# Patient Record
Sex: Male | Born: 1935 | Race: White | Hispanic: No | Marital: Married | Smoking: Former smoker
Health system: Southern US, Community
[De-identification: ages and names within clinical notes are randomized; demographics above are authoritative.]

## PROBLEM LIST (undated history)

## (undated) DIAGNOSIS — F329 Major depressive disorder, single episode, unspecified: Secondary | ICD-10-CM

## (undated) DIAGNOSIS — I442 Atrioventricular block, complete: Secondary | ICD-10-CM

## (undated) DIAGNOSIS — H25019 Cortical age-related cataract, unspecified eye: Secondary | ICD-10-CM

## (undated) DIAGNOSIS — G629 Polyneuropathy, unspecified: Secondary | ICD-10-CM

## (undated) DIAGNOSIS — G20 Parkinson's disease: Secondary | ICD-10-CM

## (undated) DIAGNOSIS — M2040 Other hammer toe(s) (acquired), unspecified foot: Secondary | ICD-10-CM

## (undated) DIAGNOSIS — G259 Extrapyramidal and movement disorder, unspecified: Secondary | ICD-10-CM

## (undated) DIAGNOSIS — G609 Hereditary and idiopathic neuropathy, unspecified: Secondary | ICD-10-CM

## (undated) DIAGNOSIS — M1990 Unspecified osteoarthritis, unspecified site: Secondary | ICD-10-CM

## (undated) DIAGNOSIS — F419 Anxiety disorder, unspecified: Secondary | ICD-10-CM

## (undated) DIAGNOSIS — R251 Tremor, unspecified: Secondary | ICD-10-CM

## (undated) DIAGNOSIS — R413 Other amnesia: Secondary | ICD-10-CM

## (undated) DIAGNOSIS — K219 Gastro-esophageal reflux disease without esophagitis: Secondary | ICD-10-CM

## (undated) DIAGNOSIS — Z8659 Personal history of other mental and behavioral disorders: Secondary | ICD-10-CM

## (undated) DIAGNOSIS — I82409 Acute embolism and thrombosis of unspecified deep veins of unspecified lower extremity: Secondary | ICD-10-CM

## (undated) DIAGNOSIS — M4800 Spinal stenosis, site unspecified: Secondary | ICD-10-CM

## (undated) DIAGNOSIS — M659 Unspecified synovitis and tenosynovitis, unspecified site: Secondary | ICD-10-CM

## (undated) DIAGNOSIS — I10 Essential (primary) hypertension: Secondary | ICD-10-CM

## (undated) DIAGNOSIS — Z5189 Encounter for other specified aftercare: Secondary | ICD-10-CM

## (undated) DIAGNOSIS — F32A Depression, unspecified: Secondary | ICD-10-CM

## (undated) DIAGNOSIS — B019 Varicella without complication: Secondary | ICD-10-CM

## (undated) DIAGNOSIS — G20A1 Parkinson's disease without dyskinesia, without mention of fluctuations: Secondary | ICD-10-CM

## (undated) DIAGNOSIS — Z950 Presence of cardiac pacemaker: Secondary | ICD-10-CM

## (undated) DIAGNOSIS — IMO0001 Reserved for inherently not codable concepts without codable children: Secondary | ICD-10-CM

## (undated) DIAGNOSIS — R0602 Shortness of breath: Secondary | ICD-10-CM

## (undated) DIAGNOSIS — G709 Myoneural disorder, unspecified: Secondary | ICD-10-CM

## (undated) DIAGNOSIS — D689 Coagulation defect, unspecified: Secondary | ICD-10-CM

## (undated) DIAGNOSIS — G3184 Mild cognitive impairment, so stated: Secondary | ICD-10-CM

## (undated) HISTORY — DX: Varicella without complication: B01.9

## (undated) HISTORY — DX: Extrapyramidal and movement disorder, unspecified: G25.9

## (undated) HISTORY — DX: Coagulation defect, unspecified: D68.9

## (undated) HISTORY — DX: Essential (primary) hypertension: I10

## (undated) HISTORY — PX: REPLACEMENT TOTAL HIP W/  RESURFACING IMPLANTS: SUR1222

## (undated) HISTORY — DX: Unspecified synovitis and tenosynovitis, unspecified site: M65.90

## (undated) HISTORY — DX: Other hammer toe(s) (acquired), unspecified foot: M20.40

## (undated) HISTORY — DX: Cortical age-related cataract, unspecified eye: H25.019

## (undated) HISTORY — DX: Major depressive disorder, single episode, unspecified: F32.9

## (undated) HISTORY — DX: Personal history of other mental and behavioral disorders: Z86.59

## (undated) HISTORY — DX: Atrioventricular block, complete: I44.2

## (undated) HISTORY — DX: Parkinson's disease: G20

## (undated) HISTORY — DX: Polyneuropathy, unspecified: G62.9

## (undated) HISTORY — DX: Hereditary and idiopathic neuropathy, unspecified: G60.9

## (undated) HISTORY — DX: Depression, unspecified: F32.A

## (undated) HISTORY — DX: Anxiety disorder, unspecified: F41.9

## (undated) HISTORY — DX: Spinal stenosis, site unspecified: M48.00

## (undated) HISTORY — DX: Mild cognitive impairment, so stated: G31.84

## (undated) HISTORY — DX: Other amnesia: R41.3

## (undated) HISTORY — DX: Synovitis and tenosynovitis, unspecified: M65.9

## (undated) HISTORY — DX: Parkinson's disease without dyskinesia, without mention of fluctuations: G20.A1

## (undated) HISTORY — DX: Gastro-esophageal reflux disease without esophagitis: K21.9

## (undated) HISTORY — PX: VASECTOMY: SHX75

## (undated) HISTORY — PX: KNEE ARTHROSCOPY: SUR90

## (undated) HISTORY — PX: JOINT REPLACEMENT: SHX530

## (undated) HISTORY — DX: Tremor, unspecified: R25.1

## (undated) HISTORY — PX: KNEE SURGERY: SHX244

## (undated) HISTORY — PX: CORRECTION HAMMER TOE: SUR317

## (undated) HISTORY — DX: Unspecified osteoarthritis, unspecified site: M19.90

## (undated) HISTORY — DX: Acute embolism and thrombosis of unspecified deep veins of unspecified lower extremity: I82.409

---

## 1995-12-13 HISTORY — PX: REPLACEMENT TOTAL KNEE BILATERAL: SUR1225

## 1999-12-13 HISTORY — PX: UMBILICAL HERNIA REPAIR: SUR1181

## 2005-06-08 ENCOUNTER — Ambulatory Visit: Payer: Self-pay | Admitting: Ophthalmology

## 2005-06-11 HISTORY — PX: CATARACT EXTRACTION: SUR2

## 2005-06-13 ENCOUNTER — Ambulatory Visit: Payer: Self-pay | Admitting: Ophthalmology

## 2005-08-01 ENCOUNTER — Ambulatory Visit: Payer: Self-pay | Admitting: Ophthalmology

## 2005-08-08 ENCOUNTER — Ambulatory Visit: Payer: Self-pay | Admitting: Ophthalmology

## 2006-06-13 ENCOUNTER — Ambulatory Visit: Payer: Self-pay | Admitting: Gastroenterology

## 2006-06-13 HISTORY — PX: COLONOSCOPY: SHX174

## 2007-12-13 HISTORY — PX: TOTAL HIP ARTHROPLASTY: SHX124

## 2008-02-28 ENCOUNTER — Ambulatory Visit: Payer: Self-pay | Admitting: Orthopaedic Surgery

## 2008-06-11 HISTORY — PX: ROTATOR CUFF REPAIR: SHX139

## 2008-06-12 ENCOUNTER — Ambulatory Visit: Payer: Self-pay | Admitting: Orthopaedic Surgery

## 2008-06-12 ENCOUNTER — Other Ambulatory Visit: Payer: Self-pay

## 2008-06-19 ENCOUNTER — Ambulatory Visit: Payer: Self-pay | Admitting: Orthopaedic Surgery

## 2008-06-19 HISTORY — PX: SHOULDER ARTHROSCOPY: SHX128

## 2008-12-12 HISTORY — PX: ROTATOR CUFF REPAIR: SHX139

## 2009-01-02 ENCOUNTER — Encounter: Payer: Self-pay | Admitting: Internal Medicine

## 2009-01-06 ENCOUNTER — Ambulatory Visit: Payer: Self-pay | Admitting: Internal Medicine

## 2009-01-12 ENCOUNTER — Encounter: Payer: Self-pay | Admitting: Internal Medicine

## 2009-01-19 ENCOUNTER — Encounter: Payer: Self-pay | Admitting: Internal Medicine

## 2009-02-09 ENCOUNTER — Encounter: Payer: Self-pay | Admitting: Internal Medicine

## 2010-12-20 ENCOUNTER — Encounter: Payer: Self-pay | Admitting: Cardiovascular Disease

## 2010-12-23 ENCOUNTER — Encounter: Payer: Self-pay | Admitting: Cardiovascular Disease

## 2011-01-07 ENCOUNTER — Ambulatory Visit: Payer: Self-pay | Admitting: Podiatry

## 2011-01-18 ENCOUNTER — Encounter: Payer: Self-pay | Admitting: Cardiovascular Disease

## 2011-03-24 ENCOUNTER — Encounter: Payer: Self-pay | Admitting: Cardiovascular Disease

## 2011-03-24 ENCOUNTER — Ambulatory Visit (INDEPENDENT_AMBULATORY_CARE_PROVIDER_SITE_OTHER): Payer: Medicare Other | Admitting: Cardiovascular Disease

## 2011-03-24 VITALS — BP 124/78 | HR 80 | Ht 71.0 in | Wt 201.8 lb

## 2011-03-24 DIAGNOSIS — R413 Other amnesia: Secondary | ICD-10-CM | POA: Insufficient documentation

## 2011-03-24 DIAGNOSIS — F329 Major depressive disorder, single episode, unspecified: Secondary | ICD-10-CM | POA: Insufficient documentation

## 2011-03-24 DIAGNOSIS — I10 Essential (primary) hypertension: Secondary | ICD-10-CM

## 2011-03-24 HISTORY — DX: Other amnesia: R41.3

## 2011-03-24 MED ORDER — AMLODIPINE-VALSARTAN-HCTZ 5-160-25 MG PO TABS: ORAL_TABLET | ORAL | Status: DC

## 2011-03-24 NOTE — Progress Notes (Signed)
   Patient ID: GERY SABEDRA, male    DOB: Jul 15, 1934, 77 y.o.   MRN: [id number]  HPI Comments: Mr. Janoski is a very pleasant 77 year old gentleman with a long history of hypertension, possible depression and anxiety, tremor, difficulty tolerating various blood pressure medications secondary to side effects who presents for referral from Dr. Burnett Sheng for further evaluation of his blood pressure.  Mr. Cloke states that currently he feels very tired and like he is in a "fog". The "fog" started when he began taking the antidepressant medicine. Initially the generic medication caused nightmares. The brand name seems to be doing better though he does not feel himself. His wife reports that he has one good day and then one bad day alternating pattern. He has disrupted sleep and wakes at 3 in the morning with things on his mind. He complains of decreased leg strength and decreased muscle mass in his legs for no particular reason.  He has had a surgery earlier this year for his toe and his wife feels that he has had a change in his personality since that time with memory issues among other things, including possibly depression.   He believes that his blood pressure medications could be contributing to his symptoms though he is unsure and cannot exclude the antidepressant.  EKG shows normal sinus rhythm with right bundle branch block, left axis deviation, 80 beats per minute     Review of Systems  Constitutional: Positive for fatigue.  HENT: Negative.   Eyes: Negative.   Respiratory: Negative.   Cardiovascular: Negative.   Gastrointestinal: Negative.   Musculoskeletal: Negative.   Skin: Negative.   Neurological: Positive for tremors and weakness.  Hematological: Negative.   Psychiatric/Behavioral: Positive for sleep disturbance and dysphoric mood.       Memory problems    BP 124/78  Pulse 80  Ht 5\' 11"  (1.803 m)  Wt 201 lb 12.8 oz (91.536 kg)  BMI 28.15 kg/m2   Physical Exam  Nursing  note and vitals reviewed. Constitutional: He is oriented to person, place, and time. He appears well-developed and well-nourished.  HENT:  Head: Normocephalic.  Nose: Nose normal.  Mouth/Throat: Oropharynx is clear and moist.  Eyes: Conjunctivae are normal. Pupils are equal, round, and reactive to light.  Neck: Normal range of motion. Neck supple. No JVD present.  Cardiovascular: Normal rate, regular rhythm, S1 normal, S2 normal, normal heart sounds and intact distal pulses.  Exam reveals no gallop and no friction rub.   No murmur heard. Pulmonary/Chest: Effort normal and breath sounds normal. No respiratory distress. He has no wheezes. He has no rales. He exhibits no tenderness.  Abdominal: Soft. Bowel sounds are normal. He exhibits no distension. There is no tenderness.  Musculoskeletal: Normal range of motion. He exhibits no edema and no tenderness.  Lymphadenopathy:    He has no cervical adenopathy.  Neurological: He is alert and oriented to person, place, and time. Coordination normal.  Skin: Skin is warm and dry. No rash noted. No erythema.  Psychiatric: He has a normal mood and affect. His behavior is normal. Judgment and thought content normal.           Assessment and Plan

## 2011-03-24 NOTE — Assessment & Plan Note (Signed)
His wife has not noticed a significant improvement on his antidepressant. I asked him to talk with Dr. Burnett Sheng concerning any medication changes

## 2011-03-24 NOTE — Assessment & Plan Note (Signed)
Per his report, he is unable to tolerate ACE inhibitors. He has tried several of the ARB's and the one that he likes the best, Benicar, he cannot afford and his insurance will cover.  We have suggested that we had him try various medications for short periods of time to see if this would help his general disposition. We have samples of exforge 5/160/25 mg that we will try for several weeks. He will monitor his blood pressure. This would give [place] a different calcium channel blocker, different ARB.  If this does not work, we could try tekturna, hydralazine, clonidine, among other agents.

## 2011-03-24 NOTE — Assessment & Plan Note (Signed)
I am concerned about a general neurologic issue that may try everything together. He has been told that he does not have Parkinson's. I'm uncertain if other similar conditions could be giving him a mixed presentation such as Lewy body disease. He does have some motor issues, memory deficits, sleep disturbance, tremor, underlying mild mood disorder.   His wife seems to think that recent anesthesia in January has progressed his condition and this is also common with the above. I will defer to Dr. Sandria Manly and Dr. Burnett Sheng. I have certainly made it clear to them that his presentation could be from medications alone with no underlying neurologic issue.

## 2011-03-24 NOTE — Patient Instructions (Addendum)
Continue to monitor blood pressure and heart rate with the new medication Exforge HCT 5/160/25 take one tablet daily. The patient will call with blood pressure readings and for a follow up appointment. Samples given of Exforge HCT 5/160/25 mg.

## 2011-06-06 ENCOUNTER — Telehealth: Payer: Self-pay

## 2011-06-06 NOTE — Telephone Encounter (Signed)
We may have samples. Edema is from exforge. If it gets worse, we could try to change medication

## 2011-06-06 NOTE — Telephone Encounter (Signed)
The patient was given sample of amlodipine valsartan HCTZ  5-160-25mg  to try.  He is going to need either a Rx or more samples depending on what Dr. Mariah Milling recommends.  He has noticed some swelling in ankles & feet for a couple of weeks; has taken Hydrochlorothiazide 12.5 mg for three mornings.  The swelling is okay at night but by afternoon he is swelling again.  His blood pressure has been reading  6/12 142/79 HR 57 9:45 am, 6/14 129/64 Hr 78 @ 12:45 pm,  6/16 114/ 64  HR 64 10:30 pm,  144/66, 107/58 HR 74 1:30 pm  116/64 HR 63 @ 10:40 pm. Is off the pristiq started skipping on May 30.  FYI:  He only has enough exforge for this week.  Please call ASAP for what to do.   Please call with what to do at  [phone]

## 2011-06-07 ENCOUNTER — Telehealth: Payer: Self-pay

## 2011-06-07 NOTE — Telephone Encounter (Signed)
Notified patient we have Exforge 5mg /160mg /12.5mg  samples that he could take with his hydrochlorothiazide 12.5mg  he has at home to equal the correct amount of Exforge 5mg /160/g/25mg .  The patient states the hctz 12.5 mg was given to him after a surgery he had and the hctz has expired.  Do you want to send for another Rx of HCTZ 12.5 mg until we decide if the Exforge is going to work for him.  He states the swelling has improved with the extra dose of HCTZ 12.5mg  with his Exforge 5mg /160mg /25mg . Please advise what to do.

## 2011-06-10 ENCOUNTER — Telehealth: Payer: Self-pay | Admitting: Cardiovascular Disease

## 2011-06-10 MED ORDER — HYDROCHLOROTHIAZIDE 25 MG PO TABS: ORAL_TABLET | ORAL | Status: DC

## 2011-06-10 NOTE — Telephone Encounter (Signed)
Ok to give samples of exforge HCT 04/27/11.5 daily with HCTZ 25 prn for edema.

## 2011-06-10 NOTE — Telephone Encounter (Signed)
Notified patient have samples of Exforge HCT 5mg /160mg /12.5mg  with an additional Rx sent for Hydrochlorothiazide 25mg  one tablet daily.

## 2011-06-10 NOTE — Telephone Encounter (Signed)
Patient notified

## 2011-06-10 NOTE — Telephone Encounter (Signed)
Notified patient need to come pick up samples of exforge HCT 5/160/12.5mg  with HCTZ 25mg  prn for edema.  A Rx will be sent to local pharmacy for the HCTZ 25 mg.

## 2011-06-10 NOTE — Telephone Encounter (Signed)
Pt was expecting a call from Jasmine December in regards to a new medication that they were trying on him.  He only has enough to last through Tuesday.  Pt does not know whether or not the physician wishes for him to continue the medication.

## 2011-07-05 ENCOUNTER — Telehealth: Payer: Self-pay

## 2011-07-05 NOTE — Telephone Encounter (Signed)
Patient would like to know if should continue with the same dose of Exforge or change to another medication.

## 2011-07-06 ENCOUNTER — Telehealth: Payer: Self-pay | Admitting: *Deleted

## 2011-07-06 NOTE — Telephone Encounter (Signed)
Pt calling to find out if he should continue same dosage of Exforge or change to different medication? His BP in AM is 130s-140/~75 but throughout day it is 110/60s and he feels weak. Pt also still has incr swelling in legs. Please advise.  Here is previous message:  Bishop Dublin, [facility] 06/10/2011 4:55 PM Signed  Notified patient have samples of Exforge HCT 5mg /160mg /12.5mg  with an additional Rx sent for Hydrochlorothiazide 25mg  one tablet daily. Julien Nordmann, MD 06/06/2011 11:22 PM Signed  We may have samples.  Edema is from exforge.  If it gets worse, we could try to change medication Bishop Dublin, [facility] 06/06/2011 10:28 AM Signed  The patient was given sample of amlodipine valsartan HCTZ 5-160-25mg  to try. He is going to need either a Rx or more samples depending on what Dr. Mariah Milling recommends. He has noticed some swelling in ankles & feet for a couple of weeks; has taken Hydrochlorothiazide 12.5 mg for three mornings. The swelling is okay at night but by afternoon he is swelling again. His blood pressure has been reading 6/12 142/79 HR 57 9:45 am, 6/14 129/64 Hr 78 @ 12:45 pm, 6/16 114/ 64 HR 64 10:30 pm, 144/66, 107/58 HR 74 1:30 pm 116/64 HR 63 @ 10:40 pm. Is off the pristiq started skipping on May 30. FYI: He only has enough exforge for this week. Please call ASAP for what to do.

## 2011-07-07 NOTE — Telephone Encounter (Signed)
Could try 1/2 in Am and 1/2 in PM to level out BP

## 2011-07-08 NOTE — Telephone Encounter (Signed)
Spoke to pt, he will try 1/2 tab of exforge hct bid, if swelling does not resolve, will try Diovan hct per Dr. Mariah Milling. Samples for Exforge hct left at front for pt for 1 month supply.

## 2011-08-30 ENCOUNTER — Ambulatory Visit: Payer: Medicare Other | Admitting: Cardiovascular Disease

## 2011-09-09 ENCOUNTER — Encounter: Payer: Self-pay | Admitting: Family Medicine

## 2011-09-12 ENCOUNTER — Encounter: Payer: Self-pay | Admitting: Cardiovascular Disease

## 2011-09-12 ENCOUNTER — Ambulatory Visit (INDEPENDENT_AMBULATORY_CARE_PROVIDER_SITE_OTHER): Payer: Medicare Other | Admitting: Cardiovascular Disease

## 2011-09-12 DIAGNOSIS — F329 Major depressive disorder, single episode, unspecified: Secondary | ICD-10-CM

## 2011-09-12 DIAGNOSIS — I10 Essential (primary) hypertension: Secondary | ICD-10-CM

## 2011-09-12 NOTE — Assessment & Plan Note (Signed)
Blood pressure was well controlled on a full tablet of exforge. On the half tablet, his blood pressure has been mildly elevated. We have suggested he  on the full tablet or take  one half b.i.d..

## 2011-09-12 NOTE — Patient Instructions (Signed)
You are doing well. No medication changes were made. Please call us if you have new issues that need to be addressed before your next appt.  We will call you for a follow up Appt. In 12 months  

## 2011-09-12 NOTE — Progress Notes (Signed)
Patient ID: Randy [name], male    DOB: 1934-06-15, 77 y.o.   MRN: [id number]  HPI Comments: Randy [name] is a very pleasant 77 year old gentleman with a long history of hypertension, possible depression and anxiety, tremor, difficulty tolerating various blood pressure medications secondary to side effects, Patient of Dr. Burnett Sheng, who presents for routine followup.  He reports that he has been closely monitoring his blood pressure. He felt that on a full pill of exforge 04/26/24 milligrams his blood pressure was too low. He was changed to the 04/27/11.5 when we did not have any samples of the former. On this, he still feels his blood pressure was too low and he started cutting the samples in  half. On the half tablet, his blood pressure has been running mildly high with systolic pressures in the 150s, sometimes the 160s. He was recommended by Dr. Burnett Sheng to take the pill one half b.i.d.. He has not been doing this yet.  Otherwise he feels well. He was started back on his antidepressant medication and believes that his sleeping hygiene has improved. His wife reports he continues to have some vivid dreams though they are less violent than before. He has been trying to walk on a regular basis. Overall feels much better.  Old EKG shows normal sinus rhythm with right bundle branch block, left axis deviation, 80 beats per minute  Outpatient Encounter Prescriptions as of 09/12/2011  Medication Sig Dispense Refill  . Amlodipine-Valsartan-HCTZ (EXFORGE HCT) 5-160-12.5 MG TABS Take 1/2 tablet daily.      Marland Kitchen aspirin 81 MG EC tablet Take 81 mg by mouth daily.        Marland Kitchen desvenlafaxine (PRISTIQ) 50 MG 24 hr tablet Take 50 mg by mouth daily.          Review of Systems  HENT: Negative.   Eyes: Negative.   Respiratory: Negative.   Cardiovascular: Negative.   Gastrointestinal: Negative.   Musculoskeletal: Negative.   Skin: Negative.   Neurological: Positive for tremors and weakness.  Hematological:  Negative.   Psychiatric/Behavioral: Positive for sleep disturbance and dysphoric mood.       Memory problems  All other systems reviewed and are negative.    BP 128/82  Pulse 71  Ht 6' (1.829 m)  Wt 204 lb (92.534 kg)  BMI 27.67 kg/m2   Physical Exam  Nursing note and vitals reviewed. Constitutional: He is oriented to person, place, and time. He appears well-developed and well-nourished.  HENT:  Head: Normocephalic.  Nose: Nose normal.  Mouth/Throat: Oropharynx is clear and moist.  Eyes: Conjunctivae are normal. Pupils are equal, round, and reactive to light.  Neck: Normal range of motion. Neck supple. No JVD present.  Cardiovascular: Normal rate, regular rhythm, S1 normal, S2 normal, normal heart sounds and intact distal pulses.  Exam reveals no gallop and no friction rub.   No murmur heard. Pulmonary/Chest: Effort normal and breath sounds normal. No respiratory distress. He has no wheezes. He has no rales. He exhibits no tenderness.  Abdominal: Soft. Bowel sounds are normal. He exhibits no distension. There is no tenderness.  Musculoskeletal: Normal range of motion. He exhibits no edema and no tenderness.  Lymphadenopathy:    He has no cervical adenopathy.  Neurological: He is alert and oriented to person, place, and time. Coordination normal.  Skin: Skin is warm and dry. No rash noted. No erythema.  Psychiatric: He has a normal mood and affect. His behavior is normal. Judgment and thought content normal.  Assessment and Plan

## 2011-09-12 NOTE — Assessment & Plan Note (Signed)
He reports that his depression has improved on his current medication regimen. He is exercising on a more frequent basis. He does appear very alert and with little confusion today.

## 2011-12-12 ENCOUNTER — Ambulatory Visit: Payer: Medicare Other | Admitting: Cardiovascular Disease

## 2012-04-02 ENCOUNTER — Telehealth: Payer: Self-pay | Admitting: Cardiovascular Disease

## 2012-04-02 NOTE — Telephone Encounter (Signed)
Notified patient's wife per Dr. Kirke Corin need to come for an appointment tomorrow with Dr. Elease Hashimoto for evaluation of shortness of breath and the decrease heart rate.

## 2012-04-02 NOTE — Telephone Encounter (Signed)
Pt complaints are SOB with minimal exertion and weakness. Pt states only change has been increase in gabapentin by Dr Sandria Manly.

## 2012-04-02 NOTE — Telephone Encounter (Signed)
I asked wife to check a radial pulse. She stated she was unable to get radial pulse because it was slow but did check a carotid pulse and it was in the high 30's. I will forward to Dr Kirke Corin for review. Asher Muir in the [place] office notified that I have spoken with pt and wife and I am forwarding this to Dr Kirke Corin.

## 2012-04-02 NOTE — Telephone Encounter (Signed)
Spoke with pt and pt's wife. Pt hasn't felt good for about 2 weeks. Pt did was advised by Dr Sandria Manly last week to double gabapentin due to hip pain. Pt states no change in hip pain since increasing gabapentin. Pt worked outside all day last Wednesday abd that's when he really started feeling bad. BP and pulse readings since 03/28/12 154/68  41  03/29/12 129/61   41   03/30/12 165/69   41   03/31/12 two readings 156/65   39  153/71 37   04/01/12 two  readings 132/59 40 162/67 35 04/02/12 185/69 40.

## 2012-04-02 NOTE — Telephone Encounter (Signed)
Pt wife calling states that her husband pulse has been running LOW. This am it was 185/69 pulse 40 Yesterday it was 162/67 pulse 35. Pulse 35-41 since last Wednesday. Pt is in a lot of pain from his leg and hip they think that this maybe the cause of the BP. Pt is on pain meds for the pain.

## 2012-04-03 ENCOUNTER — Encounter: Payer: Self-pay | Admitting: Internal Medicine

## 2012-04-03 ENCOUNTER — Ambulatory Visit (INDEPENDENT_AMBULATORY_CARE_PROVIDER_SITE_OTHER): Payer: Medicare Other | Admitting: Cardiovascular Disease

## 2012-04-03 ENCOUNTER — Encounter (HOSPITAL_COMMUNITY): Payer: Self-pay | Admitting: Pharmacy Technician

## 2012-04-03 ENCOUNTER — Ambulatory Visit (INDEPENDENT_AMBULATORY_CARE_PROVIDER_SITE_OTHER): Payer: Medicare Other | Admitting: Internal Medicine

## 2012-04-03 ENCOUNTER — Encounter: Payer: Self-pay | Admitting: *Deleted

## 2012-04-03 VITALS — BP 152/64 | HR 36 | Ht 66.0 in | Wt 208.2 lb

## 2012-04-03 VITALS — BP 130/60 | HR 39 | Ht 66.0 in | Wt 208.0 lb

## 2012-04-03 DIAGNOSIS — I442 Atrioventricular block, complete: Secondary | ICD-10-CM

## 2012-04-03 DIAGNOSIS — R001 Bradycardia, unspecified: Secondary | ICD-10-CM

## 2012-04-03 DIAGNOSIS — I498 Other specified cardiac arrhythmias: Secondary | ICD-10-CM

## 2012-04-03 DIAGNOSIS — I10 Essential (primary) hypertension: Secondary | ICD-10-CM

## 2012-04-03 DIAGNOSIS — Z01818 Encounter for other preprocedural examination: Secondary | ICD-10-CM

## 2012-04-03 MED ORDER — SODIUM CHLORIDE 0.45 % IV SOLN: INTRAVENOUS | Status: DC

## 2012-04-03 MED ORDER — CEFAZOLIN SODIUM-DEXTROSE 2-3 GM-% IV SOLR
2.0000 g | INTRAVENOUS | Status: DC
  Filled 2012-04-03: qty 50

## 2012-04-03 MED ORDER — SODIUM CHLORIDE 0.9 % IR SOLN
80.0000 mg | Status: DC
  Filled 2012-04-03: qty 2

## 2012-04-03 MED ORDER — SODIUM CHLORIDE 0.9 % IV SOLN
INTRAVENOUS | Status: DC
  Administered 2012-04-04: 11:00:00 via INTRAVENOUS

## 2012-04-03 NOTE — Assessment & Plan Note (Addendum)
Randy [name] presents today for followup of bradycardia and generalized weakness. His EKG today shows complete heart block with a ventricular escape rhythm at 36 beats a minute. He's not had any medication changes. He's not on any beta blockers or calcium blockers. I think we for him to see EP for pacemaker placement.  I answered several of his questions. She will return to see Dr. Graciela Husbands this afternoon at 3:45.  We will need some labs drawn today. We'll check a TSH, complete metabolic profile, PT/INR, and CBC.

## 2012-04-03 NOTE — Assessment & Plan Note (Signed)
His BP is stable.  Continue current meds

## 2012-04-03 NOTE — Progress Notes (Signed)
 History and Physical  Patient ID: Randy [name] MRN: [id number], SOB: 02/11/1934 78 y.o. Date of Encounter: 04/03/2012, 5:09 PM  Primary Physician: HEDRICK,JAMES, MD, MD Primary Cardiologist: PNAH Primary Electrophysiologist:  GT/SK  Chief Complaint: fatigue  History of Present Illness: Randy [name] is a 78 y.o. male seen as an urgent request at the request of Dr. PNahser because of complete heart block.  He has noted increasing exercise intolerance over the last couple of weeks. He had an episode of this a couple of weeks before. With this he has noted that his heart rate rather than be in the 80s has been in the 30s and 40s. There has been no lightheadedness and no syncope. He denies chest pain.  He has no prior cardiac history. He denies MI shortness of breath; he does have hypertension and some peripheral edema.  Exercise tolerance is also been limited by pain in his hips. He has a history of knee replacement and hip are placed at surgery.     Past Medical History  Diagnosis Date  . Hypertension   . Depression   . Anxiety   . Tremor   . Memory deficit 03/24/2011     Past Surgical History  Procedure Date  . Knee surgery 1974,1979,1996  . Replacement total knee bilateral 1997  . Umbilical hernia repair 2001  . Cataract extraction 06/2005  . Rotator cuff repair 06/2008  . Total hip arthroplasty 2009    right      Current Outpatient Prescriptions  Medication Sig Dispense Refill  . Amlodipine-Valsartan-HCTZ (EXFORGE HCT) 5-160-12.5 MG TABS 100 mg. Taking 1/2 in the morning and 1/2 at night.      . aspirin 81 MG EC tablet Take 81 mg by mouth daily.        . desvenlafaxine (PRISTIQ) 50 MG 24 hr tablet Take 50 mg by mouth daily.        . gabapentin (NEURONTIN) 100 MG capsule Take 100 mg by mouth 3 (three) times daily.         Allergies: Allergies  Allergen Reactions  . Codeine   . Lodine (Etodolac)   . Percocet (Oxycodone-Acetaminophen)       History  Substance Use Topics  . Smoking status: Former Smoker -- 1.0 packs/day for 18 years    Types: Cigarettes    Quit date: 03/20/1981  . Smokeless tobacco: Never Used  . Alcohol Use: Yes     occasional      Family History  Problem Relation Age of Onset  . Hypertension Mother   . Hypertension Father   . Seizures Father     epilepsy  . Prostate cancer Other   . Cancer Other   . Hypertension Other   . Asthma Other       ROS:  Please see the history of present illness.     All other systems reviewed and negative.   Vital Signs: Blood pressure 130/60, pulse 39, height 5' 6" (1.676 m), weight 208 lb (94.348 kg).  PHYSICAL EXAM: General:  Well nourished, well developed male in no acute distress HEENT: normal Lymph: no adenopathy Neck: no JVD Endocrine:  No thryomegaly Vascular: No carotid bruits; FA pulses 2+ bilaterally without bruits Cardiac:  Slow but quiet S1, S2; RRR; no murmur Back: without kyphosis/scoliosis, no CVA tenderness Lungs:  clear to auscultation bilaterally, no wheezing, rhonchi or rales Abd: soft, nontender, no hepatomegaly Ext: no edema Musculoskeletal:  No deformities, BUE and BLE strength normal and equal Skin: warm   and dry Neuro:  CNs 2-12 intact, no focal abnormalities noted Psych:  Normal affect   EKG:  Complete heart block with right bundle branch block escape  Labs:    ASSESSMENT AND PLAN:        

## 2012-04-03 NOTE — Patient Instructions (Addendum)
You will need to arrive a [facility] at 9:00 am for a pacemaker implant, Echo and lab work.

## 2012-04-03 NOTE — Progress Notes (Signed)
[place] Office [address], [address] [place], [place]  [address]  [place] Office [address] [place], [place]  [address]  Randy [name] Date of Birth  [birth year]  History of Present Illness:  Randy [name] is a 18 her old gentleman with a history of hypertension. He presents today for a work in visit for episodes of bradycardia.  He's noted that his heart rate has been slow for the past 2 weeks.  These episodes have been associated  weakness and fatigue. He denies any episodes of syncope or presyncope. He denies any chest pain or dyspnea.    This bradycardia at a sudden onset. He came without warning. He's been very fatigued since that time. He measures his vital signs everyday and has noticed that his heart rate is in the 30s and 40s almost constantly.  He is limited by pain in his left hip and knee area. He's been tried on gabapentin for that pain. He's not sure if that has helped all. He seems to get a lot of relief with Motrin.   Current Outpatient Prescriptions on File Prior to Visit  Medication Sig Dispense Refill  . Amlodipine-Valsartan-HCTZ (EXFORGE HCT) 5-160-12.5 MG TABS 100 mg. Taking 1/2 in the morning and 1/2 at night.      Marland Kitchen aspirin 81 MG EC tablet Take 81 mg by mouth daily.        Marland Kitchen desvenlafaxine (PRISTIQ) 50 MG 24 hr tablet Take 50 mg by mouth daily.        Marland Kitchen gabapentin (NEURONTIN) 100 MG capsule Take 100 mg by mouth 3 (three) times daily.        Allergies  Allergen Reactions  . Codeine   . Lodine (Etodolac)   . Percocet (Oxycodone-Acetaminophen)     Past Medical History  Diagnosis Date  . Hypertension   . Depression   . Anxiety   . Tremor   . Memory deficit 03/24/2011    Past Surgical History  Procedure Date  . Knee surgery [phone]  . Replacement total knee bilateral 1997  . Umbilical hernia repair 2001  . Cataract extraction 06/2005  . Rotator cuff repair 06/2008  . Total hip arthroplasty 2009    right     History  Smoking status  . Former Smoker -- 1.0 packs/day for 18 years  . Types: Cigarettes  . Quit date: 03/20/1981  Smokeless tobacco  . Never Used    History  Alcohol Use  . Yes    occasional    Family History  Problem Relation Age of Onset  . Hypertension Mother   . Hypertension Father   . Seizures Father     epilepsy  . Prostate cancer Other   . Cancer Other   . Hypertension Other   . Asthma Other     Reviw of Systems:  Reviewed in the HPI.  All other systems are negative.  Physical Exam: Blood pressure 152/64, pulse 52, height 5\' 6"  (1.676 m), weight 208 lb 4 oz (94.462 kg). General: Well developed, well nourished, in no acute distress.  Head: Normocephalic, atraumatic, sclera non-icteric, mucus membranes are moist,   Neck: Supple. Carotids are 2 + without bruits. No JVD  Lungs: Clear bilaterally to auscultation.  Heart: regular rate.  normal  S1 S2. No murmurs, gallops or rubs.  His HR is very slow.  Abdomen: Soft, non-tender, non-distended with normal bowel sounds. No hepatomegaly. No rebound/guarding. No masses.  Msk:  Strength and tone are normal  Extremities: No clubbing or cyanosis. No edema.  Distal pedal pulses are 2+ and equal bilaterally.  Neuro: Alert and oriented X 3. Moves all extremities spontaneously.  Psych:  Responds to questions appropriately with a normal affect.  ECG: 04/03/2012 --Normal sinus rhythm. He has complete heart block. The ventricular rate is 36.  Assessment / Plan:

## 2012-04-03 NOTE — Assessment & Plan Note (Signed)
The patient has complete heart block. Because of unknown stability of escape rhythm, I have stressed that it is urgent to proceed with pacing. I discussed the potential benefits as well as risks including but not limited to perforation infection he does live and he understands these risks and is willing to proceed

## 2012-04-04 ENCOUNTER — Encounter (HOSPITAL_COMMUNITY): Payer: Self-pay | Admitting: General Practice

## 2012-04-04 ENCOUNTER — Ambulatory Visit (HOSPITAL_COMMUNITY): Payer: Medicare Other

## 2012-04-04 ENCOUNTER — Encounter (HOSPITAL_COMMUNITY)
Admission: RE | Disposition: A | Discharge status: Home / Self Care | Payer: Self-pay | Source: Ambulatory Visit | Attending: Internal Medicine

## 2012-04-04 ENCOUNTER — Inpatient Hospital Stay (HOSPITAL_COMMUNITY)
Admission: RE | Admit: 2012-04-04 | Discharge: 2012-04-07 | DRG: 243 | Disposition: A | Discharge status: Home / Self Care | Payer: Medicare Other | Source: Ambulatory Visit | Attending: Internal Medicine | Admitting: Internal Medicine

## 2012-04-04 DIAGNOSIS — Z96659 Presence of unspecified artificial knee joint: Secondary | ICD-10-CM

## 2012-04-04 DIAGNOSIS — Z9849 Cataract extraction status, unspecified eye: Secondary | ICD-10-CM

## 2012-04-04 DIAGNOSIS — F411 Generalized anxiety disorder: Secondary | ICD-10-CM | POA: Diagnosis present

## 2012-04-04 DIAGNOSIS — Z7982 Long term (current) use of aspirin: Secondary | ICD-10-CM

## 2012-04-04 DIAGNOSIS — Z888 Allergy status to other drugs, medicaments and biological substances status: Secondary | ICD-10-CM

## 2012-04-04 DIAGNOSIS — R259 Unspecified abnormal involuntary movements: Secondary | ICD-10-CM | POA: Diagnosis present

## 2012-04-04 DIAGNOSIS — I359 Nonrheumatic aortic valve disorder, unspecified: Secondary | ICD-10-CM

## 2012-04-04 DIAGNOSIS — Z79899 Other long term (current) drug therapy: Secondary | ICD-10-CM

## 2012-04-04 DIAGNOSIS — F329 Major depressive disorder, single episode, unspecified: Secondary | ICD-10-CM | POA: Diagnosis present

## 2012-04-04 DIAGNOSIS — G609 Hereditary and idiopathic neuropathy, unspecified: Secondary | ICD-10-CM | POA: Diagnosis present

## 2012-04-04 DIAGNOSIS — F3289 Other specified depressive episodes: Secondary | ICD-10-CM | POA: Diagnosis present

## 2012-04-04 DIAGNOSIS — I442 Atrioventricular block, complete: Secondary | ICD-10-CM

## 2012-04-04 DIAGNOSIS — T82190A Other mechanical complication of cardiac electrode, initial encounter: Secondary | ICD-10-CM | POA: Diagnosis not present

## 2012-04-04 DIAGNOSIS — Y831 Surgical operation with implant of artificial internal device as the cause of abnormal reaction of the patient, or of later complication, without mention of misadventure at the time of the procedure: Secondary | ICD-10-CM | POA: Diagnosis not present

## 2012-04-04 DIAGNOSIS — Z01818 Encounter for other preprocedural examination: Secondary | ICD-10-CM

## 2012-04-04 DIAGNOSIS — M549 Dorsalgia, unspecified: Secondary | ICD-10-CM | POA: Diagnosis not present

## 2012-04-04 DIAGNOSIS — Z96649 Presence of unspecified artificial hip joint: Secondary | ICD-10-CM

## 2012-04-04 DIAGNOSIS — I10 Essential (primary) hypertension: Secondary | ICD-10-CM | POA: Diagnosis present

## 2012-04-04 DIAGNOSIS — R079 Chest pain, unspecified: Secondary | ICD-10-CM

## 2012-04-04 DIAGNOSIS — Z87891 Personal history of nicotine dependence: Secondary | ICD-10-CM

## 2012-04-04 DIAGNOSIS — R609 Edema, unspecified: Secondary | ICD-10-CM | POA: Diagnosis present

## 2012-04-04 DIAGNOSIS — M129 Arthropathy, unspecified: Secondary | ICD-10-CM | POA: Diagnosis present

## 2012-04-04 HISTORY — PX: INSERT / REPLACE / REMOVE PACEMAKER: SUR710

## 2012-04-04 HISTORY — DX: Shortness of breath: R06.02

## 2012-04-04 HISTORY — DX: Unspecified osteoarthritis, unspecified site: M19.90

## 2012-04-04 HISTORY — DX: Reserved for inherently not codable concepts without codable children: IMO0001

## 2012-04-04 HISTORY — DX: Myoneural disorder, unspecified: G70.9

## 2012-04-04 HISTORY — DX: Presence of cardiac pacemaker: Z95.0

## 2012-04-04 HISTORY — PX: PERMANENT PACEMAKER INSERTION: SHX5480

## 2012-04-04 HISTORY — PX: OTHER SURGICAL HISTORY: SHX169

## 2012-04-04 HISTORY — DX: Encounter for other specified aftercare: Z51.89

## 2012-04-04 LAB — BASIC METABOLIC PANEL
CO2: 28 mEq/L (ref 19–32)
Calcium: 9.7 mg/dL (ref 8.4–10.5)
Creatinine, Ser: 0.95 mg/dL (ref 0.50–1.35)
GFR calc non Af Amer: 79 mL/min — ABNORMAL LOW (ref >90)
Sodium: 143 mEq/L (ref 135–145)

## 2012-04-04 LAB — PROTIME-INR: Prothrombin Time: 14.2 seconds (ref 11.6–15.2)

## 2012-04-04 LAB — CBC
MCH: 32.3 pg (ref 26.0–34.0)
MCHC: 32.9 g/dL (ref 30.0–36.0)
MCV: 98.2 fL (ref 78.0–100.0)
Platelets: 150 10*3/uL (ref 150–400)
RBC: 4.39 MIL/uL (ref 4.22–5.81)

## 2012-04-04 LAB — SURGICAL PCR SCREEN: Staphylococcus aureus: NEGATIVE

## 2012-04-04 SURGERY — PERMANENT PACEMAKER INSERTION
Anesthesia: LOCAL

## 2012-04-04 MED ORDER — DESVENLAFAXINE SUCCINATE ER 50 MG PO TB24
50.0000 mg | ORAL_TABLET | Freq: Every day | ORAL | Status: DC
  Administered 2012-04-05 – 2012-04-07 (×3): 50 mg via ORAL
  Filled 2012-04-04 (×3): qty 1

## 2012-04-04 MED ORDER — MUPIROCIN 2 % EX OINT
TOPICAL_OINTMENT | Freq: Two times a day (BID) | CUTANEOUS | Status: DC
  Administered 2012-04-04: 1 via NASAL
  Filled 2012-04-04: qty 22

## 2012-04-04 MED ORDER — AMLODIPINE BESYLATE 5 MG PO TABS: 5.0000 mg | ORAL_TABLET | Freq: Every day | ORAL | Status: DC

## 2012-04-04 MED ORDER — AMLODIPINE BESYLATE 2.5 MG PO TABS
2.5000 mg | ORAL_TABLET | Freq: Two times a day (BID) | ORAL | Status: DC
  Filled 2012-04-04: qty 1

## 2012-04-04 MED ORDER — HYDROCODONE-ACETAMINOPHEN 5-325 MG PO TABS
1.0000 | ORAL_TABLET | Freq: Once | ORAL | Status: AC
  Administered 2012-04-04: 1 via ORAL
  Filled 2012-04-04: qty 1

## 2012-04-04 MED ORDER — ACETAMINOPHEN 325 MG PO TABS
325.0000 mg | ORAL_TABLET | ORAL | Status: DC | PRN
  Administered 2012-04-04 – 2012-04-05 (×3): 650 mg via ORAL
  Filled 2012-04-04 (×3): qty 2

## 2012-04-04 MED ORDER — MUPIROCIN 2 % EX OINT
TOPICAL_OINTMENT | CUTANEOUS | Status: AC
  Administered 2012-04-04: 1 via NASAL
  Filled 2012-04-04: qty 22

## 2012-04-04 MED ORDER — IRBESARTAN 150 MG PO TABS: 150.0000 mg | ORAL_TABLET | Freq: Every day | ORAL | Status: DC

## 2012-04-04 MED ORDER — CEFAZOLIN SODIUM-DEXTROSE 2-3 GM-% IV SOLR
INTRAVENOUS | Status: AC
  Filled 2012-04-04: qty 50

## 2012-04-04 MED ORDER — FENTANYL CITRATE 0.05 MG/ML IJ SOLN
INTRAMUSCULAR | Status: AC
  Filled 2012-04-04: qty 2

## 2012-04-04 MED ORDER — CEFAZOLIN SODIUM 1-5 GM-% IV SOLN
1.0000 g | Freq: Four times a day (QID) | INTRAVENOUS | Status: AC
  Administered 2012-04-04 – 2012-04-05 (×3): 1 g via INTRAVENOUS
  Filled 2012-04-04 (×3): qty 50

## 2012-04-04 MED ORDER — LIDOCAINE HCL (PF) 1 % IJ SOLN
INTRAMUSCULAR | Status: AC
  Filled 2012-04-04: qty 60

## 2012-04-04 MED ORDER — CEFAZOLIN SODIUM 1-5 GM-% IV SOLN
INTRAVENOUS | Status: AC
  Filled 2012-04-04: qty 50

## 2012-04-04 MED ORDER — HEPARIN (PORCINE) IN NACL 2-0.9 UNIT/ML-% IJ SOLN
INTRAMUSCULAR | Status: AC
  Filled 2012-04-04: qty 1000

## 2012-04-04 MED ORDER — AMLODIPINE-VALSARTAN-HCTZ 5-160-25 MG PO TABS
0.5000 | ORAL_TABLET | ORAL | Status: DC
  Administered 2012-04-04: 0.5 mg via ORAL
  Administered 2012-04-05 – 2012-04-06 (×2): 0.5 via ORAL
  Filled 2012-04-04 (×11): qty 0.5

## 2012-04-04 MED ORDER — OXYCODONE-ACETAMINOPHEN 5-325 MG PO TABS: 1.0000 | ORAL_TABLET | Freq: Four times a day (QID) | ORAL | Status: DC | PRN

## 2012-04-04 MED ORDER — HYDROCHLOROTHIAZIDE 12.5 MG PO CAPS: 12.5000 mg | ORAL_CAPSULE | Freq: Every day | ORAL | Status: DC

## 2012-04-04 MED ORDER — HYDROCHLOROTHIAZIDE 10 MG/ML ORAL SUSPENSION
6.2500 mg | Freq: Two times a day (BID) | ORAL | Status: DC
  Filled 2012-04-04: qty 1.25

## 2012-04-04 MED ORDER — GABAPENTIN 100 MG PO CAPS
100.0000 mg | ORAL_CAPSULE | Freq: Three times a day (TID) | ORAL | Status: DC
  Administered 2012-04-04 – 2012-04-07 (×8): 100 mg via ORAL
  Filled 2012-04-04 (×10): qty 1

## 2012-04-04 MED ORDER — IRBESARTAN 75 MG PO TABS
75.0000 mg | ORAL_TABLET | Freq: Two times a day (BID) | ORAL | Status: DC
  Filled 2012-04-04: qty 1

## 2012-04-04 MED ORDER — MIDAZOLAM HCL 5 MG/5ML IJ SOLN
INTRAMUSCULAR | Status: AC
  Filled 2012-04-04: qty 5

## 2012-04-04 MED ORDER — AMLODIPINE-VALSARTAN-HCTZ 5-160-12.5 MG PO TABS: 100.0000 mg | ORAL_TABLET | Freq: Once | ORAL | Status: DC

## 2012-04-04 MED ORDER — ONDANSETRON HCL 4 MG/2ML IJ SOLN: 4.0000 mg | Freq: Four times a day (QID) | INTRAMUSCULAR | Status: DC | PRN

## 2012-04-04 NOTE — H&P (View-Only) (Signed)
History and Physical  Patient ID: Randy [name] MRN: [id number], SOB: October 11, 1934 78 y.o. Date of Encounter: 04/03/2012, 5:09 PM  Primary Physician: [name], MD, MD Primary Cardiologist: [facility] Primary Electrophysiologist:  GT/SK  Chief Complaint: fatigue  History of Present Illness: Randy [name] is a 78 y.o. male seen as an urgent request at the request of Dr. Jerrye Bushy because of complete heart block.  He has noted increasing exercise intolerance over the last couple of weeks. He had an episode of this a couple of weeks before. With this he has noted that his heart rate rather than be in the 80s has been in the 30s and 40s. There has been no lightheadedness and no syncope. He denies chest pain.  He has no prior cardiac history. He denies MI shortness of breath; he does have hypertension and some peripheral edema.  Exercise tolerance is also been limited by pain in his hips. He has a history of knee replacement and hip are placed at surgery.     Past Medical History  Diagnosis Date  . Hypertension   . Depression   . Anxiety   . Tremor   . Memory deficit 03/24/2011     Past Surgical History  Procedure Date  . Knee surgery [phone]  . Replacement total knee bilateral 1997  . Umbilical hernia repair 2001  . Cataract extraction 06/2005  . Rotator cuff repair 06/2008  . Total hip arthroplasty 2009    right      Current Outpatient Prescriptions  Medication Sig Dispense Refill  . Amlodipine-Valsartan-HCTZ (EXFORGE HCT) 5-160-12.5 MG TABS 100 mg. Taking 1/2 in the morning and 1/2 at night.      Marland Kitchen aspirin 81 MG EC tablet Take 81 mg by mouth daily.        Marland Kitchen desvenlafaxine (PRISTIQ) 50 MG 24 hr tablet Take 50 mg by mouth daily.        Marland Kitchen gabapentin (NEURONTIN) 100 MG capsule Take 100 mg by mouth 3 (three) times daily.         Allergies: Allergies  Allergen Reactions  . Codeine   . Lodine (Etodolac)   . Percocet (Oxycodone-Acetaminophen)       History  Substance Use Topics  . Smoking status: Former Smoker -- 1.0 packs/day for 18 years    Types: Cigarettes    Quit date: 03/20/1981  . Smokeless tobacco: Never Used  . Alcohol Use: Yes     occasional      Family History  Problem Relation Age of Onset  . Hypertension Mother   . Hypertension Father   . Seizures Father     epilepsy  . Prostate cancer Other   . Cancer Other   . Hypertension Other   . Asthma Other       ROS:  Please see the history of present illness.     All other systems reviewed and negative.   Vital Signs: Blood pressure 130/60, pulse 39, height 5\' 6"  (1.676 m), weight 208 lb (94.348 kg).  PHYSICAL EXAM: General:  Well nourished, well developed male in no acute distress HEENT: normal Lymph: no adenopathy Neck: no JVD Endocrine:  No thryomegaly Vascular: No carotid bruits; FA pulses 2+ bilaterally without bruits Cardiac:  Slow but quiet S1, S2; RRR; no murmur Back: without kyphosis/scoliosis, no CVA tenderness Lungs:  clear to auscultation bilaterally, no wheezing, rhonchi or rales Abd: soft, nontender, no hepatomegaly Ext: no edema Musculoskeletal:  No deformities, BUE and BLE strength normal and equal Skin: warm  and dry Neuro:  CNs 2-12 intact, no focal abnormalities noted Psych:  Normal affect   EKG:  Complete heart block with right bundle branch block escape  Labs:    ASSESSMENT AND PLAN:

## 2012-04-04 NOTE — Interval H&P Note (Signed)
History and Physical Interval Note:  04/04/2012 1:02 PM  Randy [name]  has presented today for surgery, with the diagnosis of Complete Heart Block  The various methods of treatment have been discussed with the patient and family. After consideration of risks, benefits and other options for treatment, the patient has consented to  Procedure(s) (LRB): PERMANENT PACEMAKER INSERTION (N/A) as a surgical intervention .  The patients' history has been reviewed, patient examined, no change in status, stable for surgery.  I have reviewed the patients' chart and labs.  Questions were answered to the patient's satisfaction.     [name]

## 2012-04-04 NOTE — Op Note (Signed)
DDD PPM inserted via the left subclavian vein without immediate complication. N#[id number].

## 2012-04-04 NOTE — Progress Notes (Signed)
  Echocardiogram 2D Echocardiogram has been performed.  Dorena Cookey 04/04/2012, 10:48 AM

## 2012-04-04 NOTE — Op Note (Signed)
NAME:  Randy [name], Randy [name] NO.:  [id number]  MEDICAL RECORD NO.:  [id number]  LOCATION:  [facility]                         FACILITY:  [facility]  PHYSICIAN:  Doylene Canning. Ladona Ridgel, MD    DATE OF BIRTH:  Jul 14, 1934  DATE OF PROCEDURE:  04/04/2012 DATE OF DISCHARGE:                              OPERATIVE REPORT   PROCEDURE PERFORMED:  Insertion of a dual-chamber pacemaker.  INDICATION:  Symptomatic complete heart block.  INTRODUCTION:  The patient is a very pleasant 78 year old man who was referred by Dr. Graciela Husbands after he was found to have a complete heart block. He is on no AV nodal blocking drugs.  PROCEDURE:  After informed consent was obtained, the patient was taken to the diagnostic EP lab in a fasting state.  After usual preparation and draping, intravenous fentanyl and midazolam was given for sedation. A 30 mL of lidocaine was infiltrated into the left infraclavicular region.  A 5-cm incision was carried out over this region. Electrocautery was utilized to dissect down to the fascial plane.  The left subclavian vein was then punctured x2 and the Medtronic model 5076, 58 cm active fixation pacing lead, serial number [id number] was advanced into the right ventricle.  The Medtronic model 5076, 52 cm active fixation pacing lead, serial number [id number] was advanced to the right atrium.  Mapping was first carried out in the right ventricle.  At the final site, the R-waves were 9 mV, the pacing impedance was 800 ohms, and the threshold was less than 1 V at 0.5 msec.  Prominent injury current was present with active fixation of the lead.  With these satisfactory parameters, the attention then turned to placement of the atrial lead, which was placed in anterolateral portion of the right atrium where P-waves measured 2 mV.  The pacing impedance with lead actively fix was 800 ohms and threshold was initially 1.3 V at 0.5 msec. Again, 10 V pacing not stimulate diaphragm and again there  was a prominent injury current.  With both the atrial and the ventricular leads in satisfactory position, they were secured to the subpectoral fascia with a figure-of-eight silk suture.  Sewing sleeve was secured with silk suture.  Electrocautery was utilized to make subcutaneous pocket.  Antibiotic irrigation was utilized to irrigate the pocket. Electrocautery was utilized to assure hemostasis.  The Medtronic Sensia dual-chamber pacemaker serial number [id number] was connected to the atrial and ventricular leads and placed back in the subcutaneous pocket. The pocket was irrigated with antibiotic irrigation, and the incision was closed with 2-0 and 3-0 Vicryl.  Benzoin and Steri-Strips painted on the skin, a pressure dressing was applied, and the patient was returned to his room in satisfactory condition.  COMPLICATIONS:  There were no immediate procedure complications.  RESULTS:  This demonstrates successful implantation of a Medtronic dual- chamber pacemaker in a patient with symptomatic complete heart block.     Doylene Canning. Ladona Ridgel, MD     GWT/MEDQ  D:  04/04/2012  T:  04/04/2012  Job:  [id number]  cc:   Duke Salvia, MD, [facility] Antonieta Iba, MD

## 2012-04-05 ENCOUNTER — Encounter (HOSPITAL_COMMUNITY)
Admission: RE | Disposition: A | Discharge status: Home / Self Care | Payer: Self-pay | Source: Ambulatory Visit | Attending: Internal Medicine

## 2012-04-05 ENCOUNTER — Other Ambulatory Visit: Payer: Self-pay

## 2012-04-05 ENCOUNTER — Ambulatory Visit (HOSPITAL_COMMUNITY): Payer: Medicare Other

## 2012-04-05 DIAGNOSIS — I442 Atrioventricular block, complete: Principal | ICD-10-CM

## 2012-04-05 DIAGNOSIS — R079 Chest pain, unspecified: Secondary | ICD-10-CM

## 2012-04-05 DIAGNOSIS — T82190A Other mechanical complication of cardiac electrode, initial encounter: Secondary | ICD-10-CM

## 2012-04-05 HISTORY — PX: LEAD REVISION: SHX5945

## 2012-04-05 SURGERY — LEAD REVISION
Anesthesia: LOCAL

## 2012-04-05 MED ORDER — CHLORHEXIDINE GLUCONATE 4 % EX LIQD: 60.0000 mL | Freq: Once | CUTANEOUS | Status: DC

## 2012-04-05 MED ORDER — FENTANYL CITRATE 0.05 MG/ML IJ SOLN
INTRAMUSCULAR | Status: AC
  Administered 2012-04-05: 50 ug via INTRAVENOUS
  Filled 2012-04-05: qty 2

## 2012-04-05 MED ORDER — SODIUM CHLORIDE 0.45 % IV SOLN
INTRAVENOUS | Status: DC
  Administered 2012-04-05: 13:00:00 via INTRAVENOUS

## 2012-04-05 MED ORDER — HYDROCODONE-ACETAMINOPHEN 5-325 MG PO TABS
1.0000 | ORAL_TABLET | ORAL | Status: DC | PRN
  Administered 2012-04-05: 2 via ORAL
  Administered 2012-04-05: 1 via ORAL
  Administered 2012-04-06: 2 via ORAL
  Administered 2012-04-06: 1 via ORAL
  Administered 2012-04-06: 2 via ORAL
  Administered 2012-04-07: 1 via ORAL
  Filled 2012-04-05 (×2): qty 2
  Filled 2012-04-05: qty 1
  Filled 2012-04-05 (×2): qty 2
  Filled 2012-04-05: qty 1

## 2012-04-05 MED ORDER — SODIUM CHLORIDE 0.9 % IJ SOLN: 3.0000 mL | INTRAMUSCULAR | Status: DC | PRN

## 2012-04-05 MED ORDER — SODIUM CHLORIDE 0.9 % IV SOLN: INTRAVENOUS | Status: DC

## 2012-04-05 MED ORDER — FENTANYL CITRATE 0.05 MG/ML IJ SOLN
50.0000 ug | INTRAMUSCULAR | Status: DC | PRN
  Administered 2012-04-05: 50 ug via INTRAVENOUS

## 2012-04-05 MED ORDER — SODIUM CHLORIDE 0.9 % IJ SOLN: 3.0000 mL | Freq: Two times a day (BID) | INTRAMUSCULAR | Status: DC

## 2012-04-05 MED ORDER — ONDANSETRON HCL 4 MG/2ML IJ SOLN: 4.0000 mg | Freq: Four times a day (QID) | INTRAMUSCULAR | Status: DC | PRN

## 2012-04-05 MED ORDER — SODIUM CHLORIDE 0.9 % IV SOLN: 250.0000 mL | INTRAVENOUS | Status: DC

## 2012-04-05 MED ORDER — CEFAZOLIN SODIUM-DEXTROSE 2-3 GM-% IV SOLR
2.0000 g | INTRAVENOUS | Status: DC
  Filled 2012-04-05 (×3): qty 50

## 2012-04-05 MED ORDER — LIDOCAINE HCL (PF) 1 % IJ SOLN
INTRAMUSCULAR | Status: AC
  Filled 2012-04-05: qty 60

## 2012-04-05 MED ORDER — HEPARIN (PORCINE) IN NACL 2-0.9 UNIT/ML-% IJ SOLN
INTRAMUSCULAR | Status: AC
  Filled 2012-04-05: qty 1000

## 2012-04-05 MED ORDER — MIDAZOLAM HCL 5 MG/5ML IJ SOLN
INTRAMUSCULAR | Status: AC
  Filled 2012-04-05: qty 5

## 2012-04-05 MED ORDER — ACETAMINOPHEN 325 MG PO TABS: 325.0000 mg | ORAL_TABLET | ORAL | Status: DC | PRN

## 2012-04-05 MED ORDER — GENTAMICIN SULFATE 40 MG/ML IJ SOLN
80.0000 mg | INTRAMUSCULAR | Status: DC
  Filled 2012-04-05 (×3): qty 2

## 2012-04-05 MED ORDER — FENTANYL CITRATE 0.05 MG/ML IJ SOLN
INTRAMUSCULAR | Status: AC
  Filled 2012-04-05: qty 2

## 2012-04-05 MED ORDER — CEFAZOLIN SODIUM 1-5 GM-% IV SOLN
1.0000 g | Freq: Four times a day (QID) | INTRAVENOUS | Status: AC
  Administered 2012-04-05 – 2012-04-06 (×3): 1 g via INTRAVENOUS
  Filled 2012-04-05 (×3): qty 50

## 2012-04-05 MED ORDER — IBUPROFEN 600 MG PO TABS
600.0000 mg | ORAL_TABLET | Freq: Three times a day (TID) | ORAL | Status: DC
  Administered 2012-04-05 – 2012-04-07 (×6): 600 mg via ORAL
  Filled 2012-04-05 (×10): qty 1

## 2012-04-05 NOTE — Progress Notes (Signed)
Patient ID: Randy [name], male   DOB: 07/17/1934, 78 y.o.   MRN: [id number] Subjective:  C/o mild chest and back pain after PPM, now resolved.  Objective:  Vital Signs in the last 24 hours: Temp:  [97.2 F (36.2 C)-98.9 F (37.2 C)] 98.9 F (37.2 C) (04/25 0800) Pulse Rate:  [60-65] 65  (04/25 0800) Resp:  [16-18] 18  (04/25 0800) BP: (126-145)/(60-81) 145/81 mmHg (04/25 0800) SpO2:  [94 %-97 %] 94 % (04/25 0800)  Intake/Output from previous day: 04/24 0701 - 04/25 0700 In: 240 [P.O.:240] Out: 200 [Urine:200] Intake/Output from this shift:    Physical Exam: Well appearing elderly man,  NAD HEENT: Unremarkable Neck:  No JVD, no thyromegally Lungs:  Clear with no wheezes. HEART:  Regular rate rhythm, no murmurs, no rubs, no clicks Abd:  Flat, positive bowel sounds, no organomegally, no rebound, no guarding Ext:  2 plus pulses, no edema, no cyanosis, no clubbing Skin:  No rashes no nodules Neuro:  CN II through XII intact, motor grossly intact  Lab Results:  Basename 04/04/12 0950  WBC 7.2  HGB 14.2  PLT 150    Basename 04/04/12 0950  NA 143  K 4.2  CL 105  CO2 28  GLUCOSE 70  BUN 24*  CREATININE 0.95   No results found for this basename: TROPONINI:2,CK,MB:2 in the last 72 hours Hepatic Function Panel No results found for this basename: PROT,ALBUMIN,AST,ALT,ALKPHOS,BILITOT,BILIDIR,IBILI in the last 72 hours No results found for this basename: CHOL in the last 72 hours No results found for this basename: PROTIME in the last 72 hours  Imaging: Dg Chest 2 View  04/05/2012  *RADIOLOGY REPORT*  Clinical Data: Status post pacemaker placement.  CHEST - 2 VIEW  Comparison: PA and lateral chest 04/04/2012.  Findings: New dual lead pacing device is in place.  Generator is positioned in the left upper chest.  Tip of the proximal lead is in the right atrium with distal lead in the apex of the right ventricle. There is no pneumothorax. Lungs are clear.  Heart size  is normal.  IMPRESSION: Pacemaking device in place without evidence of complication.  No acute finding.  Original Report Authenticated By: Bernadene Bell. Maricela Curet, M.D.   Dg Chest 2 View  04/04/2012  *RADIOLOGY REPORT*  Clinical Data: 78 year old male with planned pacemaker insertion. Hypertension.  CHEST - 2 VIEW  Comparison: None.  Findings: There is cardiomegaly.  Other mediastinal contours are within normal limits.  Mild elevation of the right hemidiaphragm. No pneumothorax, pulmonary edema, pleural effusion or confluent pulmonary opacity.  Bulky flowing osteophytes in the spine. Visualized tracheal air column is within normal limits.  Serve pass bones  IMPRESSION: Cardiomegaly.  No acute cardiopulmonary abnormality.  Original Report Authenticated By: Harley Hallmark, M.D.    Cardiac Studies: Tele - NSR with AV pacing Assessment/Plan:  1. CHB - s/p PPM 2. PPM - working normally. CXR pending 3. Back and chest pain - concerned that this might represent microperforation. I will review cxr and ask patient to remain in hospital for 24 hours. If pain resolves, then discharge. If pain persists then would  Consider lead revision.  LOS: 1 day    [name] 04/05/2012, 9:21 AM

## 2012-04-05 NOTE — Progress Notes (Signed)
Patient complaining of sharp chest and back pain when lying flat on his back, and when taking in a deep breath. EKG showed NSR, Left axis deviation, Nonspecific intraventricular block. P 60 R 20 BP 157/76. Dr. Charm Barges responded to page, and ordered a one time dose of 5-325 mg Norco PO. Administered, will continue to monitor patient.

## 2012-04-05 NOTE — H&P (View-Only) (Signed)
Pt continues to have pleuritic chest pain; cxr neg. VS stable   No rub, but early murmur   Discomfort is midsternal, no intrinsic rhythm this am on evaluation  Needs lead repositioning.  Have discussed situation with family incl risk and benefits  They understand and are willling to proceed 

## 2012-04-05 NOTE — Progress Notes (Signed)
Called by RN because pt having chest pain. Contacted GT, will use Motrin scheduled for pain control and pt will have PRN narcotics as well. Possible lead revision in am. Cont to follow symptoms.

## 2012-04-05 NOTE — Progress Notes (Signed)
Pt continues to have pleuritic chest pain; cxr neg. VS stable   No rub, but early murmur   Discomfort is midsternal, no intrinsic rhythm this am on evaluation  Needs lead repositioning.  Have discussed situation with family incl risk and benefits  They understand and are willling to proceed

## 2012-04-05 NOTE — Interval H&P Note (Signed)
History and Physical Interval Note:  04/05/2012 1:30 PM  Randy [name]  has presented today for surgery, with the diagnosis of bad lead  The various methods of treatment have been discussed with the patient and family. After consideration of risks, benefits and other options for treatment, the patient has consented to  Procedure(s) (LRB): LEAD REVISION (N/A) as a surgical intervention .  The patients' history has been reviewed, patient examined, no change in status, stable for surgery.  I have reviewed the patients' chart and labs.  Questions were answered to the patient's satisfaction.     Sherryl Manges

## 2012-04-05 NOTE — CV Procedure (Signed)
preop diagnosis  Microperforation following DDD implant for CHB Post op Dx same     Procedure: Lead presoitioning X2 Following informed consent the patient was brought to the electrophysiology laboratory in place of the fluoroscopic table in the supine position after routine prep and drape lidocaine was infiltrated in the region of the previous incision and carried down to later the device pocket using sharp dissection and electrocautery. The pocket was opened the device was freed up and was explanted.\   The pt had an intrinsic rhythm   The previously implanted atrial lead  Was repositioned to the atrial appendage    P-wave amplitude of 4.7 illlivolts  and impedance of  547 ohms, and a pacing threshold of 1.3volts at o.59milliseconds. Following repositioning of  previously implanted ventricular lead  To the RV apex  demonstrated an R wave of 13.9 millivolts., and impedance of 1062ohms, and a pacing threshold of 0.9 volts at 0.5 msec.   In both lead current of injury was brisk and no diaghrahm pacing was noted at 10V  The leads were reattached and V pacing followed by P-synchronous/ AV  pacing    The pocket was irrigated with antibiotic containing saline solution hemostasis was assured and the leads and the device were placed in the pocket. The wound is then closed in 3 layers in normal fashion.  The patient tolerated the procedure without apparent complication.  Complaining of no further pain at this junctuire  Sherryl Manges

## 2012-04-06 ENCOUNTER — Observation Stay (HOSPITAL_COMMUNITY): Payer: Medicare Other

## 2012-04-06 NOTE — Progress Notes (Signed)
   CARE MANAGEMENT NOTE 04/06/2012  Patient:  Randy [name], Randy [name]   Account Number:  [id number]  Date Initiated:  04/06/2012  Documentation initiated by:  GRAVES-BIGELOW,Amador Braddy  Subjective/Objective Assessment:   Pt admitted with fatigue. EKG:  Complete heart block with right bundle branch block escape.  S/p PPM 04-04-12 had pleuritic  cp and s/p lead revision.     Action/Plan:   Pt is from home with wife. Plan for home in am.   Anticipated DC Date:  04/07/2012   Anticipated DC Plan:  HOME/SELF CARE      DC Planning Services  CM consult      Choice offered to / List presented to:             Status of service:  Completed, signed off Medicare Important Message given?   (If response is "NO", the following Medicare IM given date fields will be blank) Date Medicare IM given:   Date Additional Medicare IM given:    Discharge Disposition:  HOME/SELF CARE  Per UR Regulation:    If discussed at Long Length of Stay Meetings, dates discussed:    Comments:  04-06-12 1231 Tomi Bamberger, RN,BSN [phone] No needs from CM at this time.

## 2012-04-06 NOTE — Progress Notes (Signed)
   ELECTROPHYSIOLOGY ROUNDING NOTE    Patient Name: Randy [name] Date of Encounter: 04-06-2012    SUBJECTIVE:Feels much better today.  Some back spasms last night, distinctly different from chest pain prior to lead revision.  No chest pain or shortness of breath.  Minimal incisional soreness.  TELEMETRY: Reviewed telemetry pt in sinus rhythm with ventricular pacing Filed Vitals:   04/05/12 1400 04/05/12 1657 04/05/12 2100 04/06/12 0500  BP: 139/74 129/72 143/68 116/70  Pulse: 71 60 69 78  Temp: 97.7 F (36.5 C) 97.2 F (36.2 C) 98 F (36.7 C) 97.6 F (36.4 C)  TempSrc: Oral Oral Oral Oral  Resp: 20 20 18 18   Height:      Weight:      SpO2: 95% 100% 95% 96%    Intake/Output Summary (Last 24 hours) at 04/06/12 0747 Last data filed at 04/06/12 0500  Gross per 24 hour  Intake  84.17 ml  Output    100 ml  Net -15.83 ml   Well developed and nourished in no acute distress HENT normal Neck supple with JVP-flat Clear Pocekt without hematoma Regular rate and rhythm, no rub Abd-soft with active BS No Clubbing cyanosis edema Skin-warm and dry A & Oriented  Grossly normal sensory and motor function   LABS: Basic Metabolic Panel:  Basename 04/04/12 0950  NA 143  K 4.2  CL 105  CO2 28  GLUCOSE 70  BUN 24*  CREATININE 0.95  CALCIUM 9.7  MG --  PHOS --   CBC:  Basename 04/04/12 0950  WBC 7.2  NEUTROABS --  HGB 14.2  HCT 43.1  MCV 98.2  PLT 150    Radiology/Studies:  Chest x-ray not yet done this morning, portable chest x-ray ordered.    DEVICE INTERROGATION: Device interrogated by industry.  Lead values including impedence, sensing, threshold within normal values.    Wound care, arm mobility, restrictions reviewed with patient.    He would like to be followed in [place], will arrange for wound check and device follow up there.  DEvice dependent per industry so will discharge in am  Blood pressure elevated previously but better

## 2012-04-06 NOTE — Progress Notes (Signed)
UR Completed. Simmons, Shunna Mikaelian F [phone]  

## 2012-04-07 MED ORDER — AMLODIPINE BESYLATE 2.5 MG PO TABS
2.5000 mg | ORAL_TABLET | Freq: Two times a day (BID) | ORAL | Status: DC
  Filled 2012-04-07 (×2): qty 1

## 2012-04-07 MED ORDER — IRBESARTAN 75 MG PO TABS
75.0000 mg | ORAL_TABLET | Freq: Two times a day (BID) | ORAL | Status: DC
  Filled 2012-04-07 (×2): qty 1

## 2012-04-07 MED ORDER — HYDROCHLOROTHIAZIDE 12.5 MG PO CAPS
12.5000 mg | ORAL_CAPSULE | Freq: Two times a day (BID) | ORAL | Status: DC
  Filled 2012-04-07 (×3): qty 1

## 2012-04-07 NOTE — Discharge Summary (Signed)
Physician Discharge Summary  Patient ID: Randy [name] MRN: [id number] DOB/AGE: November 26, 1934 78 y.o.  Admit date: 04/04/2012 Discharge date: 04/07/2012  Primary Cardiologist: Delane Ginger, MD Primary Electrophysiologist: GT/SK  Primary Discharge Diagnosis: 1 Symptomatic complete heart block  - Status post successful implantation of a Medtronic dual-chamber pacemaker.  Secondary Discharge Diagnoses: Past Medical History  Diagnosis Date  . Hypertension   . Depression   . Anxiety   . Tremor   . Memory deficit 03/24/2011  . Pacemaker   . Shortness of breath   . Complete heart block   . Blood transfusion     hx of autologus transfusion  . Neuromuscular disorder     periferal neuropathy  . Arthritis     Reason for Admission: 78 y.o. Male, with no prior cardiac history, seen at the urgent request of Dr. Delane Ginger,  for evalutation of complete heart block.    Procedures:   1  Dual-chamber pacemaker implantation (Medtronic model 5076, 52 cm active   fixation pacing lead, serial number [id number]) 2  2D Echocardiogram: LV EF: 55% - 60%   Hospital Course: Patient underwent permanent pacemaker implantation, by Dr. Sharrell Ku, with no initial complications.  On postop day #1, however, the patient complained of back/chest pain, and there was initial concern that this could represent microperforation. Patient was evaluated by Dr. Sherryl Manges, who did find evidence of microperforation, requiring taking the patient back to the EP lab for lead repositioning. There were no initial apparent complications with this second intervention. Device was was interrogated by industry rep, with all parameters within normal limits.  The patient was was cleared for discharge home the following morning, in hemodynamically stable condition. Arrangements will be made for him to follow in our [place] office, with Dr. Sherryl Manges, for followup pacemaker wound check.   Labs: Lab Results    Component Value Date   WBC 7.2 04/04/2012   HGB 14.2 04/04/2012   HCT 43.1 04/04/2012   MCV 98.2 04/04/2012   PLT 150 04/04/2012      Lab 04/04/12 0950  NA 143  K 4.2  CL 105  CO2 28  BUN 24*  CREATININE 0.95  CALCIUM 9.7  ALBUMIN --  PROT --  BILITOT --  ALKPHOS --  ALT --  AST --  GLUCOSE 70    No results found for this basename: CHOL, HDL, LDLCALC, TRIG    No results found for this basename: DDIMER    No results found for this basename: TSH    No results found for this basename: CKTOTAL:3,CKMB:3,TROPONINI:3 in the last 72 hours  Diagnostic Studies: Dg Chest 2 View  04/05/2012  *RADIOLOGY REPORT*  Clinical Data: Status post pacemaker placement.  CHEST - 2 VIEW  Comparison: PA and lateral chest 04/04/2012.  Findings: New dual lead pacing device is in place.  Generator is positioned in the left upper chest.  Tip of the proximal lead is in the right atrium with distal lead in the apex of the right ventricle. There is no pneumothorax. Lungs are clear.  Heart size is normal.  IMPRESSION: Pacemaking device in place without evidence of complication.  No acute finding.  Original Report Authenticated By: Bernadene Bell. Maricela Curet, M.D.   Dg Chest 2 View  04/04/2012  *RADIOLOGY REPORT*  Clinical Data: 78 year old male with planned pacemaker insertion. Hypertension.  CHEST - 2 VIEW  Comparison: None.  Findings: There is cardiomegaly.  Other mediastinal contours are within normal limits.  Mild elevation of the right  hemidiaphragm. No pneumothorax, pulmonary edema, pleural effusion or confluent pulmonary opacity.  Bulky flowing osteophytes in the spine. Visualized tracheal air column is within normal limits.  Serve pass bones  IMPRESSION: Cardiomegaly.  No acute cardiopulmonary abnormality.  Original Report Authenticated By: Harley Hallmark, M.D.   Dg Chest Port 1 View  04/06/2012  *RADIOLOGY REPORT*  Clinical Data: 78 year old male status post pacemaker placement.  PORTABLE CHEST - 1 VIEW   Comparison: 04/05/2012 and earlier.  Findings: Semi upright AP portable view 0820 hours.  Lower lung volumes.  Stable left chest dual lead cardiac pacemaker.  No pneumothorax or pulmonary edema.  Mildly increased retrocardiac opacity.  Stable cardiac size and mediastinal contours.  No definite effusion  IMPRESSION: Stable left chest cardiac pacemaker with no adverse features. Lower lung volumes with basilar atelectasis.  Original Report Authenticated By: Harley Hallmark, M.D.     DISPOSITION: Stable condition  FOLLOW UP PLANS AND APPOINTMENTS: Discharge Orders    Future Appointments: Provider: Department: Dept Phone: Center:   04/16/2012 2:00 PM [facility] [phone] [facility]   07/26/2012 1:45 PM Duke Salvia, MD [facility] [phone] [facility]       DISCHARGE MEDICATIONS: Medication List  As of 04/07/2012  8:41 AM   ASK your doctor about these medications         aspirin 81 MG EC tablet   Take 81 mg by mouth daily.      desvenlafaxine 50 MG 24 hr tablet   Commonly known as: PRISTIQ   Take 50 mg by mouth daily.      EXFORGE HCT 10-160-25 MG Tabs   Generic drug: Amlodipine-Valsartan-HCTZ   Take 0.5 tablets by mouth 2 (two) times daily. Takes at 8 AM and 9 PM      gabapentin 100 MG capsule   Commonly known as: NEURONTIN   Take 100 mg by mouth 3 (three) times daily.            BRING ALL MEDICATIONS WITH YOU TO FOLLOW UP APPOINTMENTS  Time spent with patient to include physician time: Greater than 30 minutes, including physician time.  Signed: Gene Analiyah Lechuga 04/07/2012, 8:41 AM Co-Sign MD

## 2012-04-07 NOTE — Progress Notes (Signed)
Patient ID: Randy [name], male   DOB: 03-Sep-1934, 78 y.o.   MRN: [id number] Subjective:  No chest or back pain. Feels well.  Objective:  Vital Signs in the last 24 hours: Temp:  [97.6 F (36.4 C)-98.3 F (36.8 C)] 98.3 F (36.8 C) (04/27 0616) Pulse Rate:  [71-78] 71  (04/27 0616) Resp:  [18-20] 20  (04/27 0616) BP: (94-108)/(48-62) 107/53 mmHg (04/27 0616) SpO2:  [95 %-97 %] 97 % (04/27 0616)  Intake/Output from previous day: 04/26 0701 - 04/27 0700 In: 720 [P.O.:720] Out: 375 [Urine:375] Intake/Output from this shift:    Physical Exam: Well appearing elderly man, NAD HEENT: Unremarkable Neck:  No JVD, no thyromegally Lymphatics:  No adenopathy Back:  No CVA tenderness Lungs:  Clear with no wheezes. No hematoma at PPM insertion site. HEART:  Regular rate rhythm, no murmurs, no rubs, no clicks Abd:  Flat, positive bowel sounds, no organomegally, no rebound, no guarding Ext:  2 plus pulses, no edema, no cyanosis, no clubbing Skin:  No rashes no nodules Neuro:  CN II through XII intact, motor grossly intact  Lab Results:  Basename 04/04/12 0950  WBC 7.2  HGB 14.2  PLT 150    Basename 04/04/12 0950  NA 143  K 4.2  CL 105  CO2 28  GLUCOSE 70  BUN 24*  CREATININE 0.95   No results found for this basename: TROPONINI:2,CK,MB:2 in the last 72 hours Hepatic Function Panel No results found for this basename: PROT,ALBUMIN,AST,ALT,ALKPHOS,BILITOT,BILIDIR,IBILI in the last 72 hours No results found for this basename: CHOL in the last 72 hours No results found for this basename: PROTIME in the last 72 hours  Imaging: Dg Chest 2 View  04/05/2012  *RADIOLOGY REPORT*  Clinical Data: Status post pacemaker placement.  CHEST - 2 VIEW  Comparison: PA and lateral chest 04/04/2012.  Findings: New dual lead pacing device is in place.  Generator is positioned in the left upper chest.  Tip of the proximal lead is in the right atrium with distal lead in the apex of the right  ventricle. There is no pneumothorax. Lungs are clear.  Heart size is normal.  IMPRESSION: Pacemaking device in place without evidence of complication.  No acute finding.  Original Report Authenticated By: Bernadene Bell. Maricela Curet, M.D.   Dg Chest Port 1 View  04/06/2012  *RADIOLOGY REPORT*  Clinical Data: 78 year old male status post pacemaker placement.  PORTABLE CHEST - 1 VIEW  Comparison: 04/05/2012 and earlier.  Findings: Semi upright AP portable view 0820 hours.  Lower lung volumes.  Stable left chest dual lead cardiac pacemaker.  No pneumothorax or pulmonary edema.  Mildly increased retrocardiac opacity.  Stable cardiac size and mediastinal contours.  No definite effusion  IMPRESSION: Stable left chest cardiac pacemaker with no adverse features. Lower lung volumes with basilar atelectasis.  Original Report Authenticated By: Harley Hallmark, M.D.    Cardiac Studies: Tele - AV sequential pacing. Assessment/Plan:  1. CHB - s/p PPM 2. Chest pain after PPM consistent with microperforation - s/p revision, doing well 3. Back pain - resolved. Rec: - discharge home with usual followup. He will see Dr. Crissie Sickles back in our [place] office. Continue current meds.  LOS: 3 days    [name] 04/07/2012, 8:53 AM

## 2012-04-07 NOTE — Discharge Instructions (Signed)
Pacemaker Implantation A pacemaker (pacer) is a small device that acts as a backup or takes over for the natural pacemaker of the heart. The heart has its own electrical system to regulate the beating of the heart muscles. The heart pumps best when it beats in a regular, coordinated rhythm.  A pacer consists of a small device (generator) which produces electrical signals that tell your heart to beat. The generator contains a lithium battery and a tiny computer. Wires (leads) connect the generator to the heart. The pacer is placed under the skin through a small cut. It senses every heartbeat and only fires when the heart rate falls outside certain levels. When the pacer triggers a heart beat, it is called "capture." PROBLEMS THAT MAY BE HELPED BY A PACEMAKER:  Your heart rate is sometimes too slow or irregular.   Fainting, dizziness, weakness or confusion as a result of low blood flow.   Shortness of breath.   Chest pain or angina if the heart needs more blood and oxygen.   Disturbed sleep as a result of abnormal heart rhythm.   Palpitations or the feeling that the heart is beating too fast, too hard or in an irregular way.   Weak heart muscle pumping ability.  PROCEDURE   The pacer may be placed under the skin near the collarbone, while you are under sedation. An abdominal wall location may be another option.   The leads are inserted into a vein that lies just under the collarbone, then guided into place under x-ray. The tips of the wires touch the inside of the heart. The near end of the pacer wires are connected to the generator under the skin.   For individuals with thinner chest walls, it may be possible to feel the device under the skin, and a slight bump may be seen.  HOME CARE INSTRUCTIONS   Keep the incision dry for a week after the procedure.   For about 8 weeks, avoid sudden or jerky movements that pull your arm away from your body. This could change the position of the leads.     Take medicine exactly as directed.   Learn how to check your pulse. Follow directions about when to call or be concerned.   Be physically active every day. Ask your caregiver how and when to increase activity.   Household appliances do not interfere with pacemakers.   Travel by car, train or airplane should not be a problem. Carry a pacemaker ID card in case the device sets off a metal detector.  PACEMAKER CARE:  Avoid putting pressure over the area where the pacer was put in.   Digital cell phones should be kept 12 inches away from the pacemaker. Hold them at the ear on the side opposite of the pacer.   Never leave a cell phone in a pocket over the pacemaker.   Avoid strong electro-magnetic fields. You will not be able to have an MRI scan because of the strong magnets.   Pacer batteries last about 5 years and give off warning signals when they are running low on power. Pacers may be checked every 3 months. This allows plenty of time to change the generator when it is running low on power.   Changing the battery means removing the old generator through the same cut and plugging the existing wires into the new generator.   An EKG or heart monitor is used to see if your pacer is working properly. Sometimes signals may be sent   over a land line phone to your clinic.   Tell emergency responders that you have a pacemaker.  SEEK MEDICAL CARE IF:  You begin to gain weight and your feet and ankles swell.   You have dizzy spells or feel weak.   Your pulse rate drops below the limit or is too fast.  SEEK IMMEDIATE MEDICAL CARE IF:  You faint or pass out.   You have chest pain or shortness of breath.   You are injured and think your pacemaker may have been damaged.   You are suddenly very tired or have pain in your back.   You are worried that your heart is not beating right or cannot feel your pulse.  Document Released: 11/18/2002 Document Revised: 11/17/2011 Document Reviewed:  01/25/2008 [facility] Patient Information 2012 [place], [place].     Supplemental Discharge Instructions for  Pacemaker/Defibrillator Patients  Activity No heavy lifting or vigorous activity with your left/right arm for 6 to 8 weeks.  Do not raise your left/right arm above your head for one week.  Gradually raise your affected arm as drawn below.                 NO DRIVING for      ; you may begin driving on          . WOUND CARE   Keep the wound area clean and dry.  Do not get this area wet for one week. No showers for one week; you may shower on              .   The tape/steri-strips on your wound will fall off; do not pull them off.  No bandage is needed on the site.  DO  NOT apply any creams, oils, or ointments to the wound area.   If you notice any drainage or discharge from the wound, any swelling or bruising at the site, or you develop a fever > 101? F after you are discharged home, call the office at once.  Special Instructions   You are still able to use cellular telephones; use the ear opposite the side where you have your pacemaker/defibrillator.  Avoid carrying your cellular phone near your device.   When traveling through airports, show security personnel your identification card to avoid being screened in the metal detectors.  Ask the security personnel to use the hand wand.   Avoid arc welding equipment, MRI testing (magnetic resonance imaging), TENS units (transcutaneous nerve stimulators).  Call the office for questions about other devices.   Avoid electrical appliances that are in poor condition or are not properly grounded.   Microwave ovens are safe to be near or to operate.  Additional information for defibrillator patients should your device go off:   If your device goes off ONCE and you feel fine afterward, notify the device clinic nurses.   If your device goes off ONCE and you do not feel well afterward, call 911.   If your device goes off TWICE, call 911.   If  your device goes off THREE times in one day, call 911.  DO NOT DRIVE YOURSELF OR A FAMILY MEMBER WITH A DEFIBRILLATOR TO THE HOSPITAL--CALL 911.

## 2012-04-09 ENCOUNTER — Encounter: Payer: Self-pay | Admitting: Internal Medicine

## 2012-04-09 ENCOUNTER — Ambulatory Visit (HOSPITAL_COMMUNITY): Payer: Medicare Other | Attending: Cardiology

## 2012-04-09 ENCOUNTER — Ambulatory Visit (INDEPENDENT_AMBULATORY_CARE_PROVIDER_SITE_OTHER): Payer: Medicare Other | Admitting: Internal Medicine

## 2012-04-09 ENCOUNTER — Telehealth: Payer: Self-pay | Admitting: Internal Medicine

## 2012-04-09 ENCOUNTER — Other Ambulatory Visit: Payer: Self-pay

## 2012-04-09 ENCOUNTER — Telehealth: Payer: Self-pay | Admitting: *Deleted

## 2012-04-09 VITALS — BP 155/83 | HR 69 | Ht 72.0 in | Wt 208.0 lb

## 2012-04-09 DIAGNOSIS — I509 Heart failure, unspecified: Secondary | ICD-10-CM

## 2012-04-09 DIAGNOSIS — E669 Obesity, unspecified: Secondary | ICD-10-CM | POA: Insufficient documentation

## 2012-04-09 DIAGNOSIS — R05 Cough: Secondary | ICD-10-CM | POA: Insufficient documentation

## 2012-04-09 DIAGNOSIS — Z87891 Personal history of nicotine dependence: Secondary | ICD-10-CM | POA: Insufficient documentation

## 2012-04-09 DIAGNOSIS — R059 Cough, unspecified: Secondary | ICD-10-CM | POA: Insufficient documentation

## 2012-04-09 DIAGNOSIS — R062 Wheezing: Secondary | ICD-10-CM | POA: Insufficient documentation

## 2012-04-09 DIAGNOSIS — R0609 Other forms of dyspnea: Secondary | ICD-10-CM | POA: Insufficient documentation

## 2012-04-09 DIAGNOSIS — I519 Heart disease, unspecified: Secondary | ICD-10-CM | POA: Insufficient documentation

## 2012-04-09 DIAGNOSIS — I442 Atrioventricular block, complete: Secondary | ICD-10-CM | POA: Insufficient documentation

## 2012-04-09 DIAGNOSIS — I10 Essential (primary) hypertension: Secondary | ICD-10-CM | POA: Insufficient documentation

## 2012-04-09 DIAGNOSIS — R0989 Other specified symptoms and signs involving the circulatory and respiratory systems: Secondary | ICD-10-CM | POA: Insufficient documentation

## 2012-04-09 DIAGNOSIS — R0602 Shortness of breath: Secondary | ICD-10-CM | POA: Insufficient documentation

## 2012-04-09 DIAGNOSIS — I517 Cardiomegaly: Secondary | ICD-10-CM | POA: Insufficient documentation

## 2012-04-09 DIAGNOSIS — I359 Nonrheumatic aortic valve disorder, unspecified: Secondary | ICD-10-CM | POA: Insufficient documentation

## 2012-04-09 DIAGNOSIS — I319 Disease of pericardium, unspecified: Secondary | ICD-10-CM | POA: Insufficient documentation

## 2012-04-09 NOTE — Patient Instructions (Signed)
Follow up as scheduled for a wound check on Monday 04/16/12 at 2:00 pm in the [place] office.

## 2012-04-09 NOTE — Telephone Encounter (Signed)
Wife and husband call today b/c of wheezing, coughing, and increased snoring at night since hospital discharge.  Pt was experiencing wheezing at rest before he left the hospital.   He is a mouthbreather at night and has been waking up with a sore throat. No redness or patchiness noted. He is feeling stronger, denies fever, or angina. Dr. Graciela Husbands reviewed recommended ECHO to evaluate ? Fluid, with follow-up appt in the next day or two Dr. Mariah Milling. Echo scheduled for 4pm here today. Wednesday 11am with Dr. Mariah Milling in [place]. Pt & wife are aware of appts & agree with plan Mylo Red RN

## 2012-04-09 NOTE — Telephone Encounter (Signed)
Dr. Graciela Husbands aware pt has ECHO today at 4pm and will see pt afterward. Appt with Dr. Mariah Milling cancelled Wife and pt are aware of changes. Mylo Red RN

## 2012-04-09 NOTE — Progress Notes (Signed)
  HPI  Randy [name] is a 78 y.o. male Seen at the request of his wife because of unusual sounds while he is breathing. There has been some sonorous noising, some "purring" as well as increased somnolence.  He is much less shortness of breath however following pacing  Past Medical History  Diagnosis Date  . Hypertension   . Depression   . Anxiety   . Tremor   . Memory deficit 03/24/2011  . Pacemaker   . Shortness of breath   . Complete heart block   . Blood transfusion     hx of autologus transfusion  . Neuromuscular disorder     peripheral  neuropathy  . Arthritis     Past Surgical History  Procedure Date  . Knee surgery [phone]  . Replacement total knee bilateral 1997  . Umbilical hernia repair 2001  . Cataract extraction 06/2005  . Rotator cuff repair 06/2008  . Total hip arthroplasty 2009    right  . Defibrilator 04/04/2012    Dual Chamber  . Insert / replace / remove pacemaker 04/04/2012    Current Outpatient Prescriptions  Medication Sig Dispense Refill  . Amlodipine-Valsartan-HCTZ (EXFORGE HCT) 10-160-25 MG TABS Take 0.5 tablets by mouth 2 (two) times daily. Takes at 8 AM and 9 PM      . aspirin 81 MG EC tablet Take 81 mg by mouth daily.        Marland Kitchen desvenlafaxine (PRISTIQ) 50 MG 24 hr tablet Take 50 mg by mouth daily.        Marland Kitchen gabapentin (NEURONTIN) 100 MG capsule Take 100 mg by mouth 3 (three) times daily.        Allergies  Allergen Reactions  . Codeine Nausea And Vomiting  . Lodine (Etodolac) Rash  . Percocet (Oxycodone-Acetaminophen) Rash    Tolerates tylenol    Review of Systems negative except from HPI and PMH  Physical Exam BP 155/83  Pulse 69  Ht 6' (1.829 m)  Wt 208 lb (94.348 kg)  BMI 28.21 kg/m2 Well developed and well nourished in no acute distress HENT normal E scleral and icterus clear Neck Supple JVP flat; carotids brisk and full Clear to ausculation Regular rate and rhythm, no murmurs gallops or rub Soft with active  bowel sounds No clubbing cyanosis none Edema Alert and oriented, grossly normal motor and sensory function Skin Warm and Dry   Assessment and  Plan

## 2012-04-09 NOTE — Telephone Encounter (Signed)
Pt wife calling stating that her husband has increased snoring and wheezing since pacemaker put in. Happens more when he is flat in bed subside some when he sits up.

## 2012-04-09 NOTE — Assessment & Plan Note (Signed)
The patient's shortness of breath/sonorous breathing does not seem to be related to volume overload evident on the right side of his body and his echo demonstrates no pericardial effusion. I suspect that this may represent an upper respiratory infection. He'll continue to follow

## 2012-04-11 ENCOUNTER — Ambulatory Visit: Payer: Medicare Other | Admitting: Cardiovascular Disease

## 2012-04-16 ENCOUNTER — Encounter: Payer: Self-pay | Admitting: Internal Medicine

## 2012-04-16 ENCOUNTER — Ambulatory Visit (INDEPENDENT_AMBULATORY_CARE_PROVIDER_SITE_OTHER): Payer: Medicare Other | Admitting: *Deleted

## 2012-04-16 DIAGNOSIS — I442 Atrioventricular block, complete: Secondary | ICD-10-CM

## 2012-04-16 LAB — PACEMAKER DEVICE OBSERVATION
AL THRESHOLD: 0.5 V
ATRIAL PACING PM: 7
RV LEAD IMPEDENCE PM: 738 Ohm
RV LEAD THRESHOLD: 0.5 V
VENTRICULAR PACING PM: 100

## 2012-04-16 NOTE — Progress Notes (Signed)
Wound check-PPM 

## 2012-05-03 NOTE — Telephone Encounter (Signed)
Opened in error

## 2012-05-14 ENCOUNTER — Telehealth: Payer: Self-pay | Admitting: Cardiovascular Disease

## 2012-05-14 MED ORDER — AMLODIPINE-VALSARTAN-HCTZ 10-160-25 MG PO TABS: 0.5000 | ORAL_TABLET | Freq: Two times a day (BID) | ORAL | Status: DC

## 2012-05-14 NOTE — Telephone Encounter (Signed)
Notified patient no samples available for amlodipine valsartan hctz 10-160-25 mg. New Rx sent to [facility].

## 2012-05-14 NOTE — Telephone Encounter (Signed)
PATIENT CALLED WANTING SAMPLE OF AMLODIPINE-VALSARTAN-HCTZ 10-160-25 MG, PLEASE CALL HIM BACK AND LET HIM KNOW WHEN HE CAN PICK UP, IF PATIENT IS NOT THERE HE SAID OK TO LEAVE MESSAGE WITH HIS WIFE

## 2012-05-29 DIAGNOSIS — Z96659 Presence of unspecified artificial knee joint: Secondary | ICD-10-CM | POA: Insufficient documentation

## 2012-05-29 DIAGNOSIS — Z96649 Presence of unspecified artificial hip joint: Secondary | ICD-10-CM | POA: Insufficient documentation

## 2012-05-31 DIAGNOSIS — M25559 Pain in unspecified hip: Secondary | ICD-10-CM | POA: Insufficient documentation

## 2012-05-31 DIAGNOSIS — M1610 Unilateral primary osteoarthritis, unspecified hip: Secondary | ICD-10-CM | POA: Insufficient documentation

## 2012-05-31 DIAGNOSIS — M159 Polyosteoarthritis, unspecified: Secondary | ICD-10-CM | POA: Insufficient documentation

## 2012-07-17 ENCOUNTER — Encounter: Payer: Self-pay | Admitting: *Deleted

## 2012-07-17 DIAGNOSIS — Z950 Presence of cardiac pacemaker: Secondary | ICD-10-CM | POA: Insufficient documentation

## 2012-07-26 ENCOUNTER — Encounter: Payer: Self-pay | Admitting: Internal Medicine

## 2012-07-26 ENCOUNTER — Ambulatory Visit (INDEPENDENT_AMBULATORY_CARE_PROVIDER_SITE_OTHER): Payer: Medicare Other | Admitting: Internal Medicine

## 2012-07-26 VITALS — BP 125/71 | HR 77 | Ht 72.0 in | Wt 205.5 lb

## 2012-07-26 DIAGNOSIS — R609 Edema, unspecified: Secondary | ICD-10-CM | POA: Insufficient documentation

## 2012-07-26 DIAGNOSIS — I442 Atrioventricular block, complete: Secondary | ICD-10-CM

## 2012-07-26 LAB — PACEMAKER DEVICE OBSERVATION
AL AMPLITUDE: 2.8 mv
AL THRESHOLD: 0.625 V
RV LEAD AMPLITUDE: 11.2 mv
RV LEAD THRESHOLD: 0.75 V

## 2012-07-26 MED ORDER — LISINOPRIL 20 MG PO TABS: 20.0000 mg | ORAL_TABLET | Freq: Every day | ORAL | Status: DC

## 2012-07-26 MED ORDER — FUROSEMIDE 20 MG PO TABS: 20.0000 mg | ORAL_TABLET | Freq: Every day | ORAL | Status: DC

## 2012-07-26 NOTE — Patient Instructions (Signed)
Stop taking Exforge Start taking Lasix 20mg  daily Start taking Lisinopril 20mg  daily  Your physician recommends that you schedule a follow-up appointment in: April 2014

## 2012-07-26 NOTE — Assessment & Plan Note (Signed)
As above.

## 2012-07-26 NOTE — Progress Notes (Signed)
  HPI  Randy [name] is a 78 y.o. male seen for pacemaker followup for complete heart block  4/13 undertaken by Dr Leonia Reeves and complicated by micropeforation requiring lead reposition He is othrewise well but c/o  edema which is round on him off for a year or so. He is a difficult to manage blood pressure is mostly related to drug intolerances I guess as well for control. He has been managed with Exforge. His cost $100 a month.  Review of his diet includes multiple salt-containing foods including cheeses meats  and sandwiches    Past Medical History  Diagnosis Date  . Hypertension   . Depression   . Anxiety   . Tremor   . Memory deficit 03/24/2011  . Pacemaker   . Shortness of breath   . Complete heart block   . Blood transfusion     hx of autologus transfusion  . Neuromuscular disorder     peripheral  neuropathy  . Arthritis     Past Surgical History  Procedure Date  . Knee surgery [phone]  . Replacement total knee bilateral 1997  . Umbilical hernia repair 2001  . Cataract extraction 06/2005  . Rotator cuff repair 06/2008  . Total hip arthroplasty 2009    right  . Defibrilator 04/04/2012    Dual Chamber  . Insert / replace / remove pacemaker 04/04/2012    Current Outpatient Prescriptions  Medication Sig Dispense Refill  . Amlodipine-Valsartan-HCTZ (EXFORGE HCT) 5-160-12.5 MG TABS Take by mouth daily.      Marland Kitchen aspirin 81 MG EC tablet Take 81 mg by mouth daily.        Marland Kitchen desvenlafaxine (PRISTIQ) 50 MG 24 hr tablet Take 50 mg by mouth daily.        Marland Kitchen gabapentin (NEURONTIN) 100 MG capsule Take 100 mg by mouth 3 (three) times daily.        Allergies  Allergen Reactions  . Codeine Nausea And Vomiting  . Lodine (Etodolac) Rash  . Percocet (Oxycodone-Acetaminophen) Rash    Tolerates tylenol    Review of Systems negative except from HPI and PMH  Physical Exam BP 125/71  Pulse 77  Ht 6' (1.829 m)  Wt 205 lb 8 oz (93.214 kg)  BMI 27.87 kg/m2 Well developed  and well nourished in no acute distress HENT normal E scleral and icterus clear Neck Supple JVP flat; carotids brisk and full Clear to ausculation Device pocket well healed; without hematoma or erythema  Regular rate and rhythm, no murmurs gallops or rub Soft with active bowel sounds No clubbing cyanosis none Edema Alert and oriented, grossly normal motor and sensory function Skin Warm and Dry    Assessment and  Plan

## 2012-07-26 NOTE — Assessment & Plan Note (Signed)
Stable post pacing 

## 2012-07-26 NOTE — Assessment & Plan Note (Signed)
The patient's device was interrogated and the information was fully reviewed.  The device was reprogrammed to  aximize longevity

## 2012-07-26 NOTE — Assessment & Plan Note (Signed)
The patient has well-controlled blood pressure today. He is on very expensive combination medication which may be contributing to his edema i.e. amlodipine. We discussed a variety of treatment options including changing his amlodipine, trying generics at low cost. He is interested in pursuing this.  To that end I have asked him to hold his exforge. I have given her prescriptions for lisinopril 20 and Lasix 20.  He will followup with Dr. Burnett Sheng. We will see him again in the spring. I've also advised to decrease his sodium intake as

## 2012-09-12 ENCOUNTER — Encounter: Payer: Self-pay | Admitting: Cardiovascular Disease

## 2012-09-12 ENCOUNTER — Ambulatory Visit (INDEPENDENT_AMBULATORY_CARE_PROVIDER_SITE_OTHER): Payer: Medicare Other | Admitting: Cardiovascular Disease

## 2012-09-12 VITALS — BP 162/98 | HR 76 | Ht 72.0 in | Wt 201.2 lb

## 2012-09-12 DIAGNOSIS — Z950 Presence of cardiac pacemaker: Secondary | ICD-10-CM

## 2012-09-12 DIAGNOSIS — I442 Atrioventricular block, complete: Secondary | ICD-10-CM

## 2012-09-12 DIAGNOSIS — I10 Essential (primary) hypertension: Secondary | ICD-10-CM

## 2012-09-12 DIAGNOSIS — R609 Edema, unspecified: Secondary | ICD-10-CM

## 2012-09-12 MED ORDER — FUROSEMIDE 20 MG PO TABS: 20.0000 mg | ORAL_TABLET | Freq: Every day | ORAL | Status: DC | PRN

## 2012-09-12 MED ORDER — CLONIDINE HCL 0.1 MG PO TABS: 0.1000 mg | ORAL_TABLET | Freq: Two times a day (BID) | ORAL | Status: DC

## 2012-09-12 NOTE — Assessment & Plan Note (Signed)
No longer an issue by holding amlodipine. We'll suggest take Lasix when necessary.

## 2012-09-12 NOTE — Assessment & Plan Note (Addendum)
Followed by Dr. Steve Klein  

## 2012-09-12 NOTE — Patient Instructions (Signed)
You are doing well. Please start clonidine one pill in the AM and PM Only take lasix as needed for ankle swelling Stay on the lisinopril  Please call us if you have new issues that need to be addressed before your next appt.  Your physician wants you to follow-up in: 6 months.  You will receive a reminder letter in the mail two months in advance. If you don't receive a letter, please call our office to schedule the follow-up appointment.

## 2012-09-12 NOTE — Progress Notes (Signed)
Patient ID: Randy [name], male    DOB: 12-22-1933, 78 y.o.   MRN: [id number]  HPI Comments: Randy [name] is a very pleasant 78 year old gentleman with a long history of hypertension, possible depression and anxiety, tremor, difficulty tolerating various blood pressure medications secondary to side effects, Patient of Dr. Burnett Sheng, who presents for routine followup.  He was recently seen by Dr. Graciela Husbands for pacemaker followup given recent complete heart block April 2013. He did comment on the cost of exforge HCT and mild edema. The medication was changed to lisinopril 20 mg daily with Lasix 20 mg daily. He has not had any further edema. Blood pressure has been running high at home. His wife is concerned that he is not drinking enough fluids at baseline. He does report having a recent fall onto concrete and he bruised his knees. Otherwise he feels well.   EKG shows paced rhythm with rate 76 beats per minute  Outpatient Encounter Prescriptions as of 09/12/2012  Medication Sig Dispense Refill  . aspirin 81 MG EC tablet Take 81 mg by mouth daily.        Marland Kitchen desvenlafaxine (PRISTIQ) 50 MG 24 hr tablet Take 50 mg by mouth daily.        . furosemide (LASIX) 20 MG tablet Take 1 tablet (20 mg total) by mouth daily as needed.  30 tablet  11  . lisinopril (PRINIVIL,ZESTRIL) 20 MG tablet Take 1 tablet (20 mg total) by mouth daily.  30 tablet  11  .  since furosemide (LASIX) 20 MG tablet Take 1 tablet (20 mg total) by mouth daily.  30 tablet  11  . cloNIDine (CATAPRES) 0.1 MG tablet Take 1 tablet (0.1 mg total) by mouth 2 (two) times daily.  60 tablet  11    Review of Systems  HENT: Negative.   Eyes: Negative.   Respiratory: Negative.   Cardiovascular: Negative.   Gastrointestinal: Negative.   Musculoskeletal: Negative.   Skin: Negative.   Hematological: Negative.   Psychiatric/Behavioral: Positive for disturbed wake/sleep cycle and dysphoric mood.       Memory problems  All other systems  reviewed and are negative.    BP 162/98  Pulse 76  Ht 6' (1.829 m)  Wt 201 lb 4 oz (91.286 kg)  BMI 27.29 kg/m2  Physical Exam  Nursing note and vitals reviewed. Constitutional: He is oriented to person, place, and time. He appears well-developed and well-nourished.  HENT:  Head: Normocephalic.  Nose: Nose normal.  Mouth/Throat: Oropharynx is clear and moist.  Eyes: Conjunctivae normal are normal. Pupils are equal, round, and reactive to light.  Neck: Normal range of motion. Neck supple. No JVD present.  Cardiovascular: Normal rate, regular rhythm, S1 normal, S2 normal, normal heart sounds and intact distal pulses.  Exam reveals no gallop and no friction rub.   No murmur heard. Pulmonary/Chest: Effort normal and breath sounds normal. No respiratory distress. He has no wheezes. He has no rales. He exhibits no tenderness.  Abdominal: Soft. Bowel sounds are normal. He exhibits no distension. There is no tenderness.  Musculoskeletal: Normal range of motion. He exhibits no edema and no tenderness.  Lymphadenopathy:    He has no cervical adenopathy.  Neurological: He is alert and oriented to person, place, and time. Coordination normal.  Skin: Skin is warm and dry. No rash noted. No erythema.  Psychiatric: He has a normal mood and affect. His behavior is normal. Judgment and thought content normal.  Assessment and Plan

## 2012-09-12 NOTE — Assessment & Plan Note (Signed)
Pacemaker in place. Appears to be doing well.

## 2012-09-12 NOTE — Assessment & Plan Note (Addendum)
Blood pressure is high. Uncertain as to whether amlodipine was causing significant edema or if this was mild. I'm concerned that he does not drink much fluids and he is taking Lasix daily with no potassium. We have suggested he only takes Lasix when necessary for edema. We have suggested he continue on lisinopril and start clonidine 0.1 mg twice a day for better blood pressure control. If needed, we could use HCTZ for blood pressure or raise the clonidine dose.

## 2013-01-26 ENCOUNTER — Other Ambulatory Visit: Payer: Self-pay

## 2013-04-03 ENCOUNTER — Ambulatory Visit (INDEPENDENT_AMBULATORY_CARE_PROVIDER_SITE_OTHER): Payer: Medicare Other | Admitting: Cardiovascular Disease

## 2013-04-03 ENCOUNTER — Encounter: Payer: Self-pay | Admitting: Cardiovascular Disease

## 2013-04-03 VITALS — BP 124/74 | HR 60 | Ht 71.0 in | Wt 200.8 lb

## 2013-04-03 DIAGNOSIS — I10 Essential (primary) hypertension: Secondary | ICD-10-CM

## 2013-04-03 DIAGNOSIS — R609 Edema, unspecified: Secondary | ICD-10-CM

## 2013-04-03 DIAGNOSIS — I442 Atrioventricular block, complete: Secondary | ICD-10-CM

## 2013-04-03 DIAGNOSIS — Z950 Presence of cardiac pacemaker: Secondary | ICD-10-CM

## 2013-04-03 MED ORDER — HYDROCHLOROTHIAZIDE 25 MG PO TABS: 25.0000 mg | ORAL_TABLET | Freq: Every day | ORAL | Status: DC

## 2013-04-03 MED ORDER — LISINOPRIL 40 MG PO TABS: 40.0000 mg | ORAL_TABLET | Freq: Every day | ORAL | Status: DC

## 2013-04-03 NOTE — Assessment & Plan Note (Addendum)
Blood pressure continues to trend higher. He does have some fatigue during the daytime though this could be secondary to nocturia. Uncertain if it is the clonidine. No symptoms on HCTZ. We have suggested he start to take HCTZ on a more regular basis such as 3 times per week. He does not drink much fluids and we will not do it everyday to avoid dehydration. We'll increase her lisinopril to 40 mg daily, continue clonidine. If he continues to have hypertension, clonidine could be increased to 0.2 mg twice a day. We will avoid calcium channel blockers given previous lower extremity edema. Avoid beta blockers given low heart rate today.

## 2013-04-03 NOTE — Progress Notes (Signed)
Patient ID: Randy [name], male    DOB: 1934/05/07, 79 y.o.   MRN: [id number]  HPI Comments: Randy [name] is a very pleasant 79-year-old gentleman with a long history of hypertension, possible depression and anxiety, tremor, difficulty tolerating various blood pressure medications secondary to side effects, Patient of Dr. Burnett Sheng, who presents for routine followup.  Previously seen by Dr. Graciela Husbands  for pacemaker followup given complete heart block April 2013. Was previously taking exforge HCT and had mild edema. Because of expense, the medication was changed to lisinopril 20 mg daily with Lasix 20 mg daily.   wife was concerned about him being on a diuretic as he did not drink much fluids. On his clinic visit in October 2013, we changed him to clonidine 0.1 mg twice a day, lisinopril 20 mg daily, HCTZ periodically.   On his visit today, he is not taking the HCTZ very much. Blood pressure continues to trend high typically in the 160-170 range systolic. He is having problems with nocturia, occasional accidents.  Recent lab work shows normal creatinine 1.0, normal potassium 4.2, total cholesterol 170, LDL 97   EKG shows paced rhythm with rate 60 beats per minute  Outpatient Encounter Prescriptions as of 04/03/2013  Medication Sig Dispense Refill  . aspirin 81 MG EC tablet Take 81 mg by mouth daily.        . cloNIDine (CATAPRES) 0.1 MG tablet Take 1 tablet (0.1 mg total) by mouth 2 (two) times daily.  60 tablet  11  . desvenlafaxine (PRISTIQ) 50 MG 24 hr tablet Take 50 mg by mouth daily.        .  hydrochlorothiazide (HYDRODIURIL) 25 MG tablet Take 12.5 mg by mouth daily as needed.       Marland Kitchen  lisinopril (PRINIVIL,ZESTRIL) 20 MG tablet Take 1 tablet (20 mg total) by mouth daily.  30 tablet  11     Review of Systems  Constitutional: Negative.   HENT: Negative.   Eyes: Negative.   Respiratory: Negative.   Cardiovascular: Negative.   Gastrointestinal: Negative.   Genitourinary:   Nocturia  Musculoskeletal: Negative.   Skin: Negative.   Neurological: Negative.   Psychiatric/Behavioral: Positive for sleep disturbance and dysphoric mood.       Memory problems  All other systems reviewed and are negative.    BP 124/74  Pulse 60  Ht 5\' 11"  (1.803 m)  Wt 200 lb 12 oz (91.06 kg)  BMI 28.01 kg/m2  Physical Exam  Nursing note and vitals reviewed. Constitutional: He is oriented to person, place, and time. He appears well-developed and well-nourished.  HENT:  Head: Normocephalic.  Nose: Nose normal.  Mouth/Throat: Oropharynx is clear and moist.  Eyes: Conjunctivae are normal. Pupils are equal, round, and reactive to light.  Neck: Normal range of motion. Neck supple. No JVD present.  Cardiovascular: Normal rate, regular rhythm, S1 normal, S2 normal, normal heart sounds and intact distal pulses.  Exam reveals no gallop and no friction rub.   No murmur heard. Pulmonary/Chest: Effort normal and breath sounds normal. No respiratory distress. He has no wheezes. He has no rales. He exhibits no tenderness.  Abdominal: Soft. Bowel sounds are normal. He exhibits no distension. There is no tenderness.  Musculoskeletal: Normal range of motion. He exhibits no edema and no tenderness.  Lymphadenopathy:    He has no cervical adenopathy.  Neurological: He is alert and oriented to person, place, and time. Coordination normal.  Skin: Skin is warm and dry. No rash noted.  No erythema.  Psychiatric: He has a normal mood and affect. His behavior is normal. Judgment and thought content normal.      Assessment and Plan

## 2013-04-03 NOTE — Patient Instructions (Addendum)
Please take HCTZ  three times a week (monday, Wednesday Friday) Take lisinopril 40 mg daily (or 20 mg twice a day) Stay on clonidine 0.1 mg twice a day  Track your blood pressures Call the office if your pressures run high  Please call us if you have new issues that need to be addressed before your next appt.  Your physician wants you to follow-up in: 6 months.  You will receive a reminder letter in the mail two months in advance. If you don't receive a letter, please call our office to schedule the follow-up appointment.

## 2013-04-03 NOTE — Assessment & Plan Note (Signed)
Pacemaker in place, running at 60 beats per minute.

## 2013-04-03 NOTE — Assessment & Plan Note (Signed)
No significant edema today. Likely complication from calcium channel blocker use in the past.

## 2013-05-02 ENCOUNTER — Encounter: Payer: Self-pay | Admitting: Internal Medicine

## 2013-05-02 ENCOUNTER — Ambulatory Visit (INDEPENDENT_AMBULATORY_CARE_PROVIDER_SITE_OTHER): Payer: Medicare Other | Admitting: Internal Medicine

## 2013-05-02 VITALS — BP 110/72 | HR 72 | Ht 71.0 in | Wt 197.0 lb

## 2013-05-02 DIAGNOSIS — Z950 Presence of cardiac pacemaker: Secondary | ICD-10-CM

## 2013-05-02 DIAGNOSIS — I442 Atrioventricular block, complete: Secondary | ICD-10-CM

## 2013-05-02 LAB — PACEMAKER DEVICE OBSERVATION
AL AMPLITUDE: 4 mv
AL IMPEDENCE PM: 552 Ohm
AL THRESHOLD: 0.75 V
ATRIAL PACING PM: 36
RV LEAD IMPEDENCE PM: 627 Ohm
RV LEAD THRESHOLD: 0.75 V

## 2013-05-02 NOTE — Patient Instructions (Addendum)

## 2013-05-02 NOTE — Assessment & Plan Note (Signed)
We'll reprogram device with a longer AV delay to allow for intrinsic conduction other parameters were stable

## 2013-05-02 NOTE — Assessment & Plan Note (Signed)
Intermittent. Stable post pacing 

## 2013-05-02 NOTE — Progress Notes (Signed)
Patient Care Team: [name], MD as PCP - General (Family Medicine)   HPI  Randy [name] is a 79 y.o. male seen for pacemaker followup for complete heart block implanted  4/13  and complicated by micropeforation requiring lead reposition \  ddoing well except taking more NSAIDs or arthritis     Past Medical History  Diagnosis Date  . Hypertension   . Depression   . Anxiety   . Tremor   . Memory deficit 03/24/2011  . Pacemaker   . Shortness of breath   . Complete heart block   . Blood transfusion     hx of autologus transfusion  . Neuromuscular disorder     peripheral  neuropathy  . Arthritis     Past Surgical History  Procedure Laterality Date  . Knee surgery  [phone]  . Replacement total knee bilateral  1997  . Umbilical hernia repair  2001  . Cataract extraction  06/2005  . Rotator cuff repair  06/2008  . Total hip arthroplasty  2009    right  . Defibrilator  04/04/2012    Dual Chamber  . Insert / replace / remove pacemaker  04/04/2012    Current Outpatient Prescriptions  Medication Sig Dispense Refill  . cloNIDine (CATAPRES) 0.1 MG tablet Take 1 tablet (0.1 mg total) by mouth 2 (two) times daily.  60 tablet  11  . desvenlafaxine (PRISTIQ) 50 MG 24 hr tablet Take 50 mg by mouth daily.        . hydrochlorothiazide (HYDRODIURIL) 25 MG tablet Take 25 mg by mouth as directed. Takes [place]. Wed. And Friday.      Marland Kitchen lisinopril (PRINIVIL,ZESTRIL) 40 MG tablet Take 1 tablet (40 mg total) by mouth daily.  30 tablet  11  . aspirin 81 MG EC tablet Take 81 mg by mouth daily.         No current facility-administered medications for this visit.    Allergies  Allergen Reactions  . Codeine Nausea And Vomiting  . Lodine (Etodolac) Rash  . Percocet (Oxycodone-Acetaminophen) Rash    Tolerates tylenol    Review of Systems negative except from HPI and PMH  Physical Exam BP 110/72  Pulse 72  Ht 5\' 11"  (1.803 m)  Wt 197 lb (89.359 kg)  BMI 27.49  kg/m2 Well developed and nourished in no acute distress HENT normal Neck supple with JVP-flat Clear Device pocket well healed; without hematoma or erythema  Regular rate and rhythm, no murmurs or gallops Abd-soft with active BS No Clubbing cyanosis edema Skin-warm and dry A & Oriented  Grossly normal sensory and motor function  v   Assessment and  Plan

## 2013-05-14 ENCOUNTER — Encounter (INDEPENDENT_AMBULATORY_CARE_PROVIDER_SITE_OTHER): Payer: Medicare Other

## 2013-05-14 ENCOUNTER — Ambulatory Visit (INDEPENDENT_AMBULATORY_CARE_PROVIDER_SITE_OTHER): Payer: Medicare Other | Admitting: *Deleted

## 2013-05-14 DIAGNOSIS — I442 Atrioventricular block, complete: Secondary | ICD-10-CM

## 2013-05-14 LAB — PACEMAKER DEVICE OBSERVATION
AL IMPEDENCE PM: 589 Ohm
BATTERY VOLTAGE: 2.79 V
VENTRICULAR PACING PM: 95.3

## 2013-05-14 NOTE — Patient Instructions (Addendum)
Come back this afternoon for device check

## 2013-05-14 NOTE — Progress Notes (Signed)
Pt walked in office with questions re:his PPM home monitor i was able to answer his questions and explain how to hook this up at home and use machine  He mentions he has been feeling more fatigued and DOE since the "change" that was made to his PPM last OV It appears Dr. Graciela Husbands prolonged his AV conduction to allow for more intrinsic conduction. I spoke with Baxter Hire in [facility] device clinic who advised he come here this PM for device check to have these settings evaluated closer. Pt coming back this afternoon for device check only at 3-3:30 pm

## 2013-05-14 NOTE — Progress Notes (Signed)
PPM check done in office.

## 2013-06-05 ENCOUNTER — Encounter: Payer: Self-pay | Admitting: Internal Medicine

## 2013-07-16 ENCOUNTER — Telehealth: Payer: Self-pay | Admitting: Neurology

## 2013-07-16 NOTE — Telephone Encounter (Signed)
Prior Dr. Love pt has not been seen by Dr. Athar needs to be reassign per Dr. Athar °

## 2013-07-17 ENCOUNTER — Other Ambulatory Visit: Payer: Self-pay

## 2013-07-17 NOTE — Telephone Encounter (Signed)
Assigned to Dr. Willis. 

## 2013-08-05 ENCOUNTER — Ambulatory Visit (INDEPENDENT_AMBULATORY_CARE_PROVIDER_SITE_OTHER): Payer: Medicare Other | Admitting: Neurology

## 2013-08-05 ENCOUNTER — Encounter: Payer: Self-pay | Admitting: Neurology

## 2013-08-05 ENCOUNTER — Ambulatory Visit (INDEPENDENT_AMBULATORY_CARE_PROVIDER_SITE_OTHER): Payer: Medicare Other | Admitting: *Deleted

## 2013-08-05 VITALS — BP 136/87 | HR 64 | Temp 97.1°F | Ht 71.0 in | Wt 201.5 lb

## 2013-08-05 DIAGNOSIS — I442 Atrioventricular block, complete: Secondary | ICD-10-CM

## 2013-08-05 DIAGNOSIS — R5381 Other malaise: Secondary | ICD-10-CM

## 2013-08-05 DIAGNOSIS — G20C Parkinsonism, unspecified: Secondary | ICD-10-CM

## 2013-08-05 DIAGNOSIS — G4752 REM sleep behavior disorder: Secondary | ICD-10-CM

## 2013-08-05 DIAGNOSIS — Z950 Presence of cardiac pacemaker: Secondary | ICD-10-CM

## 2013-08-05 DIAGNOSIS — R531 Weakness: Secondary | ICD-10-CM

## 2013-08-05 DIAGNOSIS — G609 Hereditary and idiopathic neuropathy, unspecified: Secondary | ICD-10-CM

## 2013-08-05 DIAGNOSIS — G20 Parkinson's disease: Secondary | ICD-10-CM

## 2013-08-05 NOTE — Progress Notes (Signed)
Subjective:    Patient ID: Randy [name] is a 79 y.o. male.  HPI  Interim history:   Randy [name] is a very pleasant 79 year old right-handed gentleman who presents for followup consultation of his lower extremity weakness and neuropathy. He is accompanied by his wife again today. I first met him on 02/05/2013 and he previously followed with Dr. Avie Echevaria. He has a history of bilateral lower extremity weakness. He is status post multiple knee surgeries in 1974, 1979, 1996, and status post knee replacement surgeries in 1997, status post right total hip replacement surgery in 2009, who has had over the past several years weakness and soreness in his proximal lower extremities, going on for at least 5 years. His weakness has not been very progressive. MRI lumbar spine in March 2009 showed bilateral neural foraminal narrowing at L5-S1 and possible nerve root compromise on the left at L4-5. EMG and nerve conduction studies on 11/09/2010 was unremarkable except for slightly prolonged at wave latencies. There was no evidence of myopathy, he had workup including serum electrophoresis, TSH, ESR, CK, B12 all normal in November 2011. 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 Sandria Manly in July 2013 at which time Dr. Sandria Manly suggested a one year followup because the patient was deemed stable. The patient presented for a sooner than scheduled followup on 02/05/2013 as he had some medication changes. He had stopped Pristiq for the second time but after 3 months realize that his anxiety and depression flared up and restarted it in January. He has had a change in his blood pressure medication and reported that he may need shoulder surgery. He has had tremors which have been stable. He is no longer taking ASA at this time and takes the HCTZ 12.5 mg daily. He was noticing excess bruising with the ASA. He has a tendency to act out in his dreams and this happens about 2/week. He has not  fallen recently, but did fall out of bed 4-5 weeks ago.   He has noted more fine motor difficulties, especially on the L, with buttoning and with getting his wallet out of the L back pocket of his pants. He has had some progression of his hand tremors. His neuropathy is about the same he feels. His weakness and thigh muscle wasting is stable as well.   His Past Medical History Is Significant For: Past Medical History  Diagnosis Date  . Hypertension   . Depression   . Anxiety   . Tremor   . Memory deficit 03/24/2011  . Pacemaker   . Shortness of breath   . Complete heart block   . Blood transfusion     hx of autologus transfusion  . Neuromuscular disorder     peripheral  neuropathy  . Arthritis    His Past Surgical History Is Significant For: Past Surgical History  Procedure Laterality Date  . Knee surgery  [phone]  . Replacement total knee bilateral  1997  . Umbilical hernia repair  2001  . Cataract extraction  06/2005  . Rotator cuff repair  06/2008  . Total hip arthroplasty  2009    right  . Defibrilator  04/04/2012    Dual Chamber  . Insert / replace / remove pacemaker  04/04/2012   His Family History Is Significant For: Family History  Problem Relation Age of Onset  . Hypertension Mother   . Hypertension Father   . Seizures Father     epilepsy  .  Prostate cancer Other   . Cancer Other   . Hypertension Other   . Asthma Other    His Social History Is Significant For: History   Social History  . Marital Status: Married    Spouse Name: Polly    Number of Children: 3  . Years of Education: College   Occupational History  . Retired    Social History Main Topics  . Smoking status: Former Smoker -- 1.00 packs/day for 18 years    Types: Cigarettes    Quit date: 03/20/1973  . Smokeless tobacco: Never Used  . Alcohol Use: Yes     Comment: occasional  . Drug Use: No  . Sexual Activity: Not Currently   Other Topics Concern  . None   Social History  Narrative   Patient lives at home with spouse.   Caffeine Use: 2 cups of coffee daily    His Allergies Are:  Allergies  Allergen Reactions  . Codeine Nausea And Vomiting  . Lodine [Etodolac] Rash  . Percocet [Oxycodone-Acetaminophen] Rash    Tolerates tylenol  :  His Current Medications Are:  Outpatient Encounter Prescriptions as of 08/05/2013  Medication Sig Dispense Refill  . cloNIDine (CATAPRES) 0.1 MG tablet Take 1 tablet (0.1 mg total) by mouth 2 (two) times daily.  60 tablet  11  . desvenlafaxine (PRISTIQ) 50 MG 24 hr tablet Take 50 mg by mouth daily.        . hydrochlorothiazide (HYDRODIURIL) 25 MG tablet Take 25 mg by mouth as directed. Takes [place]. Wed. And Friday.      Marland Kitchen lisinopril (PRINIVIL,ZESTRIL) 40 MG tablet Take 1 tablet (40 mg total) by mouth daily.  30 tablet  11  . aspirin 81 MG EC tablet Take 81 mg by mouth daily.         No facility-administered encounter medications on file as of 08/05/2013.  :  Review of Systems  Constitutional: Positive for fatigue.  Eyes:       Blurred vision Double Vision  Respiratory: Positive for cough.   Genitourinary: Positive for difficulty urinating (urination problems).       Impotence  Musculoskeletal: Positive for arthralgias.  Neurological: Positive for tremors, weakness and numbness.  Hematological: Bruises/bleeds easily.  Psychiatric/Behavioral:       Depression    Objective:  Neurologic Exam  Physical Exam Physical Examination:   Filed Vitals:   08/05/13 1103  BP: 136/87  Pulse: 64  Temp: 97.1 F (36.2 C)    General Examination: The patient is a very pleasant 79 y.o. male in no acute distress. He appears well-developed and well-nourished and very well groomed.  HEENT exam: Normocephalic, atraumatic, pupils are equal, round and reactive to light and accommodation. Extraocular tracking is shows mild saccadic breakdown without nystagmus. Hearing is mildly impaired. Neck is mildly rigid with full range of motion. He  has no lip, neck or jaw tremor. Oropharynx examination reveals a mild to moderately tight airway primarily secondary to enlarged uvula which also is elongated and reach as far back down. Face is symmetric with perhaps mild facial masking. Palate elevates symmetrically and tongue protrudes centrally. He has a Mallampati class II. Chest is clear to auscultation, heart sounds are normal without murmurs, rubs or gallops, abdomen is soft, with normal bowel sounds. He has trace pitting edema around both ankles. Skin is warm and dry and he has some discoloration in the distal lower extremities.  Neurologically: Mental status: The patient is awake, alert and oriented in all 4  spheres. He is able to give a good history. His memory, attention, language and knowledge are appropriate. Cranial nerves are as described under HEENT exam. Motor exam: He does have a mildly decreased bulk in both thighs. Otherwise bulk, tone and strength is normal with the exception of mild hip flexor weakness bilaterally. He also reports left hip pain. Reflexes are 1+ in the upper extremities, 2+ in both knees and absent in both ankles. Cerebellar testing shows no dysmetria or intention tremor. He has no resting tremor. He does have a mild degree of postural tremor in the left upper extremity and a minimal degree of postural tremor in the right upper extremity. The same is true for action tremor which is mild on the left and minimal on the right. Finger taps are mildly impaired bilaterally, more on the L. Hand movements and rapid alternating patting is appropriate, slightly worse on the L. Foot agility is mildly impaired on the L. Romberg testing shows mild swaying but no corrective steps. Sensory exam is decreased for vibration sense, temperature, pinprick and light touch in the distal lower extremities bilaterally in the distal lower one third, unchanged from last time.    Assessment and Plan:    In summary, Mr. Lampert is a very pleasant  79 year old gentleman with a history of significant osteoarthritis affecting multiple joints, a history of lower extremity weakness as well as peripheral neuropathy, nondiabetic. He has recently had some changes in his fine motor function and I detect mild L sided parkinsonian sings today with an otherwise unchanged exam. I discussed with him and his wife the possibility that he has mild Parkinson's disease affecting his left side. 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 I think his presentation is rather mild with respect to the parkinsonian signs. At this juncture I would like to watch this and follow him clinically for this. I did not suggest any new medications and they are in agreement. Last time I felt he did not have any parkinsonian signs and in the past Dr. Sandria Manly had also reassured him that he does not have Parkinson's but at this juncture I think we have to entertain the possibility that he has developed some parkinsonism affecting his left side. Nevertheless, I think this is really mild and can be watched clinically. A like to see him back in 6 months from now, sooner if the need arises. He is advised to use a cane if needed if he feels unsteady. He may develop a gait disorder and problems with his balance down the Road which I advised to mouth. He and his wife were in agreement with the plan and they are encouraged to call with any interim questions, concerns, problems or updates.

## 2013-08-05 NOTE — Patient Instructions (Addendum)
I think overall you are doing fairly well but I do want to suggest a few things today:  Remember to drink plenty of fluid, eat healthy meals and do not skip any meals. Try to eat protein with a every meal and eat a healthy snack such as fruit or nuts in between meals. Try to keep a regular sleep-wake schedule and try to exercise daily, particularly in the form of walking, 20-30 minutes a day, if you can.   As far as your medications are concerned, I would like to suggest  No changes today. We may try medicine for mild Parkinson's disease down the road.   As far as diagnostic testing: no new test.   I would like to see you back in 6 months, sooner if we need to. Please call us with any interim questions, concerns, problems, updates or refill requests.  Brett Canales is my clinical assistant and will answer any of your questions and relay your messages to me and also relay most of my messages to you.  Our phone number is [phone]. We also have an after hours call service for urgent matters and there is a physician on-call for urgent questions. For any emergencies you know to call 911 or go to the nearest emergency room.

## 2013-08-16 LAB — REMOTE PACEMAKER DEVICE
ATRIAL PACING PM: 39
RV LEAD IMPEDENCE PM: 657 Ohm
VENTRICULAR PACING PM: 100

## 2013-08-23 NOTE — Progress Notes (Signed)
ppm remote check. All functions normal, full details in PaceArt.  1 NSVT---12 beats.  [facility] 11/11/13 & ROV w/ Dr. Graciela Husbands 04/2014

## 2013-09-03 ENCOUNTER — Encounter: Payer: Self-pay | Admitting: *Deleted

## 2013-09-09 ENCOUNTER — Other Ambulatory Visit: Payer: Self-pay | Admitting: *Deleted

## 2013-09-09 MED ORDER — CLONIDINE HCL 0.1 MG PO TABS: 0.1000 mg | ORAL_TABLET | Freq: Two times a day (BID) | ORAL | Status: DC

## 2013-09-09 NOTE — Telephone Encounter (Signed)
Refilled Clonidine sent to [facility].

## 2013-09-12 ENCOUNTER — Encounter: Payer: Self-pay | Admitting: Internal Medicine

## 2013-10-07 DIAGNOSIS — R269 Unspecified abnormalities of gait and mobility: Secondary | ICD-10-CM | POA: Insufficient documentation

## 2013-10-07 DIAGNOSIS — G20 Parkinson's disease: Secondary | ICD-10-CM | POA: Insufficient documentation

## 2013-10-11 ENCOUNTER — Ambulatory Visit (INDEPENDENT_AMBULATORY_CARE_PROVIDER_SITE_OTHER): Payer: Medicare Other | Admitting: Cardiovascular Disease

## 2013-10-11 ENCOUNTER — Encounter: Payer: Self-pay | Admitting: Cardiovascular Disease

## 2013-10-11 VITALS — BP 117/78 | HR 83 | Ht 71.0 in | Wt 203.0 lb

## 2013-10-11 DIAGNOSIS — I442 Atrioventricular block, complete: Secondary | ICD-10-CM

## 2013-10-11 DIAGNOSIS — R413 Other amnesia: Secondary | ICD-10-CM

## 2013-10-11 DIAGNOSIS — Z950 Presence of cardiac pacemaker: Secondary | ICD-10-CM

## 2013-10-11 DIAGNOSIS — G20 Parkinson's disease: Secondary | ICD-10-CM

## 2013-10-11 DIAGNOSIS — R079 Chest pain, unspecified: Secondary | ICD-10-CM

## 2013-10-11 DIAGNOSIS — I10 Essential (primary) hypertension: Secondary | ICD-10-CM

## 2013-10-11 NOTE — Patient Instructions (Signed)
You are doing well. No medication changes were made.  Please call us if you have new issues that need to be addressed before your next appt.  Your physician wants you to follow-up in: 6 months.  You will receive a reminder letter in the mail two months in advance. If you don't receive a letter, please call our office to schedule the follow-up appointment.   

## 2013-10-11 NOTE — Assessment & Plan Note (Signed)
He has seen urology. Likely has underlying cognitive deficit. Recommended daily exercise program.

## 2013-10-11 NOTE — Assessment & Plan Note (Signed)
Blood pressure is well controlled on today's visit. No changes made to the medications. 

## 2013-10-11 NOTE — Assessment & Plan Note (Signed)
We spent much of the visit talking about his need for regular exercise. Legs are weak, needs assistance getting out of a chair. Suggested they join [employer] and go to the gym daily

## 2013-10-11 NOTE — Assessment & Plan Note (Signed)
No significant chest pain on today's visit

## 2013-10-11 NOTE — Progress Notes (Signed)
Patient ID: Randy [name], male    DOB: [birth year], 79 y.o.   MRN: [id number]  HPI Comments: Mr. Randy [name] is a very pleasant 79 year old gentleman with a long history of hypertension, possible depression and anxiety, tremor, difficulty tolerating various blood pressure medications secondary to side effects, Patient of Dr. Burnett Sheng, who presents for routine followup.   In followup today, he reports he has been seen by neurology. He may have mild Parkinson's. Reports he needs a left hip replacement. Also has spinal stenosis. He has been refused for surgery given underlying cognitive issues. There is some mild left-sided weakness. Wife reports that he continues to "fight" when he sleeps at nighttime, bad dreams. Legs are weak, needs assistance getting out of a chair. He does not do any regular exercise  He is no longer on aspirin, was told to stop as he is taking ibuprofen and Advil when necessary   Previously seen by Dr. Graciela Husbands  for pacemaker followup given complete heart block April 2013. Reports blood pressure has been well controlled at home  He is having problems with nocturia, occasional accidents.  total cholesterol 170, LDL 97   EKG shows paced rhythm with rate 83 beats per minute  Outpatient Encounter Prescriptions as of 02/21/2013  Medication Sig  . cloNIDine (CATAPRES) 0.1 MG tablet Take 1 tablet (0.1 mg total) by mouth 2 (two) times daily.  Marland Kitchen desvenlafaxine (PRISTIQ) 50 MG 24 hr tablet Take 50 mg by mouth daily.    . hydrochlorothiazide (HYDRODIURIL) 25 MG tablet Take 12.5 mg by mouth daily.   Marland Kitchen lisinopril (PRINIVIL,ZESTRIL) 40 MG tablet Take 1 tablet (40 mg total) by mouth daily.  . [DISCONTINUED] aspirin 81 MG EC tablet Take 81 mg by mouth daily.        Review of Systems  HENT: Negative.   Eyes: Negative.   Respiratory: Negative.   Cardiovascular: Negative.   Gastrointestinal: Negative.   Endocrine: Negative.   Genitourinary:       Nocturia  Musculoskeletal:  Positive for gait problem.  Skin: Negative.   Allergic/Immunologic: Negative.   Neurological: Positive for weakness.  Hematological: Negative.   Psychiatric/Behavioral: Positive for sleep disturbance and dysphoric mood.       Memory problems  All other systems reviewed and are negative.    BP 117/78  Pulse 83  Ht 5\' 11"  (1.803 m)  Wt 203 lb (92.08 kg)  BMI 28.33 kg/m2  Physical Exam  Nursing note and vitals reviewed. Constitutional: He is oriented to person, place, and time. He appears well-developed and well-nourished.  HENT:  Head: Normocephalic.  Nose: Nose normal.  Mouth/Throat: Oropharynx is clear and moist.  Eyes: Conjunctivae are normal. Pupils are equal, round, and reactive to light.  Neck: Normal range of motion. Neck supple. No JVD present.  Cardiovascular: Normal rate, regular rhythm, S1 normal, S2 normal, normal heart sounds and intact distal pulses.  Exam reveals no gallop and no friction rub.   No murmur heard. Pulmonary/Chest: Effort normal and breath sounds normal. No respiratory distress. He has no wheezes. He has no rales. He exhibits no tenderness.  Abdominal: Soft. Bowel sounds are normal. He exhibits no distension. There is no tenderness.  Musculoskeletal: Normal range of motion. He exhibits no edema and no tenderness.  Lymphadenopathy:    He has no cervical adenopathy.  Neurological: He is alert and oriented to person, place, and time. Coordination normal.  Skin: Skin is warm and dry. No rash noted. No erythema.  Psychiatric: He has a normal  mood and affect. His behavior is normal. Judgment and thought content normal.      Assessment and Plan

## 2013-10-11 NOTE — Assessment & Plan Note (Signed)
Pacemaker followed by Dr. Klein 

## 2013-10-22 ENCOUNTER — Encounter: Payer: Self-pay | Admitting: Neurology

## 2013-10-22 ENCOUNTER — Ambulatory Visit (INDEPENDENT_AMBULATORY_CARE_PROVIDER_SITE_OTHER): Payer: Medicare Other | Admitting: Neurology

## 2013-10-22 VITALS — BP 134/77 | HR 77 | Temp 97.0°F | Ht 70.0 in | Wt 205.0 lb

## 2013-10-22 DIAGNOSIS — G609 Hereditary and idiopathic neuropathy, unspecified: Secondary | ICD-10-CM

## 2013-10-22 DIAGNOSIS — G3184 Mild cognitive impairment, so stated: Secondary | ICD-10-CM

## 2013-10-22 DIAGNOSIS — R5381 Other malaise: Secondary | ICD-10-CM

## 2013-10-22 DIAGNOSIS — G20C Parkinsonism, unspecified: Secondary | ICD-10-CM | POA: Insufficient documentation

## 2013-10-22 DIAGNOSIS — R531 Weakness: Secondary | ICD-10-CM

## 2013-10-22 DIAGNOSIS — G4752 REM sleep behavior disorder: Secondary | ICD-10-CM

## 2013-10-22 DIAGNOSIS — G20 Parkinson's disease: Secondary | ICD-10-CM

## 2013-10-22 HISTORY — DX: Parkinson's disease: G20

## 2013-10-22 HISTORY — DX: Mild cognitive impairment of uncertain or unknown etiology: G31.84

## 2013-10-22 HISTORY — DX: Parkinsonism, unspecified: G20.C

## 2013-10-22 NOTE — Progress Notes (Signed)
Subjective:    Patient ID: Randy [name] is a 79 y.o. male.  HPI  Interim history:   Randy [name] is a very pleasant 79 year old right-handed gentleman who presents for followup consultation of his lower extremity weakness and neuropathy. He also has a history of spinal stenosis and was not deemed a surgical candidate. He has significant issues with arthritis in multiple joints and has had multiple surgeries. He is accompanied by his wife again today. His wife requested a sooner than scheduled appointment due to worsening memory and worsening tremors and weaker speech. He denies choking. He has started using a cane in his R hand.  I last saw him on 08/05/2013, and which time I did not make any medication changes. I did note some mild left-sided parkinsonism. He was in the process of closing up a pond and while working on this, he slid into the hole and bruised his L shoulder and the L wrist. He was seen by his orthopedic surgeon for his L hip, but was advised against surgery. He has had dream enactments for the past 4 years. It happens about 1/week. I first met him on 02/05/2013 and he previously followed with Dr. Avie Echevaria. He has a history of bilateral lower extremity weakness. He is status post multiple knee surgeries in 1974, 1979, 1996, and status post knee replacement surgeries in 1997, status post right total hip replacement surgery in 2009, who has had over the past several years weakness and soreness in his proximal lower extremities, going on for at least 5 years. His weakness has not been very progressive. MRI lumbar spine in March 2009 showed bilateral neural foraminal narrowing at L5-S1 and possible nerve root compromise on the left at L4-5. EMG and nerve conduction studies on 11/09/2010 was unremarkable except for slightly prolonged at wave latencies. There was no evidence of myopathy, he had workup including serum electrophoresis, TSH, ESR, CK, B12 all normal in November 2011. 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 Sandria Manly in July 2013 at which time Dr. Sandria Manly suggested a one year followup because the patient was deemed stable. The patient presented for a sooner than scheduled followup on 02/05/2013 as he had some medication changes. He had stopped Pristiq for the second time but after 3 months realize that his anxiety and depression flared up and restarted it in January. He has had a change in his blood pressure medication and reported that he may need shoulder surgery. He has had tremors which have been stable.  He is no longer taking ASA at this time and takes the HCTZ 12.5 mg daily. He noted excess bruising with the ASA. He has a tendency to act out in his dreams and this happens about 2/week. He has not fallen recently, but did fall out of bed 4-5 weeks ago.  He has noted more fine motor difficulties, especially on the L, with buttoning and with getting his wallet out of the L back pocket of his pants. He has had some progression of his hand tremors. His neuropathy is about the same he feels. His weakness and thigh muscle wasting is stable as well.   His Past Medical History Is Significant For: Past Medical History  Diagnosis Date  . Hypertension   . Depression   . Anxiety   . Tremor   . Memory deficit 03/24/2011  . Pacemaker   . Shortness of breath   . Complete heart block   . Blood  transfusion     hx of autologus transfusion  . Neuromuscular disorder     peripheral  neuropathy  . Arthritis   . Parkinson's disease     His Past Surgical History Is Significant For: Past Surgical History  Procedure Laterality Date  . Knee surgery  [phone]  . Replacement total knee bilateral  1997  . Umbilical hernia repair  2001  . Cataract extraction  06/2005  . Rotator cuff repair  06/2008  . Total hip arthroplasty  2009    right  . Defibrilator  04/04/2012    Dual Chamber  . Insert / replace / remove pacemaker  04/04/2012    His  Family History Is Significant For: Family History  Problem Relation Age of Onset  . Hypertension Mother   . Hypertension Father   . Seizures Father     epilepsy  . Prostate cancer Other   . Cancer Other   . Hypertension Other   . Asthma Other     His Social History Is Significant For: History   Social History  . Marital Status: Married    Spouse Name: Randy [name]    Number of Children: 3  . Years of Education: College   Occupational History  . Retired    Social History Main Topics  . Smoking status: Former Smoker -- 1.00 packs/day for 18 years    Types: Cigarettes    Quit date: 03/20/1973  . Smokeless tobacco: Never Used  . Alcohol Use: Yes     Comment: occasional  . Drug Use: No  . Sexual Activity: Not Currently   Other Topics Concern  . None   Social History Narrative   Patient lives at home with spouse.   Caffeine Use: 2 cups of coffee daily    His Allergies Are:  Allergies  Allergen Reactions  . Codeine Nausea And Vomiting  . Lodine [Etodolac] Rash  . Percocet [Oxycodone-Acetaminophen] Rash    Tolerates tylenol  :   His Current Medications Are:  Outpatient Encounter Prescriptions as of 10/22/2013  Medication Sig  . cloNIDine (CATAPRES) 0.1 MG tablet Take 1 tablet (0.1 mg total) by mouth 2 (two) times daily.  Marland Kitchen desvenlafaxine (PRISTIQ) 50 MG 24 hr tablet Take 50 mg by mouth daily.    . hydrochlorothiazide (HYDRODIURIL) 25 MG tablet Take 12.5 mg by mouth daily.   Marland Kitchen lisinopril (PRINIVIL,ZESTRIL) 40 MG tablet Take 1 tablet (40 mg total) by mouth daily.   Review of Systems:  Out of a complete 14 point review of systems, all are reviewed and negative with the exception of these symptoms as listed below:   Review of Systems  Constitutional: Positive for fatigue.  HENT: Negative.   Eyes: Negative.   Respiratory: Positive for shortness of breath.        Snoring  Cardiovascular: Negative.   Gastrointestinal: Negative.   Endocrine: Negative.   Genitourinary:  Negative.   Musculoskeletal: Negative.   Skin: Negative.   Allergic/Immunologic: Negative.   Neurological: Positive for tremors, weakness and numbness.       Memory loss  Hematological: Bruises/bleeds easily.  Psychiatric/Behavioral: Positive for dysphoric mood.    Objective:  Neurologic Exam  Physical Exam Physical Examination:   Filed Vitals:   10/22/13 1455  BP: 134/77  Pulse: 77  Temp: 97 F (36.1 C)   General Examination: The patient is a very pleasant 79 y.o. male in no acute distress. He appears well-developed and well-nourished and well groomed.  HEENT exam: Normocephalic, atraumatic, pupils are equal,  round and reactive to light and accommodation. Extraocular tracking is shows mild saccadic breakdown without nystagmus. Hearing is mildly impaired. Neck is mildly rigid with full range of motion. He has no lip, neck or jaw tremor. Oropharynx examination reveals a mild to moderately tight airway primarily secondary to enlarged uvula which also is elongated and reach as far back down. Face is symmetric with perhaps mild facial masking. Palate elevates symmetrically and tongue protrudes centrally. Speech is mildly hypophonic. He has a slight degree of raspiness in his voice. He has no dysarthria. He has a Mallampati class II. Chest is clear to auscultation, heart sounds are normal without murmurs, rubs or gallops, abdomen is soft, with normal bowel sounds. He has trace pitting edema around both ankles. Skin is warm and dry and he has some discoloration in the distal lower extremities.  Neurologically: Mental status: The patient is awake, alert and oriented in all 4 spheres. He is able to give a good history. His memory, attention, language and knowledge are mildly impaired. While he is fully oriented to time, place, self, circumstance, season, reason for visit, he does demonstrate some problems with serial sevens. His repetition is normal. Cranial nerves are as described under HEENT exam.  Motor exam: He does have a mildly decreased bulk in both thighs. Otherwise bulk, tone and strength is normal with the exception of mild hip flexor weakness bilaterally. He also reports left hip pain. Reflexes are 1+ in the upper extremities, trace in both knees and absent in both ankles. Cerebellar testing shows no dysmetria or intention tremor. He has no resting tremor. He does have a mild degree of postural tremor in the left upper extremity and a minimal degree of postural tremor in the right upper extremity. The same is true for action tremor which is mild on the left and minimal on the right. Finger taps are mildly impaired bilaterally, more on the L. Hand movements and rapid alternating patting is appropriate, slightly worse on the L. Foot agility is mildly impaired on the L. Romberg testing shows mild swaying but no corrective steps. Sensory exam is decreased for vibration sense, temperature, pinprick and light touch in the distal lower extremities bilaterally in the distal lower one third, unchanged.   Assessment and Plan:    In summary, Mr. Sevin is a very pleasant 79 year old gentleman with a history of gait disorder, weakness, neuropathy, in the context of an underlying history of significant osteoarthritis affecting multiple joints, spinal stenosis. 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 His memory loss is in the realm of mild cognitive impairment at this time. I think his presentation is rather mild with respect to the parkinsonian signs. At this juncture I would like to continue to watch this and follow him clinically for this. He sleeps about 8-9 hours per night and also some during the day. I would like to stay away from using clonazepam for his RBD for fear of exacerbating the daytime somnolence. He is advised to continue to pursue exercise regularly and he likes to do water aerobics. I did not suggest  any new medications today as his parkinsonism is mild and so is his memory loss. In the past Dr. Sandria Manly had also reassured him that he does not have Parkinson's but at this juncture I think we have to entertain the possibility that he has developed some parkinsonism. I would like to see him back  in February of next year which was previously scheduled and in the interim we will try some speech therapy for his hypophonia.

## 2013-10-22 NOTE — Patient Instructions (Addendum)
I think overall you are doing fairly well but I do want to suggest a few things today:  Remember to drink plenty of fluid, eat healthy meals and do not skip any meals. Try to eat protein with a every meal and eat a healthy snack such as fruit or nuts in between meals. Try to keep a regular sleep-wake schedule and try to exercise daily, particularly in the form of walking, 20-30 minutes a day, if you can. Use your cane at all times.   Engage in social activities in your community and with your family and try to keep up with current events by reading the newspaper or watching the news.   As far as your medications are concerned, I would like to suggest no changes.    As far as diagnostic testing: no new test. We will try speech therapy.  I would like to see you back in 4 months, sooner if we need to. Please call us with any interim questions, concerns, problems, updates or refill requests.  Brett Canales is my clinical assistant and will answer any of your questions and relay your messages to me and also relay most of my messages to you.  Our phone number is [phone]. We also have an after hours call service for urgent matters and there is a physician on-call for urgent questions. For any emergencies you know to call 911 or go to the nearest emergency room.

## 2013-11-11 ENCOUNTER — Other Ambulatory Visit: Payer: Self-pay | Admitting: Internal Medicine

## 2013-11-11 ENCOUNTER — Ambulatory Visit (INDEPENDENT_AMBULATORY_CARE_PROVIDER_SITE_OTHER): Payer: Medicare Other | Admitting: *Deleted

## 2013-11-11 DIAGNOSIS — I442 Atrioventricular block, complete: Secondary | ICD-10-CM

## 2013-11-17 LAB — MDC_IDC_ENUM_SESS_TYPE_REMOTE
Battery Remaining Longevity: 114 mo
Brady Statistic AS VS Percent: 0 %
Date Time Interrogation Session: 20141201190559
Lead Channel Impedance Value: 657 Ohm
Lead Channel Pacing Threshold Amplitude: 0.5 V
Lead Channel Pacing Threshold Pulse Width: 0.4 ms
Lead Channel Sensing Intrinsic Amplitude: 2.8 mV

## 2013-11-19 LAB — MDC_IDC_ENUM_SESS_TYPE_REMOTE
Battery Impedance: 199 Ohm
Battery Remaining Longevity: 115 mo
Brady Statistic AP VP Percent: 39 %
Brady Statistic AS VS Percent: 0 %
Date Time Interrogation Session: 20141201194135
Lead Channel Impedance Value: 673 Ohm

## 2013-12-11 ENCOUNTER — Encounter: Payer: Self-pay | Admitting: *Deleted

## 2013-12-27 ENCOUNTER — Encounter: Payer: Self-pay | Admitting: Internal Medicine

## 2014-01-02 ENCOUNTER — Telehealth: Payer: Self-pay | Admitting: *Deleted

## 2014-01-02 DIAGNOSIS — G20 Parkinson's disease: Secondary | ICD-10-CM

## 2014-01-02 NOTE — Telephone Encounter (Signed)
Spoke with patient and he would like speech therapy sent to [facility]

## 2014-01-09 DIAGNOSIS — M25569 Pain in unspecified knee: Secondary | ICD-10-CM | POA: Insufficient documentation

## 2014-01-10 ENCOUNTER — Other Ambulatory Visit: Payer: Self-pay | Admitting: Cardiovascular Disease

## 2014-01-10 ENCOUNTER — Other Ambulatory Visit: Payer: Self-pay | Admitting: *Deleted

## 2014-01-10 MED ORDER — CLONIDINE HCL 0.1 MG PO TABS: 0.1000 mg | ORAL_TABLET | Freq: Two times a day (BID) | ORAL | Status: DC

## 2014-01-10 NOTE — Telephone Encounter (Signed)
Requested Prescriptions   Signed Prescriptions Disp Refills  . cloNIDine (CATAPRES) 0.1 MG tablet 60 tablet 3    Sig: Take 1 tablet (0.1 mg total) by mouth 2 (two) times daily.    Authorizing Provider: GOLLAN, TIMOTHY J    Ordering User: LOPEZ, MARINA C    

## 2014-01-13 NOTE — Telephone Encounter (Signed)
Redid order for the LOUD, LSDT at [facility], (needed per [facility] rerquest).

## 2014-02-05 ENCOUNTER — Encounter: Payer: Self-pay | Admitting: Neurology

## 2014-02-05 ENCOUNTER — Ambulatory Visit (INDEPENDENT_AMBULATORY_CARE_PROVIDER_SITE_OTHER): Payer: Medicare Other | Admitting: Neurology

## 2014-02-05 VITALS — BP 140/83 | HR 80 | Temp 98.0°F | Ht 70.0 in | Wt 204.0 lb

## 2014-02-05 DIAGNOSIS — G20 Parkinson's disease: Secondary | ICD-10-CM

## 2014-02-05 DIAGNOSIS — G4752 REM sleep behavior disorder: Secondary | ICD-10-CM

## 2014-02-05 DIAGNOSIS — G8929 Other chronic pain: Secondary | ICD-10-CM

## 2014-02-05 DIAGNOSIS — R351 Nocturia: Secondary | ICD-10-CM

## 2014-02-05 DIAGNOSIS — G3184 Mild cognitive impairment, so stated: Secondary | ICD-10-CM

## 2014-02-05 DIAGNOSIS — G609 Hereditary and idiopathic neuropathy, unspecified: Secondary | ICD-10-CM

## 2014-02-05 NOTE — Patient Instructions (Addendum)
We will try speech therapy.  We will continue to follow clinically. Use your cane for safety, especially when outside.  We can consider a referral to urology down the road.  Increase your water intake to about 6 glasses of water.

## 2014-02-05 NOTE — Progress Notes (Signed)
Subjective:    Patient ID: Randy [name] is a 80 y.o. male.  HPI    Interim history:   Randy [name] is a very pleasant 80 year old right-handed gentleman who presents for followup consultation of his lower extremity weakness, neuropathy and parkinsonism. He also has a history of spinal stenosis and was not deemed a surgical candidate. He has significant issues with arthritis in multiple joints and has had multiple surgeries. He is accompanied by his wife again today. I last saw him on 10/22/2013 for a sooner than scheduled appointment due to worsening memory and worsening tremors and weaker speech, at which time I felt he had a history consistent with RBD, I noted mild memory impairment in the realm of mild cognitive impairment and mild parkinsonism for which I did not initiate any medication. i referred him to ST. He did not get scheduled until coming up in March.   Today, he reports that he is on Pristiq Brand name. He has never been on Parkinson's medication. He has chronic pain. He is going to see Dr. Edger House at the [place] [facility] in March. He feels essentially unchanged from 11/14. He sleeps a lot, takes a morning nap and an afternoon nap, 1-2 hours long each. He does not drink enough water, averaging maybe 3 8 oz glasses a day. He has nocturia 3 times per night. He has had some incontinence during the day as well and has started wearing a pad.     I saw him on 08/05/2013, at which time I did not make any medication changes. I did note some mild left-sided parkinsonism. He was in the process of closing up a pond and while working on this, he slid into the hole and bruised his L shoulder and the L wrist. He was seen by his orthopedic surgeon for his L hip, but was advised against surgery. He has had dream enactments for the past 4 years. It happens about 1/week.  I first met him on 02/05/2013 and he previously followed with Dr. Morene Antu. He has a history of bilateral lower  extremity weakness. He is status post multiple knee surgeries in 1974, 1979, 1996, and status post knee replacement surgeries in 1997, status post right total hip replacement surgery in 2009, who has had over the past several years weakness and soreness in his proximal lower extremities, going on for at least 5 years. His weakness has not been very progressive. MRI lumbar spine in March 2009 showed bilateral neural foraminal narrowing at L5-S1 and possible nerve root compromise on the left at L4-5. EMG and nerve conduction studies on 11/09/2010 was unremarkable except for slightly prolonged at wave latencies. There was no evidence of myopathy, he had workup including serum electrophoresis, TSH, ESR, CK, B12 all normal in November 2011. 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 Erling Cruz in July 2013 at which time Dr. Erling Cruz suggested a one year followup because the patient was deemed stable. The patient presented for a sooner than scheduled followup on 02/05/2013 as he had some medication changes. He had stopped Pristiq for the second time but after 3 months realize that his anxiety and depression flared up and restarted it in January. He has had a change in his blood pressure medication and reported that he may need shoulder surgery. He has had tremors which have been stable.  He is no longer taking ASA at this time and takes the HCTZ 12.5 mg daily. He  noted excess bruising with the ASA. He has a tendency to act out in his dreams and this happens about 2/week. He has not fallen recently, but did fall out of bed 4-5 weeks ago.  He has noted more fine motor difficulties, especially on the L, with buttoning and with getting his wallet out of the L back pocket of his pants. He has had some progression of his hand tremors. His neuropathy is about the same he feels. His weakness and thigh muscle wasting is stable as well.   His Past Medical History Is Significant For: Past Medical  History  Diagnosis Date  . Hypertension   . Depression   . Anxiety   . Tremor   . Memory deficit 03/24/2011  . Pacemaker   . Shortness of breath   . Complete heart block   . Blood transfusion     hx of autologus transfusion  . Neuromuscular disorder     peripheral  neuropathy  . Arthritis   . Parkinson's disease   . Parkinsonism 10/22/2013  . MCI (mild cognitive impairment) 10/22/2013    His Past Surgical History Is Significant For: Past Surgical History  Procedure Laterality Date  . Knee surgery  [phone]  . Replacement total knee bilateral  1997  . Umbilical hernia repair  2001  . Cataract extraction  06/2005  . Rotator cuff repair  06/2008  . Total hip arthroplasty  2009    right  . Defibrilator  04/04/2012    Dual Chamber  . Insert / replace / remove pacemaker  04/04/2012    His Family History Is Significant For: Family History  Problem Relation Age of Onset  . Hypertension Mother   . Hypertension Father   . Seizures Father     epilepsy  . Prostate cancer Other   . Cancer Other   . Hypertension Other   . Asthma Other     His Social History Is Significant For: History   Social History  . Marital Status: Married    Spouse Name: Polly    Number of Children: 3  . Years of Education: College   Occupational History  . Retired    Social History Main Topics  . Smoking status: Former Smoker -- 1.00 packs/day for 18 years    Types: Cigarettes    Quit date: 03/20/1973  . Smokeless tobacco: Never Used  . Alcohol Use: Yes     Comment: occasional  . Drug Use: No  . Sexual Activity: Not Currently   Other Topics Concern  . None   Social History Narrative   Patient lives at home with spouse.   Caffeine Use: 2 cups of coffee daily    His Allergies Are:  Allergies  Allergen Reactions  . Codeine Nausea And Vomiting  . Lodine [Etodolac] Rash  . Percocet [Oxycodone-Acetaminophen] Rash    Tolerates tylenol  :   His Current Medications Are:   Outpatient Encounter Prescriptions as of 02/05/2014  Medication Sig  . amoxicillin (AMOXIL) 500 MG capsule   . cloNIDine (CATAPRES) 0.1 MG tablet Take 1 tablet (0.1 mg total) by mouth 2 (two) times daily.  Marland Kitchen desvenlafaxine (PRISTIQ) 50 MG 24 hr tablet Take 50 mg by mouth daily.    . hydrochlorothiazide (HYDRODIURIL) 25 MG tablet Take 12.5 mg by mouth daily.   Marland Kitchen lisinopril (PRINIVIL,ZESTRIL) 40 MG tablet Take 1 tablet (40 mg total) by mouth daily.  . [DISCONTINUED] cloNIDine (CATAPRES) 0.1 MG tablet TAKE ONE TABLET BY MOUTH TWICE DAILY  :  Review of Systems:  Out of a complete 14 point review of systems, all are reviewed and negative with the exception of these symptoms as listed below:   Review of Systems  HENT: Negative.   Eyes: Negative.   Respiratory: Negative.   Cardiovascular: Negative.   Gastrointestinal: Negative.   Endocrine: Negative.   Genitourinary: Positive for frequency and enuresis.  Musculoskeletal: Negative.   Skin: Negative.   Allergic/Immunologic: Negative.   Neurological: Positive for tremors and weakness.  Hematological: Negative.   Psychiatric/Behavioral: Positive for sleep disturbance (snoring, sleep talking, acting-out dreams).    Objective:  Neurologic Exam  Physical Exam Physical Examination:   Filed Vitals:   02/05/14 1123  BP: 140/83  Pulse: 80  Temp: 98 F (36.7 C)   General Examination: The patient is a very pleasant 80 y.o. male in no acute distress. He appears well-developed and well-nourished and very well groomed.   HEENT exam: Normocephalic, atraumatic, pupils are equal, round and reactive to light and accommodation. Extraocular tracking is shows mild saccadic breakdown without nystagmus. Hearing is mildly impaired. Neck is mildly rigid with full range of motion. He has no lip, neck or jaw tremor. Oropharynx examination reveals a mild to moderately tight airway primarily secondary to enlarged uvula which also is elongated and reaches far  back down. Face is symmetric with perhaps mild facial masking. Palate elevates symmetrically and tongue protrudes centrally. Speech is mildly hypophonic. He has a slight degree of raspiness in his voice. He has no dysarthria. He has a Mallampati class II.  Chest is clear to auscultation, heart sounds are normal without murmurs, rubs or gallops, abdomen is soft, with normal bowel sounds.  He has trace pitting edema around both ankles.  Skin is warm and dry and he has some discoloration in the distal lower extremities.  Musculoskeletal: he has pain in the R SI joint, in the the groins bilateral and L knee. He has bilateral knee surgery scars.  Neurologically: Mental status: The patient is awake, alert and oriented in all 4 spheres. He is able to give a good history. His memory, attention, language and knowledge are mildly impaired. He is oriented to time, place, self, circumstance, season, reason for visit.  Cranial nerves II-XII are as described under HEENT exam. Motor exam: He does have a mildly decreased bulk in both thighs. Otherwise bulk, tone and strength is normal with the exception of mild hip flexor weakness bilaterally. He also reports left hip pain. Reflexes are 1+ in the upper extremities, trace in both knees and absent in both ankles. Cerebellar testing shows no dysmetria or intention tremor. He has no resting tremor. He does have a mild degree of postural tremor in the left upper extremity and a minimal degree of postural tremor in the right upper extremity, unchanged. The same is true for action tremor which is mild on the left and minimal on the right. Finger taps are mildly impaired bilaterally, more on the L. Hand movements and rapid alternating patting is appropriate, slightly worse on the L. Foot agility is mildly impaired on the L. Romberg testing shows mild swaying but no corrective steps. Sensory exam is decreased for vibration sense, temperature, pinprick and light touch in the distal  lower extremities bilaterally in the distal lower one third, unchanged.   Assessment and Plan:    In summary, Randy [name] is a very pleasant 80 year old gentleman with a history of gait disorder, weakness, neuropathy, in the context of an underlying history of significant osteoarthritis affecting  multiple joints, spinal stenosis and s/p multiple surgeries. 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 His memory loss is in the realm of mild cognitive impairment. Overall, his presentation is rather mild with respect to parkinsonism. His biggest complaints are pain related, in particular pain in the L hip, R SI joint area, the parkinsonian signs. At this juncture I would like to continue to watch this and follow him clinically for this. He sleeps about 8-9 hours per night and also some during the day. I would like to stay away from using clonazepam for his RBD for fear of exacerbating the daytime somnolence. He is advised to continue to pursue exercise regularly within his limitations and ask Dr. Oleta Mouse about possible PT. He likes to do water aerobics. I did not suggest any new medications today as his parkinsonism is mild and so is his memory loss. In the past Dr. Erling Cruz had also reassured him that he does not have PD, but at this juncture I think we have to entertain the possibility that he has some parkinsonism. I would like to see him back in 3 months and he is to start some speech therapy for his hypophonia. We will consider a referral to Urology in the near future; he would like to hold off on this right now, as he is seeing a lot of doctors already. He is advised to increase his water intake.

## 2014-02-11 DIAGNOSIS — M5416 Radiculopathy, lumbar region: Secondary | ICD-10-CM

## 2014-02-11 DIAGNOSIS — M48061 Spinal stenosis, lumbar region without neurogenic claudication: Secondary | ICD-10-CM | POA: Insufficient documentation

## 2014-02-12 ENCOUNTER — Ambulatory Visit (INDEPENDENT_AMBULATORY_CARE_PROVIDER_SITE_OTHER): Payer: Medicare Other | Admitting: *Deleted

## 2014-02-12 DIAGNOSIS — I442 Atrioventricular block, complete: Secondary | ICD-10-CM

## 2014-02-20 DIAGNOSIS — M5416 Radiculopathy, lumbar region: Secondary | ICD-10-CM | POA: Insufficient documentation

## 2014-03-12 ENCOUNTER — Encounter: Payer: Self-pay | Admitting: *Deleted

## 2014-03-21 ENCOUNTER — Encounter: Payer: Self-pay | Admitting: Internal Medicine

## 2014-04-09 ENCOUNTER — Encounter: Payer: Self-pay | Admitting: Cardiovascular Disease

## 2014-04-09 ENCOUNTER — Ambulatory Visit (INDEPENDENT_AMBULATORY_CARE_PROVIDER_SITE_OTHER): Payer: Medicare Other | Admitting: Cardiovascular Disease

## 2014-04-09 VITALS — BP 122/76 | HR 83 | Ht 71.0 in | Wt 199.5 lb

## 2014-04-09 DIAGNOSIS — G20 Parkinson's disease: Secondary | ICD-10-CM

## 2014-04-09 DIAGNOSIS — I442 Atrioventricular block, complete: Secondary | ICD-10-CM

## 2014-04-09 DIAGNOSIS — I10 Essential (primary) hypertension: Secondary | ICD-10-CM

## 2014-04-09 NOTE — Progress Notes (Signed)
Patient ID: Randy [name], male    DOB: Mar 20, 1934, 80 y.o.   MRN: [id number]  HPI Comments: Mr. Randy [name] is a very pleasant 80 year old gentleman with a long history of hypertension, possible depression and anxiety, tremor, difficulty tolerating various blood pressure medications secondary to side effects, Patient of Dr. Burnett ShengHedrick, who presents for routine followup.   Previously seen by neurology. Mild dementia, motor deficits with balance issues. He has severe left hip pain. He is doing aquatic therapy, voice therapy. Also has spinal stenosis.  Legs are weak, needs assistance getting out of a chair.  Reports that he is scheduled to work with physical therapy Rare fall, known recently  Previously seen by Dr. Graciela HusbandsKlein  for pacemaker followup given complete heart block April 2013. Reports blood pressure has been well controlled at home  He is having problems with nocturia, occasional accidents.  total cholesterol 170, LDL 94   EKG shows paced rhythm with rate 83 beats per minute  Outpatient Encounter Prescriptions as of 04/09/2014  Medication Sig  . acetaminophen-codeine (TYLENOL #3) 300-30 MG per tablet Take 2 tablets by mouth as needed.   Marland Kitchen. amoxicillin (AMOXIL) 500 MG capsule Take 500 mg by mouth as needed.   . cloNIDine (CATAPRES) 0.1 MG tablet Take 1 tablet (0.1 mg total) by mouth 2 (two) times daily.  Marland Kitchen. desvenlafaxine (PRISTIQ) 50 MG 24 hr tablet Take 50 mg by mouth daily.    . hydrochlorothiazide (HYDRODIURIL) 25 MG tablet Take 12.5 mg by mouth as needed.   Marland Kitchen. lisinopril (PRINIVIL,ZESTRIL) 40 MG tablet Take 1 tablet (40 mg total) by mouth daily.    Review of Systems  HENT: Negative.   Eyes: Negative.   Respiratory: Negative.   Cardiovascular: Negative.   Gastrointestinal: Negative.   Endocrine: Negative.   Genitourinary:       Nocturia  Musculoskeletal: Positive for gait problem.  Skin: Negative.   Allergic/Immunologic: Negative.   Neurological: Positive for weakness.   Hematological: Negative.   Psychiatric/Behavioral: Positive for sleep disturbance and dysphoric mood.       Memory problems  All other systems reviewed and are negative.   BP 122/76  Pulse 83  Ht 5\' 11"  (1.803 m)  Wt 199 lb 8 oz (90.493 kg)  BMI 27.84 kg/m2  Physical Exam  Nursing note and vitals reviewed. Constitutional: He is oriented to person, place, and time. He appears well-developed and well-nourished.  HENT:  Head: Normocephalic.  Nose: Nose normal.  Mouth/Throat: Oropharynx is clear and moist.  Eyes: Conjunctivae are normal. Pupils are equal, round, and reactive to light.  Neck: Normal range of motion. Neck supple. No JVD present.  Cardiovascular: Normal rate, regular rhythm, S1 normal, S2 normal, normal heart sounds and intact distal pulses.  Exam reveals no gallop and no friction rub.   No murmur heard. Pulmonary/Chest: Effort normal and breath sounds normal. No respiratory distress. He has no wheezes. He has no rales. He exhibits no tenderness.  Abdominal: Soft. Bowel sounds are normal. He exhibits no distension. There is no tenderness.  Musculoskeletal: Normal range of motion. He exhibits no edema and no tenderness.  Lymphadenopathy:    He has no cervical adenopathy.  Neurological: He is alert and oriented to person, place, and time. Coordination normal.  Skin: Skin is warm and dry. No rash noted. No erythema.  Psychiatric: He has a normal mood and affect. His behavior is normal. Judgment and thought content normal.      Assessment and Plan  A 

## 2014-04-09 NOTE — Assessment & Plan Note (Deleted)
[facility] [place] [facility] [facility]

## 2014-04-09 NOTE — Assessment & Plan Note (Signed)
Pacemaker in place

## 2014-04-09 NOTE — Assessment & Plan Note (Signed)
Blood pressure is well controlled on today's visit. No changes made to the medications. 

## 2014-04-09 NOTE — Assessment & Plan Note (Signed)
Previously evaluated by neurology in [place]. Appears to relatively stable per the wife

## 2014-04-09 NOTE — Patient Instructions (Signed)
You are doing well. No medication changes were made.  Please call us if you have new issues that need to be addressed before your next appt.  Your physician wants you to follow-up in: 6 months.  You will receive a reminder letter in the mail two months in advance. If you don't receive a letter, please call our office to schedule the follow-up appointment.   

## 2014-04-16 ENCOUNTER — Other Ambulatory Visit: Payer: Self-pay | Admitting: Cardiovascular Disease

## 2014-04-25 ENCOUNTER — Ambulatory Visit (INDEPENDENT_AMBULATORY_CARE_PROVIDER_SITE_OTHER): Payer: Medicare Other | Admitting: Neurology

## 2014-04-25 ENCOUNTER — Encounter: Payer: Self-pay | Admitting: Neurology

## 2014-04-25 VITALS — BP 149/92 | HR 95 | Temp 97.1°F | Ht 71.0 in | Wt 197.0 lb

## 2014-04-25 DIAGNOSIS — G3184 Mild cognitive impairment, so stated: Secondary | ICD-10-CM

## 2014-04-25 DIAGNOSIS — Z96649 Presence of unspecified artificial hip joint: Secondary | ICD-10-CM

## 2014-04-25 DIAGNOSIS — M48061 Spinal stenosis, lumbar region without neurogenic claudication: Secondary | ICD-10-CM

## 2014-04-25 DIAGNOSIS — M25559 Pain in unspecified hip: Secondary | ICD-10-CM

## 2014-04-25 DIAGNOSIS — M25552 Pain in left hip: Secondary | ICD-10-CM

## 2014-04-25 DIAGNOSIS — Z96659 Presence of unspecified artificial knee joint: Secondary | ICD-10-CM

## 2014-04-25 DIAGNOSIS — G20 Parkinson's disease: Secondary | ICD-10-CM

## 2014-04-25 DIAGNOSIS — G4752 REM sleep behavior disorder: Secondary | ICD-10-CM

## 2014-04-25 DIAGNOSIS — R269 Unspecified abnormalities of gait and mobility: Secondary | ICD-10-CM

## 2014-04-25 DIAGNOSIS — Z96641 Presence of right artificial hip joint: Secondary | ICD-10-CM

## 2014-04-25 DIAGNOSIS — G609 Hereditary and idiopathic neuropathy, unspecified: Secondary | ICD-10-CM

## 2014-04-25 NOTE — Progress Notes (Signed)
Subjective:    Patient ID: Randy [name] is a 80 y.o. male.  HPI    Interim history:   Randy [name] is a 80 year old right-handed gentleman who presents for followup consultation of his lower extremity weakness, neuropathy and parkinsonism. Randy [name] also has a history of spinal stenosis and was not deemed a surgical candidate. Randy [name] has significant issues with arthritis in multiple joints and has had multiple surgeries. Randy [name] is accompanied by his wife and his daughter today. I last saw Randy [name] on 02/05/2014, at which time I felt Randy [name] had mild parkinsonism, history of mild RBD, and gait disorder in the context of neuropathy and osteoarthritis affecting multiple joints, spinal stenosis and status post multiple surgeries. I encouraged Randy [name] to increase his water intake and did not suggest any new medications. In the past, Dr. Erling Cruz felt Randy [name] did not have Parkinson's disease. Randy [name] was supposed to see Dr. Edger House at the [place] [facility] in March 2015. Randy [name] reported being on Pristiq. We talked about a referral to urology for his nocturia and his urinary incontinence but Randy [name] requested to hold off.  Today, his wife reports, Randy [name] saw the spine specialist and had epidural injection, which helped some. Randy [name] had a pneumonia shot and developed chills. ST helped, Randy [name] feels. Randy [name] has an appointment with his back doctor next month. Randy [name] finished aqua therapy which helped. Randy [name] has an appointment with a urologist soon.  I saw Randy [name] on 10/22/2013 for a sooner than scheduled appointment due to worsening memory and worsening tremors and weaker speech, at which time I felt Randy [name] had a history consistent with RBD, I noted mild memory impairment in the realm of mild cognitive impairment and mild parkinsonism for which I did not initiate any medication. I referred Randy [name] to [place]. Randy [name] did not get scheduled until March.   I saw Randy [name] on 08/05/2013, at which time I did not make any medication changes. I did note some mild left-sided parkinsonism. Randy [name] was in the  process of closing up a pond and while working on this, Randy [name] slid into the hole and bruised his L shoulder and the L wrist. Randy [name] was seen by his orthopedic surgeon for his L hip, but was advised against surgery. Randy [name] has had dream enactments for the past 4 years. It happens about 1/week.   I first met Randy [name] on 02/05/2013 and Randy [name] previously followed with Dr. Morene Antu. Randy [name] has a history of bilateral lower extremity weakness. Randy [name] is status post multiple knee surgeries in 1974, 1979, 1996, and status post knee replacement surgeries in 1997, status post right total hip replacement surgery in 2009, who has had over the past several years weakness and soreness in his proximal lower extremities, going on for at least 5 years. His weakness has not been very progressive. MRI lumbar spine in March 2009 showed bilateral neural foraminal narrowing at L5-S1 and possible nerve root compromise on the left at L4-5. EMG and nerve conduction studies on 11/09/2010 was unremarkable except for slightly prolonged at wave latencies. There was no evidence of myopathy, Randy [name] had workup including serum electrophoresis, TSH, ESR, CK, B12 all normal in November 2011. Randy [name] had pacemaker placed because of bradycardia and April 2013 and last saw Dr. Erling Cruz in July 2013 at which time Dr. Erling Cruz suggested a one year followup because the patient was deemed stable. The patient presented for a sooner than scheduled followup on 02/05/2013 as Randy [name] had some medication changes. Randy [name] had stopped Pristiq for the second time but after  3 months realize that his anxiety and depression flared up and restarted it in January. Randy [name] has had a change in his blood pressure medication and reported that Randy [name] may need shoulder surgery. Randy [name] has had tremors which have been stable.   His Past Medical History Is Significant For: Past Medical History  Diagnosis Date  . Hypertension   . Depression   . Anxiety   . Tremor   . Memory deficit 03/24/2011  . Pacemaker   . Shortness of breath   .  Complete heart block   . Blood transfusion     hx of autologus transfusion  . Neuromuscular disorder     peripheral  neuropathy  . Arthritis   . Parkinson's disease   . Parkinsonism 10/22/2013  . MCI (mild cognitive impairment) 10/22/2013    His Past Surgical History Is Significant For: Past Surgical History  Procedure Laterality Date  . Knee surgery  [id number]  . Replacement total knee bilateral  1997  . Umbilical hernia repair  2001  . Cataract extraction  06/2005  . Rotator cuff repair  06/2008  . Total hip arthroplasty  2009    right  . Defibrilator  04/04/2012    Dual Chamber  . Insert / replace / remove pacemaker  04/04/2012    His Family History Is Significant For: Family History  Problem Relation Age of Onset  . Hypertension Mother   . Hypertension Father   . Seizures Father     epilepsy  . Prostate cancer Other   . Cancer Other   . Hypertension Other   . Asthma Other     His Social History Is Significant For: History   Social History  . Marital Status: Married    Spouse Name: Polly    Number of Children: 3  . Years of Education: College   Occupational History  . Retired    Social History Main Topics  . Smoking status: Former Smoker -- 1.00 packs/day for 18 years    Types: Cigarettes    Quit date: 03/20/1973  . Smokeless tobacco: Never Used  . Alcohol Use: Yes     Comment: occasional  . Drug Use: No  . Sexual Activity: Not Currently   Other Topics Concern  . None   Social History Narrative   Patient lives at home with spouse.   Caffeine Use: 2 cups of coffee daily    His Allergies Are:  Allergies  Allergen Reactions  . Codeine Nausea And Vomiting  . Lodine [Etodolac] Rash  . Percocet [Oxycodone-Acetaminophen] Rash    Tolerates tylenol  :   His Current Medications Are:  Outpatient Encounter Prescriptions as of 04/25/2014  Medication Sig  . acetaminophen-codeine (TYLENOL #3) 300-30 MG per tablet Take 2 tablets by mouth as needed.    Marland Kitchen amoxicillin (AMOXIL) 500 MG capsule Take 500 mg by mouth as needed.   . cloNIDine (CATAPRES) 0.1 MG tablet Take 1 tablet (0.1 mg total) by mouth 2 (two) times daily.  Marland Kitchen desvenlafaxine (PRISTIQ) 50 MG 24 hr tablet Take 50 mg by mouth daily.    . hydrochlorothiazide (HYDRODIURIL) 25 MG tablet Take 12.5 mg by mouth as needed.   Marland Kitchen lisinopril (PRINIVIL,ZESTRIL) 40 MG tablet TAKE ONE TABLET BY MOUTH EVERY DAY  :  Review of Systems:  Out of a complete 14 point review of systems, all are reviewed and negative with the exception of these symptoms as listed below:  Review of Systems  Musculoskeletal: Positive for back pain, gait problem, joint  swelling, neck pain and neck stiffness.       Joint pain,aching muscles  Neurological: Positive for weakness and numbness.       Snoring,daytime sleepiness,frequent waking,memory loss    Objective:  Neurologic Exam  Physical Exam Physical Examination:   Filed Vitals:   04/25/14 1214  BP: 149/92  Pulse: 95  Temp: 97.1 F (36.2 C)    General Examination: The patient is a very pleasant 80 y.o. male in no acute distress. Randy [name] appears well-developed and well-nourished and very well groomed.   HEENT exam: Normocephalic, atraumatic, pupils are equal, round and reactive to light and accommodation. Extraocular tracking is shows mild saccadic breakdown without nystagmus. Hearing is mildly impaired. Neck is mildly rigid with full range of motion. Randy [name] has no lip, neck or jaw tremor. Oropharynx examination reveals a mild to moderately tight airway primarily secondary to enlarged uvula which also is elongated and reaches far back down. Face is symmetric with perhaps mild facial masking. Palate elevates symmetrically and tongue protrudes centrally. Speech is mildly hypophonic. Randy [name] has a slight degree of raspiness in his voice. Randy [name] has no dysarthria. Randy [name] has a Mallampati class II.   Chest is clear to auscultation, heart sounds are normal without murmurs, rubs or gallops,  abdomen is soft, with normal bowel sounds.   Randy [name] has no pitting edema around both ankles.   Skin is warm and dry and Randy [name] has some discoloration in the distal lower extremities.   Musculoskeletal: Randy [name] has pain in the R SI joint, in the the groins bilateral and L knee and his L hip. Randy [name] has bilateral knee surgery scars.  Neurologically: Mental status: The patient is awake, alert and oriented in all 4 spheres. Randy [name] is able to give a good history. His memory, attention, language and knowledge are mildly impaired. Randy [name] is oriented to time, place, self, circumstance, season, reason for visit.  Cranial nerves II-XII are as described under HEENT exam. Motor exam: Randy [name] does have a mildly decreased bulk in both thighs. Otherwise bulk, tone and strength is normal with the exception of mild hip flexor weakness bilaterally, L more than R. Randy [name] also reports left hip pain. Reflexes are 1+ in the upper extremities, trace in both knees and absent in both ankles. Cerebellar testing shows no dysmetria or intention tremor. Randy [name] has no resting tremor. Randy [name] does have a mild degree of postural tremor in the left upper extremity and a minimal degree of postural tremor in the right upper extremity, unchanged. The same is true for action tremor which is mild on the left and minimal on the right. Finger taps are mildly impaired bilaterally and rapid alternating patting is fairly appropriate. Foot agility is mildly impaired on the L. Romberg testing shows mild swaying but no corrective steps. Sensory exam is decreased for vibration sense, temperature, pinprick and light touch in the distal lower extremities bilaterally in the distal lower one third, unchanged. Randy [name] walks better with his cane. Randy [name] walks insecurely without his cane. Randy [name] turns in 3 steps, and slowly. His left leg is slightly turned out and his left knee is not completely stretched out when walking.  Assessment and Plan:    In summary, Mr. Fitzwater is a very pleasant 80 year old gentleman  with a history of gait disorder, weakness, neuropathy, in the context of an underlying history of significant osteoarthritis affecting multiple joints, lumbar spinal stenosis and s/p multiple surgeries, particularly joint related surgeries and joint replacement surgeries and. Randy [name] has some changes in his fine motor  function and has mild memory loss and mild dream enactments, most likely in keeping with mild RBD. His memory loss is in the realm of mild cognitive impairment. Overall, his presentation is rather mild with respect to parkinsonism. His biggest complaints continue to be arthritic and pain related, in particular pain in the L hip, and low back pain. Randy [name] had one epidural injection which helped to some degree but Randy [name] felt Randy [name] had a big setback after Randy [name] had a pneumonia shot. Randy [name] has an appointment with his back doctor next month and will likely discuss another epidural injection. Randy [name] has been discouraged by his orthopedic surgeon to undergo a left hip surgery but tells me that Randy [name] really needs a replacement surgery but Randy [name]'s not a surgical candidate. I do not think it is worth our while to try Randy [name] on Sinemet or any other Parkinson's medication. I think Randy [name] is at risk for side effects and Randy [name] understands that taking symptomatic medication for parkinsonism as not going to alter the course of his parkinsonism. At this juncture, I have asked Randy [name] to continue to be well hydrated. I've asked Randy [name] to continue exercising and moderation and particularly try to do water exercises if it helps Randy [name] to take the weight off of his joints. Furthermore, Randy [name] is encouraged to keep his followup appointment with his back doctor and also discuss any other treatment options with his orthopedic doctor regarding his left hip. Of note, Randy [name] has an appointment with urology coming up next month as well which we had talked about last time. I will see Randy [name] back routinely in 3-4 months. Randy [name] is advised to use his cane regularly but I think she will do better  with a rolling walker as Randy [name] has a tendency to favor his right leg and sometimes his left knee or left hip tries to give out. Randy [name] is requesting a rolling walker with seat and I will be happy to provide that. Randy [name] and his family were in agreement.

## 2014-04-25 NOTE — Patient Instructions (Addendum)
I will prescribe a rolling walker.  Follow up in 4 months.  Maybe you will do better with another epidural injection.

## 2014-05-08 ENCOUNTER — Ambulatory Visit (INDEPENDENT_AMBULATORY_CARE_PROVIDER_SITE_OTHER): Payer: Medicare Other | Admitting: Internal Medicine

## 2014-05-08 ENCOUNTER — Encounter: Payer: Self-pay | Admitting: Internal Medicine

## 2014-05-08 VITALS — BP 110/80 | HR 72 | Ht 71.0 in | Wt 199.5 lb

## 2014-05-08 DIAGNOSIS — I442 Atrioventricular block, complete: Secondary | ICD-10-CM

## 2014-05-08 DIAGNOSIS — I359 Nonrheumatic aortic valve disorder, unspecified: Secondary | ICD-10-CM

## 2014-05-08 DIAGNOSIS — I350 Nonrheumatic aortic (valve) stenosis: Secondary | ICD-10-CM

## 2014-05-08 LAB — MDC_IDC_ENUM_SESS_TYPE_INCLINIC
Battery Impedance: 224 Ohm
Battery Remaining Longevity: 110 mo
Battery Voltage: 2.79 V
Brady Statistic AP VP Percent: 34 %
Brady Statistic AP VS Percent: 0 %
Brady Statistic AS VP Percent: 66 %
Date Time Interrogation Session: 20150528112604
Lead Channel Impedance Value: 543 Ohm
Lead Channel Impedance Value: 608 Ohm
Lead Channel Pacing Threshold Amplitude: 0.5 V
Lead Channel Pacing Threshold Amplitude: 0.5 V
Lead Channel Pacing Threshold Pulse Width: 0.4 ms
Lead Channel Sensing Intrinsic Amplitude: 15.67 mV
Lead Channel Setting Pacing Amplitude: 2 V
MDC IDC MSMT LEADCHNL RA SENSING INTR AMPL: 4 mV
MDC IDC MSMT LEADCHNL RV PACING THRESHOLD PULSEWIDTH: 0.4 ms
MDC IDC SET LEADCHNL RA PACING AMPLITUDE: 1.5 V
MDC IDC SET LEADCHNL RV PACING PULSEWIDTH: 0.4 ms
MDC IDC SET LEADCHNL RV SENSING SENSITIVITY: 4 mV
MDC IDC STAT BRADY AS VS PERCENT: 0 %

## 2014-05-08 NOTE — Progress Notes (Signed)
      Patient Care Team: [name], MD as PCP - General (Family Medicine)   HPI  Randy [name] is a 80 y.o. male seen for pacemaker followup for complete heart block implanted 4/13 and complicated by micropeforation requiring lead reposition \  ddoing well except taking more NSAIDs or arthritis  No chest pain or sob or edema   Past Medical History  Diagnosis Date  . Hypertension   . Depression   . Anxiety   . Tremor   . Memory deficit 03/24/2011  . Pacemaker   . Shortness of breath   . Complete heart block   . Blood transfusion     hx of autologus transfusion  . Neuromuscular disorder     peripheral  neuropathy  . Arthritis   . Parkinson's disease   . Parkinsonism 10/22/2013  . MCI (mild cognitive impairment) 10/22/2013    Past Surgical History  Procedure Laterality Date  . Knee surgery  [phone]  . Replacement total knee bilateral  1997  . Umbilical hernia repair  2001  . Cataract extraction  06/2005  . Rotator cuff repair  06/2008  . Total hip arthroplasty  2009    right  . Defibrilator  04/04/2012    Dual Chamber  . Insert / replace / remove pacemaker  04/04/2012    Current Outpatient Prescriptions  Medication Sig Dispense Refill  . acetaminophen-codeine (TYLENOL #3) 300-30 MG per tablet Take 2 tablets by mouth as needed.       Marland Kitchen amoxicillin (AMOXIL) 500 MG capsule Take 500 mg by mouth as needed.       . desvenlafaxine (PRISTIQ) 50 MG 24 hr tablet Take 50 mg by mouth daily.        . hydrochlorothiazide (HYDRODIURIL) 25 MG tablet Take 12.5 mg by mouth as needed.       Marland Kitchen lisinopril (PRINIVIL,ZESTRIL) 40 MG tablet TAKE ONE TABLET BY MOUTH EVERY DAY  30 tablet  6   No current facility-administered medications for this visit.    Allergies  Allergen Reactions  . Codeine Nausea And Vomiting  . Lodine [Etodolac] Rash  . Percocet [Oxycodone-Acetaminophen] Rash    Tolerates tylenol    Review of Systems negative except from HPI and  PMH  Physical Exam BP 110/80  Pulse 72  Ht 5\' 11"  (1.803 m)  Wt 199 lb 8 oz (90.493 kg)  BMI 27.84 kg/m2 Well developed and well nourished in no acute distress HENT normal E scleral and icterus clear Neck Supple JVP flat; carotids brisk and full Clear to ausculation  Regular rate and rhythm, no murmurs gallops or rub Soft with active bowel sounds No clubbing cyanosis  Edema Alert and oriented, grossly normal motor and sensory function Skin Warm and Dry    Assessment and  Plan  Complete Heart Block Intermittent  Pacemaker  Medtronic  The patient's device was interrogated.  The information was reviewed. No changes were made in the programming.    Hypertension  BP is well controlled.  Will stop clonidine and continue lisinopril;  Renal function normal in 3/15

## 2014-05-08 NOTE — Patient Instructions (Addendum)
Remote monitoring is used to monitor your Pacemaker of ICD from home. This monitoring reduces the number of office visits required to check your device to one time per year. It allows [place] to keep an eye on the functioning of your device to ensure it is working properly. You are scheduled for a device check from home on 08/11/14. You may send your transmission at any time that day. If you have a wireless device, the transmission will be sent automatically. After your physician reviews your transmission, you will receive a postcard with your next transmission date.   Your physician wants you to follow-up in: 1 year. You will receive a reminder letter in the mail two months in advance. If you don't receive a letter, please call our office to schedule the follow-up appointment.  Your physician has recommended you make the following change in your medication:  Stop Clonidine

## 2014-05-23 ENCOUNTER — Telehealth: Payer: Self-pay | Admitting: Neurology

## 2014-05-23 NOTE — Telephone Encounter (Signed)
Patient's wife calling to state that she has been waiting since 04/25/14 regarding the form for patient's 4 wheel walker. Please return call to patient and advise.

## 2014-05-26 ENCOUNTER — Telehealth: Payer: Self-pay | Admitting: Neurology

## 2014-05-26 NOTE — Telephone Encounter (Signed)
Called pt and spoke with pt's wife Glena Norfolkolly informing her that I had just talk with [facility] to give them the dx code for the rolling walker and wife Glena Norfolkolly stated that she just wanted to apologize to Dr. Frances FurbishAthar, because she thought that it was our office that was being slow about this issue and that it wasn't. Pt's wife just wanted to apologize to Dr. Frances FurbishAthar again. I advised the wife that if the pt has any other problems, questions or concerns to call the office. Wife verbalized understanding. FYI

## 2014-05-26 NOTE — Telephone Encounter (Signed)
Called Koleen NimrodAdrian with [facility] to inform her that the pt's primary dx was parkinson's disease and the code for that was 332.0, for the pt to get a rolling walker. I advised Koleen Nimroddrian that if she needs anything else to give our office a call back. Koleen Nimroddrian verbalized understanding.

## 2014-05-26 NOTE — Telephone Encounter (Signed)
[facility] need dx code for [employer] walk[employer] order received.  Please return call with code [phone]..thanks

## 2014-06-09 ENCOUNTER — Other Ambulatory Visit: Payer: Self-pay | Admitting: Cardiovascular Disease

## 2014-07-01 ENCOUNTER — Ambulatory Visit: Payer: Medicare Other | Admitting: Neurology

## 2014-07-17 NOTE — Telephone Encounter (Signed)
Noted  

## 2014-08-11 ENCOUNTER — Ambulatory Visit (INDEPENDENT_AMBULATORY_CARE_PROVIDER_SITE_OTHER): Payer: Medicare Other | Admitting: *Deleted

## 2014-08-11 ENCOUNTER — Telehealth: Payer: Self-pay | Admitting: Cardiology

## 2014-08-11 DIAGNOSIS — I442 Atrioventricular block, complete: Secondary | ICD-10-CM

## 2014-08-11 NOTE — Telephone Encounter (Signed)
Spoke with pt and reminded pt of remote transmission that is due today. Pt verbalized understanding.   

## 2014-08-11 NOTE — Progress Notes (Signed)
Remote pacemaker transmission.   

## 2014-08-12 LAB — MDC_IDC_ENUM_SESS_TYPE_REMOTE
Battery Remaining Longevity: 107 mo
Battery Voltage: 2.79 V
Lead Channel Impedance Value: 604 Ohm
Lead Channel Pacing Threshold Amplitude: 0.5 V
Lead Channel Pacing Threshold Amplitude: 0.625 V
Lead Channel Setting Pacing Amplitude: 1.5 V
Lead Channel Setting Pacing Amplitude: 2 V
Lead Channel Setting Pacing Pulse Width: 0.4 ms
Lead Channel Setting Sensing Sensitivity: 4 mV
MDC IDC MSMT BATTERY IMPEDANCE: 249 Ohm
MDC IDC MSMT LEADCHNL RA IMPEDANCE VALUE: 528 Ohm
MDC IDC MSMT LEADCHNL RA PACING THRESHOLD PULSEWIDTH: 0.4 ms
MDC IDC MSMT LEADCHNL RA SENSING INTR AMPL: 2.8 mV
MDC IDC MSMT LEADCHNL RV PACING THRESHOLD PULSEWIDTH: 0.4 ms
MDC IDC SESS DTM: 20150831145533
MDC IDC STAT BRADY AP VP PERCENT: 15 %
MDC IDC STAT BRADY AP VS PERCENT: 1 %
MDC IDC STAT BRADY AS VP PERCENT: 82 %
MDC IDC STAT BRADY AS VS PERCENT: 2 %

## 2014-08-21 ENCOUNTER — Encounter: Payer: Self-pay | Admitting: Cardiology

## 2014-08-26 ENCOUNTER — Encounter: Payer: Self-pay | Admitting: Cardiology

## 2014-09-03 ENCOUNTER — Other Ambulatory Visit: Payer: Self-pay | Admitting: Cardiovascular Disease

## 2014-09-10 ENCOUNTER — Telehealth: Payer: Self-pay

## 2014-09-10 NOTE — Telephone Encounter (Signed)
Spoke w/ pt's wife.  Advised her that Dr. Graciela HusbandsKlein had stopped clonidine at last ov in May.  She states that they had discussed holding clonidine or lisinopril at that time, but pt's cough is getting worse, so he would like to go back on the clonidine.  Advised her that this was not mentioned in Dr. Odessa FlemingKlein's note, so I cannot advise her to make this change w/o MD approval.  Advised her that pt is due for a 6 month f/u w/ Dr. Mariah MillingGollan if he would like to set this up to discuss.  She is agreeable to this and would like to set up appt after they move on 10/11.

## 2014-09-10 NOTE — Telephone Encounter (Signed)
Pt needs refill for Clonidine

## 2014-09-11 ENCOUNTER — Encounter: Payer: Self-pay | Admitting: Internal Medicine

## 2014-09-11 DIAGNOSIS — I82409 Acute embolism and thrombosis of unspecified deep veins of unspecified lower extremity: Secondary | ICD-10-CM | POA: Insufficient documentation

## 2014-09-11 HISTORY — DX: Acute embolism and thrombosis of unspecified deep veins of unspecified lower extremity: I82.409

## 2014-09-23 ENCOUNTER — Ambulatory Visit (INDEPENDENT_AMBULATORY_CARE_PROVIDER_SITE_OTHER): Payer: Medicare Other | Admitting: Cardiovascular Disease

## 2014-09-23 ENCOUNTER — Encounter: Payer: Self-pay | Admitting: Cardiovascular Disease

## 2014-09-23 VITALS — BP 115/75 | HR 78 | Ht 71.0 in | Wt 199.2 lb

## 2014-09-23 DIAGNOSIS — G3184 Mild cognitive impairment, so stated: Secondary | ICD-10-CM

## 2014-09-23 DIAGNOSIS — I442 Atrioventricular block, complete: Secondary | ICD-10-CM

## 2014-09-23 DIAGNOSIS — I10 Essential (primary) hypertension: Secondary | ICD-10-CM

## 2014-09-23 DIAGNOSIS — R609 Edema, unspecified: Secondary | ICD-10-CM

## 2014-09-23 DIAGNOSIS — R0602 Shortness of breath: Secondary | ICD-10-CM

## 2014-09-23 DIAGNOSIS — Z950 Presence of cardiac pacemaker: Secondary | ICD-10-CM

## 2014-09-23 DIAGNOSIS — R351 Nocturia: Secondary | ICD-10-CM | POA: Insufficient documentation

## 2014-09-23 NOTE — Progress Notes (Signed)
Patient ID: Randy JakschWilliam Richard [name], male    DOB: 1934/03/22, 80 y.o.   MRN: [id number]  HPI Comments: Randy [name] is a very pleasant 80 year old gentleman with a long history of hypertension, possible depression and anxiety, tremor, difficulty tolerating various blood pressure medications secondary to side effects, Patient of Dr. Burnett ShengHedrick, who presents for routine followup.  History of complete heart block, status post pacer placed in 2013  In followup today, his gait is unstable, has severe hip and back pain. He is out of his Tylenol #3 and is taking NSAIDs and Tylenol. He is not doing any significant exercise secondary to chronic pain.  Wife reports that NSAIDs makes him very "fuzzy" in the head.  He has nocturia, possible improvement with HCTZ in the daytime, He ran out of his lisinopril one week ago, started taking clonidine for several days and has been out of clonidine for the past 2 days. Systolic pressure today 110. On my recheck was 115  Previously seen by neurology. Mild dementia, motor deficits with balance issues. Also has spinal stenosis.  Legs are weak, needs assistance getting out of a chair.  Rare fall, none recently   total cholesterol 170, LDL 94   EKG shows paced rhythm with rate 78 beats per minute  Outpatient Encounter Prescriptions as of 09/23/2014  Medication Sig  . acetaminophen-codeine (TYLENOL #3) 300-30 MG per tablet Take 2 tablets by mouth as needed.   Marland Kitchen. amoxicillin (AMOXIL) 500 MG capsule Take 500 mg by mouth as needed.   Marland Kitchen. aspirin 81 MG tablet Take 81 mg by mouth daily.  Marland Kitchen. desvenlafaxine (PRISTIQ) 50 MG 24 hr tablet Take 50 mg by mouth daily.    . hydrochlorothiazide (HYDRODIURIL) 25 MG tablet TAKE ONE TABLET BY MOUTH EVERY DAY  . lisinopril (PRINIVIL,ZESTRIL) 40 MG tablet  has not been taking for one week    Review of Systems  HENT: Negative.   Eyes: Negative.   Respiratory: Negative.   Cardiovascular: Negative.   Gastrointestinal: Negative.    Endocrine: Negative.   Genitourinary:       Nocturia  Musculoskeletal: Positive for gait problem.  Skin: Negative.   Allergic/Immunologic: Negative.   Neurological: Positive for weakness.  Hematological: Negative.   Psychiatric/Behavioral: Positive for sleep disturbance and dysphoric mood.       Memory problems  All other systems reviewed and are negative.   BP 115/75  Pulse 78  Ht 5\' 11"  (1.803 m)  Wt 199 lb 4 oz (90.379 kg)  BMI 27.80 kg/m2  Physical Exam  Nursing note and vitals reviewed. Constitutional: He is oriented to person, place, and time. He appears well-developed and well-nourished.  Difficulty transferring from chair to table  HENT:  Head: Normocephalic.  Nose: Nose normal.  Mouth/Throat: Oropharynx is clear and moist.  Eyes: Conjunctivae are normal. Pupils are equal, round, and reactive to light.  Neck: Normal range of motion. Neck supple. No JVD present.  Cardiovascular: Normal rate, regular rhythm, S1 normal, S2 normal, normal heart sounds and intact distal pulses.  Exam reveals no gallop and no friction rub.   No murmur heard. Pulmonary/Chest: Effort normal and breath sounds normal. No respiratory distress. He has no wheezes. He has no rales. He exhibits no tenderness.  Abdominal: Soft. Bowel sounds are normal. He exhibits no distension. There is no tenderness.  Musculoskeletal: Normal range of motion. He exhibits no edema and no tenderness.  Lymphadenopathy:    He has no cervical adenopathy.  Neurological: He is alert and oriented to  person, place, and time. Coordination normal.  Full exam was not performed  Skin: Skin is warm and dry. No rash noted. No erythema.  Psychiatric: He has a normal mood and affect. His behavior is normal. Judgment and thought content normal.      Assessment and Plan

## 2014-09-23 NOTE — Assessment & Plan Note (Signed)
Lower extremity swelling likely from venous insufficiency. Recommended compression hose

## 2014-09-23 NOTE — Assessment & Plan Note (Signed)
Status post pacemaker, followed by Dr. Klein 

## 2014-09-23 NOTE — Assessment & Plan Note (Signed)
Difficulty with memory and motor skills concerning for Lewy Body. Diagnosis is unclear at this time . He has followup with neurology

## 2014-09-23 NOTE — Assessment & Plan Note (Signed)
He has followup with urology. We did mention to his wife that if the nocturia is excessive, with significant volume, he might need desmopressin before bed

## 2014-09-23 NOTE — Assessment & Plan Note (Signed)
Routine followup with Dr. Berton MountSteve Klein, EP

## 2014-09-23 NOTE — Assessment & Plan Note (Signed)
Recommended he hold the lisinopril. He has not taken this for one week. Continue HCTZ We'll need basic metabolic panel sees Dr. Burnett ShengHedrick

## 2014-09-23 NOTE — Patient Instructions (Addendum)
You are doing well. Hold the lisinopril Stay on the HCTZ one a day Please monitor the blood pressure   Ask Dr. Burnett ShengHedrick to check labs when you see him Ask urology about desmopressin  Please call us if you have new issues that need to be addressed before your next appt.  Your physician wants you to follow-up in: 6 months.  You will receive a reminder letter in the mail two months in advance. If you don't receive a letter, please call our office to schedule the follow-up appointment.  Your next appointment will be scheduled in our new office located at :  [facility]  [address], [address]  [place], [place] [id number]

## 2014-09-24 ENCOUNTER — Ambulatory Visit: Payer: Medicare Other | Admitting: Cardiovascular Disease

## 2014-09-29 ENCOUNTER — Encounter: Payer: Self-pay | Admitting: Neurology

## 2014-09-29 ENCOUNTER — Ambulatory Visit (INDEPENDENT_AMBULATORY_CARE_PROVIDER_SITE_OTHER): Payer: Medicare Other | Admitting: Neurology

## 2014-09-29 VITALS — BP 155/90 | HR 86 | Temp 98.6°F | Ht 71.0 in | Wt 196.0 lb

## 2014-09-29 DIAGNOSIS — R269 Unspecified abnormalities of gait and mobility: Secondary | ICD-10-CM

## 2014-09-29 DIAGNOSIS — Z96641 Presence of right artificial hip joint: Secondary | ICD-10-CM

## 2014-09-29 DIAGNOSIS — Z96652 Presence of left artificial knee joint: Secondary | ICD-10-CM

## 2014-09-29 DIAGNOSIS — M7989 Other specified soft tissue disorders: Secondary | ICD-10-CM

## 2014-09-29 DIAGNOSIS — M4806 Spinal stenosis, lumbar region: Secondary | ICD-10-CM

## 2014-09-29 DIAGNOSIS — G8929 Other chronic pain: Secondary | ICD-10-CM

## 2014-09-29 DIAGNOSIS — G3184 Mild cognitive impairment, so stated: Secondary | ICD-10-CM

## 2014-09-29 DIAGNOSIS — M48061 Spinal stenosis, lumbar region without neurogenic claudication: Secondary | ICD-10-CM

## 2014-09-29 NOTE — Progress Notes (Signed)
Subjective:    Patient ID: Randy [name] is a 80 y.o. male.  HPI    Interim history:   Randy [name] is a 80 year old right-handed gentleman with an underlying medical history of complete heart block, status post pacemaker placement, arthritis, hypertension, depression, anxiety, memory loss, and peripheral neuropathy, who presents for followup consultation of his lower extremity weakness, neuropathy and parkinsonism. He also has a history of spinal stenosis and was not deemed a surgical candidate. He has significant issues with arthritis in multiple joints and has had multiple surgeries. He is accompanied by his wife today. I last saw him on 04/25/2014, at which time his wife reported that he had seen a spine specialist and had an epidural injection which helped. He had undergone the pneumonia shot and developed chills. Speech therapy had helped. He also finished aqua therapy which helped. He was supposed to see a urologist. I provided him with a prescription for rolling walker. I did not make any medication changes at the time and felt that he was not a good candidate for Sinemet trial.  Today, his wife reports that they moved to a cottage at [employer] at [place], which is in [place] and this has been a big transition. He has been on Tylenol #3 per his spine doctor and his orthopedic doctor. He uses a walker with seat and a 3 wheeled walker for appointments. He has not fallen, but notices, he leans to the R. He has L hip and L knee pain.    I saw him on 02/05/2014, at which time I felt he had mild parkinsonism, history of mild RBD, and gait disorder in the context of neuropathy and osteoarthritis affecting multiple joints, spinal stenosis and status post multiple surgeries. I encouraged him to increase his water intake and did not suggest any new medications. In the past, Dr. Erling Cruz felt he did not have Parkinson's disease. He was supposed to see Dr. Edger House at the [facility]  in March 2015. He reported being on Pristiq. We talked about a referral to urology for his nocturia and his urinary incontinence but he requested to hold off.  I saw him on 10/22/2013 for a sooner than scheduled appointment due to worsening memory and worsening tremors and weaker speech, at which time I felt he had a history consistent with RBD, I noted mild memory impairment in the realm of mild cognitive impairment and mild parkinsonism for which I did not initiate any medication. I referred him to [place]. He did not get scheduled until March.  I saw him on 08/05/2013, at which time I did not make any medication changes. I did note some mild left-sided parkinsonism. He was in the process of closing up a pond and while working on this, he slid into the hole and bruised his L shoulder and the L wrist. He was seen by his orthopedic surgeon for his L hip, but was advised against surgery. He has had dream enactments for the past 4 years. It happens about 1/week.  I first met him on 02/05/2013 and he previously followed with Dr. Morene Antu. He has a history of bilateral lower extremity weakness. He is status post multiple knee surgeries in 1974, 1979, 1996, and status post knee replacement surgeries in 1997, status post right total hip replacement surgery in 2009, who has had over the past several years weakness and soreness in his proximal lower extremities, going on for at least 5 years. His weakness has not been  very progressive. MRI lumbar spine in March 2009 showed bilateral neural foraminal narrowing at L5-S1 and possible nerve root compromise on the left at L4-5. EMG and nerve conduction studies on 11/09/2010 was unremarkable except for slightly prolonged at wave latencies. There was no evidence of myopathy, he had workup including serum electrophoresis, TSH, ESR, CK, B12 all normal in November 2011. 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 Erling Cruz in July 2013 at which time Dr.  Erling Cruz suggested a one year followup because the patient was deemed stable. The patient presented for a sooner than scheduled followup on 02/05/2013 as he had some medication changes. He had stopped Pristiq for the second time but after 3 months realize that his anxiety and depression flared up and restarted it in January. He has had a change in his blood pressure medication and reported that he may need shoulder surgery. He has had tremors which have been stable.   His Past Medical History Is Significant For: Past Medical History  Diagnosis Date  . Hypertension   . Depression   . Anxiety   . Tremor   . Memory deficit 03/24/2011  . Pacemaker   . Shortness of breath   . Complete heart block   . Blood transfusion     hx of autologus transfusion  . Neuromuscular disorder     peripheral  neuropathy  . Arthritis   . Parkinson's disease   . Parkinsonism 10/22/2013  . MCI (mild cognitive impairment) 10/22/2013    His Past Surgical History Is Significant For: Past Surgical History  Procedure Laterality Date  . Knee surgery  [phone]  . Replacement total knee bilateral  1997  . Umbilical hernia repair  2001  . Cataract extraction  06/2005  . Rotator cuff repair  06/2008  . Total hip arthroplasty  2009    right  . Defibrilator  04/04/2012    Dual Chamber  . Insert / replace / remove pacemaker  04/04/2012    His Family History Is Significant For: Family History  Problem Relation Age of Onset  . Hypertension Mother   . Hypertension Father   . Seizures Father     epilepsy  . Prostate cancer Other   . Cancer Other   . Hypertension Other   . Asthma Other     His Social History Is Significant For: History   Social History  . Marital Status: Married    Spouse Name: Polly    Number of Children: 3  . Years of Education: College   Occupational History  . Retired    Social History Main Topics  . Smoking status: Former Smoker -- 1.00 packs/day for 18 years    Types:  Cigarettes    Quit date: 03/20/1973  . Smokeless tobacco: Never Used  . Alcohol Use: Yes     Comment: occasional  . Drug Use: No  . Sexual Activity: Not Currently   Other Topics Concern  . Not on file   Social History Narrative   Patient lives at home with spouse.   Caffeine Use: 2 cups of coffee daily    His Allergies Are:  Allergies  Allergen Reactions  . Citalopram     Other reaction(s): Unknown  . Codeine Nausea And Vomiting  . Sertraline     Other reaction(s): Unknown  . Lodine [Etodolac] Rash  . Percocet [Oxycodone-Acetaminophen] Rash    Tolerates tylenol  :   His Current Medications Are:  Outpatient Encounter Prescriptions as  of 09/29/2014  Medication Sig  . acetaminophen-codeine (TYLENOL #3) 300-30 MG per tablet Take 2 tablets by mouth as needed.   Marland Kitchen amoxicillin (AMOXIL) 500 MG capsule Take 500 mg by mouth as needed.   Marland Kitchen aspirin 81 MG tablet Take 81 mg by mouth daily.  Marland Kitchen desmopressin (DDAVP) 0.2 MG tablet Take 0.2 mg by mouth daily. At bedtime  . desvenlafaxine (PRISTIQ) 50 MG 24 hr tablet Take 50 mg by mouth daily.    . hydrochlorothiazide (HYDRODIURIL) 25 MG tablet TAKE ONE TABLET BY MOUTH EVERY DAY  . [DISCONTINUED] lisinopril (PRINIVIL,ZESTRIL) 40 MG tablet TAKE ONE TABLET BY MOUTH EVERY DAY  :  Review of Systems:  Out of a complete 14 point review of systems, all are reviewed and negative with the exception of these symptoms as listed below:   Review of Systems  HENT: Positive for drooling.   Genitourinary:       Frequency of urination  Musculoskeletal: Positive for back pain and neck stiffness.       Walking difficulty,  Aching muscles, joint swelling,joint pain  Neurological: Positive for weakness and numbness.       Snoring, sleep talking,   Hematological: Bruises/bleeds easily.  Psychiatric/Behavioral:       Depression    Objective:  Neurologic Exam  Physical Exam Physical Examination:   Filed Vitals:   09/29/14 1149  BP: 155/90   Pulse: 86  Temp: 98.6 F (37 C)    General Examination: The patient is a very pleasant 80 y.o. male in no acute distress. He appears well-developed and well-nourished and very well groomed.   HEENT exam: Normocephalic, atraumatic, pupils are equal, round and reactive to light and accommodation. Extraocular tracking is shows mild saccadic breakdown without nystagmus. Hearing is mildly impaired. Neck is mildly rigid with full range of motion. He has no lip, neck or jaw tremor. Oropharynx examination reveals a mild to moderately tight airway primarily secondary to enlarged uvula which also is elongated and reaches far back down. He has mild to moderate mouth dryness. Face is symmetric with perhaps mild facial masking. Palate elevates symmetrically and tongue protrudes centrally. Speech is mildly hypophonic. He has a slight degree of raspiness in his voice. He has no dysarthria. He has a Mallampati class II.   Chest is clear to auscultation, heart sounds are normal without murmurs, rubs or gallops, abdomen is soft, with normal bowel sounds.   He has 2+ pitting edema in the left lower extremity without any palpable cord or tenderness in his calf noted. He has 1+ edema in the right lower extremity. This seems to be new.    Musculoskeletal: he has pain in the R SI joint, in the the groins bilateral and L knee and his L hip. He has bilateral knee surgery scars.  Neurologically: Mental status: The patient is awake, alert and oriented in all 4 spheres. He is able to give a good history. His memory, attention, language and knowledge are mildly impaired. He is oriented to time, place, self, circumstance, season, reason for visit.  09/29/2014: MMSE: 28/30, CDT: 3/4, AFT: 18  Cranial nerves II-XII are as described under HEENT exam. Motor exam: He does have a mildly decreased bulk in both thighs. Otherwise bulk, tone and strength is normal with the exception of mild hip flexor weakness bilaterally, L more than  R. He also reports left hip pain. Reflexes are 1+ in the upper extremities, trace in both knees and absent in both ankles. Cerebellar testing shows no  dysmetria or intention tremor. He has no resting tremor. He does have a mild degree of postural tremor in the left upper extremity and a minimal degree of postural tremor in the right upper extremity, unchanged. The same is true for action tremor which is mild on the left and minimal on the right. Finger taps are mildly impaired bilaterally and rapid alternating patting is fairly appropriate. Foot agility is mildly impaired on the L. Romberg testing shows mild swaying but no corrective steps. Sensory exam is decreased for vibration sense, temperature, pinprick and light touch in the distal lower extremities bilaterally in the distal lower one third, unchanged. He stands slowly and has pain in his hips and his L knee almost gave out in the beginning. He walks insecurely without his walker, he has a limp on the L and turns in 5 steps, and slowly. His left leg is slightly turned out and his left knee is not completely stretched out when walking and he has L>R hip pain.  Assessment and Plan:    In summary, Mr. Berkel is a very pleasant 80 year old gentleman with a history of gait disorder, mild weakness, neuropathy, in the context of an underlying history of significant osteoarthritis affecting multiple joints, lumbar spinal stenosis and s/p multiple surgeries, particularly joint related surgeries and joint replacement surgeries. He has some changes in his fine motor function and has mild memory loss and mild dream enactments, as well as mild parkinsonism and has been off of parkinson's medications. His biggest complaints continue to be arthritic and pain related, in particular pain in the L hip, now also his right hip which had the replacement surgery, and his left knee which also had replacement surgery. He had 2 epidural injections which the latest one in June did  not help very much he reports. He is wondering if he should consider another injection in his right hip. I noticed some swelling todayin both lower extremities, left more so than right. He is encouraged to see his primary care physician for this. I did not detect any tenderness in his calves or any palpable cord. He has no particular new looking discoloration but has chronic looking venous insufficiency-type changes in his lower extremities. His gait has become worse. He is relying heavily on his rolling walker. His pain has become worse as well. He is encouraged to use his walker at all times, and continue to see his orthopedist as well. I would like to see him back in about 4 months for recheck. I did not suggest any new medications. I asked him to stay well-hydrated.   I answered all their questions today and the patient and his wife were in agreement.

## 2014-09-29 NOTE — Patient Instructions (Signed)
We will continue with the walker and supportive care. Try to stay better hydrated. I would like for you to see your primary care doctor for your L leg swelling.

## 2014-10-08 ENCOUNTER — Ambulatory Visit: Payer: Self-pay | Admitting: Family Medicine

## 2014-10-12 ENCOUNTER — Ambulatory Visit: Payer: Self-pay | Admitting: Hematology and Oncology

## 2014-10-15 ENCOUNTER — Inpatient Hospital Stay: Payer: Self-pay | Admitting: Hematology and Oncology

## 2014-10-15 ENCOUNTER — Ambulatory Visit: Payer: Self-pay | Admitting: Hematology and Oncology

## 2014-10-15 LAB — BASIC METABOLIC PANEL
Anion Gap: 5 — ABNORMAL LOW (ref 7–16)
BUN: 24 mg/dL — AB (ref 7–18)
CHLORIDE: 103 mmol/L (ref 98–107)
CO2: 33 mmol/L — AB (ref 21–32)
Calcium, Total: 8.5 mg/dL (ref 8.5–10.1)
Creatinine: 0.98 mg/dL (ref 0.60–1.30)
EGFR (African American): >60
Glucose: 116 mg/dL — ABNORMAL HIGH (ref 65–99)
Osmolality: 286 (ref 275–301)
Potassium: 4.2 mmol/L (ref 3.5–5.1)
Sodium: 141 mmol/L (ref 136–145)

## 2014-10-15 LAB — CBC WITH DIFFERENTIAL/PLATELET
BASOS PCT: 1 %
Basophil #: 0.1 10*3/uL (ref 0.0–0.1)
EOS ABS: 0.5 10*3/uL (ref 0.0–0.7)
EOS PCT: 5.7 %
HCT: 40 % (ref 40.0–52.0)
HGB: 13.3 g/dL (ref 13.0–18.0)
LYMPHS ABS: 1.9 10*3/uL (ref 1.0–3.6)
Lymphocyte %: 21.1 %
MCH: 32.6 pg (ref 26.0–34.0)
MCHC: 33.2 g/dL (ref 32.0–36.0)
MCV: 98 fL (ref 80–100)
MONO ABS: 0.6 x10 3/mm (ref 0.2–1.0)
Monocyte %: 7 %
NEUTROS ABS: 5.8 10*3/uL (ref 1.4–6.5)
NEUTROS PCT: 65.2 %
Platelet: 171 10*3/uL (ref 150–440)
RBC: 4.08 10*6/uL — ABNORMAL LOW (ref 4.40–5.90)
RDW: 13.6 % (ref 11.5–14.5)
WBC: 8.9 10*3/uL (ref 3.8–10.6)

## 2014-10-15 LAB — URINALYSIS, COMPLETE
BACTERIA: NONE SEEN
Bilirubin,UR: NEGATIVE
GLUCOSE, UR: NEGATIVE mg/dL (ref 0–75)
Ketone: NEGATIVE
Leukocyte Esterase: NEGATIVE
Nitrite: NEGATIVE
PROTEIN: NEGATIVE
Ph: 6 (ref 4.5–8.0)
SQUAMOUS EPITHELIAL: NONE SEEN
Specific Gravity: 1.008 (ref 1.003–1.030)
WBC UR: NONE SEEN /HPF (ref 0–5)

## 2014-10-15 LAB — PROTIME-INR
INR: 1.9
PROTHROMBIN TIME: 21.2 s — AB (ref 11.5–14.7)

## 2014-10-15 LAB — HEPARIN LEVEL (UNFRACTIONATED)

## 2014-10-15 LAB — APTT: Activated PTT: 38.7 secs — ABNORMAL HIGH (ref 23.6–35.9)

## 2014-10-16 DIAGNOSIS — I517 Cardiomegaly: Secondary | ICD-10-CM

## 2014-10-16 LAB — APTT
Activated PTT: 104 secs — ABNORMAL HIGH (ref 23.6–35.9)
Activated PTT: 66.6 secs — ABNORMAL HIGH (ref 23.6–35.9)

## 2014-10-16 LAB — CBC WITH DIFFERENTIAL/PLATELET
BASOS PCT: 1.4 %
Basophil #: 0.1 10*3/uL (ref 0.0–0.1)
Eosinophil #: 0.7 10*3/uL (ref 0.0–0.7)
Eosinophil %: 8.8 %
HCT: 36.8 % — ABNORMAL LOW (ref 40.0–52.0)
HGB: 12.4 g/dL — ABNORMAL LOW (ref 13.0–18.0)
LYMPHS ABS: 2.5 10*3/uL (ref 1.0–3.6)
Lymphocyte %: 31.2 %
MCH: 32.4 pg (ref 26.0–34.0)
MCHC: 33.7 g/dL (ref 32.0–36.0)
MCV: 96 fL (ref 80–100)
Monocyte #: 0.7 x10 3/mm (ref 0.2–1.0)
Monocyte %: 8.7 %
NEUTROS PCT: 49.9 %
Neutrophil #: 4 10*3/uL (ref 1.4–6.5)
Platelet: 144 10*3/uL — ABNORMAL LOW (ref 150–440)
RBC: 3.83 10*6/uL — AB (ref 4.40–5.90)
RDW: 13.3 % (ref 11.5–14.5)
WBC: 8.1 10*3/uL (ref 3.8–10.6)

## 2014-10-16 LAB — BASIC METABOLIC PANEL
ANION GAP: 7 (ref 7–16)
BUN: 21 mg/dL — AB (ref 7–18)
CREATININE: 0.96 mg/dL (ref 0.60–1.30)
Calcium, Total: 8.5 mg/dL (ref 8.5–10.1)
Chloride: 103 mmol/L (ref 98–107)
Co2: 30 mmol/L (ref 21–32)
EGFR (African American): >60
EGFR (Non-African Amer.): >60
Glucose: 114 mg/dL — ABNORMAL HIGH (ref 65–99)
Osmolality: 283 (ref 275–301)
Potassium: 3.5 mmol/L (ref 3.5–5.1)
SODIUM: 140 mmol/L (ref 136–145)

## 2014-10-16 LAB — HEPARIN LEVEL (UNFRACTIONATED)
Anti-Xa(Unfractionated): >1.1 IU/mL (ref 0.30–0.70)
Anti-Xa(Unfractionated): >1.1 IU/mL (ref 0.30–0.70)

## 2014-10-17 LAB — URINALYSIS, COMPLETE
BILIRUBIN, UR: NEGATIVE
BLOOD: NEGATIVE
Bacteria: NONE SEEN
Glucose,UR: NEGATIVE mg/dL (ref 0–75)
Ketone: NEGATIVE
LEUKOCYTE ESTERASE: NEGATIVE
Nitrite: NEGATIVE
PROTEIN: NEGATIVE
Ph: 7 (ref 4.5–8.0)
RBC,UR: <1 /HPF (ref 0–5)
Specific Gravity: 1.008 (ref 1.003–1.030)
Squamous Epithelial: NONE SEEN
WBC UR: NONE SEEN /HPF (ref 0–5)

## 2014-10-17 LAB — CBC WITH DIFFERENTIAL/PLATELET
BASOS PCT: 0.8 %
Basophil #: 0.1 10*3/uL (ref 0.0–0.1)
Eosinophil #: 0.6 10*3/uL (ref 0.0–0.7)
Eosinophil %: 8.4 %
HCT: 37.9 % — ABNORMAL LOW (ref 40.0–52.0)
HGB: 12.7 g/dL — AB (ref 13.0–18.0)
LYMPHS PCT: 39.2 %
Lymphocyte #: 2.7 10*3/uL (ref 1.0–3.6)
MCH: 33 pg (ref 26.0–34.0)
MCHC: 33.4 g/dL (ref 32.0–36.0)
MCV: 99 fL (ref 80–100)
MONO ABS: 0.5 x10 3/mm (ref 0.2–1.0)
Monocyte %: 6.8 %
Neutrophil #: 3.1 10*3/uL (ref 1.4–6.5)
Neutrophil %: 44.8 %
PLATELETS: 141 10*3/uL — AB (ref 150–440)
RBC: 3.84 10*6/uL — AB (ref 4.40–5.90)
RDW: 13.4 % (ref 11.5–14.5)
WBC: 6.8 10*3/uL (ref 3.8–10.6)

## 2014-10-17 LAB — APTT: Activated PTT: 85.6 secs — ABNORMAL HIGH (ref 23.6–35.9)

## 2014-10-17 LAB — HEPARIN LEVEL (UNFRACTIONATED): ANTI-XA(UNFRACTIONATED): 0.65 [IU]/mL (ref 0.30–0.70)

## 2014-10-18 LAB — CBC WITH DIFFERENTIAL/PLATELET
Basophil #: 0 10*3/uL (ref 0.0–0.1)
Basophil %: 0.6 %
EOS ABS: 0.6 10*3/uL (ref 0.0–0.7)
EOS PCT: 8.2 %
HCT: 40.4 % (ref 40.0–52.0)
HGB: 13.2 g/dL (ref 13.0–18.0)
LYMPHS ABS: 2.6 10*3/uL (ref 1.0–3.6)
Lymphocyte %: 33.5 %
MCH: 32.3 pg (ref 26.0–34.0)
MCHC: 32.7 g/dL (ref 32.0–36.0)
MCV: 99 fL (ref 80–100)
Monocyte #: 0.5 x10 3/mm (ref 0.2–1.0)
Monocyte %: 6.6 %
Neutrophil #: 3.9 10*3/uL (ref 1.4–6.5)
Neutrophil %: 51.1 %
Platelet: 151 10*3/uL (ref 150–440)
RBC: 4.1 10*6/uL — ABNORMAL LOW (ref 4.40–5.90)
RDW: 13.8 % (ref 11.5–14.5)
WBC: 7.6 10*3/uL (ref 3.8–10.6)

## 2014-10-18 LAB — URINE CULTURE

## 2014-10-18 LAB — HEPARIN LEVEL (UNFRACTIONATED): Anti-Xa(Unfractionated): 0.68 IU/mL (ref 0.30–0.70)

## 2014-10-18 LAB — APTT: ACTIVATED PTT: 71.9 s — AB (ref 23.6–35.9)

## 2014-10-19 LAB — CBC WITH DIFFERENTIAL/PLATELET
BASOS ABS: 0.1 10*3/uL (ref 0.0–0.1)
BASOS PCT: 0.9 %
EOS PCT: 8.3 %
Eosinophil #: 0.7 10*3/uL (ref 0.0–0.7)
HCT: 40.3 % (ref 40.0–52.0)
HGB: 13.2 g/dL (ref 13.0–18.0)
LYMPHS PCT: 34.3 %
Lymphocyte #: 2.8 10*3/uL (ref 1.0–3.6)
MCH: 32.4 pg (ref 26.0–34.0)
MCHC: 32.9 g/dL (ref 32.0–36.0)
MCV: 99 fL (ref 80–100)
MONOS PCT: 6.8 %
Monocyte #: 0.6 x10 3/mm (ref 0.2–1.0)
NEUTROS ABS: 4.1 10*3/uL (ref 1.4–6.5)
Neutrophil %: 49.7 %
Platelet: 144 10*3/uL — ABNORMAL LOW (ref 150–440)
RBC: 4.08 10*6/uL — ABNORMAL LOW (ref 4.40–5.90)
RDW: 13.6 % (ref 11.5–14.5)
WBC: 8.2 10*3/uL (ref 3.8–10.6)

## 2014-10-19 LAB — APTT: Activated PTT: 81.7 s — ABNORMAL HIGH

## 2014-10-19 LAB — HEPARIN LEVEL (UNFRACTIONATED): Anti-Xa(Unfractionated): 0.59 [IU]/mL

## 2014-10-20 LAB — CBC WITH DIFFERENTIAL/PLATELET
BASOS PCT: 1 %
Basophil #: 0.1 10*3/uL (ref 0.0–0.1)
EOS PCT: 7.6 %
Eosinophil #: 0.6 10*3/uL (ref 0.0–0.7)
HCT: 39.5 % — ABNORMAL LOW (ref 40.0–52.0)
HGB: 13.1 g/dL (ref 13.0–18.0)
LYMPHS PCT: 30.2 %
Lymphocyte #: 2.3 10*3/uL (ref 1.0–3.6)
MCH: 32.5 pg (ref 26.0–34.0)
MCHC: 33.1 g/dL (ref 32.0–36.0)
MCV: 98 fL (ref 80–100)
MONO ABS: 0.5 x10 3/mm (ref 0.2–1.0)
MONOS PCT: 6.4 %
Neutrophil #: 4.2 10*3/uL (ref 1.4–6.5)
Neutrophil %: 54.8 %
Platelet: 144 10*3/uL — ABNORMAL LOW (ref 150–440)
RBC: 4.02 10*6/uL — ABNORMAL LOW (ref 4.40–5.90)
RDW: 13.5 % (ref 11.5–14.5)
WBC: 7.7 10*3/uL (ref 3.8–10.6)

## 2014-10-20 LAB — HEPARIN LEVEL (UNFRACTIONATED): ANTI-XA(UNFRACTIONATED): 0.51 [IU]/mL (ref 0.30–0.70)

## 2014-10-20 LAB — APTT: Activated PTT: 72 secs — ABNORMAL HIGH (ref 23.6–35.9)

## 2014-10-21 ENCOUNTER — Encounter: Payer: Self-pay | Admitting: Internal Medicine

## 2014-10-28 LAB — COMPREHENSIVE METABOLIC PANEL
ALK PHOS: 82 U/L
ANION GAP: 10 (ref 7–16)
AST: 11 U/L — AB (ref 15–37)
Albumin: 3.9 g/dL (ref 3.4–5.0)
BUN: 25 mg/dL — AB (ref 7–18)
Bilirubin,Total: 0.5 mg/dL (ref 0.2–1.0)
Calcium, Total: 9 mg/dL (ref 8.5–10.1)
Chloride: 102 mmol/L (ref 98–107)
Co2: 31 mmol/L (ref 21–32)
Creatinine: 1.13 mg/dL (ref 0.60–1.30)
EGFR (Non-African Amer.): >60
GLUCOSE: 102 mg/dL — AB (ref 65–99)
Osmolality: 290 (ref 275–301)
Potassium: 3.9 mmol/L (ref 3.5–5.1)
SGPT (ALT): 26 U/L
Sodium: 143 mmol/L (ref 136–145)
Total Protein: 6.5 g/dL (ref 6.4–8.2)

## 2014-10-28 LAB — CBC CANCER CENTER
Basophil #: 0.1 x10 3/mm (ref 0.0–0.1)
Basophil %: 1.2 %
EOS PCT: 6.2 %
Eosinophil #: 0.5 x10 3/mm (ref 0.0–0.7)
HCT: 38.8 % — AB (ref 40.0–52.0)
HGB: 12.7 g/dL — AB (ref 13.0–18.0)
Lymphocyte #: 1.9 x10 3/mm (ref 1.0–3.6)
Lymphocyte %: 25.5 %
MCH: 32.2 pg (ref 26.0–34.0)
MCHC: 32.8 g/dL (ref 32.0–36.0)
MCV: 98 fL (ref 80–100)
Monocyte #: 0.5 x10 3/mm (ref 0.2–1.0)
Monocyte %: 6.8 %
Neutrophil #: 4.6 x10 3/mm (ref 1.4–6.5)
Neutrophil %: 60.3 %
Platelet: 172 x10 3/mm (ref 150–440)
RBC: 3.95 10*6/uL — AB (ref 4.40–5.90)
RDW: 13.5 % (ref 11.5–14.5)
WBC: 7.6 x10 3/mm (ref 3.8–10.6)

## 2014-10-28 NOTE — Telephone Encounter (Signed)
This encounter was created in error - please disregard.

## 2014-11-11 ENCOUNTER — Ambulatory Visit: Payer: Self-pay | Admitting: Hematology and Oncology

## 2014-11-11 ENCOUNTER — Encounter: Payer: Self-pay | Admitting: Internal Medicine

## 2014-11-12 ENCOUNTER — Telehealth: Payer: Self-pay | Admitting: Cardiology

## 2014-11-12 ENCOUNTER — Encounter: Payer: Medicare Other | Admitting: *Deleted

## 2014-11-12 NOTE — Telephone Encounter (Signed)
LMOVM reminding pt to send remote transmission.   

## 2014-11-12 NOTE — Telephone Encounter (Signed)
wirex ordered for pt.

## 2014-11-14 ENCOUNTER — Encounter: Payer: Self-pay | Admitting: Cardiology

## 2014-11-20 ENCOUNTER — Encounter (HOSPITAL_COMMUNITY): Payer: Self-pay | Admitting: Internal Medicine

## 2014-11-24 ENCOUNTER — Telehealth: Payer: Self-pay | Admitting: Internal Medicine

## 2014-11-24 ENCOUNTER — Telehealth: Payer: Self-pay | Admitting: *Deleted

## 2014-11-24 NOTE — Telephone Encounter (Signed)
New Msg     Pt attempted to do pacer check but they had an analog unit, she was told a new unit would be sent but instead [employer] sent a box to return analog unit.    Pt wife Glena Norfolkolly would like a call back at [phone].

## 2014-11-24 NOTE — Telephone Encounter (Signed)
Attempted call pt back x2. Number busy.

## 2014-11-25 ENCOUNTER — Ambulatory Visit: Payer: Self-pay | Admitting: Hematology and Oncology

## 2014-11-25 LAB — CBC CANCER CENTER
BASOS ABS: 0.1 x10 3/mm (ref 0.0–0.1)
BASOS PCT: 1.1 %
Eosinophil #: 0.5 x10 3/mm (ref 0.0–0.7)
Eosinophil %: 7.4 %
HCT: 39.1 % — AB (ref 40.0–52.0)
HGB: 12.8 g/dL — AB (ref 13.0–18.0)
Lymphocyte #: 1.9 x10 3/mm (ref 1.0–3.6)
Lymphocyte %: 27.1 %
MCH: 32.3 pg (ref 26.0–34.0)
MCHC: 32.8 g/dL (ref 32.0–36.0)
MCV: 98 fL (ref 80–100)
MONO ABS: 0.5 x10 3/mm (ref 0.2–1.0)
Monocyte %: 7.4 %
NEUTROS PCT: 57 %
Neutrophil #: 4.1 x10 3/mm (ref 1.4–6.5)
Platelet: 145 x10 3/mm — ABNORMAL LOW (ref 150–440)
RBC: 3.98 10*6/uL — ABNORMAL LOW (ref 4.40–5.90)
RDW: 13.7 % (ref 11.5–14.5)
WBC: 7.2 x10 3/mm (ref 3.8–10.6)

## 2014-11-25 NOTE — Telephone Encounter (Signed)
No phone call. Error

## 2014-11-26 ENCOUNTER — Encounter: Payer: Self-pay | Admitting: Family Medicine

## 2014-12-03 ENCOUNTER — Other Ambulatory Visit: Payer: Self-pay | Admitting: Cardiovascular Disease

## 2014-12-08 LAB — CBC CANCER CENTER
Basophil #: 0.1 x10 3/mm (ref 0.0–0.1)
Basophil %: 1.4 %
EOS PCT: 8.1 %
Eosinophil #: 0.5 x10 3/mm (ref 0.0–0.7)
HCT: 38 % — ABNORMAL LOW (ref 40.0–52.0)
HGB: 12.4 g/dL — AB (ref 13.0–18.0)
Lymphocyte #: 1.3 x10 3/mm (ref 1.0–3.6)
Lymphocyte %: 22.9 %
MCH: 32 pg (ref 26.0–34.0)
MCHC: 32.8 g/dL (ref 32.0–36.0)
MCV: 98 fL (ref 80–100)
Monocyte #: 0.3 x10 3/mm (ref 0.2–1.0)
Monocyte %: 5.6 %
NEUTROS PCT: 62 %
Neutrophil #: 3.6 x10 3/mm (ref 1.4–6.5)
Platelet: 150 x10 3/mm (ref 150–440)
RBC: 3.89 10*6/uL — ABNORMAL LOW (ref 4.40–5.90)
RDW: 13.5 % (ref 11.5–14.5)
WBC: 5.8 x10 3/mm (ref 3.8–10.6)

## 2014-12-09 ENCOUNTER — Encounter: Payer: Self-pay | Admitting: *Deleted

## 2014-12-12 ENCOUNTER — Ambulatory Visit: Payer: Self-pay | Admitting: Hematology and Oncology

## 2014-12-12 ENCOUNTER — Encounter: Payer: Self-pay | Admitting: Family Medicine

## 2014-12-12 ENCOUNTER — Encounter: Payer: Self-pay | Admitting: Internal Medicine

## 2014-12-15 ENCOUNTER — Other Ambulatory Visit: Payer: Self-pay

## 2014-12-15 ENCOUNTER — Telehealth: Payer: Self-pay

## 2014-12-15 ENCOUNTER — Other Ambulatory Visit: Payer: Self-pay | Admitting: *Deleted

## 2014-12-15 MED ORDER — LISINOPRIL 40 MG PO TABS: 40.0000 mg | ORAL_TABLET | Freq: Every day | ORAL | Status: DC

## 2014-12-15 NOTE — Telephone Encounter (Signed)
Looks like lisinopril was resumed in [facility] due to elevated BP.  Pt was advised to monitor BP at home.

## 2014-12-15 NOTE — Telephone Encounter (Signed)
Patient requesting Rx refill for Lisinopril 40 mg 1 tablet daily. Pt was in hospital Nov. 9,2015 and told to resume Lisinopril. Pt was told to dc @ last OV by Dr. Mariah Milling 09/2014. Please advise.

## 2014-12-15 NOTE — Telephone Encounter (Signed)
Pt wife called, states pt is switching from Clonidine to Lisinopril, needs a refill. I did not see on pt Med list.  [facility]

## 2014-12-16 LAB — CBC CANCER CENTER
Basophil #: 0.1 x10 3/mm (ref 0.0–0.1)
Basophil %: 1.2 %
EOS PCT: 6.3 %
Eosinophil #: 0.5 x10 3/mm (ref 0.0–0.7)
HCT: 38.6 % — ABNORMAL LOW (ref 40.0–52.0)
HGB: 12.9 g/dL — ABNORMAL LOW (ref 13.0–18.0)
LYMPHS ABS: 2 x10 3/mm (ref 1.0–3.6)
Lymphocyte %: 23.9 %
MCH: 32.4 pg (ref 26.0–34.0)
MCHC: 33.3 g/dL (ref 32.0–36.0)
MCV: 97 fL (ref 80–100)
MONOS PCT: 5.7 %
Monocyte #: 0.5 x10 3/mm (ref 0.2–1.0)
Neutrophil #: 5.2 x10 3/mm (ref 1.4–6.5)
Neutrophil %: 62.9 %
Platelet: 165 x10 3/mm (ref 150–440)
RBC: 3.97 10*6/uL — AB (ref 4.40–5.90)
RDW: 13.6 % (ref 11.5–14.5)
WBC: 8.3 x10 3/mm (ref 3.8–10.6)

## 2014-12-31 ENCOUNTER — Telehealth: Payer: Self-pay | Admitting: Neurology

## 2014-12-31 NOTE — Telephone Encounter (Signed)
Called patient and left message to return call back to reschedule appointment for 01/30/15

## 2015-01-01 NOTE — Telephone Encounter (Signed)
Called,spoke with wife and confirmed appointment change for 01/27/15.

## 2015-01-05 ENCOUNTER — Telehealth: Payer: Self-pay | Admitting: Cardiology

## 2015-01-05 NOTE — Telephone Encounter (Signed)
Pt wife called and stated that she received a return kit and sent home monitor back. She stated that she has a wirex. Explained to pt wife that return kit was sent on mistake. Pt wife agreed to appt w/ device clinic in [place] on 2-10 at 2:30 PM.  

## 2015-01-12 ENCOUNTER — Ambulatory Visit: Payer: Self-pay

## 2015-01-12 ENCOUNTER — Ambulatory Visit: Payer: Self-pay | Admitting: Hematology and Oncology

## 2015-01-12 ENCOUNTER — Encounter: Payer: Self-pay | Admitting: Family Medicine

## 2015-01-13 LAB — CBC CANCER CENTER
BASOS ABS: 0.1 x10 3/mm (ref 0.0–0.1)
Basophil %: 0.8 %
EOS PCT: 6.7 %
Eosinophil #: 0.5 x10 3/mm (ref 0.0–0.7)
HCT: 38.6 % — ABNORMAL LOW (ref 40.0–52.0)
HGB: 13 g/dL (ref 13.0–18.0)
Lymphocyte #: 1.8 x10 3/mm (ref 1.0–3.6)
Lymphocyte %: 25.6 %
MCH: 32.6 pg (ref 26.0–34.0)
MCHC: 33.7 g/dL (ref 32.0–36.0)
MCV: 97 fL (ref 80–100)
MONOS PCT: 6.7 %
Monocyte #: 0.5 x10 3/mm (ref 0.2–1.0)
NEUTROS PCT: 60.2 %
Neutrophil #: 4.3 x10 3/mm (ref 1.4–6.5)
PLATELETS: 149 x10 3/mm — AB (ref 150–440)
RBC: 3.98 10*6/uL — ABNORMAL LOW (ref 4.40–5.90)
RDW: 13.9 % (ref 11.5–14.5)
WBC: 7.1 x10 3/mm (ref 3.8–10.6)

## 2015-01-21 ENCOUNTER — Ambulatory Visit (INDEPENDENT_AMBULATORY_CARE_PROVIDER_SITE_OTHER): Payer: Medicare Other | Admitting: *Deleted

## 2015-01-21 DIAGNOSIS — I442 Atrioventricular block, complete: Secondary | ICD-10-CM | POA: Diagnosis not present

## 2015-01-21 LAB — MDC_IDC_ENUM_SESS_TYPE_INCLINIC
Battery Impedance: 298 Ohm
Brady Statistic AP VS Percent: 0 %
Brady Statistic AS VP Percent: 76 %
Date Time Interrogation Session: 20160210145955
Lead Channel Impedance Value: 457 Ohm
Lead Channel Impedance Value: 493 Ohm
Lead Channel Pacing Threshold Amplitude: 0.5 V
Lead Channel Pacing Threshold Amplitude: 0.5 V
Lead Channel Pacing Threshold Pulse Width: 0.4 ms
Lead Channel Sensing Intrinsic Amplitude: 15.67 mV
Lead Channel Sensing Intrinsic Amplitude: 4 mV
Lead Channel Setting Sensing Sensitivity: 4 mV
MDC IDC MSMT BATTERY REMAINING LONGEVITY: 99 mo
MDC IDC MSMT BATTERY VOLTAGE: 2.79 V
MDC IDC MSMT LEADCHNL RV PACING THRESHOLD PULSEWIDTH: 0.4 ms
MDC IDC SET LEADCHNL RA PACING AMPLITUDE: 1.5 V
MDC IDC SET LEADCHNL RV PACING AMPLITUDE: 2 V
MDC IDC SET LEADCHNL RV PACING PULSEWIDTH: 0.4 ms
MDC IDC STAT BRADY AP VP PERCENT: 20 %
MDC IDC STAT BRADY AS VS PERCENT: 4 %

## 2015-01-21 NOTE — Progress Notes (Signed)
PPM check in office. 

## 2015-01-22 ENCOUNTER — Telehealth: Payer: Self-pay | Admitting: Cardiovascular Disease

## 2015-01-22 ENCOUNTER — Other Ambulatory Visit: Payer: Self-pay | Admitting: *Deleted

## 2015-01-22 MED ORDER — HYDROCHLOROTHIAZIDE 25 MG PO TABS: 25.0000 mg | ORAL_TABLET | Freq: Every day | ORAL | Status: DC

## 2015-01-22 NOTE — Telephone Encounter (Signed)
°  1. Which medications need to be refilled? Hydrochlorothiazide Patient takes 25 mg daily    2. Which pharmacy is medication [facility]   3. Do they need a 30 day or 90 day supply? 90    4. Would they like a call back once the medication has been sent to the pharmacy? No Pharmacy will Call when ready

## 2015-01-30 ENCOUNTER — Encounter: Payer: Self-pay | Admitting: Neurology

## 2015-01-30 ENCOUNTER — Ambulatory Visit (INDEPENDENT_AMBULATORY_CARE_PROVIDER_SITE_OTHER): Payer: Medicare Other | Admitting: Neurology

## 2015-01-30 VITALS — BP 121/72 | HR 92 | Temp 96.9°F | Ht 71.0 in | Wt 203.0 lb

## 2015-01-30 DIAGNOSIS — Z96651 Presence of right artificial knee joint: Secondary | ICD-10-CM | POA: Diagnosis not present

## 2015-01-30 DIAGNOSIS — M4806 Spinal stenosis, lumbar region: Secondary | ICD-10-CM | POA: Diagnosis not present

## 2015-01-30 DIAGNOSIS — R531 Weakness: Secondary | ICD-10-CM | POA: Diagnosis not present

## 2015-01-30 DIAGNOSIS — I82401 Acute embolism and thrombosis of unspecified deep veins of right lower extremity: Secondary | ICD-10-CM | POA: Diagnosis not present

## 2015-01-30 DIAGNOSIS — M7989 Other specified soft tissue disorders: Secondary | ICD-10-CM

## 2015-01-30 DIAGNOSIS — R269 Unspecified abnormalities of gait and mobility: Secondary | ICD-10-CM

## 2015-01-30 DIAGNOSIS — M25552 Pain in left hip: Secondary | ICD-10-CM | POA: Diagnosis not present

## 2015-01-30 DIAGNOSIS — M48061 Spinal stenosis, lumbar region without neurogenic claudication: Secondary | ICD-10-CM

## 2015-01-30 DIAGNOSIS — Z96652 Presence of left artificial knee joint: Secondary | ICD-10-CM | POA: Diagnosis not present

## 2015-01-30 NOTE — Progress Notes (Signed)
Subjective:    Patient ID: Randy [name] is a 81 y.o. male.  HPI     Interim history:   Randy [name] is a 81 year old right-handed gentleman with an underlying medical history of complete heart block, status post pacemaker placement, arthritis, hypertension, depression, anxiety, memory loss, and peripheral neuropathy, who presents for followup consultation of his lower extremity weakness, neuropathy and parkinsonism. He also has a history of spinal stenosis and was not deemed a surgical candidate. He has significant issues with arthritis in multiple joints and has had multiple surgeries. He is accompanied by his wife again today. I last saw him on 09/29/2014, at which time his wife reported that they had moved to a cottage at the [facility] in [place] and this had been a big transition for both of them. His orthopedic doctor had him on pain medication. He was using his walker with seat as well as a smaller 3 wheeled walker for appointments. He had not recently fallen but had left hip and knee pain. I did not change his medications.   Today, his wife reports, he was diagnosed with a blood clot in the L thigh area in November and was admitted to [facility] on 10/15/14 and was on Heparin 5 days and now on Xarelto. He has thankfully not fallen since his move. He has not had success with ESI into back or hips. He had some aquatherapy, which was good, but he could not continue to do that at leisure because of his recent clot diagnosis he also needed help with dressing and it was not always conducive to have the dressing room for himself. He has had no recent bruising or side effects from Xarelto.  I saw him on 04/25/2014, at which time his wife reported that he had seen a spine specialist and had an epidural injection which helped. He had undergone the pneumonia shot and developed chills. Speech therapy had helped. He also finished aqua therapy which helped. He was supposed to see a urologist.  I provided him with a prescription for rolling walker. I did not make any medication changes at the time and felt that he was not a good candidate for Sinemet trial.  I saw him on 02/05/2014, at which time I felt he had mild parkinsonism, history of mild RBD, and gait disorder in the context of neuropathy and osteoarthritis affecting multiple joints, spinal stenosis and status post multiple surgeries. I encouraged him to increase his water intake and did not suggest any new medications. In the past, Dr. Erling Cruz felt he did not have Parkinson's disease. He was supposed to see Dr. Edger House at the [place] [facility] in March 2015. He reported being on Pristiq. We talked about a referral to urology for his nocturia and his urinary incontinence but he requested to hold off.   I saw him on 10/22/2013 for a sooner than scheduled appointment due to worsening memory and worsening tremors and weaker speech, at which time I felt he had a history consistent with RBD, I noted mild memory impairment in the realm of mild cognitive impairment and mild parkinsonism for which I did not initiate any medication. I referred him to [place]. He did not get scheduled until March.   I saw him on 08/05/2013, at which time I did not make any medication changes. I did note some mild left-sided parkinsonism. He was in the process of closing up a pond and while working on this, he slid into the hole and  bruised his L shoulder and the L wrist. He was seen by his orthopedic surgeon for his L hip, but was advised against surgery. He has had dream enactments for the past 4 years. It happens about 1/week.   I first met him on 02/05/2013 and he previously followed with Dr. Morene Antu. He has a history of bilateral lower extremity weakness. He is status post multiple knee surgeries in 1974, 1979, 1996, and status post knee replacement surgeries in 1997, status post right total hip replacement surgery in 2009, who has had over the past several years  weakness and soreness in his proximal lower extremities, going on for at least 5 years. His weakness has not been very progressive. MRI lumbar spine in March 2009 showed bilateral neural foraminal narrowing at L5-S1 and possible nerve root compromise on the left at L4-5. EMG and nerve conduction studies on 11/09/2010 was unremarkable except for slightly prolonged at wave latencies. There was no evidence of myopathy, he had workup including serum electrophoresis, TSH, ESR, CK, B12 all normal in November 2011. 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 Erling Cruz in July 2013 at which time Dr. Erling Cruz suggested a one year followup because the patient was deemed stable. The patient presented for a sooner than scheduled followup on 02/05/2013 as he had some medication changes. He had stopped Pristiq for the second time but after 3 months realize that his anxiety and depression flared up and restarted it in January. He has had a change in his blood pressure medication and reported that he may need shoulder surgery. He has had tremors which have been stable.    His Past Medical History Is Significant For: Past Medical History  Diagnosis Date  . Hypertension   . Depression   . Anxiety   . Tremor   . Memory deficit 03/24/2011  . Pacemaker   . Shortness of breath   . Complete heart block   . Blood transfusion     hx of autologus transfusion  . Neuromuscular disorder     peripheral  neuropathy  . Arthritis   . Parkinson's disease   . Parkinsonism 10/22/2013  . MCI (mild cognitive impairment) 10/22/2013    His Past Surgical History Is Significant For: Past Surgical History  Procedure Laterality Date  . Knee surgery  [phone]  . Replacement total knee bilateral  1997  . Umbilical hernia repair  2001  . Cataract extraction  06/2005  . Rotator cuff repair  06/2008  . Total hip arthroplasty  2009    right  . Defibrilator  04/04/2012    Dual Chamber  . Insert /  replace / remove pacemaker  04/04/2012  . Permanent pacemaker insertion N/A 04/04/2012    Procedure: PERMANENT PACEMAKER INSERTION;  Surgeon: Evans Lance, MD;  Location: [facility] CATH LAB;  Service: Cardiovascular;  Laterality: N/A;  . Lead revision N/A 04/05/2012    Procedure: LEAD REVISION;  Surgeon: Deboraha Sprang, MD;  Location: [facility] CATH LAB;  Service: Cardiovascular;  Laterality: N/A;    His Family History Is Significant For: Family History  Problem Relation Age of Onset  . Hypertension Mother   . Hypertension Father   . Seizures Father     epilepsy  . Prostate cancer Other   . Cancer Other   . Hypertension Other   . Asthma Other     His Social History Is Significant For: History   Social History  . Marital Status: Married  Spouse Name: Polly  . Number of Children: 3  . Years of Education: College   Occupational History  . Retired    Social History Main Topics  . Smoking status: Former Smoker -- 1.00 packs/day for 18 years    Types: Cigarettes    Quit date: 03/20/1973  . Smokeless tobacco: Never Used  . Alcohol Use: Yes     Comment: occasional  . Drug Use: No  . Sexual Activity: Not Currently   Other Topics Concern  . None   Social History Narrative   Patient lives at home with spouse.   Caffeine Use: 2 cups of coffee daily    His Allergies Are:  Allergies  Allergen Reactions  . Citalopram     Other reaction(s): Unknown  . Codeine Nausea And Vomiting  . Sertraline     Other reaction(s): Unknown  . Lodine [Etodolac] Rash  . Percocet [Oxycodone-Acetaminophen] Rash    Tolerates tylenol Patient is able to take this recently  :   His Current Medications Are:  Outpatient Encounter Prescriptions as of 01/30/2015  Medication Sig  . amoxicillin (AMOXIL) 500 MG capsule Take 500 mg by mouth as needed.   . desvenlafaxine (PRISTIQ) 50 MG 24 hr tablet Take 50 mg by mouth every other day.   . hydrochlorothiazide (HYDRODIURIL) 25 MG tablet Take 1 tablet (25 mg  total) by mouth daily.  Marland Kitchen lisinopril (PRINIVIL,ZESTRIL) 40 MG tablet Take 1 tablet (40 mg total) by mouth daily.  Marland Kitchen oxyCODONE-acetaminophen (PERCOCET/ROXICET) 5-325 MG per tablet As needed  . rivaroxaban (XARELTO) 20 MG TABS tablet Take 20 mg by mouth daily with supper.  . tamsulosin (FLOMAX) 0.4 MG CAPS capsule Take 0.4 mg by mouth.  . VOLTAREN 1 % GEL   . [DISCONTINUED] acetaminophen-codeine (TYLENOL #3) 300-30 MG per tablet Take 2 tablets by mouth as needed.   . [DISCONTINUED] aspirin 81 MG tablet Take 81 mg by mouth daily.  :  Review of Systems:  Out of a complete 14 point review of systems, all are reviewed and negative with the exception of these symptoms as listed below:   Review of Systems  Genitourinary: Positive for frequency.  Musculoskeletal: Positive for gait problem.  All other systems reviewed and are negative.   Objective:  Neurologic Exam  Physical Exam Physical Examination:   Filed Vitals:   01/30/15 0923  BP: 121/72  Pulse: 92  Temp: 96.9 F (36.1 C)     General Examination: The patient is a very pleasant 81 y.o. male in no acute distress. He appears well-developed and well-nourished and very well groomed. He is more frail-appearing today.  HEENT exam: Normocephalic, atraumatic, pupils are equal, round and reactive to light and accommodation. Extraocular tracking is shows mild saccadic breakdown without nystagmus. Hearing is mildly impaired. Neck is mildly rigid with full range of motion. He has no lip, neck or jaw tremor. Oropharynx examination reveals a mild to moderately tight airway primarily secondary to enlarged uvula which also is elongated and reaches far back down. He has mild to moderate mouth dryness. Face is symmetric with perhaps mild facial masking. Palate elevates symmetrically and tongue protrudes centrally. Speech is mildly hypophonic. He has a slight degree of raspiness in his voice. He has no dysarthria. He has a Mallampati class II.   Chest  is clear to auscultation, heart sounds are normal without murmurs, rubs or gallops, abdomen is soft, with normal bowel sounds.   He has 2-3+ pitting edema in both lower extremities. This has worsened  from last time. He is supposed to wear compression stockings.   Musculoskeletal: he has pain in the R SI joint, in the the groins bilateral and L knee and his L hip. He has bilateral knee surgery scars.  Neurologically: Mental status: The patient is awake, alert and oriented in all 4 spheres. He is able to give a good history. His memory, attention, language and knowledge are mildly impaired. He is oriented to time, place, self, circumstance, season, reason for visit.  On 09/29/2014: MMSE: 28/30, CDT: 3/4, AFT: 18  Cranial nerves II-XII are as described under HEENT exam. Motor exam: He does have a mildly decreased bulk in both thighs. Otherwise bulk, tone and strength is normal with the exception of mild hip flexor weakness bilaterally, L more than R. He also reports left hip pain. Reflexes are 1+ in the upper extremities, trace in both knees and absent in both ankles. Cerebellar testing shows no dysmetria or intention tremor. He has no resting tremor. He does have a mild degree of postural tremor in the left upper extremity and a minimal degree of postural tremor in the right upper extremity, unchanged. The same is true for action tremor which is mild on the left and minimal on the right. Finger taps are mildly impaired bilaterally and rapid alternating patting is fairly appropriate. Foot agility is more impaired bilaterally. This is in part secondary to the significant swelling. He does have decreased sensation in the lower extremities but it is hard to gauge this because of the swelling. Upper extremity sensory testing is fine. He stands very slowly and has difficulty. He has to push himself up. He does not wish his knees through. His knees almost seemed like they gave out, left more than right. He walks with  significant more difficulty and has a significant limp and his knees bent each time he takes amildlystep, this is worse on the left. I offered him a wheelchair to go back out in the lobby but he declined repeatedly stating that he will be able to do it. His wife indicated that he needs the exercise.  Assessment and Plan:    In summary, Mr. Guilmette is a very pleasant 81 year old gentleman with a history of gait disorder, mild weakness, neuropathy, in the context of an underlying history of significant osteoarthritis affecting multiple joints, lumbar spinal stenosis and s/p multiple joint related surgeries and joint replacement surgeries, status post pacemaker placement for complete heart block. He has  had mild parkinsonism but no telltale findings consistent with Parkinson's disease. His biggest issue has always been his arthritic pain and arthritis related issues resulting in gait changes. Now of course he also had a setback with the blood clot and significant lower extremity swelling. He is encouraged to use compression hose. He is encouraged to continue to try to be mobile. Unfortunately he did not have much in the way of success with epidural steroid injections and he also had steroid injection into his right hip without much success. His gait is clearly worse today. He is encouraged to always use his walker but also with caution.  I would like to see him back in about 4 months for recheck. I did not suggest any new medications. I asked him to stay well-hydrated. I answered all their questions today and the patient and his wife were in agreement. I spent 40 min in total face-to-face time with the patient, more 50% of which was spent in counseling and coordination of care, reviewing test results, reviewing  medication and reviewing the diagnosis of Gait disorder, its prognosis and treatment options.

## 2015-01-30 NOTE — Patient Instructions (Signed)
Please try to continue to walk regularly, drink about 6 glasses of water. I will see you back in 4-5 months.

## 2015-02-10 ENCOUNTER — Ambulatory Visit
Admit: 2015-02-10 | Disposition: A | Discharge status: Home / Self Care | Payer: Self-pay | Attending: Hematology and Oncology | Admitting: Hematology and Oncology

## 2015-02-12 ENCOUNTER — Encounter: Payer: Self-pay | Admitting: Internal Medicine

## 2015-03-17 ENCOUNTER — Ambulatory Visit
Admit: 2015-03-17 | Disposition: A | Discharge status: Home / Self Care | Payer: Self-pay | Attending: Hematology and Oncology | Admitting: Hematology and Oncology

## 2015-03-17 LAB — CBC CANCER CENTER
Basophil #: 0.1 x10 3/mm (ref 0.0–0.1)
Basophil %: 1.1 %
Eosinophil #: 0.4 x10 3/mm (ref 0.0–0.7)
Eosinophil %: 6.8 %
HCT: 38.8 % — ABNORMAL LOW (ref 40.0–52.0)
HGB: 12.9 g/dL — ABNORMAL LOW (ref 13.0–18.0)
Lymphocyte #: 1.4 x10 3/mm (ref 1.0–3.6)
Lymphocyte %: 24.3 %
MCH: 32.4 pg (ref 26.0–34.0)
MCHC: 33.3 g/dL (ref 32.0–36.0)
MCV: 97 fL (ref 80–100)
Monocyte #: 0.4 x10 3/mm (ref 0.2–1.0)
Monocyte %: 7.2 %
Neutrophil #: 3.5 x10 3/mm (ref 1.4–6.5)
Neutrophil %: 60.6 %
Platelet: 139 x10 3/mm — ABNORMAL LOW (ref 150–440)
RBC: 3.98 10*6/uL — ABNORMAL LOW (ref 4.40–5.90)
RDW: 13.5 % (ref 11.5–14.5)
WBC: 5.7 x10 3/mm (ref 3.8–10.6)

## 2015-03-17 LAB — BASIC METABOLIC PANEL
Anion Gap: 5 — ABNORMAL LOW (ref 7–16)
BUN: 23 mg/dL — ABNORMAL HIGH
Calcium, Total: 9.3 mg/dL
Chloride: 104 mmol/L
Co2: 30 mmol/L
Creatinine: 0.93 mg/dL
EGFR (African American): >60
EGFR (Non-African Amer.): >60
Glucose: 94 mg/dL
Potassium: 4.1 mmol/L
Sodium: 139 mmol/L

## 2015-04-09 ENCOUNTER — Other Ambulatory Visit: Payer: Self-pay | Admitting: Cardiovascular Disease

## 2015-04-21 ENCOUNTER — Encounter: Payer: Self-pay | Admitting: Internal Medicine

## 2015-04-21 ENCOUNTER — Ambulatory Visit (INDEPENDENT_AMBULATORY_CARE_PROVIDER_SITE_OTHER): Payer: Medicare Other | Admitting: Internal Medicine

## 2015-04-21 VITALS — BP 130/78 | HR 60 | Ht 70.56 in | Wt 207.2 lb

## 2015-04-21 DIAGNOSIS — Z950 Presence of cardiac pacemaker: Secondary | ICD-10-CM | POA: Diagnosis not present

## 2015-04-21 DIAGNOSIS — I442 Atrioventricular block, complete: Secondary | ICD-10-CM | POA: Diagnosis not present

## 2015-04-21 LAB — CUP PACEART INCLINIC DEVICE CHECK
Battery Impedance: 323 Ohm
Battery Remaining Longevity: 98 mo
Battery Voltage: 2.79 V
Brady Statistic AP VP Percent: 27 %
Brady Statistic AP VS Percent: 0 %
Brady Statistic AS VP Percent: 70 %
Lead Channel Impedance Value: 457 Ohm
Lead Channel Pacing Threshold Amplitude: 0.625 V
Lead Channel Pacing Threshold Pulse Width: 0.4 ms
Lead Channel Setting Pacing Amplitude: 1.5 V
Lead Channel Setting Pacing Amplitude: 2 V
Lead Channel Setting Pacing Pulse Width: 0.4 ms
Lead Channel Setting Sensing Sensitivity: 4 mV
MDC IDC MSMT LEADCHNL RA PACING THRESHOLD AMPLITUDE: 0.5 V
MDC IDC MSMT LEADCHNL RA SENSING INTR AMPL: 2.8 mV — AB
MDC IDC MSMT LEADCHNL RV IMPEDANCE VALUE: 586 Ohm
MDC IDC MSMT LEADCHNL RV PACING THRESHOLD PULSEWIDTH: 0.4 ms
MDC IDC SESS DTM: 20160510124258
MDC IDC STAT BRADY AS VS PERCENT: 3 %

## 2015-04-21 NOTE — Patient Instructions (Signed)
Medication Instructions:  Your physician recommends that you continue on your current medications as directed. Please refer to the Current Medication list given to you today.   Labwork: NONE  Testing/Procedures: NONE  Follow-Up: Your physician wants you to follow-up in: 6 months with [facility]. You will receive a reminder letter in the mail two months in advance. If you don't receive a letter, please call our office to schedule the follow-up appointment.  Your physician wants you to follow-up in: 12 months with Dr. Graciela HusbandsKlein. You will receive a reminder letter in the mail two months in advance. If you don't receive a letter, please call our office to schedule the follow-up appointment.   Any Other Special Instructions Will Be Listed Below (If Applicable).

## 2015-04-21 NOTE — Progress Notes (Signed)
Patient Care Team: Jerl MinaJames Hedrick, MD as PCP - General (Family Medicine)   HPI  Randy [name] is a 81 y.o. male seen for pacemaker followup for complete heart block implanted 4/13 and complicated by micropeforation requiring lead reposition   No chest pain or sob or edema;  Exercise toleraqnce is limited  signifivantly by his knees   He has fallen twice in the shower;  He uses his shower chair sporadically  Echocardiogram 4/13 demonstrated normal LV function and mild left atrial enlargement  4/16 potassium 4.1 creatinine 0.9  Past Medical History  Diagnosis Date  . Hypertension   . Depression   . Anxiety   . Tremor   . Memory deficit 03/24/2011  . Pacemaker   . Shortness of breath   . Complete heart block   . Blood transfusion     hx of autologus transfusion  . Neuromuscular disorder     peripheral  neuropathy  . Arthritis   . Parkinson's disease   . Parkinsonism 10/22/2013  . MCI (mild cognitive impairment) 10/22/2013  . DVT (deep venous thrombosis)     Past Surgical History  Procedure Laterality Date  . Knee surgery  [id number],1979,1996  . Replacement total knee bilateral  1997  . Umbilical hernia repair  2001  . Cataract extraction  06/2005  . Rotator cuff repair  06/2008  . Total hip arthroplasty  2009    right  . Defibrilator  04/04/2012    Dual Chamber  . Insert / replace / remove pacemaker  04/04/2012  . Permanent pacemaker insertion N/A 04/04/2012    Procedure: PERMANENT PACEMAKER INSERTION;  Surgeon: Marinus MawGregg W Taylor, MD;  [facility];  Service: Cardiovascular;  Laterality: N/A;  . Lead revision N/A 04/05/2012    Procedure: LEAD REVISION;  Surgeon: Duke SalviaSteven C Klein, MD;  [facility];  Service: Cardiovascular;  Laterality: N/A;    Current Outpatient Prescriptions  Medication Sig Dispense Refill  . amoxicillin (AMOXIL) 500 MG capsule Take 500 mg by mouth as needed.     . desvenlafaxine (PRISTIQ) 50 MG 24 hr tablet Take 50 mg by  mouth every other day.     . furosemide (LASIX) 20 MG tablet Take 20 mg by mouth daily.    . hydrochlorothiazide (HYDRODIURIL) 25 MG tablet Take 1 tablet (25 mg total) by mouth daily. (Patient taking differently: Take 25 mg by mouth as needed. ) 90 tablet 3  . lisinopril (PRINIVIL,ZESTRIL) 40 MG tablet TAKE ONE TABLET BY MOUTH EVERY DAY 30 tablet 6  . oxyCODONE-acetaminophen (PERCOCET/ROXICET) 5-325 MG per tablet As needed    . rivaroxaban (XARELTO) 20 MG TABS tablet Take 20 mg by mouth daily with supper.    . tamsulosin (FLOMAX) 0.4 MG CAPS capsule Take 0.4 mg by mouth.    . VOLTAREN 1 % GEL Apply topically as needed.      No current facility-administered medications for this visit.    Allergies  Allergen Reactions  . Citalopram     Other reaction(s): Unknown  . Codeine Nausea And Vomiting  . Sertraline     Other reaction(s): Unknown  . Lodine [Etodolac] Rash  . Percocet [Oxycodone-Acetaminophen] Rash    Tolerates tylenol Patient is able to take this recently    Review of Systems negative except from HPI and PMH  Physical Exam Ht 5' 10.56" (1.792 m)  Wt 207 lb 3 oz (93.98 kg)  BMI 29.27 kg/m2  BP 130/78 mmHg  Pulse 60  Ht 5' 10.56" (1.792 m)  Wt 207 lb 3 oz (93.98 kg)  BMI 29.27 kg/m2  Well developed and well nourished in no acute distress HENT normal E scleral and icterus clear Neck Supple Clear to ausculation  Regular rate and rhythm, 2/6  murmurs no gallops or rub Soft with active bowel sounds No clubbing cyanosis  Tr Edema Alert and oriented, grossly normal motor and sensory function Skin Warm and Dry    Assessment and  Plan  Complete Heart Block stable post pacing  Pacemaker  [employer]  The patient's device was interrogated.  The information was reviewed. Mode switch was activated  Hypertension   Well controlled  Falls   we have discussed the importance of avoidance behaviours related to shower falls  I have given him the name of [place] orthopedist to  review possibility of repeat knee surgery

## 2015-04-27 ENCOUNTER — Inpatient Hospital Stay: Payer: Medicare Other | Attending: Hematology and Oncology | Admitting: Hematology and Oncology

## 2015-04-27 VITALS — BP 140/77 | HR 81 | Temp 97.3°F | Ht 70.5 in | Wt 203.3 lb

## 2015-04-27 DIAGNOSIS — G20 Parkinson's disease: Secondary | ICD-10-CM | POA: Insufficient documentation

## 2015-04-27 DIAGNOSIS — I10 Essential (primary) hypertension: Secondary | ICD-10-CM | POA: Diagnosis not present

## 2015-04-27 DIAGNOSIS — Z87891 Personal history of nicotine dependence: Secondary | ICD-10-CM | POA: Insufficient documentation

## 2015-04-27 DIAGNOSIS — G3184 Mild cognitive impairment, so stated: Secondary | ICD-10-CM | POA: Diagnosis not present

## 2015-04-27 DIAGNOSIS — Z79899 Other long term (current) drug therapy: Secondary | ICD-10-CM | POA: Diagnosis not present

## 2015-04-27 DIAGNOSIS — F329 Major depressive disorder, single episode, unspecified: Secondary | ICD-10-CM | POA: Diagnosis not present

## 2015-04-27 DIAGNOSIS — Z7901 Long term (current) use of anticoagulants: Secondary | ICD-10-CM | POA: Diagnosis not present

## 2015-04-27 DIAGNOSIS — Z86718 Personal history of other venous thrombosis and embolism: Secondary | ICD-10-CM | POA: Diagnosis not present

## 2015-04-27 DIAGNOSIS — D696 Thrombocytopenia, unspecified: Secondary | ICD-10-CM | POA: Diagnosis not present

## 2015-04-27 DIAGNOSIS — I825Y9 Chronic embolism and thrombosis of unspecified deep veins of unspecified proximal lower extremity: Secondary | ICD-10-CM | POA: Insufficient documentation

## 2015-04-27 DIAGNOSIS — Z950 Presence of cardiac pacemaker: Secondary | ICD-10-CM | POA: Insufficient documentation

## 2015-04-27 DIAGNOSIS — F419 Anxiety disorder, unspecified: Secondary | ICD-10-CM | POA: Insufficient documentation

## 2015-04-27 DIAGNOSIS — I82402 Acute embolism and thrombosis of unspecified deep veins of left lower extremity: Secondary | ICD-10-CM

## 2015-04-27 NOTE — Progress Notes (Signed)
Pt here today for follow up regarding DVT; c/o left thigh pain

## 2015-05-20 ENCOUNTER — Other Ambulatory Visit: Payer: Self-pay

## 2015-05-20 ENCOUNTER — Encounter: Payer: Self-pay | Admitting: Internal Medicine

## 2015-05-20 ENCOUNTER — Emergency Department: Payer: Medicare Other

## 2015-05-20 ENCOUNTER — Emergency Department
Admission: EM | Admit: 2015-05-20 | Discharge: 2015-05-20 | Disposition: A | Discharge status: Home / Self Care | Payer: Medicare Other | Attending: Emergency Medicine | Admitting: Emergency Medicine

## 2015-05-20 DIAGNOSIS — S3992XA Unspecified injury of lower back, initial encounter: Secondary | ICD-10-CM | POA: Insufficient documentation

## 2015-05-20 DIAGNOSIS — S61402A Unspecified open wound of left hand, initial encounter: Secondary | ICD-10-CM | POA: Insufficient documentation

## 2015-05-20 DIAGNOSIS — Y998 Other external cause status: Secondary | ICD-10-CM | POA: Insufficient documentation

## 2015-05-20 DIAGNOSIS — I10 Essential (primary) hypertension: Secondary | ICD-10-CM | POA: Diagnosis not present

## 2015-05-20 DIAGNOSIS — Z792 Long term (current) use of antibiotics: Secondary | ICD-10-CM | POA: Insufficient documentation

## 2015-05-20 DIAGNOSIS — Z87891 Personal history of nicotine dependence: Secondary | ICD-10-CM | POA: Insufficient documentation

## 2015-05-20 DIAGNOSIS — Z79899 Other long term (current) drug therapy: Secondary | ICD-10-CM | POA: Insufficient documentation

## 2015-05-20 DIAGNOSIS — Y9289 Other specified places as the place of occurrence of the external cause: Secondary | ICD-10-CM | POA: Insufficient documentation

## 2015-05-20 DIAGNOSIS — S0990XA Unspecified injury of head, initial encounter: Secondary | ICD-10-CM

## 2015-05-20 DIAGNOSIS — S0083XA Contusion of other part of head, initial encounter: Secondary | ICD-10-CM | POA: Diagnosis not present

## 2015-05-20 DIAGNOSIS — W010XXA Fall on same level from slipping, tripping and stumbling without subsequent striking against object, initial encounter: Secondary | ICD-10-CM | POA: Diagnosis not present

## 2015-05-20 DIAGNOSIS — Y9389 Activity, other specified: Secondary | ICD-10-CM | POA: Diagnosis not present

## 2015-05-20 LAB — CBC
HCT: 40.9 % (ref 40.0–52.0)
HEMOGLOBIN: 13.3 g/dL (ref 13.0–18.0)
MCH: 32.3 pg (ref 26.0–34.0)
MCHC: 32.7 g/dL (ref 32.0–36.0)
MCV: 98.9 fL (ref 80.0–100.0)
Platelets: 127 10*3/uL — ABNORMAL LOW (ref 150–440)
RBC: 4.13 MIL/uL — ABNORMAL LOW (ref 4.40–5.90)
RDW: 14 % (ref 11.5–14.5)
WBC: 13.1 10*3/uL — AB (ref 3.8–10.6)

## 2015-05-20 LAB — BASIC METABOLIC PANEL
Anion gap: 10 (ref 5–15)
BUN: 24 mg/dL — AB (ref 6–20)
CALCIUM: 9 mg/dL (ref 8.9–10.3)
CHLORIDE: 102 mmol/L (ref 101–111)
CO2: 29 mmol/L (ref 22–32)
CREATININE: 1.3 mg/dL — AB (ref 0.61–1.24)
GFR calc non Af Amer: 51 mL/min — ABNORMAL LOW (ref >60)
GFR, EST AFRICAN AMERICAN: 59 mL/min — AB (ref >60)
GLUCOSE: 131 mg/dL — AB (ref 65–99)
Potassium: 3.9 mmol/L (ref 3.5–5.1)
SODIUM: 141 mmol/L (ref 135–145)

## 2015-05-20 NOTE — ED Notes (Signed)
Pt back from CT at this time 

## 2015-05-20 NOTE — ED Provider Notes (Signed)
[facility] Emergency Department Provider Note  ____________________________________________  Time seen: Approximately 11:24 PM  I have reviewed the triage vital signs and the nursing notes.   HISTORY  Chief Complaint Fall    HPI Keltin Baird is a 81 y.o. male who went to sit, and slipped from his walker. He fell down onto his side, he did scrape the right side of his face and also bruised his left hand. This evening he felt slightly nauseated before eating dinner, and also felt a little "foggy" briefly. This is since resolved, but he comes to the ER for evaluation as he states that he does take a blood thinner Xarelto.  Denies being in pain at present. He denies any severe headache, nausea, or vomiting. He has no headache at present. He does have some aching over his right cheek where he has a slight bruise. In addition he does have some slight tenderness over the small of his back, but no numbness or tingling. No weakness in his legs except for chronic weakness.  Denies any fevers. There is no chest pain or shortness of breath. He otherwise feels well.  Patient is on Xarelto  Past Medical History  Diagnosis Date  . Hypertension   . Depression   . Anxiety   . Tremor   . Memory deficit 03/24/2011  . Pacemaker   . Shortness of breath   . Complete heart block   . Blood transfusion     hx of autologus transfusion  . Neuromuscular disorder     peripheral  neuropathy  . Arthritis   . Parkinson's disease   . Parkinsonism 10/22/2013  . MCI (mild cognitive impairment) 10/22/2013  . DVT (deep venous thrombosis)     Patient Active Problem List   Diagnosis Date Noted  . DVT, lower extremity 04/27/2015  . Nocturia more than twice per night 09/23/2014  . Parkinsonism 10/22/2013  . MCI (mild cognitive impairment) 10/22/2013  . Edema 07/26/2012  . Pacemaker-Medtronic 07/17/2012  . Chest pain at rest 04/05/2012  . Complete heart block 04/03/2012   . HTN (hypertension) 03/24/2011  . Depression 03/24/2011  . Memory deficit 03/24/2011    Past Surgical History  Procedure Laterality Date  . Knee surgery  [id number]  . Replacement total knee bilateral  1997  . Umbilical hernia repair  2001  . Cataract extraction  06/2005  . Rotator cuff repair  06/2008  . Total hip arthroplasty  2009    right  . Defibrilator  04/04/2012    Dual Chamber  . Insert / replace / remove pacemaker  04/04/2012  . Permanent pacemaker insertion N/A 04/04/2012    Procedure: PERMANENT PACEMAKER INSERTION;  Surgeon: Marinus Maw, MD;  Location: [facility] CATH LAB;  Service: Cardiovascular;  Laterality: N/A;  . Lead revision N/A 04/05/2012    Procedure: LEAD REVISION;  Surgeon: Duke Salvia, MD;  Location: [facility] CATH LAB;  Service: Cardiovascular;  Laterality: N/A;    Current Outpatient Rx  Name  Route  Sig  Dispense  Refill  . amoxicillin (AMOXIL) 500 MG capsule   Oral   Take 500 mg by mouth as needed.          . desvenlafaxine (PRISTIQ) 50 MG 24 hr tablet   Oral   Take 50 mg by mouth every other day.          . furosemide (LASIX) 20 MG tablet   Oral   Take 20 mg by mouth daily.         Marland Kitchen  hydrochlorothiazide (HYDRODIURIL) 25 MG tablet   Oral   Take 1 tablet (25 mg total) by mouth daily. Patient taking differently: Take 25 mg by mouth as needed.    90 tablet   3   . lisinopril (PRINIVIL,ZESTRIL) 40 MG tablet      TAKE ONE TABLET BY MOUTH EVERY DAY   30 tablet   6   . oxyCODONE-acetaminophen (PERCOCET/ROXICET) 5-325 MG per tablet      As needed         . rivaroxaban (XARELTO) 20 MG TABS tablet   Oral   Take 20 mg by mouth daily with supper.         . tamsulosin (FLOMAX) 0.4 MG CAPS capsule   Oral   Take 0.4 mg by mouth.         . VOLTAREN 1 % GEL   Topical   Apply topically as needed.            Dispense as written.     Allergies Citalopram; Codeine; Sertraline; Lodine; and Percocet  Family History  Problem Relation  Age of Onset  . Hypertension Mother   . Hypertension Father   . Seizures Father     epilepsy  . Prostate cancer Other   . Cancer Other   . Hypertension Other   . Asthma Other     Social History History  Substance Use Topics  . Smoking status: Former Smoker -- 1.00 packs/day for 18 years    Types: Cigarettes    Quit date: 03/20/1973  . Smokeless tobacco: Never Used  . Alcohol Use: Yes     Comment: occasional    Review of Systems Constitutional: No fever/chills Eyes: No visual changes. ENT: No sore throat. Cardiovascular: Denies chest pain. Respiratory: Denies shortness of breath. Gastrointestinal: No abdominal pain.  no vomiting.  No diarrhea.  No constipation. Genitourinary: Negative for dysuria. Musculoskeletal: See history of present illness Skin: Negative for rash. Neurological: Negative for headaches, focal weakness or numbness.  10-point ROS otherwise negative.  ____________________________________________   PHYSICAL EXAM:  VITAL SIGNS: ED Triage Vitals  Enc Vitals Group     BP 05/20/15 2156 110/63 mmHg     Pulse Rate 05/20/15 2156 83     Resp 05/20/15 2156 20     Temp 05/20/15 2156 98.5 F (36.9 C)     Temp Source 05/20/15 2156 Oral     SpO2 05/20/15 2156 97 %     Weight 05/20/15 2156 204 lb (92.534 kg)     Height 05/20/15 2156  (1.778 m)     Head Cir --      Peak Flow --      Pain Score 05/20/15 2157 0     Pain Loc --      Pain Edu? --      Excl. in GC? --     Constitutional: Alert and oriented. Well appearing and in no acute distress. Eyes: Conjunctivae are normal. PERRL. EOMI. Head: Atraumatic except for a small contusion over the right maxilla. Nose: No congestion/rhinnorhea. Mouth/Throat: Mucous membranes are moist.  Oropharynx non-erythematous. Neck: No stridor.  No cervical spine tenderness Cardiovascular: Normal rate, regular rhythm. Grossly normal heart sounds.  Good peripheral circulation. Respiratory: Normal respiratory  effort.  No retractions. Lungs CTAB. Gastrointestinal: Soft and nontender. No distention. No abdominal bruits. No CVA tenderness. Musculoskeletal: No lower extremity tenderness nor edema.  No joint effusions. No evidence of injury to the upper extremities, except for a very small skin tear  on the left dorsal hand, with no associated bony tenderness. This overlies approximately the second metacarpal. It is no bruising or injury to lower back, he doesn't mild tenderness over the left paraspinous region but no midline tenderness over the thoracic or lumbar spine. Neurologic:  Normal speech and language. No gross focal neurologic deficits are appreciated. Speech is normal. The patient does have about 4 out of 5 strength in the legs bilaterally, this is normal for him. Skin:  Skin is warm, dry and intact. No rash noted. Psychiatric: Mood and affect are normal. Speech and behavior are normal.  ____________________________________________   LABS (all labs ordered are listed, but only abnormal results are displayed)  Labs Reviewed  CBC - Abnormal; Notable for the following:    WBC 13.1 (*)    RBC 4.13 (*)    Platelets 127 (*)    All other components within normal limits  BASIC METABOLIC PANEL - Abnormal; Notable for the following:    Glucose, Bld 131 (*)    BUN 24 (*)    Creatinine, Ser 1.30 (*)    GFR calc non Af Amer 51 (*)    GFR calc Af Amer 59 (*)    All other components within normal limits   ____________________________________________  EKG  Ventricular paced, reviewed and interpreted by me. ____________________________________________  RADIOLOGY  CLINICAL DATA: Headache and transient speech difficulty earlier today.  EXAM: CT HEAD WITHOUT CONTRAST  TECHNIQUE: Contiguous axial images were obtained from the base of the skull through the vertex without intravenous contrast.  COMPARISON: None.  FINDINGS: No evidence for acute infarction, hemorrhage, mass  lesion, hydrocephalus, or extra-axial fluid. Advanced atrophy. Hypoattenuation of the white matter likely small vessel disease. No acute osseous findings. Advanced vascular calcification. Negative orbits, sinuses, and mastoids.  IMPRESSION: Atrophy and small vessel disease. No acute intracranial findings. ____________________________________________   PROCEDURES  Procedure(s) performed: None  Critical Care performed: No  ____________________________________________   INITIAL IMPRESSION / ASSESSMENT AND PLAN / ED COURSE  Pertinent labs & imaging results that were available during my care of the patient were reviewed by me and considered in my medical decision making (see chart for details).  Mechanical fall without associated acute neurologic symptoms. No cardiopulmonary symptoms. He is currently stable and well appearing. Primary concern is to rule out intracranial injury as he is on Xarelto. Neck it is of injury is low risk however. CT scan was performed does not demonstrate any bleeding. His labs appear stable. He is in no distress, he does have small contusion of left hand otherwise appears well.  His nausea and blurring of his vision have improved. At this point, I feel the patient is safe to be discharged home. He'll continue his current medicines. I discussed with him and his daughter close return precautions including those for head injury and he is quite agreeable.  He'll follow-up with his primary care physician this week, but will return to ER right away should he develop any concerning symptoms as we discussed. ____________________________________________   FINAL CLINICAL IMPRESSION(S) / ED DIAGNOSES  Final diagnoses:  Contusion of face, initial encounter  Closed head injury, initial encounter      Sharyn CreamerMark Quale, MD 05/21/15 [id number]

## 2015-05-20 NOTE — Discharge Instructions (Signed)
Head Injury  Return to the ER right away if you develop a severe headache, difficulty speaking, numbness in arm or leg, vomiting, weakness, or other new concerns or symptoms arise.  You have received a head injury. It does not appear serious at this time. Headaches and vomiting are common following head injury. It should be easy to awaken from sleeping. Sometimes it is necessary for you to stay in the emergency department for a while for observation. Sometimes admission to the hospital may be needed. After injuries such as yours, most problems occur within the first 24 hours, but side effects may occur up to 7-10 days after the injury. It is important for you to carefully monitor your condition and contact your health care provider or seek immediate medical care if there is a change in your condition. WHAT ARE THE TYPES OF HEAD INJURIES? Head injuries can be as minor as a bump. Some head injuries can be more severe. More severe head injuries include:  A jarring injury to the brain (concussion).  A bruise of the brain (contusion). This mean there is bleeding in the brain that can cause swelling.  A cracked skull (skull fracture).  Bleeding in the brain that collects, clots, and forms a bump (hematoma). WHAT CAUSES A HEAD INJURY? A serious head injury is most likely to happen to someone who is in a car wreck and is not wearing a seat belt. Other causes of major head injuries include bicycle or motorcycle accidents, sports injuries, and falls. HOW ARE HEAD INJURIES DIAGNOSED? A complete history of the event leading to the injury and your current symptoms will be helpful in diagnosing head injuries. Many times, pictures of the brain, such as CT or MRI are needed to see the extent of the injury. Often, an overnight hospital stay is necessary for observation.  WHEN SHOULD I SEEK IMMEDIATE MEDICAL CARE?  You should get help right away if:  You have confusion or drowsiness.  You feel sick to your  stomach (nauseous) or have continued, forceful vomiting.  You have dizziness or unsteadiness that is getting worse.  You have severe, continued headaches not relieved by medicine. Only take over-the-counter or prescription medicines for pain, fever, or discomfort as directed by your health care provider.  You do not have normal function of the arms or legs or are unable to walk.  You notice changes in the black spots in the center of the colored part of your eye (pupil).  You have a clear or bloody fluid coming from your nose or ears.  You have a loss of vision. During the next 24 hours after the injury, you must stay with someone who can watch you for the warning signs. This person should contact local emergency services (911 in the [place] if you have seizures, you become unconscious, or you are unable to wake up. HOW CAN I PREVENT A HEAD INJURY IN THE FUTURE? The most important factor for preventing major head injuries is avoiding motor vehicle accidents. To minimize the potential for damage to your head, it is crucial to wear seat belts while riding in motor vehicles. Wearing helmets while bike riding and playing collision sports (like football) is also helpful. Also, avoiding dangerous activities around the house will further help reduce your risk of head injury.  WHEN CAN I RETURN TO NORMAL ACTIVITIES AND ATHLETICS? You should be reevaluated by your health care provider before returning to these activities. If you have any of the following symptoms, you should  not return to activities or contact sports until 1 week after the symptoms have stopped:  Persistent headache.  Dizziness or vertigo.  Poor attention and concentration.  Confusion.  Memory problems.  Nausea or vomiting.  Fatigue or tire easily.  Irritability.  Intolerant of bright lights or loud noises.  Anxiety or depression.  Disturbed sleep. MAKE SURE YOU:   Understand these instructions.  Will watch your  condition.  Will get help right away if you are not doing well or get worse. Document Released: 11/28/2005 Document Revised: 12/03/2013 Document Reviewed: 08/05/2013 [facility] Patient Information 2015 [employer], [place]. This information is not intended to replace advice given to you by your health care provider. Make sure you discuss any questions you have with your health care provider.

## 2015-05-20 NOTE — ED Notes (Addendum)
Pt to ED from home via EMS due to fall at 1500 this afternoon. Pt was attempting to sit back in chair when didn't get back far enough. Pt states HA to right side of head, pain to small of back, tenderness to groin. Pt is on Xarelto. Pt states "I was talking to someone when I couldn't get the words out. It like i forgot the words." Pt is AAOx3. Vitals wnl at this time. Neuro status intact. Pt does have pacemaker.

## 2015-05-20 NOTE — ED Notes (Signed)
Pt to ct at this time.

## 2015-06-11 ENCOUNTER — Ambulatory Visit: Payer: Medicare Other | Admitting: Neurology

## 2015-06-25 DIAGNOSIS — G912 (Idiopathic) normal pressure hydrocephalus: Secondary | ICD-10-CM | POA: Insufficient documentation

## 2015-07-28 ENCOUNTER — Ambulatory Visit: Payer: Medicare Other | Admitting: Hematology and Oncology

## 2015-07-28 ENCOUNTER — Other Ambulatory Visit: Payer: Medicare Other

## 2015-08-13 ENCOUNTER — Inpatient Hospital Stay (HOSPITAL_BASED_OUTPATIENT_CLINIC_OR_DEPARTMENT_OTHER): Payer: Medicare Other | Admitting: Hematology and Oncology

## 2015-08-13 ENCOUNTER — Inpatient Hospital Stay: Payer: Medicare Other | Attending: Hematology and Oncology

## 2015-08-13 ENCOUNTER — Other Ambulatory Visit: Payer: Self-pay | Admitting: Hematology and Oncology

## 2015-08-13 VITALS — BP 149/93 | HR 60 | Temp 97.7°F | Ht 70.0 in | Wt 204.0 lb

## 2015-08-13 DIAGNOSIS — Z950 Presence of cardiac pacemaker: Secondary | ICD-10-CM | POA: Diagnosis not present

## 2015-08-13 DIAGNOSIS — Z7901 Long term (current) use of anticoagulants: Secondary | ICD-10-CM | POA: Diagnosis not present

## 2015-08-13 DIAGNOSIS — Z86718 Personal history of other venous thrombosis and embolism: Secondary | ICD-10-CM | POA: Insufficient documentation

## 2015-08-13 DIAGNOSIS — I10 Essential (primary) hypertension: Secondary | ICD-10-CM | POA: Diagnosis not present

## 2015-08-13 DIAGNOSIS — Z87891 Personal history of nicotine dependence: Secondary | ICD-10-CM | POA: Insufficient documentation

## 2015-08-13 DIAGNOSIS — Z79899 Other long term (current) drug therapy: Secondary | ICD-10-CM | POA: Insufficient documentation

## 2015-08-13 DIAGNOSIS — D696 Thrombocytopenia, unspecified: Secondary | ICD-10-CM | POA: Insufficient documentation

## 2015-08-13 DIAGNOSIS — G3184 Mild cognitive impairment, so stated: Secondary | ICD-10-CM | POA: Insufficient documentation

## 2015-08-13 DIAGNOSIS — G20 Parkinson's disease: Secondary | ICD-10-CM | POA: Insufficient documentation

## 2015-08-13 DIAGNOSIS — I82402 Acute embolism and thrombosis of unspecified deep veins of left lower extremity: Secondary | ICD-10-CM

## 2015-08-13 LAB — CBC WITH DIFFERENTIAL/PLATELET
Basophils Absolute: 0.1 10*3/uL (ref 0–0.1)
Basophils Relative: 1 %
Eosinophils Absolute: 0.7 10*3/uL (ref 0–0.7)
Eosinophils Relative: 10 %
HCT: 37.4 % — ABNORMAL LOW (ref 40.0–52.0)
Hemoglobin: 12.5 g/dL — ABNORMAL LOW (ref 13.0–18.0)
Lymphocytes Relative: 30 %
Lymphs Abs: 2 10*3/uL (ref 1.0–3.6)
MCH: 32.3 pg (ref 26.0–34.0)
MCHC: 33.4 g/dL (ref 32.0–36.0)
MCV: 96.8 fL (ref 80.0–100.0)
Monocytes Absolute: 0.5 10*3/uL (ref 0.2–1.0)
Monocytes Relative: 7 %
Neutro Abs: 3.5 10*3/uL (ref 1.4–6.5)
Neutrophils Relative %: 52 %
Platelets: 153 10*3/uL (ref 150–440)
RBC: 3.86 MIL/uL — ABNORMAL LOW (ref 4.40–5.90)
RDW: 13.9 % (ref 11.5–14.5)
WBC: 6.7 10*3/uL (ref 3.8–10.6)

## 2015-08-13 LAB — VITAMIN B12: Vitamin B-12: 271 pg/mL (ref 180–914)

## 2015-08-13 LAB — BASIC METABOLIC PANEL
Anion gap: 2 — ABNORMAL LOW (ref 5–15)
BUN: 29 mg/dL — ABNORMAL HIGH (ref 6–20)
CO2: 29 mmol/L (ref 22–32)
Calcium: 8.7 mg/dL — ABNORMAL LOW (ref 8.9–10.3)
Chloride: 106 mmol/L (ref 101–111)
Creatinine, Ser: 1.03 mg/dL (ref 0.61–1.24)
GFR calc Af Amer: >60 mL/min (ref >60)
GFR calc non Af Amer: >60 mL/min (ref >60)
Glucose, Bld: 98 mg/dL (ref 65–99)
Potassium: 4.5 mmol/L (ref 3.5–5.1)
Sodium: 137 mmol/L (ref 135–145)

## 2015-08-13 LAB — FIBRIN DERIVATIVES D-DIMER (ARMC ONLY): Fibrin derivatives D-dimer (ARMC): 707.06 — ABNORMAL HIGH (ref 0–499)

## 2015-08-13 LAB — FOLATE: Folate: 15.9 ng/mL (ref >5.9)

## 2015-08-13 NOTE — Progress Notes (Signed)
Patient here for follow up for DVT procedure scheduled at [facility] needs consult regarding Xarelto.

## 2015-08-13 NOTE — Progress Notes (Signed)
[facility]  Clinic day:  04/27/2015  Chief Complaint: Randy [name] is a 81 y.o. male with a history of left lower extremity deep venous thrombosis (DVT) who is seen for 1 month assessment on Xarelto.  HPI: The patient was last seen in the medical oncology clinic on 03/17/2015.  At that time, a limited hypercoagulable work-up was performed.  Factor V Leiden, prothrombin gene mutation, lupus anticoagulant, and anticardiolipin antibodies were normal.  He had no evidence of a myeloproliferative disorder.  CBC revealed a hematocrit of 38.8, hemoglobin 12.9, MCV 97, platelets 139,000, WBC 5700 with an ANC of 3500.  At last visit, his mobility was extremely limited.  He was working with physical therapy.  He had new/increased left lower extremity edema.  Duplex study revealed no clot progression.  Decision was made to continue Xarelto.  During the interim, he notes "life is tough".  He states that he hurts in his thigh, hip, and a spot on his back. He is "hardly using my legs".  He has appointment with a [facility] physician to assess for muscular dystrophy on 06/10/2015. He states that he saw another physician and was diagnosed with Parkinson's.  He is working out in the pool once a week. Some days he is able to walk to the cul-de-sac and back (1 block).  Sometimes it is too painful.  He is still able to take a shower.  He can sit in a chair and not move for more than 2 hours. He has fallen a couple of times. He is been dehydrated. He states that if his wife doesn't put it in front of him he won't drink.  He is taking Xarelto once a day. He denies any bruising or bleeding.  Past Medical History  Diagnosis Date  . Hypertension   . Depression   . Anxiety   . Tremor   . Memory deficit 03/24/2011  . Pacemaker   . Shortness of breath   . Complete heart block   . Blood transfusion     hx of autologus transfusion  . Neuromuscular disorder    peripheral  neuropathy  . Arthritis   . Parkinson's disease   . Parkinsonism 10/22/2013  . MCI (mild cognitive impairment) 10/22/2013  . DVT (deep venous thrombosis)     Past Surgical History  Procedure Laterality Date  . Knee surgery  [id number]  . Replacement total knee bilateral  1997  . Umbilical hernia repair  2001  . Cataract extraction  06/2005  . Rotator cuff repair  06/2008  . Total hip arthroplasty  2009    right  . Defibrilator  04/04/2012    Dual Chamber  . Insert / replace / remove pacemaker  04/04/2012  . Permanent pacemaker insertion N/A 04/04/2012    Procedure: PERMANENT PACEMAKER INSERTION;  Surgeon: Marinus Maw, MD;  Location: [facility] CATH LAB;  Service: Cardiovascular;  Laterality: N/A;  . Lead revision N/A 04/05/2012    Procedure: LEAD REVISION;  Surgeon: Duke Salvia, MD;  Location: [facility] CATH LAB;  Service: Cardiovascular;  Laterality: N/A;    Family History  Problem Relation Age of Onset  . Hypertension Mother   . Hypertension Father   . Seizures Father     epilepsy  . Prostate cancer Other   . Cancer Other   . Hypertension Other   . Asthma Other     Social History:  reports that he quit smoking about 42 years ago. His smoking use  included Cigarettes. He has a 18 pack-year smoking history. He has never used smokeless tobacco. He reports that he drinks alcohol. He reports that he does not use illicit drugs.  The patient is accompanied by his wife today.  Allergies:  Allergies  Allergen Reactions  . Citalopram     Other reaction(s): Unknown  . Codeine Nausea And Vomiting  . Sertraline     Other reaction(s): Unknown  . Lodine [Etodolac] Rash  . Percocet [Oxycodone-Acetaminophen] Rash    Tolerates tylenol Patient is able to take this recently    Current Medications: Current Outpatient Prescriptions  Medication Sig Dispense Refill  . amoxicillin (AMOXIL) 500 MG capsule Take 500 mg by mouth as needed.     . desvenlafaxine (PRISTIQ) 50 MG 24 hr  tablet Take 50 mg by mouth every other day.     . furosemide (LASIX) 20 MG tablet Take 20 mg by mouth daily.    . hydrochlorothiazide (HYDRODIURIL) 25 MG tablet Take 1 tablet (25 mg total) by mouth daily. (Patient taking differently: Take 25 mg by mouth as needed. ) 90 tablet 3  . lisinopril (PRINIVIL,ZESTRIL) 40 MG tablet TAKE ONE TABLET BY MOUTH EVERY DAY 30 tablet 6  . oxyCODONE-acetaminophen (PERCOCET/ROXICET) 5-325 MG per tablet As needed    . rivaroxaban (XARELTO) 20 MG TABS tablet Take 20 mg by mouth daily with supper.    . tamsulosin (FLOMAX) 0.4 MG CAPS capsule Take 0.4 mg by mouth.    . VOLTAREN 1 % GEL Apply topically as needed.      No current facility-administered medications for this visit.    Review of Systems:  GENERAL:  Fatigue.  Minimal activity.  No fevers, sweats or weight loss. PERFORMANCE STATUS (ECOG):  2 HEENT:  No visual changes, runny nose, sore throat, mouth sores or tenderness. Lungs: No shortness of breath or cough.  No hemoptysis. Cardiac:  No chest pain, palpitations, orthopnea, or PND. GI:  No nausea, vomiting, diarrhea, constipation, melena or hematochezia. GU:  No urgency, frequency, dysuria, or hematuria. Musculoskeletal:  Discomfort in thigh, hip and a spot on back.  No muscle tenderness. Extremities:  No pain or swelling. Skin:  No rashes or skin changes. Neuro:  Progressive weakness.  No headache, numbness, balance or coordination issues. Endocrine:  No diabetes, thyroid issues, hot flashes or night sweats. Psych:  No mood changes, depression or anxiety. Pain:  No focal pain. Review of systems:  All other systems reviewed and found to be negative.  Physical Exam: Blood pressure 140/77, pulse 81, temperature 97.3 F (36.3 C), temperature source Tympanic, height 5' 10.5" (1.791 m), weight 203 lb 4.2 oz (92.2 kg). GENERAL:  Well developed, well nourished, sitting comfortably in the exam room in no acute distress.  He is slow to move to the exam  table. MENTAL STATUS:  Alert and oriented to person, place and time. HEAD:  Wallace Cullens hair.  Normocephalic, atraumatic, face symmetric, no Cushingoid features. EYES:  Glasses.  Blue eyes.  Pupils equal round and reactive to light and accomodation.  No conjunctivitis or scleral icterus. ENT:  Oropharynx clear without lesion.  Tongue normal. Mucous membranes moist.  RESPIRATORY:  Clear to auscultation without rales, wheezes or rhonchi. CARDIOVASCULAR:  Regular rate and rhythm without murmur, rub or gallop. ABDOMEN:  Soft, non-tender, with active bowel sounds, and no hepatosplenomegaly.  No masses. SKIN:  No rashes, ulcers or lesions. EXTREMITIES: No edema, no skin discoloration or tenderness.  No palpable cords. LYMPH NODES: No palpable cervical, supraclavicular, axillary  or inguinal adenopathy  PSYCH:  Appropriate.  No visits with results within 3 Day(s) from this visit. Latest known visit with results is:  Office Visit on 04/21/2015  Component Date Value Ref Range Status  . Date Time Interrogation Session 04/21/2015 [id number]   Preliminary  . Pulse Generator Manufacturer 04/21/2015 MERM   Preliminary  . Pulse Gen Model 04/21/2015 [id number] Sensia   Preliminary  . Pulse Gen Serial Number 04/21/2015 [id number]   Preliminary  . Implantable Pulse Generator Type 04/21/2015 Implantable Pulse Generator   Preliminary  . Implantable Pulse Generator Implan* 04/21/2015 [id number]   Preliminary  . Lead Channel Setting Sensing Sensi* 04/21/2015 4   Preliminary  . Lead Channel Setting Pacing Amplit* 04/21/2015 1.5   Preliminary  . Lead Channel Setting Pacing Pulse * 04/21/2015 0.4   Preliminary  . Lead Channel Setting Pacing Amplit* 04/21/2015 2   Preliminary  . Lead Channel Impedance Value 04/21/2015 457   Preliminary  . Lead Channel Sensing Intrinsic Amp* 04/21/2015 2.8*  Preliminary  . Lead Channel Pacing Threshold Ampl* 04/21/2015 0.5   Preliminary  . Lead Channel Pacing Threshold Puls*  04/21/2015 0.4   Preliminary  . Lead Channel Impedance Value 04/21/2015 586   Preliminary  . Lead Channel Pacing Threshold Ampl* 04/21/2015 0.625   Preliminary  . Lead Channel Pacing Threshold Puls* 04/21/2015 0.4   Preliminary  . Battery Status 04/21/2015 Unknown   Preliminary  . Battery Remaining Longevity 04/21/2015 98   Preliminary  . Battery Voltage 04/21/2015 2.79   Preliminary  . Battery Impedance 04/21/2015 323   Preliminary  . Huston Foley Statistic AP VP Percent 04/21/2015 27   Preliminary  . Huston Foley Statistic AS VP Percent 04/21/2015 70   Preliminary  . Huston Foley Statistic AP VS Percent 04/21/2015 0   Preliminary  . Huston Foley Statistic AS VS Percent 04/21/2015 3   Preliminary  . Eval Rhythm 04/21/2015 As-Vp & Ap-Vp w/ PACs   Preliminary    Assessment:  Deivi Huckins is a 81 y.o. male with a history of left lower extremity DVT in the setting of immobility and possible dehydration in 09/2014.  He has thrombocytopenia possibly related to Mebetiq (?).  Platelet count ranges between 141,000 and 172,000.  Work-up on 03/17/2015 revealed the following normal studies:  Factor V Leiden, prothrombin gene mutation, lupus anticoagulant, and anticardiolipin antibodies.  He has no evidence of a myeloproliferative disorder.    Left lower extremity duplex on 03/17/2015 revealed interval improvement in the left femoral popliteal DVT.  Thrombus was nonocclusive.    Symptomatically, his mobility is extremely limited. He is scheduled to have a work-up at [facility] to assess for muscular dystrophy.  He is on Xarelto 20 mg a day. He denies any bruising or bleeding.  Plan: 1. Continue Xarelto. 2. RTC in 3 months for MD assessment and labs (CBC with diff, BMP, D-dimer).   Rosey Bath, MD  04/27/2015

## 2015-08-13 NOTE — Progress Notes (Signed)
[facility] day:  08/13/2015   Chief Complaint: Randy [name] is a 81 y.o. male with a history of left lower extremity deep venous thrombosis (DVT) who is seen for 3 month assessment on Xarelto.  HPI: The patient was last seen in the medical oncology clinic on 04/27/2015.  At that time, decision was made to continue Xarelto given his limited mobility.  During the interim, his wife states that he fell on 05/20/2015 secondary to dehydration and a UTI.  Head CT at [facility] revealed atrophy and small vessel disease with no acute intracranial findings.  He has seen a neuromuscular specialist.  There is concern for possible normal pressure hydrocephalus. Patient's wife states that a spinal tap is planned and possible shunt.  Patient's  wife states that his ability to walk as decreased. He is back in the pool.  He needs a hip replacement. He sleeps during the day in a chair and is up at night.  He denies any bruising or bleeding.  Past Medical History  Diagnosis Date  . Hypertension   . Depression   . Anxiety   . Tremor   . Memory deficit 03/24/2011  . Pacemaker   . Shortness of breath   . Complete heart block   . Blood transfusion     hx of autologus transfusion  . Neuromuscular disorder     peripheral  neuropathy  . Arthritis   . Parkinson's disease   . Parkinsonism 10/22/2013  . MCI (mild cognitive impairment) 10/22/2013  . DVT (deep venous thrombosis)     Past Surgical History  Procedure Laterality Date  . Knee surgery  [phone]  . Replacement total knee bilateral  1997  . Umbilical hernia repair  2001  . Cataract extraction  06/2005  . Rotator cuff repair  06/2008  . Total hip arthroplasty  2009    right  . Defibrilator  04/04/2012    Dual Chamber  . Insert / replace / remove pacemaker  04/04/2012  . Permanent pacemaker insertion N/A 04/04/2012    Procedure: PERMANENT PACEMAKER INSERTION;   Surgeon: Evans Lance, MD;  Location: [facility] CATH LAB;  Service: Cardiovascular;  Laterality: N/A;  . Lead revision N/A 04/05/2012    Procedure: LEAD REVISION;  Surgeon: Deboraha Sprang, MD;  Location: [facility] CATH LAB;  Service: Cardiovascular;  Laterality: N/A;    Family History  Problem Relation Age of Onset  . Hypertension Mother   . Hypertension Father   . Seizures Father     epilepsy  . Prostate cancer Other   . Cancer Other   . Hypertension Other   . Asthma Other     Social History:  reports that he quit smoking about 42 years ago. His smoking use included Cigarettes. He has a 18 pack-year smoking history. He has never used smokeless tobacco. He reports that he drinks alcohol. He reports that he does not use illicit drugs.  The patient is accompanied by his wife today.  Allergies:  Allergies  Allergen Reactions  . Citalopram     Other reaction(s): Unknown  . Codeine Nausea And Vomiting  . Sertraline     Other reaction(s): Unknown  . Lodine [Etodolac] Rash  . Percocet [Oxycodone-Acetaminophen] Rash    Tolerates tylenol Patient is able to take this recently    Current Medications: Current Outpatient Prescriptions  Medication Sig Dispense Refill  . amoxicillin (AMOXIL) 500 MG capsule Take 500 mg by mouth as  needed.     . desvenlafaxine (PRISTIQ) 50 MG 24 hr tablet Take 50 mg by mouth every other day.     . hydrochlorothiazide (HYDRODIURIL) 25 MG tablet Take 1 tablet (25 mg total) by mouth daily. (Patient taking differently: Take 25 mg by mouth as needed. ) 90 tablet 3  . lisinopril (PRINIVIL,ZESTRIL) 40 MG tablet TAKE ONE TABLET BY MOUTH EVERY DAY 30 tablet 6  . oxyCODONE-acetaminophen (PERCOCET/ROXICET) 5-325 MG per tablet As needed    . rivaroxaban (XARELTO) 20 MG TABS tablet Take 20 mg by mouth daily with supper.    . tamsulosin (FLOMAX) 0.4 MG CAPS capsule Take 0.4 mg by mouth.    . VOLTAREN 1 % GEL Apply topically as needed.     . furosemide (LASIX) 20 MG tablet Take 20 mg  by mouth daily.     No current facility-administered medications for this visit.    Review of Systems:  GENERAL:  Limited activity.  No fevers, sweats or weight loss. PERFORMANCE STATUS (ECOG):  3 HEENT:  No visual changes, runny nose, sore throat, mouth sores or tenderness. Lungs: No shortness of breath or cough.  No hemoptysis. Cardiac:  No chest pain, palpitations, orthopnea, or PND. GI:  No nausea, vomiting, diarrhea, constipation, melena or hematochezia. GU:  No urgency, frequency, dysuria, or hematuria. Musculoskeletal:  Notes a popping sensation in his neck and knees.  Back and hip pain.  No muscle tenderness. Extremities:  No pain or swelling. Skin:  No rashes or skin changes. Neuro:  Decreased ability to walk.  No headache, numbness, balance or coordination issues. Endocrine:  No diabetes, thyroid issues, hot flashes or night sweats. Psych:  No mood changes, depression or anxiety. Pain:  No focal pain. Review of systems:  All other systems reviewed and found to be negative.  Physical Exam: Blood pressure 149/93, pulse 60, temperature 97.7 F (36.5 C), temperature source Oral, height _0  (1.778 m), weight 204 lb (92.534 kg). GENERAL:  Well developed, well nourished, sitting comfortably in a wheelchair the exam room in no acute distress. MENTAL STATUS:  Alert and oriented to person, place and time. HEAD:  White hair.  Normocephalic, atraumatic, face symmetric, no Cushingoid features. EYES:  Glasses.  Blue eyes.  Pupils equal round and reactive to light and accomodation.  No conjunctivitis or scleral icterus. ENT:  Oropharynx clear without lesion.  Tongue normal. Mucous membranes moist.  RESPIRATORY:  Clear to auscultation without rales, wheezes or rhonchi. CARDIOVASCULAR:  Regular rate and rhythm without murmur, rub or gallop. ABDOMEN:  Soft, non-tender, with active bowel sounds, and no hepatosplenomegaly.  No masses. SKIN:  No rashes, ulcers or lesions. EXTREMITIES:  Bilateral 1+ lower extremity edema.  No skin discoloration or tenderness.  No palpable cords. LYMPH NODES: No palpable cervical, supraclavicular, axillary or inguinal adenopathy  NEUROLOGICAL: Unremarkable. PSYCH:  Appropriate.  Appointment on 08/13/2015  Component Date Value Ref Range Status  . WBC 08/13/2015 6.7  3.8 - 10.6 K/uL Final  . RBC 08/13/2015 3.86* 4.40 - 5.90 MIL/uL Final  . Hemoglobin 08/13/2015 12.5* 13.0 - 18.0 g/dL Final  . HCT 08/13/2015 37.4* 40.0 - 52.0 % Final  . MCV 08/13/2015 96.8  80.0 - 100.0 fL Final  . MCH 08/13/2015 32.3  26.0 - 34.0 pg Final  . MCHC 08/13/2015 33.4  32.0 - 36.0 g/dL Final  . RDW 08/13/2015 13.9  11.5 - 14.5 % Final  . Platelets 08/13/2015 153  150 - 440 K/uL Final  . Neutrophils Relative %  08/13/2015 52   Final  . Neutro Abs 08/13/2015 3.5  1.4 - 6.5 K/uL Final  . Lymphocytes Relative 08/13/2015 30   Final  . Lymphs Abs 08/13/2015 2.0  1.0 - 3.6 K/uL Final  . Monocytes Relative 08/13/2015 7   Final  . Monocytes Absolute 08/13/2015 0.5  0.2 - 1.0 K/uL Final  . Eosinophils Relative 08/13/2015 10   Final  . Eosinophils Absolute 08/13/2015 0.7  0 - 0.7 K/uL Final  . Basophils Relative 08/13/2015 1   Final  . Basophils Absolute 08/13/2015 0.1  0 - 0.1 K/uL Final  . Sodium 08/13/2015 137  135 - 145 mmol/L Final  . Potassium 08/13/2015 4.5  3.5 - 5.1 mmol/L Final  . Chloride 08/13/2015 106  101 - 111 mmol/L Final  . CO2 08/13/2015 29  22 - 32 mmol/L Final  . Glucose, Bld 08/13/2015 98  65 - 99 mg/dL Final  . BUN 08/13/2015 29* 6 - 20 mg/dL Final  . Creatinine, Ser 08/13/2015 1.03  0.61 - 1.24 mg/dL Final  . Calcium 08/13/2015 8.7* 8.9 - 10.3 mg/dL Final  . GFR calc non Af Amer 08/13/2015 >60  >60 mL/min Final  . GFR calc Af Amer 08/13/2015 >60  >60 mL/min Final   Comment: (NOTE) The eGFR has been calculated using the CKD EPI equation. This calculation has not been validated in all clinical situations. eGFR's persistently <60 mL/min signify  possible Chronic Kidney Disease.   Georgiann Hahn gap 08/13/2015 2* 5 - 15 Final    Assessment:  Randy [name] is a 81 y.o. male with a history of left lower extremity DVT in the setting of immobility and possible dehydration in 09/2014.  He has thrombocytopenia possibly related to Mebetiq (?).  Platelet count ranges between 141,000 and 172,000.  Work-up on 03/17/2015 revealed the following normal studies:  Factor V Leiden, prothrombin gene mutation, lupus anticoagulant, and anticardiolipin antibodies.  He has no evidence of a myeloproliferative disorder.    Left lower extremity duplex on 03/17/2015 revealed interval improvement in the left femoral popliteal DVT.  Thrombus was nonocclusive.    Symptomatically, his mobility is extremely limited.  Performance status is 3.  He is being worked up for possible normal pressure hydrocephalus.  He remains on Xarelto.  Plan: 1. Labs today:  CBC with diff, BMP, B12, folate, D-dimer. 2. Discuss continuation of Xarelto. 3. Phone follow-up with clinical coordinator at [facility]:  [phone]) regarding upcoming procedures. 4. RTC in 3 months for MD assessment and labs (CBC with diff, BMP, D-dimer).  Addendum:  B12 is low nornmal.  Contact patient for MMA check to r/o B12 deficiency.   Lequita Asal, MD  08/13/2015, 11:57 AM

## 2015-08-16 ENCOUNTER — Encounter: Payer: Self-pay | Admitting: Hematology and Oncology

## 2015-08-17 ENCOUNTER — Encounter: Payer: Self-pay | Admitting: Hematology and Oncology

## 2015-08-26 ENCOUNTER — Telehealth: Payer: Self-pay | Admitting: *Deleted

## 2015-08-26 NOTE — Telephone Encounter (Signed)
Pt calling asking instead of doing the device check in the office, if he can do a remote one.  He states is it a bit hard for him to come since he has mobility problems.  Please advise.

## 2015-08-26 NOTE — Telephone Encounter (Signed)
Forward to [facility]. device

## 2015-08-26 NOTE — Telephone Encounter (Signed)
Called and left a message to call me back. This is regarding a lab appt. It appears a MMA was ordered for the day after Dr Merlene Pulling saw this pt. He apparently never came for that appt as it is still pending for 08/13/2105. Willattempt to reschedule this appt.

## 2015-08-27 NOTE — Telephone Encounter (Signed)
Informed pt wife that it is ok for pt to change in office device check to a remote check. Informed pt that remote transmission will be on same day 09-14-15. She verbalized understanding.

## 2015-08-28 ENCOUNTER — Telehealth: Payer: Self-pay | Admitting: *Deleted

## 2015-08-28 NOTE — Telephone Encounter (Signed)
Wife was never informed of an add on lab appt on 08/14/15 for a MMA. She states her husband is busy with other MD appts at this time. He is getting ready to have a spinal procedure at [facility] next week. She would prefer to delay that lab draw until his next scheduled appt in a couple of months. I will let Dr Merlene Pulling know.

## 2015-09-14 ENCOUNTER — Encounter: Payer: Medicare Other | Admitting: *Deleted

## 2015-09-14 ENCOUNTER — Telehealth: Payer: Self-pay | Admitting: Cardiology

## 2015-09-14 NOTE — Telephone Encounter (Signed)
Spoke with pt and reminded pt of remote transmission that is due today. Pt verbalized understanding.   

## 2015-09-15 ENCOUNTER — Telehealth: Payer: Self-pay

## 2015-09-15 ENCOUNTER — Encounter: Payer: Self-pay | Admitting: Cardiology

## 2015-09-15 NOTE — Telephone Encounter (Signed)
Called pt to speak with pt about coming in to have a follow up MMA to rule out vitamin b-12 def.  Pt placed wife on phone who stated that pt see's [facility] and husband stopped b-12 injections a couple months ago.  Pt's wife stated they are very, very, busy with selling their house and they are scheduled to see [place] in December and wants to see [place] then.  I told pt's wife our doctor would like a follow up lab before then, and she stated it cannot be this week there is just to much going on in their lives.  I asked her if I could have scheduling call her and they decide on a good time and pt's wife agreed to that.  MD aware and MD plans to call pt also.

## 2015-09-24 ENCOUNTER — Other Ambulatory Visit: Payer: Self-pay | Admitting: Hematology and Oncology

## 2015-09-25 ENCOUNTER — Inpatient Hospital Stay: Payer: Medicare Other | Attending: Hematology and Oncology

## 2015-09-25 DIAGNOSIS — D696 Thrombocytopenia, unspecified: Secondary | ICD-10-CM

## 2015-09-25 DIAGNOSIS — I82402 Acute embolism and thrombosis of unspecified deep veins of left lower extremity: Secondary | ICD-10-CM | POA: Insufficient documentation

## 2015-09-30 LAB — METHYLMALONIC ACID, SERUM: Methylmalonic Acid, Quantitative: 212 nmol/L (ref 0–378)

## 2015-10-02 NOTE — Telephone Encounter (Signed)
Erroneous

## 2015-11-10 ENCOUNTER — Other Ambulatory Visit: Payer: Self-pay | Admitting: Hematology and Oncology

## 2015-11-10 DIAGNOSIS — I82402 Acute embolism and thrombosis of unspecified deep veins of left lower extremity: Secondary | ICD-10-CM

## 2015-11-12 ENCOUNTER — Encounter: Payer: Self-pay | Admitting: Hematology and Oncology

## 2015-11-12 ENCOUNTER — Inpatient Hospital Stay: Payer: Medicare Other | Attending: Hematology and Oncology | Admitting: Hematology and Oncology

## 2015-11-12 VITALS — BP 151/88 | HR 68 | Temp 97.2°F | Resp 18 | Ht 70.0 in | Wt 203.0 lb

## 2015-11-12 DIAGNOSIS — G3184 Mild cognitive impairment, so stated: Secondary | ICD-10-CM | POA: Diagnosis not present

## 2015-11-12 DIAGNOSIS — Z86718 Personal history of other venous thrombosis and embolism: Secondary | ICD-10-CM | POA: Insufficient documentation

## 2015-11-12 DIAGNOSIS — F419 Anxiety disorder, unspecified: Secondary | ICD-10-CM | POA: Insufficient documentation

## 2015-11-12 DIAGNOSIS — I442 Atrioventricular block, complete: Secondary | ICD-10-CM | POA: Insufficient documentation

## 2015-11-12 DIAGNOSIS — I10 Essential (primary) hypertension: Secondary | ICD-10-CM | POA: Insufficient documentation

## 2015-11-12 DIAGNOSIS — F329 Major depressive disorder, single episode, unspecified: Secondary | ICD-10-CM | POA: Diagnosis not present

## 2015-11-12 DIAGNOSIS — Z7901 Long term (current) use of anticoagulants: Secondary | ICD-10-CM

## 2015-11-12 DIAGNOSIS — D696 Thrombocytopenia, unspecified: Secondary | ICD-10-CM | POA: Diagnosis not present

## 2015-11-12 DIAGNOSIS — Z87891 Personal history of nicotine dependence: Secondary | ICD-10-CM | POA: Insufficient documentation

## 2015-11-12 DIAGNOSIS — G20 Parkinson's disease: Secondary | ICD-10-CM | POA: Diagnosis not present

## 2015-11-12 DIAGNOSIS — I82592 Chronic embolism and thrombosis of other specified deep vein of left lower extremity: Secondary | ICD-10-CM

## 2015-11-12 DIAGNOSIS — Z79899 Other long term (current) drug therapy: Secondary | ICD-10-CM | POA: Insufficient documentation

## 2015-11-12 NOTE — Progress Notes (Signed)
[facility]  Clinic day:  11/12/2015   Chief Complaint: Randy [name] is a 81 y.o. male with a history of left lower extremity deep venous thrombosis (DVT) who is seen for 3 month assessment on Xarelto.  HPI: The patient was last seen in the medical oncology clinic on 08/13/2015.  At that time, he was doing well on Xarelto.  His ability to walk had decreased. He was back in the pool.  He needed a hip replacement.  He was sleeping during the day and be up at night.  He denied any bruising or bleeding.  Labs at last visit included D-dimer of 707 (high).  B12 was 271 (180-914).  Because B12 was low normal, an MMA was drawn to rule out B12 deficiency.  MMA was 212 (normal) on 09/25/2015 thus ruling out a B12 deficiency.  During the interim, he completed his neurology evaluation. Following a lumbar puncture, he documented no improvement. Decision was made not to proceed with surgery for normal pressure hydrocephalus.  He saw Dr. Dan HumphreysWalker who diagnosed him with Parkinson's.  He has been on a slow increase in his Parkinson's medications.  According to his wife, he has had no side effects or improvement as of yet.   He continues to have a limited mobility. [facility]. This is an out-of-pocket expense for his wife. He needs a hip replacement, but surgery does not wish to perform secondary to his Parkinson's.  He denies any bruising or bleeding on Xarelto.   Past Medical History  Diagnosis Date  . Hypertension   . Depression   . Anxiety   . Tremor   . Memory deficit 03/24/2011  . Pacemaker   . Shortness of breath   . Complete heart block   . Blood transfusion     hx of autologus transfusion  . Neuromuscular disorder     peripheral  neuropathy  . Arthritis   . Parkinson's disease   . Parkinsonism 10/22/2013  . MCI (mild cognitive impairment) 10/22/2013  . DVT (deep venous thrombosis)     Past Surgical History  Procedure  Laterality Date  . Knee surgery  [phone],[id number]  . Replacement total knee bilateral  1997  . Umbilical hernia repair  2001  . Cataract extraction  06/2005  . Rotator cuff repair  06/2008  . Total hip arthroplasty  2009    right  . Defibrilator  04/04/2012    Dual Chamber  . Insert / replace / remove pacemaker  04/04/2012  . Permanent pacemaker insertion N/A 04/04/2012    Procedure: PERMANENT PACEMAKER INSERTION;  Surgeon: Marinus MawGregg W Taylor, MD;  [facility];  Service: Cardiovascular;  Laterality: N/A;  . Lead revision N/A 04/05/2012    Procedure: LEAD REVISION;  Surgeon: Duke SalviaSteven C Klein, MD;  [facility];  Service: Cardiovascular;  Laterality: N/A;    Family History  Problem Relation Age of Onset  . Hypertension Mother   . Hypertension Father   . Seizures Father     epilepsy  . Prostate cancer Other   . Cancer Other   . Hypertension Other   . Asthma Other     Social History:  reports that he quit smoking about 42 years ago. His smoking use included Cigarettes. He has a 18 pack-year smoking history. He has never used smokeless tobacco. He reports that he drinks alcohol. He reports that he does not use illicit drugs.  The patient is accompanied  by his wife today.  Allergies:  Allergies  Allergen Reactions  . Citalopram     Other reaction(s): Unknown  . Codeine Nausea And Vomiting  . Sertraline     Other reaction(s): Unknown  . Lodine [Etodolac] Rash  . Percocet [Oxycodone-Acetaminophen] Rash    Tolerates tylenol Patient is able to take this recently    Current Medications: Current Outpatient Prescriptions  Medication Sig Dispense Refill  . carbidopa-levodopa (SINEMET) 25-100 MG tablet Take 1 tablet by mouth.    . desvenlafaxine (PRISTIQ) 50 MG 24 hr tablet Take 50 mg by mouth every other day.     Marland Kitchen desvenlafaxine (PRISTIQ) 50 MG 24 hr tablet Take 50 mg by mouth.    . furosemide (LASIX) 20 MG tablet Take 20 mg by mouth daily.    . hydrochlorothiazide  (HYDRODIURIL) 25 MG tablet Take 1 tablet (25 mg total) by mouth daily. (Patient taking differently: Take 25 mg by mouth as needed. ) 90 tablet 3  . lisinopril (PRINIVIL,ZESTRIL) 40 MG tablet TAKE ONE TABLET BY MOUTH EVERY DAY 30 tablet 6  . oxyCODONE-acetaminophen (PERCOCET/ROXICET) 5-325 MG tablet Take 1 tablet by mouth.    . rivaroxaban (XARELTO) 20 MG TABS tablet Take 20 mg by mouth daily with supper.    . tamsulosin (FLOMAX) 0.4 MG CAPS capsule Take 0.4 mg by mouth.    . VOLTAREN 1 % GEL Apply topically as needed.     . diclofenac sodium (VOLTAREN) 1 % GEL Apply 2 g topically.     No current facility-administered medications for this visit.    Review of Systems:  GENERAL:  Limited mobility.  No fevers, sweats or weight loss. PERFORMANCE STATUS (ECOG):  3 HEENT:  No visual changes, runny nose, sore throat, mouth sores or tenderness. Lungs: No shortness of breath or cough.  No hemoptysis. Cardiac:  No chest pain, palpitations, orthopnea, or PND. GI:  No nausea, vomiting, diarrhea, constipation, melena or hematochezia. GU:  No urgency, frequency, dysuria, or hematuria. Musculoskeletal:  Back and hip pain (positional).  No muscle tenderness. Extremities:  No pain or swelling. Skin:  No rashes or skin changes. Neuro:  Decreased ability to walk.  No headache, numbness, balance or coordination issues. Endocrine:  No diabetes, thyroid issues, hot flashes or night sweats. Psych:  No mood changes, depression or anxiety. Pain:  No focal pain. Review of systems:  All other systems reviewed and found to be negative.  Physical Exam: Blood pressure 151/88, pulse 68, temperature 97.2 F (36.2 C), temperature source Tympanic, resp. rate 18, height  (1.778 m), weight 203 lb (92.08 kg). GENERAL:  Well developed, well nourished, sitting comfortably in a wheelchair the exam room in no acute distress. MENTAL STATUS:  Alert and oriented to person, place and time. HEAD:  White hair.   Normocephalic, atraumatic, face symmetric, no Cushingoid features. EYES:  Glasses.  Blue eyes.  Pupils equal round and reactive to light and accomodation.  No conjunctivitis or scleral icterus. ENT:  Oropharynx clear without lesion.  Tongue normal. Mucous membranes moist.  RESPIRATORY:  Clear to auscultation without rales, wheezes or rhonchi. CARDIOVASCULAR:  Regular rate and rhythm without murmur, rub or gallop. ABDOMEN:  Soft, non-tender, with active bowel sounds, and no hepatosplenomegaly.  No masses. SKIN:  No rashes, ulcers or lesions. EXTREMITIES: Bilateral 1+ lower extremity edema.  No skin discoloration or tenderness.  No palpable cords. LYMPH NODES: No palpable cervical, supraclavicular, axillary or inguinal adenopathy  NEUROLOGICAL: Unremarkable. PSYCH:  Appropriate.  No visits with  results within 3 Day(s) from this visit. Latest known visit with results is:  Appointment on 09/25/2015  Component Date Value Ref Range Status  . Methylmalonic Acid, Quantitative 09/25/2015 212  0 - 378 nmol/L Final   Comment: (NOTE) Performed At: [facility] [address] [place], [place] [id number] Mila Homer MD YQ:[id number]     Assessment:  Dhruv Christina is a 81 y.o. male with Parkinson's and a history of left lower extremity DVT in the setting of immobility and possible dehydration in 09/2014.  He has thrombocytopenia possibly related to Mebetiq (?).  Platelet count ranges between 141,000 and 172,000.  Work-up on 03/17/2015 revealed the following normal studies:  Factor V Leiden, prothrombin gene mutation, lupus anticoagulant, and anticardiolipin antibodies.  He has no evidence of a myeloproliferative disorder.    Left lower extremity duplex on 03/17/2015 revealed interval improvement in the left femoral popliteal DVT.  Thrombus was nonocclusive.    Symptomatically, his mobility is extremely limited.  Performance status is 3.  He remains on Xarelto.  He denies any  bruising or bleeding.  Plan: 1. Labs today:  CBC with diff, BMP. 2. Review results of B12 and MMA testing.  No evidence of B12 deficiency. 3. Discuss continuation of Xarelto. 4. Discuss long term anticoagulation.  Patient wishes ongoing follow-up in clinic.  5. RTC in 3 months for MD assessment and labs (CBC with diff,CMP).   Rosey Bath, MD  11/12/2015, 12:07 PM

## 2015-11-12 NOTE — Progress Notes (Signed)
Patient is here for follow-up of DVT. Patient's wife states that patient was dx'ed with Parkinson's in November and has been started on medication for this. Patient states that his appetite has been very good. He has pain in his knees from arthritis and rates his pain a 5/10 today.

## 2015-11-15 ENCOUNTER — Emergency Department
Admission: EM | Admit: 2015-11-15 | Discharge: 2015-11-16 | Disposition: A | Discharge status: Home / Self Care | Payer: Medicare Other | Attending: Emergency Medicine | Admitting: Emergency Medicine

## 2015-11-15 ENCOUNTER — Emergency Department: Payer: Medicare Other

## 2015-11-15 ENCOUNTER — Encounter: Payer: Self-pay | Admitting: Emergency Medicine

## 2015-11-15 DIAGNOSIS — I10 Essential (primary) hypertension: Secondary | ICD-10-CM | POA: Diagnosis not present

## 2015-11-15 DIAGNOSIS — S59912A Unspecified injury of left forearm, initial encounter: Secondary | ICD-10-CM | POA: Diagnosis present

## 2015-11-15 DIAGNOSIS — F329 Major depressive disorder, single episode, unspecified: Secondary | ICD-10-CM | POA: Insufficient documentation

## 2015-11-15 DIAGNOSIS — Z79891 Long term (current) use of opiate analgesic: Secondary | ICD-10-CM | POA: Insufficient documentation

## 2015-11-15 DIAGNOSIS — Z79899 Other long term (current) drug therapy: Secondary | ICD-10-CM | POA: Diagnosis not present

## 2015-11-15 DIAGNOSIS — F419 Anxiety disorder, unspecified: Secondary | ICD-10-CM | POA: Insufficient documentation

## 2015-11-15 DIAGNOSIS — S51831A Puncture wound without foreign body of right forearm, initial encounter: Secondary | ICD-10-CM | POA: Diagnosis not present

## 2015-11-15 DIAGNOSIS — S5011XA Contusion of right forearm, initial encounter: Secondary | ICD-10-CM | POA: Insufficient documentation

## 2015-11-15 DIAGNOSIS — W01198A Fall on same level from slipping, tripping and stumbling with subsequent striking against other object, initial encounter: Secondary | ICD-10-CM | POA: Diagnosis not present

## 2015-11-15 DIAGNOSIS — Y9389 Activity, other specified: Secondary | ICD-10-CM | POA: Diagnosis not present

## 2015-11-15 DIAGNOSIS — Z791 Long term (current) use of non-steroidal anti-inflammatories (NSAID): Secondary | ICD-10-CM | POA: Insufficient documentation

## 2015-11-15 DIAGNOSIS — Z87891 Personal history of nicotine dependence: Secondary | ICD-10-CM | POA: Insufficient documentation

## 2015-11-15 DIAGNOSIS — Y998 Other external cause status: Secondary | ICD-10-CM | POA: Insufficient documentation

## 2015-11-15 DIAGNOSIS — Y9289 Other specified places as the place of occurrence of the external cause: Secondary | ICD-10-CM | POA: Diagnosis not present

## 2015-11-15 MED ORDER — ACETAMINOPHEN 325 MG PO TABS
650.0000 mg | ORAL_TABLET | Freq: Once | ORAL | Status: AC
  Administered 2015-11-15: 650 mg via ORAL
  Filled 2015-11-15: qty 2

## 2015-11-15 NOTE — ED Notes (Signed)
Wound cleaned and dressed with kling.  Bleeding controlled.  Pt alert.  Family with pt.

## 2015-11-15 NOTE — ED Notes (Signed)
Laceration to left forearm. Per pt he was trying to get out of the recliner and fell against a magazine rack. No loc, no dizziness

## 2015-11-15 NOTE — ED Provider Notes (Signed)
[facility] Emergency Department Provider Note  ____________________________________________  Time seen: Approximately 10:35 PM  I have reviewed the triage vital signs and the nursing notes.   HISTORY  Chief Complaint Extremity Laceration    HPI Randy [name] is a 81 y.o. male patient has a hematoma and small laceration to the left forearm. He stated he was trying to get out of his recliner and fell against the magazine rack. He denies any vertigo or loss of consciousness.Patient state immediate swelling to the left forearm. Patient is currently taking Xarelto. Patient denies any pain with this complaint. No palliative measures taken prior to arrival.   Past Medical History  Diagnosis Date  . Hypertension   . Depression   . Anxiety   . Tremor   . Memory deficit 03/24/2011  . Pacemaker   . Shortness of breath   . Complete heart block (HCC)   . Blood transfusion     hx of autologus transfusion  . Neuromuscular disorder (HCC)     peripheral  neuropathy  . Arthritis   . Parkinson's disease (HCC)   . Parkinsonism (HCC) 10/22/2013  . MCI (mild cognitive impairment) 10/22/2013  . DVT (deep venous thrombosis) [facility](HCC)     Patient Active Problem List   Diagnosis Date Noted  . Thrombocytopenia (HCC) 08/13/2015  . DVT, lower extremity (HCC) 04/27/2015  . Nocturia more than twice per night 09/23/2014  . DVT of lower extremity (deep venous thrombosis) (HCC) 09/11/2014  . Parkinsonism (HCC) 10/22/2013  . MCI (mild cognitive impairment) 10/22/2013  . Edema 07/26/2012  . Pacemaker-Medtronic 07/17/2012  . Chest pain at rest 04/05/2012  . Complete heart block (HCC) 04/03/2012  . HTN (hypertension) 03/24/2011  . Depression 03/24/2011  . Memory deficit 03/24/2011    Past Surgical History  Procedure Laterality Date  . Knee surgery  [phone],[id number],1996  . Replacement total knee bilateral  1997  . Umbilical hernia repair  2001  . Cataract extraction   06/2005  . Rotator cuff repair  06/2008  . Total hip arthroplasty  2009    right  . Defibrilator  04/04/2012    Dual Chamber  . Insert / replace / remove pacemaker  04/04/2012  . Permanent pacemaker insertion N/A 04/04/2012    Procedure: PERMANENT PACEMAKER INSERTION;  Surgeon: Marinus MawGregg W Taylor, MD;  [facility];  Service: Cardiovascular;  Laterality: N/A;  . Lead revision N/A 04/05/2012    Procedure: LEAD REVISION;  Surgeon: Duke SalviaSteven C Klein, MD;  [facility];  Service: Cardiovascular;  Laterality: N/A;    Current Outpatient Rx  Name  Route  Sig  Dispense  Refill  . carbidopa-levodopa (SINEMET) 25-100 MG tablet   Oral   Take 1 tablet by mouth.         . desvenlafaxine (PRISTIQ) 50 MG 24 hr tablet   Oral   Take 50 mg by mouth every other day.          Marland Kitchen. desvenlafaxine (PRISTIQ) 50 MG 24 hr tablet   Oral   Take 50 mg by mouth.         . diclofenac sodium (VOLTAREN) 1 % GEL   Topical   Apply 2 g topically.         . furosemide (LASIX) 20 MG tablet   Oral   Take 20 mg by mouth daily.         . hydrochlorothiazide (HYDRODIURIL) 25 MG tablet   Oral   Take 1 tablet (25 mg  total) by mouth daily. Patient taking differently: Take 25 mg by mouth as needed.    90 tablet   3   . lisinopril (PRINIVIL,ZESTRIL) 40 MG tablet      TAKE ONE TABLET BY MOUTH EVERY DAY   30 tablet   6   . oxyCODONE-acetaminophen (PERCOCET/ROXICET) 5-325 MG tablet   Oral   Take 1 tablet by mouth.         . rivaroxaban (XARELTO) 20 MG TABS tablet   Oral   Take 20 mg by mouth daily with supper.         . tamsulosin (FLOMAX) 0.4 MG CAPS capsule   Oral   Take 0.4 mg by mouth.         . VOLTAREN 1 % GEL   Topical   Apply topically as needed.            Dispense as written.     Allergies Citalopram; Codeine; Sertraline; Lodine; and Percocet  Family History  Problem Relation Age of Onset  . Hypertension Mother   . Hypertension Father   . Seizures Father      epilepsy  . Prostate cancer Other   . Cancer Other   . Hypertension Other   . Asthma Other     Social History Social History  Substance Use Topics  . Smoking status: Former Smoker -- 1.00 packs/day for 18 years    Types: Cigarettes    Quit date: 03/20/1973  . Smokeless tobacco: Never Used  . Alcohol Use: Yes     Comment: occasional    Review of Systems Constitutional: No fever/chills Eyes: No visual changes. ENT: No sore throat. Cardiovascular: Denies chest pain. Respiratory: Denies shortness of breath. Gastrointestinal: No abdominal pain.  No nausea, no vomiting.  No diarrhea.  No constipation. Genitourinary: Negative for dysuria. Musculoskeletal: Mild right forearm pain Skin: Negative for rash. Swelling left forearm  Neurological: Negative for headaches, focal weakness or numbness. Psychiatric:Depression and anxiety Endocrine:Hypertension Hematological/Lymphatic: Allergic/Immunilogical: 10-point ROS otherwise negative.  ____________________________________________   PHYSICAL EXAM:  VITAL SIGNS: ED Triage Vitals  Enc Vitals Group     BP --      Pulse --      Resp --      Temp --      Temp src --      SpO2 --      Weight --      Height --      Head Cir --      Peak Flow --      Pain Score --      Pain Loc --      Pain Edu? --      Excl. in GC? --     Constitutional: Alert and oriented. Well appearing and in no acute distress. Eyes: Conjunctivae are normal. PERRL. EOMI. Head: Atraumatic. Nose: No congestion/rhinnorhea. Mouth/Throat: Mucous membranes are moist.  Oropharynx non-erythematous. Neck: No stridor.  No cervical spine tenderness to palpation. Hematological/Lymphatic/Immunilogical: No cervical lymphadenopathy. Cardiovascular: Normal rate, regular rhythm. Grossly normal heart sounds.  Good peripheral circulation. Respiratory: Normal respiratory effort.  No retractions. Lungs CTAB. Gastrointestinal: Soft and nontender. No distention. No  abdominal bruits. No CVA tenderness. }Musculoskeletal: No lower extremity tenderness nor edema.  No joint effusions. Neurologic:  Normal speech and language. No gross focal neurologic deficits are appreciated. No gait instability. Skin:  Skin is warm, dry and intact. No rash noted. Puncture wound and hematoma to the upper aspect of the mid right forearm. Psychiatric:  Mood and affect are normal. Speech and behavior are normal.  ____________________________________________   LABS (all labs ordered are listed, but only abnormal results are displayed)  Labs Reviewed - No data to display ____________________________________________  EKG   ____________________________________________  RADIOLOGY  No acute findings on x-ray of the right forearm except for the hematoma. ____________________________________________   PROCEDURES  Procedure(s) performed: None  Critical Care performed: No  ____________________________________________   INITIAL IMPRESSION / ASSESSMENT AND PLAN / ED COURSE  Pertinent labs & imaging results that were available during my care of the patient were reviewed by me and considered in my medical decision making (see chart for details).  Discussed x-ray findings with patient. Patient gave consent for manual expression of the hematoma through the puncture wound. Approximately 80% reduction of hematoma with manual expression. Patient was then placed in a pressure dressing and advised follow-up family doctor in the morning. Discussed signs and symptoms of compartment syndrome and return back to ER if his condition worsens. ____________________________________________   FINAL CLINICAL IMPRESSION(S) / ED DIAGNOSES  Final diagnoses:  Traumatic hematoma of right forearm, initial encounter       Joni Reining, PA-C 11/15/15 2352  Jeanmarie Plant, MD 11/15/15 [phone]

## 2015-11-17 ENCOUNTER — Encounter
Admission: RE | Admit: 2015-11-17 | Discharge: 2015-11-17 | Disposition: A | Discharge status: Home / Self Care | Payer: Medicare Other | Source: Ambulatory Visit | Attending: Internal Medicine | Admitting: Internal Medicine

## 2015-12-07 ENCOUNTER — Other Ambulatory Visit: Payer: Self-pay | Admitting: Cardiovascular Disease

## 2015-12-12 ENCOUNTER — Other Ambulatory Visit: Payer: Self-pay | Admitting: Hematology and Oncology

## 2015-12-13 ENCOUNTER — Encounter
Admission: RE | Admit: 2015-12-13 | Discharge: 2015-12-13 | Disposition: A | Discharge status: Home / Self Care | Payer: Medicare Other | Source: Ambulatory Visit | Attending: Internal Medicine | Admitting: Internal Medicine

## 2015-12-13 DIAGNOSIS — R350 Frequency of micturition: Secondary | ICD-10-CM | POA: Insufficient documentation

## 2015-12-13 DIAGNOSIS — R410 Disorientation, unspecified: Secondary | ICD-10-CM | POA: Insufficient documentation

## 2015-12-28 ENCOUNTER — Telehealth: Payer: Self-pay

## 2015-12-28 DIAGNOSIS — R410 Disorientation, unspecified: Secondary | ICD-10-CM | POA: Diagnosis present

## 2015-12-28 DIAGNOSIS — R350 Frequency of micturition: Secondary | ICD-10-CM | POA: Diagnosis present

## 2015-12-28 LAB — URINALYSIS COMPLETE WITH MICROSCOPIC (ARMC ONLY)
Bacteria, UA: NONE SEEN
Bilirubin Urine: NEGATIVE
GLUCOSE, UA: NEGATIVE mg/dL
HGB URINE DIPSTICK: NEGATIVE
Ketones, ur: NEGATIVE mg/dL
NITRITE: NEGATIVE
PH: 6 (ref 5.0–8.0)
PROTEIN: NEGATIVE mg/dL
SPECIFIC GRAVITY, URINE: 1.015 (ref 1.005–1.030)

## 2015-12-28 NOTE — Telephone Encounter (Signed)
Pt spouse called, asks if pt needs a device check, states he had a remote check in October. Please call.

## 2015-12-28 NOTE — Telephone Encounter (Signed)
LMOVM for pt emergency contact to return call.

## 2015-12-28 NOTE — Telephone Encounter (Signed)
Spoke w/ pt emergency contact and informed her that we have not received a remote transmission since 04/2015. Pt emergency contact verbalized understanding and is aware that

## 2015-12-30 ENCOUNTER — Ambulatory Visit (INDEPENDENT_AMBULATORY_CARE_PROVIDER_SITE_OTHER): Payer: Medicare Other | Admitting: *Deleted

## 2015-12-30 DIAGNOSIS — I442 Atrioventricular block, complete: Secondary | ICD-10-CM | POA: Diagnosis not present

## 2015-12-30 LAB — URINE CULTURE

## 2016-01-01 NOTE — Progress Notes (Signed)
Remote pacemaker transmission.   

## 2016-01-05 LAB — CUP PACEART REMOTE DEVICE CHECK
Battery Remaining Longevity: 88 mo
Battery Voltage: 2.79 V
Date Time Interrogation Session: 20170118203152
Implantable Lead Implant Date: 20130424
Implantable Lead Location: 753859
Implantable Lead Location: 753860
Implantable Lead Model: 5076
Lead Channel Pacing Threshold Amplitude: 0.375 V
Lead Channel Pacing Threshold Amplitude: 0.625 V
Lead Channel Pacing Threshold Pulse Width: 0.4 ms
Lead Channel Setting Pacing Amplitude: 1.5 V
Lead Channel Setting Pacing Amplitude: 2 V
Lead Channel Setting Pacing Pulse Width: 0.4 ms
Lead Channel Setting Sensing Sensitivity: 4 mV
MDC IDC LEAD IMPLANT DT: 20130424
MDC IDC MSMT BATTERY IMPEDANCE: 422 Ohm
MDC IDC MSMT LEADCHNL RA IMPEDANCE VALUE: 462 Ohm
MDC IDC MSMT LEADCHNL RA PACING THRESHOLD PULSEWIDTH: 0.4 ms
MDC IDC MSMT LEADCHNL RA SENSING INTR AMPL: 2.8 mV
MDC IDC MSMT LEADCHNL RV IMPEDANCE VALUE: 501 Ohm
MDC IDC STAT BRADY AP VP PERCENT: 27 %
MDC IDC STAT BRADY AP VS PERCENT: 0 %
MDC IDC STAT BRADY AS VP PERCENT: 66 %
MDC IDC STAT BRADY AS VS PERCENT: 7 %

## 2016-01-08 ENCOUNTER — Encounter: Payer: Self-pay | Admitting: Cardiology

## 2016-01-08 ENCOUNTER — Other Ambulatory Visit: Payer: Self-pay | Admitting: Cardiovascular Disease

## 2016-01-13 ENCOUNTER — Encounter
Admission: RE | Admit: 2016-01-13 | Discharge: 2016-01-13 | Disposition: A | Discharge status: Home / Self Care | Payer: Medicare Other | Source: Ambulatory Visit | Attending: Internal Medicine | Admitting: Internal Medicine

## 2016-01-13 DIAGNOSIS — R410 Disorientation, unspecified: Secondary | ICD-10-CM | POA: Insufficient documentation

## 2016-01-14 DIAGNOSIS — R410 Disorientation, unspecified: Secondary | ICD-10-CM | POA: Diagnosis present

## 2016-01-14 LAB — URINALYSIS COMPLETE WITH MICROSCOPIC (ARMC ONLY)
BACTERIA UA: NONE SEEN
BILIRUBIN URINE: NEGATIVE
GLUCOSE, UA: NEGATIVE mg/dL
Hgb urine dipstick: NEGATIVE
Ketones, ur: NEGATIVE mg/dL
LEUKOCYTES UA: NEGATIVE
NITRITE: NEGATIVE
Protein, ur: NEGATIVE mg/dL
RBC / HPF: NONE SEEN RBC/hpf (ref 0–5)
SPECIFIC GRAVITY, URINE: 1.012 (ref 1.005–1.030)
Squamous Epithelial / LPF: NONE SEEN
pH: 6 (ref 5.0–8.0)

## 2016-01-16 LAB — URINE CULTURE

## 2016-01-18 DIAGNOSIS — R410 Disorientation, unspecified: Secondary | ICD-10-CM | POA: Diagnosis not present

## 2016-01-18 LAB — URINALYSIS COMPLETE WITH MICROSCOPIC (ARMC ONLY)
BILIRUBIN URINE: NEGATIVE
GLUCOSE, UA: NEGATIVE mg/dL
Ketones, ur: NEGATIVE mg/dL
NITRITE: POSITIVE — AB
PH: 6 (ref 5.0–8.0)
Protein, ur: 100 mg/dL — AB
Specific Gravity, Urine: 1.016 (ref 1.005–1.030)
Squamous Epithelial / LPF: NONE SEEN

## 2016-02-05 DIAGNOSIS — R410 Disorientation, unspecified: Secondary | ICD-10-CM | POA: Diagnosis not present

## 2016-02-05 LAB — URINALYSIS COMPLETE WITH MICROSCOPIC (ARMC ONLY)
BILIRUBIN URINE: NEGATIVE
Bacteria, UA: NONE SEEN
Glucose, UA: NEGATIVE mg/dL
HGB URINE DIPSTICK: NEGATIVE
KETONES UR: NEGATIVE mg/dL
LEUKOCYTES UA: NEGATIVE
Nitrite: NEGATIVE
PH: 6 (ref 5.0–8.0)
Protein, ur: NEGATIVE mg/dL
Specific Gravity, Urine: 1.01 (ref 1.005–1.030)

## 2016-02-07 LAB — URINE CULTURE: Culture: NO GROWTH

## 2016-02-09 ENCOUNTER — Other Ambulatory Visit: Payer: Self-pay | Admitting: Hematology and Oncology

## 2016-02-10 ENCOUNTER — Encounter
Admission: RE | Admit: 2016-02-10 | Discharge: 2016-02-10 | Disposition: A | Discharge status: Home / Self Care | Payer: Medicare Other | Source: Ambulatory Visit | Attending: Internal Medicine | Admitting: Internal Medicine

## 2016-02-10 DIAGNOSIS — Z79899 Other long term (current) drug therapy: Secondary | ICD-10-CM | POA: Insufficient documentation

## 2016-02-10 DIAGNOSIS — R888 Abnormal findings in other body fluids and substances: Secondary | ICD-10-CM | POA: Insufficient documentation

## 2016-02-11 ENCOUNTER — Inpatient Hospital Stay (HOSPITAL_BASED_OUTPATIENT_CLINIC_OR_DEPARTMENT_OTHER): Payer: Medicare Other | Admitting: Hematology and Oncology

## 2016-02-11 ENCOUNTER — Inpatient Hospital Stay: Payer: Medicare Other | Attending: Hematology and Oncology

## 2016-02-11 VITALS — BP 130/79 | HR 71 | Temp 97.7°F | Resp 18 | Ht 70.0 in

## 2016-02-11 DIAGNOSIS — F329 Major depressive disorder, single episode, unspecified: Secondary | ICD-10-CM | POA: Diagnosis not present

## 2016-02-11 DIAGNOSIS — Z86718 Personal history of other venous thrombosis and embolism: Secondary | ICD-10-CM

## 2016-02-11 DIAGNOSIS — D6852 Prothrombin gene mutation: Secondary | ICD-10-CM

## 2016-02-11 DIAGNOSIS — D6851 Activated protein C resistance: Secondary | ICD-10-CM

## 2016-02-11 DIAGNOSIS — G20 Parkinson's disease: Secondary | ICD-10-CM

## 2016-02-11 DIAGNOSIS — Z7901 Long term (current) use of anticoagulants: Secondary | ICD-10-CM | POA: Diagnosis not present

## 2016-02-11 DIAGNOSIS — I10 Essential (primary) hypertension: Secondary | ICD-10-CM | POA: Insufficient documentation

## 2016-02-11 DIAGNOSIS — F419 Anxiety disorder, unspecified: Secondary | ICD-10-CM | POA: Insufficient documentation

## 2016-02-11 DIAGNOSIS — I442 Atrioventricular block, complete: Secondary | ICD-10-CM | POA: Diagnosis not present

## 2016-02-11 DIAGNOSIS — Z87891 Personal history of nicotine dependence: Secondary | ICD-10-CM | POA: Diagnosis not present

## 2016-02-11 DIAGNOSIS — I82402 Acute embolism and thrombosis of unspecified deep veins of left lower extremity: Secondary | ICD-10-CM

## 2016-02-11 DIAGNOSIS — D649 Anemia, unspecified: Secondary | ICD-10-CM | POA: Diagnosis not present

## 2016-02-11 DIAGNOSIS — Z79899 Other long term (current) drug therapy: Secondary | ICD-10-CM | POA: Diagnosis not present

## 2016-02-11 DIAGNOSIS — R888 Abnormal findings in other body fluids and substances: Secondary | ICD-10-CM | POA: Diagnosis present

## 2016-02-11 DIAGNOSIS — M1990 Unspecified osteoarthritis, unspecified site: Secondary | ICD-10-CM | POA: Insufficient documentation

## 2016-02-11 DIAGNOSIS — Z950 Presence of cardiac pacemaker: Secondary | ICD-10-CM | POA: Diagnosis not present

## 2016-02-11 DIAGNOSIS — D696 Thrombocytopenia, unspecified: Secondary | ICD-10-CM

## 2016-02-11 DIAGNOSIS — G3184 Mild cognitive impairment, so stated: Secondary | ICD-10-CM | POA: Insufficient documentation

## 2016-02-11 LAB — CBC WITH DIFFERENTIAL/PLATELET
Basophils Absolute: 0.1 10*3/uL (ref 0–0.1)
Basophils Relative: 1 %
Eosinophils Absolute: 0.6 10*3/uL (ref 0–0.7)
Eosinophils Relative: 8 %
HCT: 36.9 % — ABNORMAL LOW (ref 40.0–52.0)
Hemoglobin: 12.4 g/dL — ABNORMAL LOW (ref 13.0–18.0)
Lymphocytes Relative: 25 %
Lymphs Abs: 1.7 10*3/uL (ref 1.0–3.6)
MCH: 32.4 pg (ref 26.0–34.0)
MCHC: 33.7 g/dL (ref 32.0–36.0)
MCV: 96 fL (ref 80.0–100.0)
Monocytes Absolute: 0.4 10*3/uL (ref 0.2–1.0)
Monocytes Relative: 6 %
Neutro Abs: 4 10*3/uL (ref 1.4–6.5)
Neutrophils Relative %: 60 %
Platelets: 151 10*3/uL (ref 150–440)
RBC: 3.84 MIL/uL — ABNORMAL LOW (ref 4.40–5.90)
RDW: 13.9 % (ref 11.5–14.5)
WBC: 6.7 10*3/uL (ref 3.8–10.6)

## 2016-02-11 LAB — CBC
HEMATOCRIT: 37.2 % — AB (ref 40.0–52.0)
HEMOGLOBIN: 12.3 g/dL — AB (ref 13.0–18.0)
MCH: 31.8 pg (ref 26.0–34.0)
MCHC: 33.1 g/dL (ref 32.0–36.0)
MCV: 96 fL (ref 80.0–100.0)
Platelets: 160 10*3/uL (ref 150–440)
RBC: 3.87 MIL/uL — AB (ref 4.40–5.90)
RDW: 14.5 % (ref 11.5–14.5)
WBC: 7 10*3/uL (ref 3.8–10.6)

## 2016-02-11 LAB — COMPREHENSIVE METABOLIC PANEL
ALBUMIN: 4 g/dL (ref 3.5–5.0)
ALK PHOS: 66 U/L (ref 38–126)
ALT: 15 U/L — AB (ref 17–63)
AST: 17 U/L (ref 15–41)
Anion gap: 7 (ref 5–15)
BUN: 17 mg/dL (ref 6–20)
CALCIUM: 9 mg/dL (ref 8.9–10.3)
CO2: 28 mmol/L (ref 22–32)
CREATININE: 0.98 mg/dL (ref 0.61–1.24)
Chloride: 104 mmol/L (ref 101–111)
GFR calc non Af Amer: >60 mL/min (ref >60)
GLUCOSE: 103 mg/dL — AB (ref 65–99)
Potassium: 4.1 mmol/L (ref 3.5–5.1)
SODIUM: 139 mmol/L (ref 135–145)
Total Bilirubin: 0.5 mg/dL (ref 0.3–1.2)
Total Protein: 6.3 g/dL — ABNORMAL LOW (ref 6.5–8.1)

## 2016-02-11 LAB — BASIC METABOLIC PANEL
Anion gap: 3 — ABNORMAL LOW (ref 5–15)
BUN: 19 mg/dL (ref 6–20)
CO2: 27 mmol/L (ref 22–32)
Calcium: 8.5 mg/dL — ABNORMAL LOW (ref 8.9–10.3)
Chloride: 102 mmol/L (ref 101–111)
Creatinine, Ser: 1.02 mg/dL (ref 0.61–1.24)
GFR calc Af Amer: >60 mL/min (ref >60)
GFR calc non Af Amer: >60 mL/min (ref >60)
Glucose, Bld: 100 mg/dL — ABNORMAL HIGH (ref 65–99)
Potassium: 3.7 mmol/L (ref 3.5–5.1)
Sodium: 132 mmol/L — ABNORMAL LOW (ref 135–145)

## 2016-02-11 LAB — FERRITIN: Ferritin: 151 ng/mL (ref 24–336)

## 2016-02-11 LAB — IRON AND TIBC
Iron: 80 ug/dL (ref 45–182)
Saturation Ratios: 31 % (ref 17.9–39.5)
TIBC: 259 ug/dL (ref 250–450)
UIBC: 179 ug/dL

## 2016-02-11 LAB — TSH: TSH: 1.188 u[IU]/mL (ref 0.350–4.500)

## 2016-02-11 LAB — FOLATE: Folate: 11.3 ng/mL (ref >5.9)

## 2016-02-11 NOTE — Progress Notes (Signed)
Patient is in assisted living at [facility] [place]. He fell in December and has been there since. He states that he is getting weaker and weaker. His appetite has been good. He does not sleep well at night, but states that he does sleep during the days, as his days and nights are mixed up.

## 2016-02-11 NOTE — Progress Notes (Signed)
[facility] day:  02/11/2016   Chief Complaint: Dimitrius Steedman is a 82 y.o. male with a history of left lower extremity deep venous thrombosis (DVT) who is seen for 3 month assessment on Xarelto.  HPI: The patient was last seen in the medical oncology clinic on 11/12/2015.  At that time, his mobility was extremely limited.  Performance status was 3.  He remained on Xarelto.  He denies any bruising or bleeding.   Labs were inadvertantly not done  During the interim, he has remained with skilled nursing at [facility].  His wife notes that he is not getting his strength back.  He is receiving physical therapy from a student from [place].  He has been in the pool a total of 2-3 times.  He is also receiving restorative therapy by the nursing staff and aides.    His wife notes that he fell in 11/2015.  He remains on Xarelto.  He has an occasional nose bleed.  He is eating well.  He has lost 4-5 pounds intentionally.  He eats meat daily.  He denies any shortness of breath.     Past Medical History  Diagnosis Date  . Hypertension   . Depression   . Anxiety   . Tremor   . Memory deficit 03/24/2011  . Pacemaker   . Shortness of breath   . Complete heart block ([place])   . Blood transfusion     hx of autologus transfusion  . Neuromuscular disorder ([place])     peripheral  neuropathy  . Arthritis   . Parkinson's disease ([place])   . Parkinsonism ([place]) 10/22/2013  . MCI (mild cognitive impairment) 10/22/2013  . DVT (deep venous thrombosis) [facility])     Past Surgical History  Procedure Laterality Date  . Knee surgery  [phone]  . Replacement total knee bilateral  1997  . Umbilical hernia repair  2001  . Cataract extraction  06/2005  . Rotator cuff repair  06/2008  . Total hip arthroplasty  2009    right  . Defibrilator  04/04/2012    Dual Chamber  . Insert / replace / remove pacemaker  04/04/2012  . Permanent pacemaker insertion N/A 04/04/2012   Procedure: PERMANENT PACEMAKER INSERTION;  Surgeon: Evans Lance, MD;  Location: [facility] CATH LAB;  Service: Cardiovascular;  Laterality: N/A;  . Lead revision N/A 04/05/2012    Procedure: LEAD REVISION;  Surgeon: Deboraha Sprang, MD;  Location: [facility] CATH LAB;  Service: Cardiovascular;  Laterality: N/A;    Family History  Problem Relation Age of Onset  . Hypertension Mother   . Hypertension Father   . Seizures Father     epilepsy  . Prostate cancer Other   . Cancer Other   . Hypertension Other   . Asthma Other     Social History:  reports that he quit smoking about 42 years ago. His smoking use included Cigarettes. He has a 18 pack-year smoking history. He has never used smokeless tobacco. He reports that he drinks alcohol. He reports that he does not use illicit drugs.  The patient is accompanied by his wife today.  Allergies:  Allergies  Allergen Reactions  . Citalopram     Other reaction(s): Unknown  . Codeine Nausea And Vomiting  . Sertraline     Other reaction(s): Unknown  . Lodine [Etodolac] Rash  . Percocet [Oxycodone-Acetaminophen] Rash    Tolerates tylenol Patient is able to take this recently  Current Medications: Current Outpatient Prescriptions  Medication Sig Dispense Refill  . acetaminophen (TYLENOL) 325 MG tablet Take 650 mg by mouth every 6 (six) hours as needed for moderate pain or fever.    . desvenlafaxine (PRISTIQ) 50 MG 24 hr tablet Take 50 mg by mouth every other day.     . lisinopril (PRINIVIL,ZESTRIL) 40 MG tablet TAKE ONE TABLET BY MOUTH EVERY DAY 30 tablet 3  . oxyCODONE-acetaminophen (PERCOCET/ROXICET) 5-325 MG tablet Take 1 tablet by mouth.    . tamsulosin (FLOMAX) 0.4 MG CAPS capsule Take 0.4 mg by mouth.    Alveda Reasons 20 MG TABS tablet TAKE 1 TABLET EVERY EVENING 30 tablet 3   No current facility-administered medications for this visit.    Review of Systems:  GENERAL:  Limited mobility.  Not getting strength back.  No fevers or sweats.   Intentional weight loss of 4-5 pounds. PERFORMANCE STATUS (ECOG):  3 HEENT:  No visual changes, runny nose, sore throat, mouth sores or tenderness. Lungs: No shortness of breath or cough.  No hemoptysis. Cardiac:  No chest pain, palpitations, orthopnea, or PND. GI:  Eating well.  No nausea, vomiting, diarrhea, constipation, melena or hematochezia. GU:  No urgency, frequency, dysuria, or hematuria. Musculoskeletal:  Back and hip pain (positional).  No muscle tenderness. Extremities:  No pain or swelling. Skin:  No rashes or skin changes. Neuro:  Decreased ability to walk.  No headache, numbness, balance or coordination issues. Endocrine:  No diabetes, thyroid issues, hot flashes or night sweats. Psych:  No mood changes, depression or anxiety.  Days and nights mixed up. Pain:  No focal pain. Review of systems:  All other systems reviewed and found to be negative.  Physical Exam: Blood pressure 130/79, pulse 71, temperature 97.7 F (36.5 C), temperature source Tympanic, resp. rate 18, height _0  (1.778 m). GENERAL:  Well developed, well nourished, sitting comfortably in a wheelchair the exam room in no acute distress. MENTAL STATUS:  Alert and oriented to person, place and time. HEAD:  Pearline Cables hair.  Band-aide on forehead.  Normocephalic, atraumatic, face symmetric, no Cushingoid features. EYES:  Glasses.  Blue eyes.  Pupils equal round and reactive to light and accomodation.  No conjunctivitis or scleral icterus. ENT:  Oropharynx clear without lesion.  Tongue normal. Mucous membranes moist.  RESPIRATORY:  Clear to auscultation without rales, wheezes or rhonchi. CARDIOVASCULAR:  Regular rate and rhythm without murmur, rub or gallop. ABDOMEN:  Soft, non-tender, with active bowel sounds, and no hepatosplenomegaly.  No masses. SKIN:  No rashes, ulcers or lesions. EXTREMITIES:  Bilateral 1+ lower extremity edema.  No skin discoloration or tenderness.  No palpable cords. LYMPH NODES:  No  palpable cervical, supraclavicular, axillary or inguinal adenopathy  NEUROLOGICAL:  Unremarkable. PSYCH:  Appropriate.  Appointment on 02/11/2016  Component Date Value Ref Range Status  . WBC 02/11/2016 6.7  3.8 - 10.6 K/uL Final  . RBC 02/11/2016 3.84* 4.40 - 5.90 MIL/uL Final  . Hemoglobin 02/11/2016 12.4* 13.0 - 18.0 g/dL Final  . HCT 02/11/2016 36.9* 40.0 - 52.0 % Final  . MCV 02/11/2016 96.0  80.0 - 100.0 fL Final  . MCH 02/11/2016 32.4  26.0 - 34.0 pg Final  . MCHC 02/11/2016 33.7  32.0 - 36.0 g/dL Final  . RDW 02/11/2016 13.9  11.5 - 14.5 % Final  . Platelets 02/11/2016 151  150 - 440 K/uL Final  . Neutrophils Relative % 02/11/2016 60   Final  . Neutro Abs 02/11/2016 4.0  1.4 -  6.5 K/uL Final  . Lymphocytes Relative 02/11/2016 25   Final  . Lymphs Abs 02/11/2016 1.7  1.0 - 3.6 K/uL Final  . Monocytes Relative 02/11/2016 6   Final  . Monocytes Absolute 02/11/2016 0.4  0.2 - 1.0 K/uL Final  . Eosinophils Relative 02/11/2016 8   Final  . Eosinophils Absolute 02/11/2016 0.6  0 - 0.7 K/uL Final  . Basophils Relative 02/11/2016 1   Final  . Basophils Absolute 02/11/2016 0.1  0 - 0.1 K/uL Final  . Sodium 02/11/2016 132* 135 - 145 mmol/L Final  . Potassium 02/11/2016 3.7  3.5 - 5.1 mmol/L Final  . Chloride 02/11/2016 102  101 - 111 mmol/L Final  . CO2 02/11/2016 27  22 - 32 mmol/L Final  . Glucose, Bld 02/11/2016 100* 65 - 99 mg/dL Final  . BUN 02/11/2016 19  6 - 20 mg/dL Final  . Creatinine, Ser 02/11/2016 1.02  0.61 - 1.24 mg/dL Final  . Calcium 02/11/2016 8.5* 8.9 - 10.3 mg/dL Final  . GFR calc non Af Amer 02/11/2016 >60  >60 mL/min Final  . GFR calc Af Amer 02/11/2016 >60  >60 mL/min Final   Comment: (NOTE) The eGFR has been calculated using the CKD EPI equation. This calculation has not been validated in all clinical situations. eGFR's persistently <60 mL/min signify possible Chronic Kidney Disease.   Georgiann Hahn gap 02/11/2016 3* 5 - 15 Final    Assessment:  Brycin Kille is a 82 y.o. male with Parkinson's and a history of left lower extremity DVT in the setting of immobility and possible dehydration in 09/2014.  He has thrombocytopenia possibly related to Mebetiq (?).  Platelet count ranges between 141,000 and 172,000.  Work-up on 03/17/2015 revealed the following normal studies:  Factor V Leiden, prothrombin gene mutation, lupus anticoagulant, and anticardiolipin antibodies.  He has no evidence of a myeloproliferative disorder.    Left lower extremity duplex on 03/17/2015 revealed interval improvement in the left femoral popliteal DVT.  Thrombus was nonocclusive.    He has a normocytic anemia.  Diet appears good.  B12 was low normal on 02/11/2016.  MMA was normal on 09/25/2015.  He denies any melena or hematochezia.  Symptomatically, his mobility remains extremely limited.  Performance status is 3.  He remains on Xarelto.  He denies any excess bruising or bleeding.  Plan: 1. Labs today:  CBC with diff, CMP, TSH, B12, folate, ferritin. 2. Guaiac cards x 3. 3. Continue Xarelto. 4. RTC in 3 months for MD assessment and labs (CBC with diff, ferritin).   Lequita Asal, MD  02/11/2016, 11:49 AM

## 2016-02-12 LAB — VITAMIN B12: Vitamin B-12: 268 pg/mL (ref 180–914)

## 2016-02-12 LAB — DIFFERENTIAL
Band Neutrophils: 0 %
Basophils Absolute: 0 10*3/uL (ref 0–0.1)
Basophils Relative: 0 %
Blasts: 0 %
EOS ABS: 0.9 10*3/uL — AB (ref 0–0.7)
Eosinophils Relative: 12 %
LYMPHS PCT: 26 %
Lymphs Abs: 1.9 10*3/uL (ref 1.0–3.6)
METAMYELOCYTES PCT: 0 %
MONOS PCT: 4 %
MYELOCYTES: 0 %
Monocytes Absolute: 0.3 10*3/uL (ref 0.2–1.0)
NEUTROS ABS: 3.9 10*3/uL (ref 1.4–6.5)
NRBC: 0 /100{WBCs}
Neutrophils Relative %: 58 %
Other: 0 %
Promyelocytes Absolute: 0 %

## 2016-02-13 DIAGNOSIS — R888 Abnormal findings in other body fluids and substances: Secondary | ICD-10-CM | POA: Diagnosis not present

## 2016-02-13 LAB — OCCULT BLOOD X 1 CARD TO LAB, STOOL: FECAL OCCULT BLD: NEGATIVE

## 2016-02-16 ENCOUNTER — Other Ambulatory Visit
Admission: RE | Admit: 2016-02-16 | Discharge: 2016-02-16 | Disposition: A | Discharge status: Home / Self Care | Payer: Medicare Other | Source: Skilled Nursing Facility | Attending: Hematology and Oncology | Admitting: Hematology and Oncology

## 2016-02-16 DIAGNOSIS — R69 Illness, unspecified: Secondary | ICD-10-CM | POA: Diagnosis present

## 2016-02-16 LAB — OCCULT BLOOD X 1 CARD TO LAB, STOOL: Fecal Occult Bld: NEGATIVE

## 2016-02-21 ENCOUNTER — Encounter: Payer: Self-pay | Admitting: Hematology and Oncology

## 2016-03-12 ENCOUNTER — Encounter
Admission: RE | Admit: 2016-03-12 | Discharge: 2016-03-12 | Disposition: A | Discharge status: Home / Self Care | Payer: Medicare Other | Source: Ambulatory Visit | Attending: Internal Medicine | Admitting: Internal Medicine

## 2016-03-12 DIAGNOSIS — J40 Bronchitis, not specified as acute or chronic: Secondary | ICD-10-CM | POA: Insufficient documentation

## 2016-03-26 DIAGNOSIS — J40 Bronchitis, not specified as acute or chronic: Secondary | ICD-10-CM | POA: Diagnosis present

## 2016-03-26 LAB — RAPID INFLUENZA A&B ANTIGENS (ARMC ONLY): INFLUENZA A (ARMC): NEGATIVE

## 2016-03-26 LAB — CBC WITH DIFFERENTIAL/PLATELET
BASOS PCT: 1 %
Basophils Absolute: 0.1 10*3/uL (ref 0–0.1)
Eosinophils Absolute: 0.6 10*3/uL (ref 0–0.7)
Eosinophils Relative: 7 %
HCT: 36.1 % — ABNORMAL LOW (ref 40.0–52.0)
HEMOGLOBIN: 12.2 g/dL — AB (ref 13.0–18.0)
LYMPHS ABS: 2 10*3/uL (ref 1.0–3.6)
LYMPHS PCT: 24 %
MCH: 32.2 pg (ref 26.0–34.0)
MCHC: 33.8 g/dL (ref 32.0–36.0)
MCV: 95.2 fL (ref 80.0–100.0)
MONO ABS: 0.6 10*3/uL (ref 0.2–1.0)
MONOS PCT: 7 %
NEUTROS ABS: 5.2 10*3/uL (ref 1.4–6.5)
Neutrophils Relative %: 61 %
PLATELETS: 143 10*3/uL — AB (ref 150–440)
RBC: 3.79 MIL/uL — ABNORMAL LOW (ref 4.40–5.90)
RDW: 14.5 % (ref 11.5–14.5)
WBC: 8.4 10*3/uL (ref 3.8–10.6)

## 2016-03-26 LAB — RAPID INFLUENZA A&B ANTIGENS: Influenza B (ARMC): NEGATIVE

## 2016-03-30 ENCOUNTER — Ambulatory Visit (INDEPENDENT_AMBULATORY_CARE_PROVIDER_SITE_OTHER): Payer: Medicare Other | Admitting: *Deleted

## 2016-03-30 ENCOUNTER — Telehealth: Payer: Self-pay | Admitting: Cardiology

## 2016-03-30 DIAGNOSIS — I442 Atrioventricular block, complete: Secondary | ICD-10-CM

## 2016-03-30 NOTE — Telephone Encounter (Signed)
LMOVM reminding pt to send remote transmission.   

## 2016-03-31 NOTE — Progress Notes (Signed)
Remote pacemaker transmission.   

## 2016-04-11 ENCOUNTER — Encounter
Admission: RE | Admit: 2016-04-11 | Discharge: 2016-04-11 | Disposition: A | Discharge status: Home / Self Care | Payer: Medicare Other | Source: Ambulatory Visit | Attending: Internal Medicine | Admitting: Internal Medicine

## 2016-05-04 ENCOUNTER — Other Ambulatory Visit: Payer: Self-pay | Admitting: Internal Medicine

## 2016-05-06 ENCOUNTER — Encounter: Payer: Self-pay | Admitting: Cardiology

## 2016-05-06 LAB — CUP PACEART REMOTE DEVICE CHECK
Battery Remaining Longevity: 83 mo
Brady Statistic AP VP Percent: 28 %
Brady Statistic AP VS Percent: 0 %
Brady Statistic AS VP Percent: 68 %
Brady Statistic AS VS Percent: 4 %
Implantable Lead Implant Date: 20130424
Implantable Lead Location: 753860
Lead Channel Impedance Value: 622 Ohm
Lead Channel Pacing Threshold Amplitude: 0.5 V
Lead Channel Pacing Threshold Pulse Width: 0.4 ms
Lead Channel Sensing Intrinsic Amplitude: 2.8 mV
Lead Channel Setting Pacing Amplitude: 1.5 V
Lead Channel Setting Pacing Pulse Width: 0.4 ms
MDC IDC LEAD IMPLANT DT: 20130424
MDC IDC LEAD LOCATION: 753859
MDC IDC MSMT BATTERY IMPEDANCE: 522 Ohm
MDC IDC MSMT BATTERY VOLTAGE: 2.79 V
MDC IDC MSMT LEADCHNL RA IMPEDANCE VALUE: 490 Ohm
MDC IDC MSMT LEADCHNL RA PACING THRESHOLD AMPLITUDE: 0.375 V
MDC IDC MSMT LEADCHNL RV PACING THRESHOLD PULSEWIDTH: 0.4 ms
MDC IDC SESS DTM: 20170419191357
MDC IDC SET LEADCHNL RV PACING AMPLITUDE: 2 V
MDC IDC SET LEADCHNL RV SENSING SENSITIVITY: 4 mV

## 2016-05-12 ENCOUNTER — Encounter
Admission: RE | Admit: 2016-05-12 | Discharge: 2016-05-12 | Disposition: A | Discharge status: Home / Self Care | Payer: Medicare Other | Source: Ambulatory Visit | Attending: Internal Medicine | Admitting: Internal Medicine

## 2016-05-12 DIAGNOSIS — Z79899 Other long term (current) drug therapy: Secondary | ICD-10-CM | POA: Insufficient documentation

## 2016-05-12 DIAGNOSIS — D649 Anemia, unspecified: Secondary | ICD-10-CM | POA: Insufficient documentation

## 2016-05-12 DIAGNOSIS — R509 Fever, unspecified: Secondary | ICD-10-CM | POA: Insufficient documentation

## 2016-05-12 DIAGNOSIS — R300 Dysuria: Secondary | ICD-10-CM | POA: Insufficient documentation

## 2016-05-12 DIAGNOSIS — D696 Thrombocytopenia, unspecified: Secondary | ICD-10-CM | POA: Insufficient documentation

## 2016-05-12 DIAGNOSIS — R05 Cough: Secondary | ICD-10-CM | POA: Insufficient documentation

## 2016-05-18 ENCOUNTER — Non-Acute Institutional Stay (SKILLED_NURSING_FACILITY): Payer: Medicare Other | Admitting: Gerontology

## 2016-05-18 DIAGNOSIS — D696 Thrombocytopenia, unspecified: Secondary | ICD-10-CM | POA: Diagnosis not present

## 2016-05-18 DIAGNOSIS — Z79899 Other long term (current) drug therapy: Secondary | ICD-10-CM | POA: Diagnosis not present

## 2016-05-18 DIAGNOSIS — R300 Dysuria: Secondary | ICD-10-CM | POA: Diagnosis not present

## 2016-05-18 DIAGNOSIS — N390 Urinary tract infection, site not specified: Secondary | ICD-10-CM

## 2016-05-18 DIAGNOSIS — R509 Fever, unspecified: Secondary | ICD-10-CM | POA: Diagnosis present

## 2016-05-18 DIAGNOSIS — R05 Cough: Secondary | ICD-10-CM | POA: Diagnosis not present

## 2016-05-18 DIAGNOSIS — M1610 Unilateral primary osteoarthritis, unspecified hip: Secondary | ICD-10-CM

## 2016-05-18 DIAGNOSIS — D649 Anemia, unspecified: Secondary | ICD-10-CM | POA: Diagnosis not present

## 2016-05-18 LAB — COMPREHENSIVE METABOLIC PANEL
ALBUMIN: 4.4 g/dL (ref 3.5–5.0)
ALK PHOS: 80 U/L (ref 38–126)
ALT: 14 U/L — ABNORMAL LOW (ref 17–63)
ANION GAP: 11 (ref 5–15)
AST: 18 U/L (ref 15–41)
BILIRUBIN TOTAL: 1.1 mg/dL (ref 0.3–1.2)
BUN: 20 mg/dL (ref 6–20)
CALCIUM: 9.3 mg/dL (ref 8.9–10.3)
CO2: 23 mmol/L (ref 22–32)
Chloride: 104 mmol/L (ref 101–111)
Creatinine, Ser: 0.9 mg/dL (ref 0.61–1.24)
GFR calc Af Amer: >60 mL/min (ref >60)
GFR calc non Af Amer: >60 mL/min (ref >60)
GLUCOSE: 93 mg/dL (ref 65–99)
POTASSIUM: 3.7 mmol/L (ref 3.5–5.1)
SODIUM: 138 mmol/L (ref 135–145)
TOTAL PROTEIN: 6.7 g/dL (ref 6.5–8.1)

## 2016-05-18 LAB — URINALYSIS COMPLETE WITH MICROSCOPIC (ARMC ONLY)
BILIRUBIN URINE: NEGATIVE
GLUCOSE, UA: NEGATIVE mg/dL
Ketones, ur: NEGATIVE mg/dL
Nitrite: POSITIVE — AB
Protein, ur: 30 mg/dL — AB
SQUAMOUS EPITHELIAL / LPF: NONE SEEN
Specific Gravity, Urine: 1.01 (ref 1.005–1.030)
pH: 7 (ref 5.0–8.0)

## 2016-05-18 LAB — CBC WITH DIFFERENTIAL/PLATELET
Basophils Absolute: 0.1 10*3/uL (ref 0–0.1)
Basophils Relative: 0 %
Eosinophils Absolute: 0 10*3/uL (ref 0–0.7)
Eosinophils Relative: 0 %
HEMATOCRIT: 40.8 % (ref 40.0–52.0)
HEMOGLOBIN: 13.5 g/dL (ref 13.0–18.0)
LYMPHS ABS: 1.2 10*3/uL (ref 1.0–3.6)
Lymphocytes Relative: 6 %
MCH: 32.3 pg (ref 26.0–34.0)
MCHC: 33 g/dL (ref 32.0–36.0)
MCV: 97.9 fL (ref 80.0–100.0)
MONO ABS: 1.4 10*3/uL — AB (ref 0.2–1.0)
NEUTROS ABS: 18.4 10*3/uL — AB (ref 1.4–6.5)
Platelets: 155 10*3/uL (ref 150–440)
RBC: 4.17 MIL/uL — ABNORMAL LOW (ref 4.40–5.90)
RDW: 14.2 % (ref 11.5–14.5)
WBC: 21.2 10*3/uL — ABNORMAL HIGH (ref 3.8–10.6)

## 2016-05-18 LAB — TSH: TSH: 0.892 u[IU]/mL (ref 0.350–4.500)

## 2016-05-20 LAB — URINE CULTURE

## 2016-05-24 ENCOUNTER — Other Ambulatory Visit: Payer: Medicare Other

## 2016-05-24 ENCOUNTER — Ambulatory Visit: Payer: Medicare Other | Admitting: Hematology and Oncology

## 2016-05-25 ENCOUNTER — Telehealth: Payer: Self-pay | Admitting: *Deleted

## 2016-05-25 ENCOUNTER — Encounter: Payer: Self-pay | Admitting: Gerontology

## 2016-05-25 NOTE — Telephone Encounter (Signed)
-----   Message from Gala MurdochHeather R Jones, RN sent at 05/25/2016  4:16 PM EDT ----- Regarding: RE: Appt Nehemiah SettleBrooke, this is a Dr. Merlene Pullingorcoran patient not a Dr. B patient.  Team Corcoran please advise Nehemiah SettleBrooke what to do for this patient. Thanks,  -Benard HalstedHeather J.  ----- Message -----    From: Collene LeydenAngela B Fuller    Sent: 05/24/2016   1:26 PM      To: Gala MurdochHeather R Jones, RN Subject: Appt                                           Pt wife wants to know if he really needs to be seen. He is in a wheel chair now and it's very hard to get him around. Appt is scheduled for 05/26/16 @ 2:30 labs and 3:00 w \ provider. If you could please give her a call @ [phone]. Thank you

## 2016-05-25 NOTE — Telephone Encounter (Signed)
Wife states that it is so hard to get him to appt because he is in wheelchair all the time.  When speaking to her she states he is still in [facility].  I told her that i would check with MD and then call her back.  I checked with Merlene PullingCorcoran and she states it is important for him to have labs done but we could postpone the md appt until the labs get back and go from there.  I called [facility] [phone] [facility] and [facility] states that they can draw blood there on tues and Thursday. If I fax it to her today at [phone]. I faxed order over today with confirmation that it wen through and call wife and told her the plan and once we get results we can determine when the f/u cna be.  She is agreeable to the plan

## 2016-05-26 ENCOUNTER — Inpatient Hospital Stay: Payer: Medicare Other

## 2016-05-26 ENCOUNTER — Inpatient Hospital Stay: Payer: Medicare Other | Admitting: Hematology and Oncology

## 2016-05-26 ENCOUNTER — Encounter: Payer: Self-pay | Admitting: Gerontology

## 2016-05-26 DIAGNOSIS — H25019 Cortical age-related cataract, unspecified eye: Secondary | ICD-10-CM | POA: Insufficient documentation

## 2016-05-26 NOTE — Progress Notes (Signed)
[facility]:  [facility] Service:  SNF [phone]) Provider:  Toni Arthurs, NP  [place] Pink, MD  Patient Care Team: [place] Pink, MD as PCP - General (Family Medicine)  Extended Emergency Contact Information Primary Emergency Contact: Hermelinda Medicus States of [place] Phone: [phone] Mobile Phone: [phone] Relation: Spouse Secondary Emergency Contact: Elvina Sidle States of [place] Mobile Phone: [phone] Relation: Son  Code Status:  DNR Goals of care: Advanced Directive information Advanced Directives 02/11/2016  Does patient have an advance directive? Yes  Type of Paramedic of [place];Living will  Does patient want to make changes to advanced directive? No - Patient declined  Copy of advanced directive(s) in chart? Yes  Would patient like information on creating an advanced directive? -     Chief Complaint  Patient presents with  . Fever    HPI:  Pt is a 82 y.o. male seen today for an acute visit for fever, tachycardia and vague complaints of chest pain. Reports the symptoms started earlier in the morning. Describes the pain as pressure in the mid-chest, but then says the pain is in his Left hip, then his right hip. Pt does appear to be more confused this morning than usual, so complete ROS/HPI difficult to obtain. Nursing has tried Norco with little relief. Pt has also had a transient cough for several weeks.   Past Medical History  Diagnosis Date  . Hypertension   . Depression   . Anxiety   . Tremor   . Memory deficit 03/24/2011  . Pacemaker   . Shortness of breath   . Complete heart block ([place])   . Blood transfusion     hx of autologus transfusion  . Neuromuscular disorder ([place])     peripheral  neuropathy  . Arthritis   . Parkinson's disease ([place])   . Parkinsonism ([place]) 10/22/2013  . MCI (mild cognitive impairment) 10/22/2013  . DVT (deep venous thrombosis) [facility])    Past  Surgical History  Procedure Laterality Date  . Knee surgery  [phone]  . Replacement total knee bilateral  1997  . Umbilical hernia repair  2001  . Cataract extraction  06/2005  . Rotator cuff repair  06/2008  . Total hip arthroplasty  2009    right  . Defibrilator  04/04/2012    Dual Chamber  . Insert / replace / remove pacemaker  04/04/2012  . Permanent pacemaker insertion N/A 04/04/2012    Procedure: PERMANENT PACEMAKER INSERTION;  Surgeon: Evans Lance, MD;  Location: [facility] CATH LAB;  Service: Cardiovascular;  Laterality: N/A;  . Lead revision N/A 04/05/2012    Procedure: LEAD REVISION;  Surgeon: Deboraha Sprang, MD;  Location: [facility] CATH LAB;  Service: Cardiovascular;  Laterality: N/A;    Allergies  Allergen Reactions  . Citalopram     Other reaction(s): Unknown  . Codeine Nausea And Vomiting  . Sertraline     Other reaction(s): Unknown  . Lodine [Etodolac] Rash  . Percocet [Oxycodone-Acetaminophen] Rash    Tolerates tylenol Patient is able to take this recently      Medication List       This list is accurate as of: 05/18/16 11:59 PM.  Always use your most recent med list.               acetaminophen 325 MG tablet  Commonly known as:  TYLENOL  Take 650 mg by mouth every 6 (six) hours as needed for moderate pain or  fever.     desvenlafaxine 50 MG 24 hr tablet  Commonly known as:  PRISTIQ  Take 50 mg by mouth every other day.     lisinopril 40 MG tablet  Commonly known as:  PRINIVIL,ZESTRIL  TAKE ONE TABLET BY MOUTH EVERY DAY     oxyCODONE-acetaminophen 5-325 MG tablet  Commonly known as:  PERCOCET/ROXICET  Take 1 tablet by mouth.     tamsulosin 0.4 MG Caps capsule  Commonly known as:  FLOMAX  Take 0.4 mg by mouth.     XARELTO 20 MG Tabs tablet  Generic drug:  rivaroxaban  TAKE 1 TABLET EVERY EVENING        Review of Systems  Constitutional: Positive for fever.  HENT: Negative.   Eyes: Negative.   Respiratory: Positive for cough.   Cardiovascular:  Positive for chest pain (pressure).  Gastrointestinal: Positive for abdominal pain (lower abdomen with palpation). Negative for abdominal distention.  Genitourinary: Positive for frequency. Negative for flank pain.  Musculoskeletal: Positive for back pain, arthralgias and gait problem.  Skin: Negative.   Neurological: Negative.   Psychiatric/Behavioral: Positive for confusion.     There is no immunization history on file for this patient. Pertinent  Health Maintenance Due  Topic Date Due  . PNA vac Low Risk Adult (1 of 2 - PCV13) 03/05/2001  . INFLUENZA VACCINE  07/12/2016   No flowsheet data found. Functional Status Survey:    Filed Vitals:   05/25/16 2142  BP: 100/55  Pulse: 112  Temp: 99.5 F (37.5 C)  Resp: 22  SpO2: 97%   There is no weight on file to calculate BMI. Physical Exam  Constitutional: He appears well-developed and well-nourished. He is active and cooperative.  HENT:  Nose: Nose normal.  Mouth/Throat: Mucous membranes are normal.  Eyes: Conjunctivae, EOM and lids are normal. Pupils are equal, round, and reactive to light.  Neck: Trachea normal. No JVD present.  Cardiovascular: Regular rhythm, normal heart sounds, intact distal pulses and normal pulses.  Tachycardia present.  Exam reveals no gallop.   No murmur heard. Pulmonary/Chest: Effort normal. No accessory muscle usage. No respiratory distress. He has rales (very faint) in the right lower field and the left lower field.  Abdominal: Soft. Normal appearance. There is tenderness (mild) in the suprapubic area.  Musculoskeletal:       Right shoulder: He exhibits pain (B-hips, back).       Right hip: He exhibits decreased strength and tenderness. He exhibits normal range of motion and no crepitus.       Left hip: He exhibits decreased strength and tenderness. He exhibits normal range of motion and no crepitus.  Lymphadenopathy:    He has no cervical adenopathy.  Neurological: He is alert. He is  disoriented (mild confusion about details).  Skin: Skin is warm, dry and intact. No cyanosis. Nails show no clubbing.  Psychiatric: He has a normal mood and affect.    Labs reviewed:  Recent Labs  02/11/16 1100 02/11/16 1530 05/18/16 1046  NA 132* 139 138  K 3.7 4.1 3.7  CL 102 104 104  CO2 _0 GLUCOSE 100* 103* 93  BUN _1 CREATININE 1.02 0.98 0.90  CALCIUM 8.5* 9.0 9.3    Recent Labs  02/11/16 1530 05/18/16 1046  AST 17 18  ALT 15* 14*  ALKPHOS 66 80  BILITOT 0.5 1.1  PROT 6.3* 6.7  ALBUMIN 4.0 4.4    Recent Labs  02/11/16 1530 03/26/16 0610  05/18/16 1520  WBC 7.0 8.4 21.2*  NEUTROABS 3.9 5.2 18.4*  HGB 12.3* 12.2* 13.5  HCT 37.2* 36.1* 40.8  MCV 96.0 95.2 97.9  PLT 160 143* 155   Lab Results  Component Value Date   TSH 0.892 05/18/2016   No results found for: HGBA1C No results found for: CHOL, HDL, LDLCALC, LDLDIRECT, TRIG, CHOLHDL  Significant Diagnostic Results in last 30 days:  No results found.  Assessment/Plan 1. Urinary tract infection, site not specified  Cipro 250 mg po Q 12 hours x 15 days   Bladder scan Q 6 hours  I&O cath if scan >250 mL 2.  Primary Osteoarthritis of one hip  Voltaren gel 1%- massage 2 grams onto each hip and knee TID for pain  Family/ staff Communication: Findings and plan discussed with nursing  Labs/tests ordered:  CBC, Met C,TSH, UA, C&S, CXR, EKG

## 2016-05-27 DIAGNOSIS — R300 Dysuria: Secondary | ICD-10-CM | POA: Diagnosis not present

## 2016-05-27 LAB — CBC WITH DIFFERENTIAL/PLATELET
BASOS PCT: 1 %
Basophils Absolute: 0.1 10*3/uL (ref 0–0.1)
EOS ABS: 0.7 10*3/uL (ref 0–0.7)
EOS PCT: 10 %
HCT: 36.5 % — ABNORMAL LOW (ref 40.0–52.0)
Hemoglobin: 12.3 g/dL — ABNORMAL LOW (ref 13.0–18.0)
LYMPHS ABS: 1.8 10*3/uL (ref 1.0–3.6)
Lymphocytes Relative: 26 %
MCH: 31.9 pg (ref 26.0–34.0)
MCHC: 33.6 g/dL (ref 32.0–36.0)
MCV: 95 fL (ref 80.0–100.0)
MONO ABS: 0.3 10*3/uL (ref 0.2–1.0)
MONOS PCT: 4 %
Neutro Abs: 4 10*3/uL (ref 1.4–6.5)
Neutrophils Relative %: 59 %
PLATELETS: 178 10*3/uL (ref 150–440)
RBC: 3.84 MIL/uL — ABNORMAL LOW (ref 4.40–5.90)
RDW: 14 % (ref 11.5–14.5)
WBC: 6.9 10*3/uL (ref 3.8–10.6)

## 2016-05-27 LAB — FERRITIN: FERRITIN: 179 ng/mL (ref 24–336)

## 2016-06-09 ENCOUNTER — Telehealth: Payer: Self-pay | Admitting: *Deleted

## 2016-06-09 NOTE — Telephone Encounter (Signed)
Calling wife back about when pt next appt would be. She had previously called and wanted to see if he could have lab work at [facility] due to how hard it is to come over and [facility] was ok with that.  His labs looked better on some thing and stable.  Make appt for 3 months from now with labs and see md.  appt will be made for late sept. And mailed to her house.  I told her on voicemail the appt would be mailed to her and if any problems she can call. thanks

## 2016-06-11 ENCOUNTER — Encounter
Admission: RE | Admit: 2016-06-11 | Discharge: 2016-06-11 | Disposition: A | Discharge status: Home / Self Care | Payer: Medicare Other | Source: Ambulatory Visit | Attending: Internal Medicine | Admitting: Internal Medicine

## 2016-06-11 DIAGNOSIS — R39198 Other difficulties with micturition: Secondary | ICD-10-CM | POA: Insufficient documentation

## 2016-06-23 ENCOUNTER — Other Ambulatory Visit: Payer: Self-pay | Admitting: Hematology and Oncology

## 2016-07-01 DIAGNOSIS — R39198 Other difficulties with micturition: Secondary | ICD-10-CM | POA: Diagnosis not present

## 2016-07-01 LAB — URINALYSIS COMPLETE WITH MICROSCOPIC (ARMC ONLY)
BILIRUBIN URINE: NEGATIVE
Bacteria, UA: NONE SEEN
GLUCOSE, UA: NEGATIVE mg/dL
Hgb urine dipstick: NEGATIVE
KETONES UR: NEGATIVE mg/dL
Leukocytes, UA: NEGATIVE
NITRITE: NEGATIVE
Protein, ur: NEGATIVE mg/dL
SPECIFIC GRAVITY, URINE: 1.009 (ref 1.005–1.030)
Squamous Epithelial / LPF: NONE SEEN
pH: 7 (ref 5.0–8.0)

## 2016-07-02 LAB — URINE CULTURE: Culture: NO GROWTH

## 2016-07-12 ENCOUNTER — Ambulatory Visit (INDEPENDENT_AMBULATORY_CARE_PROVIDER_SITE_OTHER): Payer: Medicare Other | Admitting: Internal Medicine

## 2016-07-12 ENCOUNTER — Encounter: Payer: Self-pay | Admitting: Internal Medicine

## 2016-07-12 ENCOUNTER — Encounter
Admission: RE | Admit: 2016-07-12 | Discharge: 2016-07-12 | Disposition: A | Discharge status: Home / Self Care | Payer: Medicare Other | Source: Ambulatory Visit | Attending: Internal Medicine | Admitting: Internal Medicine

## 2016-07-12 VITALS — BP 140/68 | HR 61 | Ht 70.0 in | Wt 200.0 lb

## 2016-07-12 DIAGNOSIS — I442 Atrioventricular block, complete: Secondary | ICD-10-CM | POA: Diagnosis not present

## 2016-07-12 DIAGNOSIS — Z950 Presence of cardiac pacemaker: Secondary | ICD-10-CM | POA: Diagnosis not present

## 2016-07-12 DIAGNOSIS — I10 Essential (primary) hypertension: Secondary | ICD-10-CM

## 2016-07-12 LAB — CUP PACEART INCLINIC DEVICE CHECK
Brady Statistic AP VP Percent: 29 %
Brady Statistic AP VS Percent: 0 %
Brady Statistic AS VP Percent: 68 %
Brady Statistic AS VS Percent: 3 %
Date Time Interrogation Session: 20170801161148
Implantable Lead Implant Date: 20130424
Implantable Lead Model: 5076
Lead Channel Setting Pacing Amplitude: 2 V
Lead Channel Setting Sensing Sensitivity: 4 mV
MDC IDC LEAD IMPLANT DT: 20130424
MDC IDC LEAD LOCATION: 753859
MDC IDC LEAD LOCATION: 753860
MDC IDC MSMT BATTERY IMPEDANCE: 574 Ohm
MDC IDC MSMT BATTERY REMAINING LONGEVITY: 79 mo
MDC IDC MSMT BATTERY VOLTAGE: 2.8 V
MDC IDC MSMT LEADCHNL RA IMPEDANCE VALUE: 470 Ohm
MDC IDC MSMT LEADCHNL RV IMPEDANCE VALUE: 618 Ohm
MDC IDC SET LEADCHNL RA PACING AMPLITUDE: 1.5 V
MDC IDC SET LEADCHNL RV PACING PULSEWIDTH: 0.4 ms

## 2016-07-12 NOTE — Progress Notes (Signed)
Patient Care Team: [name], MD as PCP - General (Family Medicine)   HPI  Randy [name] is a 82 y.o. male seen for pacemaker followup for complete heart block implanted 4/13 and complicated by micropeforation requiring lead reposition   No chest pain or sob or edema;  Exercise toleraqnce is limited  signifivantly by his knees   He is now in skilled nursing with [facility]  Echocardiogram 4/13 demonstrated normal LV function and mild left atrial enlargement  4/16 potassium 4.1 creatinine 0.9  Past Medical History:  Diagnosis Date  . Anxiety   . Arthritis   . Blood transfusion    hx of autologus transfusion  . Complete heart block ([facility])   . Depression   . DVT (deep venous thrombosis) ([facility])   . Hypertension   . MCI (mild cognitive impairment) 10/22/2013  . Memory deficit 03/24/2011  . Neuromuscular disorder (H[facility])    peripheral  neuropathy  . Pacemaker   . Parkinson's disease (H[facility])   . Parkinsonism ([facility]) 10/22/2013  . Shortness of breath   . Tremor     Past Surgical History:  Procedure Laterality Date  . CATARACT EXTRACTION  06/2005  . defibrilator  04/04/2012   Dual Chamber  . INSERT / REPLACE / REMOVE PACEMAKER  04/04/2012  . KNEE SURGERY  [phone]  . LEAD REVISION N/A 04/05/2012   Procedure: LEAD REVISION;  Surgeon: Duke Salvia, MD;  Location: [facility] CATH LAB;  Service: Cardiovascular;  Laterality: N/A;  . PERMANENT PACEMAKER INSERTION N/A 04/04/2012   Procedure: PERMANENT PACEMAKER INSERTION;  Surgeon: Marinus Maw, MD;  Location: [facility] CATH LAB;  Service: Cardiovascular;  Laterality: N/A;  . REPLACEMENT TOTAL KNEE BILATERAL  1997  . ROTATOR CUFF REPAIR  06/2008  . TOTAL HIP ARTHROPLASTY  2009   right  . UMBILICAL HERNIA REPAIR  2001    Current Outpatient Prescriptions  Medication Sig Dispense Refill  . acetaminophen (TYLENOL) 325 MG tablet Take 650 mg by mouth every 6 (six) hours as needed for moderate pain or fever.    .  bisacodyl (DULCOLAX) 10 MG suppository Place 10 mg rectally as needed for moderate constipation.    . cetirizine (ZYRTEC) 5 MG tablet Take 5 mg by mouth daily.    . cyanocobalamin (CVS VITAMIN B12) 1000 MCG tablet Take 1,000 mcg by mouth daily.    Marland Kitchen desvenlafaxine (PRISTIQ) 50 MG 24 hr tablet Take 50 mg by mouth every other day.     . docusate sodium (COLACE) 100 MG capsule Take 100 mg by mouth 2 (two) times daily.    . fluticasone (VERAMYST) 27.5 MCG/SPRAY nasal spray Place 2 sprays into the nose daily.    Marland Kitchen HYDROcodone-acetaminophen (NORCO/VICODIN) 5-325 MG tablet Take 1 tablet by mouth every 6 (six) hours as needed for moderate pain.    Marland Kitchen ipratropium-albuterol (DUONEB) 0.5-2.5 (3) MG/3ML SOLN Take 3 mLs by nebulization 3 (three) times daily as needed.    Marland Kitchen lisinopril (PRINIVIL,ZESTRIL) 40 MG tablet TAKE ONE TABLET BY MOUTH EVERY DAY 30 tablet 3  . magnesium hydroxide (MILK OF MAGNESIA) 400 MG/5ML suspension Take by mouth daily as needed for mild constipation.    Marland Kitchen oxyCODONE-acetaminophen (PERCOCET/ROXICET) 5-325 MG tablet Take 1 tablet by mouth.    . sodium phosphate (FLEET) enema Place 1 enema rectally daily as needed. follow package directions    . tamsulosin (FLOMAX) 0.4 MG CAPS capsule Take 0.4 mg by mouth.    Carlena Hurl 20 MG TABS  tablet TAKE 1 TABLET EVERY EVENING 30 tablet 3  . ZINC OXIDE PO Take by mouth daily as needed.     No current facility-administered medications for this visit.     Allergies  Allergen Reactions  . Citalopram     Other reaction(s): Unknown  . Codeine Nausea And Vomiting  . Sertraline     Other reaction(s): Unknown  . Lodine [Etodolac] Rash  . Percocet [Oxycodone-Acetaminophen] Rash    Tolerates tylenol Patient is able to take this recently    Review of Systems negative except from HPI and PMH  Physical Exam BP 140/68 (BP Location: Left Arm, Patient Position: Sitting, Cuff Size: Normal)   Pulse 61   Ht  (1.778 m)   Wt 200 lb (90.7 kg)   BMI  28.70 kg/m   BP 140/68 (BP Location: Left Arm, Patient Position: Sitting, Cuff Size: Normal)   Pulse 61   Ht  (1.778 m)   Wt 200 lb (90.7 kg)   BMI 28.70 kg/m   Well developed and well nourished in no acute distress HENT normal E scleral and icterus clear Neck Supple Clear to ausculation  Regular rate and rhythm, 2/6  murmurs no gallops or rub Soft with active bowel sounds No clubbing cyanosis  Tr Edema Alert and oriented, grossly normal motor and sensory function  In wheel chair Skin Warm and Dry    Assessment and  Plan  Complete Heart Block stable post pacing  Pacemaker  Medtronic  The patient's device was interrogated.  The information was reviewed. Mode switch was activated   Hypertension   Well controlled  Falls  Now in chair

## 2016-07-12 NOTE — Patient Instructions (Signed)
Medication Instructions:  No changes  Labwork: None  Testing/Procedures: None  Follow-Up: Your physician wants you to follow-up in: 1 year.  You will receive a reminder letter in the mail two months in advance. If you don't receive a letter, please call our office to schedule the follow-up appointment.  Remote monitoring is used to monitor your Pacemaker of ICD from home. This monitoring reduces the number of office visits required to check your device to one time per year. It allows [place] to keep an eye on the functioning of your device to ensure it is working properly. You are scheduled for a device check from home on 07-11-202017. You may send your transmission at any time that day. If you have a wireless device, the transmission will be sent automatically. After your physician reviews your transmission, you will receive a postcard with your next transmission date.   If you need a refill on your cardiac medications before your next appointment, please call your pharmacy.

## 2016-07-14 ENCOUNTER — Encounter: Payer: Self-pay | Admitting: Internal Medicine

## 2016-07-19 ENCOUNTER — Non-Acute Institutional Stay (SKILLED_NURSING_FACILITY): Payer: Medicare Other | Admitting: Gerontology

## 2016-07-19 DIAGNOSIS — M25552 Pain in left hip: Secondary | ICD-10-CM

## 2016-07-19 DIAGNOSIS — I10 Essential (primary) hypertension: Secondary | ICD-10-CM

## 2016-07-19 NOTE — Progress Notes (Signed)
Location:      Place of Service:  SNF (31) Provider:  Lorenso Quarry, NP-C  [name], MD  Patient Care Team: [name], MD as PCP - General (Family Medicine)  Extended Emergency Contact Information Primary Emergency Contact: Marva Panda States of [place] Home Phone: [phone] Mobile Phone: [phone] Relation: Spouse Secondary Emergency Contact: Gwynneth Munson States of [place] Mobile Phone: [phone] Relation: Son  Code Status:  DNR Goals of care: Advanced Directive information Advanced Directives 02/11/2016  Does patient have an advance directive? Yes  Type of Estate agent of [place];Living will  Does patient want to make changes to advanced directive? No - Patient declined  Copy of advanced directive(s) in chart? Yes  Would patient like information on creating an advanced directive? -  Pre-existing out of facility DNR order (yellow form or pink MOST form) -     Chief Complaint  Patient presents with  . Medical Management of Chronic Issues    HPI:  Pt is a 82 y.o. male seen today for medical management of chronic diseases. Pt has chronic pain issues in the left hip and left knee. He is on a very small dose of vicodin scheduled for pain control and prn doses. Pt reports the pain prevents activities and ambulation if he "overdoes it the day before."  Pt does not want to take any more opioids for fear of addiction. Pt educated about the risk of addiction with the dose he is taking, but pt was still concerned. Pt interested in non-opioid methods of pain control; He also has a h/o Hypertension. B/Ps are well controlled on current regimen. No reports of edema. No other c.o.   Past Medical History:  Diagnosis Date  . Anxiety   . Arthritis   . Blood transfusion    hx of autologus transfusion  . Complete heart block ([facility])   . Depression   . DVT (deep venous thrombosis) ([facility])   . Hypertension   . MCI (mild cognitive  impairment) 10/22/2013  . Memory deficit 03/24/2011  . Neuromuscular disorder ([facility])    peripheral  neuropathy  . Pacemaker   . Parkinson's disease ([facility])   . Parkinsonism ([facility]) 10/22/2013  . Shortness of breath   . Tremor    Past Surgical History:  Procedure Laterality Date  . CATARACT EXTRACTION  06/2005  . defibrilator  04/04/2012   Dual Chamber  . INSERT / REPLACE / REMOVE PACEMAKER  04/04/2012  . KNEE SURGERY  [phone]  . LEAD REVISION N/A 04/05/2012   Procedure: LEAD REVISION;  Surgeon: Duke Salvia, MD;  Location: [facility] CATH LAB;  Service: Cardiovascular;  Laterality: N/A;  . PERMANENT PACEMAKER INSERTION N/A 04/04/2012   Procedure: PERMANENT PACEMAKER INSERTION;  Surgeon: Marinus Maw, MD;  Location: [facility] CATH LAB;  Service: Cardiovascular;  Laterality: N/A;  . REPLACEMENT TOTAL KNEE BILATERAL  1997  . ROTATOR CUFF REPAIR  06/2008  . TOTAL HIP ARTHROPLASTY  2009   right  . UMBILICAL HERNIA REPAIR  2001    Allergies  Allergen Reactions  . Citalopram     Other reaction(s): Unknown  . Codeine Nausea And Vomiting  . Sertraline     Other reaction(s): Unknown Other reaction(s): Unknown  . Lodine [Etodolac] Rash  . Percocet [Oxycodone-Acetaminophen] Rash    Tolerates tylenol Patient is able to take this recently      Medication List       Accurate as of 07/19/16  6:43 PM. Always use your most recent  med list.          acetaminophen 325 MG tablet Commonly known as:  TYLENOL Take 650 mg by mouth every 6 (six) hours as needed for moderate pain or fever.   bisacodyl 10 MG suppository Commonly known as:  DULCOLAX Place 10 mg rectally as needed for moderate constipation.   cetirizine 5 MG tablet Commonly known as:  ZYRTEC Take 5 mg by mouth daily.   CVS VITAMIN B12 1000 MCG tablet Generic drug:  cyanocobalamin Take 1,000 mcg by mouth daily.   desvenlafaxine 50 MG 24 hr tablet Commonly known as:  PRISTIQ Take 50 mg by mouth every other day.   docusate sodium  100 MG capsule Commonly known as:  COLACE Take 100 mg by mouth 2 (two) times daily.   fluticasone 27.5 MCG/SPRAY nasal spray Commonly known as:  VERAMYST Place 2 sprays into the nose daily.   HYDROcodone-acetaminophen 5-325 MG tablet Commonly known as:  NORCO/VICODIN Take 1 tablet by mouth every 6 (six) hours as needed for moderate pain.   ipratropium-albuterol 0.5-2.5 (3) MG/3ML Soln Commonly known as:  DUONEB Take 3 mLs by nebulization 3 (three) times daily as needed.   lisinopril 40 MG tablet Commonly known as:  PRINIVIL,ZESTRIL TAKE ONE TABLET BY MOUTH EVERY DAY   magnesium hydroxide 400 MG/5ML suspension Commonly known as:  MILK OF MAGNESIA Take by mouth daily as needed for mild constipation.   oxyCODONE-acetaminophen 5-325 MG tablet Commonly known as:  PERCOCET/ROXICET Take 1 tablet by mouth.   sodium phosphate enema Commonly known as:  FLEET Place 1 enema rectally daily as needed. follow package directions   tamsulosin 0.4 MG Caps capsule Commonly known as:  FLOMAX Take 0.4 mg by mouth.   XARELTO 20 MG Tabs tablet Generic drug:  rivaroxaban TAKE 1 TABLET EVERY EVENING   ZINC OXIDE PO Take by mouth daily as needed.       Review of Systems  Constitutional: Positive for activity change and fatigue. Negative for appetite change, chills, diaphoresis and fever.  HENT: Negative.   Eyes: Negative.   Respiratory: Negative for cough, choking, chest tightness, shortness of breath and wheezing.   Cardiovascular: Negative for chest pain (pressure), palpitations and leg swelling.  Gastrointestinal: Negative for abdominal distention, abdominal pain (lower abdomen with palpation), constipation, diarrhea and nausea.  Genitourinary: Negative for difficulty urinating, flank pain and frequency.  Musculoskeletal: Positive for arthralgias, back pain, gait problem and joint swelling.  Skin: Negative.   Psychiatric/Behavioral: Negative.   All other systems reviewed and are  negative.    There is no immunization history on file for this patient. Pertinent  Health Maintenance Due  Topic Date Due  . PNA vac Low Risk Adult (1 of 2 - PCV13) 03/05/2001  . INFLUENZA VACCINE  07/12/2016   No flowsheet data found. Functional Status Survey:    Vitals:   07/19/16 1839  BP: 122/64  Pulse: (!) 59  Resp: 18  Temp: 98.4 F (36.9 C)  SpO2: 97%   There is no height or weight on file to calculate BMI. Physical Exam  Constitutional: He is oriented to person, place, and time. Vital signs are normal. He appears well-developed and well-nourished. He is active and cooperative. He does not appear ill. No distress.  HENT:  Head: Normocephalic and atraumatic.  Mouth/Throat: Uvula is midline, oropharynx is clear and moist and mucous membranes are normal. Mucous membranes are not pale, not dry and not cyanotic.  Eyes: Conjunctivae, EOM and lids are normal. Pupils are  equal, round, and reactive to light.  Neck: Trachea normal, normal range of motion and full passive range of motion without pain. Neck supple. No JVD present. No tracheal deviation, no edema and no erythema present. No thyromegaly present.  Cardiovascular: Normal rate, regular rhythm, normal heart sounds, intact distal pulses and normal pulses.  Exam reveals no gallop, no distant heart sounds and no friction rub.   No murmur heard. Pulmonary/Chest: Effort normal and breath sounds normal. No accessory muscle usage. No respiratory distress. He has no wheezes. He has no rales. He exhibits no tenderness.  Abdominal: Soft. Normal appearance and bowel sounds are normal. He exhibits no distension and no ascites. There is no tenderness.  Musculoskeletal: He exhibits edema.       Left knee: He exhibits decreased range of motion. Tenderness found.  Expected osteoarthritis, stiffness, weakness  Neurological: He is alert and oriented to person, place, and time. He has normal strength.  Skin: Skin is warm, dry and intact. No  rash noted. He is not diaphoretic. No cyanosis or erythema. No pallor. Nails show no clubbing.  Psychiatric: He has a normal mood and affect. His speech is normal and behavior is normal. Judgment and thought content normal. Cognition and memory are normal.  Nursing note and vitals reviewed.   Labs reviewed:  Recent Labs  02/11/16 1100 02/11/16 1530 05/18/16 1046  NA 132* 139 138  K 3.7 4.1 3.7  CL 102 104 104  CO2 27 28 23   GLUCOSE 100* 103* 93  BUN 19 17 20   CREATININE 1.02 0.98 0.90  CALCIUM 8.5* 9.0 9.3    Recent Labs  02/11/16 1530 05/18/16 1046  AST 17 18  ALT 15* 14*  ALKPHOS 66 80  BILITOT 0.5 1.1  PROT 6.3* 6.7  ALBUMIN 4.0 4.4    Recent Labs  03/26/16 0610 05/18/16 1520 05/27/16 1030  WBC 8.4 21.2* 6.9  NEUTROABS 5.2 18.4* 4.0  HGB 12.2* 13.5 12.3*  HCT 36.1* 40.8 36.5*  MCV 95.2 97.9 95.0  PLT 143* 155 178   Lab Results  Component Value Date   TSH 0.892 05/18/2016   No results found for: HGBA1C No results found for: CHOL, HDL, LDLCALC, LDLDIRECT, TRIG, CHOLHDL  Significant Diagnostic Results in last 30 days:  No results found.  Assessment/Plan 1. Pain of left hip joint Continue current pain regimen of 1/2-1 vicodin QID scheduled for pain and Q 4 hours prn. Lidocaine patches to Left hip and Left knee for pain. Pt does not want to take any more opioids for fear of addiction. Pt educated about the risk of addiction with the dose he is taking, but pt was still concerned. Pt interested in non-opioid methods of pain control;   2. Essential hypertension B/Ps well controlled on current regimen. Monitor for bradycardia. Monitor for edema of the lower extremeties   Family/ staff Communication: Total Time: 35 minutes  Documentation: 15 minutes  Face to Face: 15 minutes  Family/Phone: 5 minutes   Labs/tests ordered:  none  Medication list reviewed and assessed for continued appropriateness. Monthly medication orders reviewed and  signed.  Brynda Rim, NP-C Geriatrics [facility] [phone] [address] [place] [address], [place] [address] Cell Phone (Mon-Fri 8am-5pm):  [phone] On Call:  [phone] & follow prompts after 5pm & weekends Office Phone:  [phone] Office Fax:  [phone]

## 2016-08-10 ENCOUNTER — Non-Acute Institutional Stay (SKILLED_NURSING_FACILITY): Payer: Medicare Other | Admitting: Gerontology

## 2016-08-10 DIAGNOSIS — M5416 Radiculopathy, lumbar region: Secondary | ICD-10-CM

## 2016-08-10 DIAGNOSIS — M1610 Unilateral primary osteoarthritis, unspecified hip: Secondary | ICD-10-CM | POA: Diagnosis not present

## 2016-08-10 DIAGNOSIS — I10 Essential (primary) hypertension: Secondary | ICD-10-CM

## 2016-08-12 ENCOUNTER — Encounter
Admission: RE | Admit: 2016-08-12 | Discharge: 2016-08-12 | Disposition: A | Discharge status: Home / Self Care | Payer: Medicare Other | Source: Ambulatory Visit | Attending: Internal Medicine | Admitting: Internal Medicine

## 2016-08-12 DIAGNOSIS — Z79899 Other long term (current) drug therapy: Secondary | ICD-10-CM | POA: Insufficient documentation

## 2016-08-12 DIAGNOSIS — R888 Abnormal findings in other body fluids and substances: Secondary | ICD-10-CM | POA: Insufficient documentation

## 2016-08-13 NOTE — Progress Notes (Signed)
Location:      Place of Service:  SNF (31) Provider:  Lorenso Quarry, NP-C  [name], MD  Patient Care Team: [name], MD as PCP - General (Family Medicine)  Extended Emergency Contact Information Primary Emergency Contact: Marva Panda [place] Home Phone: [phone] Mobile Phone: [phone] Relation: Spouse Secondary Emergency Contact: Gwynneth Munson [place] of [place] Mobile Phone: [phone] Relation: Son  Code Status:  DNR Goals of care: Advanced Directive information Advanced Directives 02/11/2016  Does patient have an advance directive? Yes  Type of Estate agent of [place];Living will  Does patient want to make changes to advanced directive? No - Patient declined  Copy of advanced directive(s) in chart? Yes  Would patient like information on creating an advanced directive? -  Pre-existing out of facility DNR order (yellow form or pink MOST form) -     Chief Complaint  Patient presents with  . Medication Management  . Medical Management of Chronic Issues    HPI:  Pt is a 82 y.o. male seen today for medical management of chronic diseases. Pt has chronic pain issues in the left hip and left knee. He is on a very small dose of vicodin scheduled for pain control and prn doses. Pt reports the pain is well controlled at this point. He also has a h/o Hypertension. B/Ps are well controlled on current regimen. No reports of edema. No other c/o. VSS   Past Medical History:  Diagnosis Date  . Anxiety   . Arthritis   . Blood transfusion    hx of autologus transfusion  . Complete heart block (HCC)   . Depression   . DVT (deep venous thrombosis) (HCC)   . Hypertension   . MCI (mild cognitive impairment) 10/22/2013  . Memory deficit 03/24/2011  . Neuromuscular disorder (HCC)    peripheral  neuropathy  . Pacemaker   . Parkinson's disease (HCC)   . Parkinsonism (HCC) 10/22/2013  . Shortness of breath   .  Tremor    Past Surgical History:  Procedure Laterality Date  . CATARACT EXTRACTION  06/2005  . defibrilator  04/04/2012   Dual Chamber  . INSERT / REPLACE / REMOVE PACEMAKER  04/04/2012  . KNEE SURGERY  [phone]  . LEAD REVISION N/A 04/05/2012   Procedure: LEAD REVISION;  Surgeon: Duke Salvia, MD;  Location: [facility] CATH LAB;  Service: Cardiovascular;  Laterality: N/A;  . PERMANENT PACEMAKER INSERTION N/A 04/04/2012   Procedure: PERMANENT PACEMAKER INSERTION;  Surgeon: Marinus Maw, MD;  Location: [facility] CATH LAB;  Service: Cardiovascular;  Laterality: N/A;  . REPLACEMENT TOTAL KNEE BILATERAL  1997  . ROTATOR CUFF REPAIR  06/2008  . TOTAL HIP ARTHROPLASTY  2009   right  . UMBILICAL HERNIA REPAIR  2001    Allergies  Allergen Reactions  . Citalopram     Other reaction(s): Unknown  . Codeine Nausea And Vomiting  . Sertraline     Other reaction(s): Unknown Other reaction(s): Unknown  . Lodine [Etodolac] Rash  . Percocet [Oxycodone-Acetaminophen] Rash    Tolerates tylenol Patient is able to take this recently      Medication List       Accurate as of 08/10/16 11:59 PM. Always use your most recent med list.          acetaminophen 325 MG tablet Commonly known as:  TYLENOL Take 650 mg by mouth every 6 (six) hours as needed for moderate pain or fever.   bisacodyl 10 MG  suppository Commonly known as:  DULCOLAX Place 10 mg rectally as needed for moderate constipation.   cetirizine 5 MG tablet Commonly known as:  ZYRTEC Take 5 mg by mouth daily.   [employer] VITAMIN B12 1000 MCG tablet Generic drug:  cyanocobalamin Take 1,000 mcg by mouth daily.   desvenlafaxine 50 MG 24 hr tablet Commonly known as:  PRISTIQ Take 50 mg by mouth every other day.   docusate sodium 100 MG capsule Commonly known as:  COLACE Take 100 mg by mouth 2 (two) times daily.   fluticasone 27.5 MCG/SPRAY nasal spray Commonly known as:  VERAMYST Place 2 sprays into the nose daily.     HYDROcodone-acetaminophen 5-325 MG tablet Commonly known as:  NORCO/VICODIN Take 1 tablet by mouth every 6 (six) hours as needed for moderate pain.   ipratropium-albuterol 0.5-2.5 (3) MG/3ML Soln Commonly known as:  DUONEB Take 3 mLs by nebulization 3 (three) times daily as needed.   lisinopril 40 MG tablet Commonly known as:  PRINIVIL,ZESTRIL TAKE ONE TABLET BY MOUTH EVERY DAY   magnesium hydroxide 400 MG/5ML suspension Commonly known as:  MILK OF MAGNESIA Take by mouth daily as needed for mild constipation.   oxyCODONE-acetaminophen 5-325 MG tablet Commonly known as:  PERCOCET/ROXICET Take 1 tablet by mouth.   sodium phosphate enema Commonly known as:  FLEET Place 1 enema rectally daily as needed. follow package directions   tamsulosin 0.4 MG Caps capsule Commonly known as:  FLOMAX Take 0.4 mg by mouth.   XARELTO 20 MG Tabs tablet Generic drug:  rivaroxaban TAKE 1 TABLET EVERY EVENING   ZINC OXIDE PO Take by mouth daily as needed.       Review of Systems  Constitutional: Negative for activity change, appetite change, chills, diaphoresis, fatigue and fever.  HENT: Negative.   Eyes: Negative.   Respiratory: Negative for cough, choking, chest tightness, shortness of breath and wheezing.   Cardiovascular: Negative for chest pain (pressure), palpitations and leg swelling.  Gastrointestinal: Negative for abdominal distention, abdominal pain (lower abdomen with palpation), constipation, diarrhea and nausea.  Genitourinary: Negative for difficulty urinating, flank pain and frequency.  Musculoskeletal: Positive for arthralgias, back pain, gait problem and joint swelling.  Skin: Negative.   Psychiatric/Behavioral: Negative.   All other systems reviewed and are negative.    There is no immunization history on file for this patient. Pertinent  Health Maintenance Due  Topic Date Due  . PNA vac Low Risk Adult (1 of 2 - PCV13) 03/05/2001  . INFLUENZA VACCINE  07/12/2016    No flowsheet data found. Functional Status Survey:    Vitals:   08/09/16 0500  BP: 126/68  Pulse: 61  Resp: 16  Temp: 98.3 F (36.8 C)  SpO2: 98%   There is no height or weight on file to calculate BMI. Physical Exam  Constitutional: He is oriented to person, place, and time. Vital signs are normal. He appears well-developed and well-nourished. He is active and cooperative. He does not appear ill. No distress.  HENT:  Head: Normocephalic and atraumatic.  Mouth/Throat: Uvula is midline, oropharynx is clear and moist and mucous membranes are normal. Mucous membranes are not pale, not dry and not cyanotic.  Eyes: Conjunctivae, EOM and lids are normal. Pupils are equal, round, and reactive to light.  Neck: Trachea normal, normal range of motion and full passive range of motion without pain. Neck supple. No JVD present. No tracheal deviation, no edema and no erythema present. No thyromegaly present.  Cardiovascular: Normal rate, regular  rhythm, normal heart sounds, intact distal pulses and normal pulses.  Exam reveals no gallop, no distant heart sounds and no friction rub.   No murmur heard. Pulmonary/Chest: Effort normal and breath sounds normal. No accessory muscle usage. No respiratory distress. He has no wheezes. He has no rales. He exhibits no tenderness.  Abdominal: Soft. Normal appearance and bowel sounds are normal. He exhibits no distension and no ascites. There is no tenderness.  Musculoskeletal: He exhibits edema.       Left knee: He exhibits decreased range of motion. Tenderness found.  Expected osteoarthritis, stiffness, weakness  Neurological: He is alert and oriented to person, place, and time. He has normal strength.  Skin: Skin is warm, dry and intact. No rash noted. He is not diaphoretic. No cyanosis or erythema. No pallor. Nails show no clubbing.  Psychiatric: He has a normal mood and affect. His speech is normal and behavior is normal. Judgment and thought content  normal. Cognition and memory are normal.  Nursing note and vitals reviewed.   Labs reviewed:  Recent Labs  02/11/16 1100 02/11/16 1530 05/18/16 1046  NA 132* 139 138  K 3.7 4.1 3.7  CL 102 104 104  CO2 27 28 23   GLUCOSE 100* 103* 93  BUN 19 17 20   CREATININE 1.02 0.98 0.90  CALCIUM 8.5* 9.0 9.3    Recent Labs  02/11/16 1530 05/18/16 1046  AST 17 18  ALT 15* 14*  ALKPHOS 66 80  BILITOT 0.5 1.1  PROT 6.3* 6.7  ALBUMIN 4.0 4.4    Recent Labs  03/26/16 0610 05/18/16 1520 05/27/16 1030  WBC 8.4 21.2* 6.9  NEUTROABS 5.2 18.4* 4.0  HGB 12.2* 13.5 12.3*  HCT 36.1* 40.8 36.5*  MCV 95.2 97.9 95.0  PLT 143* 155 178   Lab Results  Component Value Date   TSH 0.892 05/18/2016   No results found for: HGBA1C No results found for: CHOL, HDL, LDLCALC, LDLDIRECT, TRIG, CHOLHDL  Significant Diagnostic Results in last 30 days:  No results found.  Assessment/Plan 1. Primary Osteoarthritis of one joint 2. Lumbar radiculopathy Continue current pain regimen of 1/2-1 vicodin QID scheduled for pain and Q 4 hours prn. Lidocaine patches to Left hip and Left knee ordered were ineffective .  Rx written  And forwarded to the pharmacy for Hydrocodone as listed above. #90; no refills  3. Essential hypertension B/Ps well controlled on current regimen. Monitor for bradycardia. Monitor for edema of the lower extremeties   Family/ staff Communication:   Total Time:   Documentation:   Face to Face:   Family/Phone:    Labs/tests ordered:  none  Medication list reviewed and assessed for continued appropriateness. Monthly medication orders reviewed and signed.  Brynda Rim, NP-C Geriatrics [facility] [phone] [address], [place] [address] Cell Phone (Mon-Fri 8am-5pm):  [phone] On Call:  [phone] & follow prompts after 5pm & weekends Office Phone:  [phone] Office Fax:  [phone]

## 2016-09-01 ENCOUNTER — Other Ambulatory Visit: Payer: Self-pay | Admitting: *Deleted

## 2016-09-01 DIAGNOSIS — I82409 Acute embolism and thrombosis of unspecified deep veins of unspecified lower extremity: Secondary | ICD-10-CM

## 2016-09-08 ENCOUNTER — Inpatient Hospital Stay: Payer: Medicare Other | Attending: Hematology and Oncology | Admitting: Hematology and Oncology

## 2016-09-08 ENCOUNTER — Inpatient Hospital Stay: Payer: Medicare Other

## 2016-09-08 ENCOUNTER — Encounter: Payer: Self-pay | Admitting: Hematology and Oncology

## 2016-09-08 VITALS — BP 141/84 | HR 80 | Temp 98.0°F | Resp 18 | Wt 212.0 lb

## 2016-09-08 DIAGNOSIS — I825Z2 Chronic embolism and thrombosis of unspecified deep veins of left distal lower extremity: Secondary | ICD-10-CM

## 2016-09-08 DIAGNOSIS — Z86718 Personal history of other venous thrombosis and embolism: Secondary | ICD-10-CM

## 2016-09-08 DIAGNOSIS — D696 Thrombocytopenia, unspecified: Secondary | ICD-10-CM

## 2016-09-08 DIAGNOSIS — F329 Major depressive disorder, single episode, unspecified: Secondary | ICD-10-CM | POA: Diagnosis not present

## 2016-09-08 DIAGNOSIS — M1990 Unspecified osteoarthritis, unspecified site: Secondary | ICD-10-CM | POA: Diagnosis not present

## 2016-09-08 DIAGNOSIS — I82409 Acute embolism and thrombosis of unspecified deep veins of unspecified lower extremity: Secondary | ICD-10-CM

## 2016-09-08 DIAGNOSIS — G20 Parkinson's disease: Secondary | ICD-10-CM | POA: Diagnosis not present

## 2016-09-08 DIAGNOSIS — G3184 Mild cognitive impairment, so stated: Secondary | ICD-10-CM | POA: Insufficient documentation

## 2016-09-08 DIAGNOSIS — I10 Essential (primary) hypertension: Secondary | ICD-10-CM | POA: Insufficient documentation

## 2016-09-08 DIAGNOSIS — Z7901 Long term (current) use of anticoagulants: Secondary | ICD-10-CM | POA: Diagnosis not present

## 2016-09-08 DIAGNOSIS — G629 Polyneuropathy, unspecified: Secondary | ICD-10-CM | POA: Diagnosis not present

## 2016-09-08 DIAGNOSIS — F419 Anxiety disorder, unspecified: Secondary | ICD-10-CM | POA: Diagnosis not present

## 2016-09-08 DIAGNOSIS — Z950 Presence of cardiac pacemaker: Secondary | ICD-10-CM | POA: Insufficient documentation

## 2016-09-08 DIAGNOSIS — I442 Atrioventricular block, complete: Secondary | ICD-10-CM | POA: Insufficient documentation

## 2016-09-08 LAB — COMPREHENSIVE METABOLIC PANEL
ALT: 12 U/L — ABNORMAL LOW (ref 17–63)
AST: 16 U/L (ref 15–41)
Albumin: 4.3 g/dL (ref 3.5–5.0)
Alkaline Phosphatase: 68 U/L (ref 38–126)
Anion gap: 6 (ref 5–15)
BUN: 18 mg/dL (ref 6–20)
CO2: 26 mmol/L (ref 22–32)
Calcium: 9.2 mg/dL (ref 8.9–10.3)
Chloride: 109 mmol/L (ref 101–111)
Creatinine, Ser: 0.96 mg/dL (ref 0.61–1.24)
GFR calc Af Amer: >60 mL/min (ref >60)
GFR calc non Af Amer: >60 mL/min (ref >60)
Glucose, Bld: 102 mg/dL — ABNORMAL HIGH (ref 65–99)
Potassium: 4.3 mmol/L (ref 3.5–5.1)
Sodium: 141 mmol/L (ref 135–145)
Total Bilirubin: 0.7 mg/dL (ref 0.3–1.2)
Total Protein: 6.6 g/dL (ref 6.5–8.1)

## 2016-09-08 LAB — CBC WITH DIFFERENTIAL/PLATELET
Basophils Absolute: 0.1 10*3/uL (ref 0–0.1)
Basophils Relative: 1 %
Eosinophils Absolute: 0.7 10*3/uL (ref 0–0.7)
Eosinophils Relative: 11 %
HCT: 40.1 % (ref 40.0–52.0)
Hemoglobin: 13.8 g/dL (ref 13.0–18.0)
Lymphocytes Relative: 28 %
Lymphs Abs: 1.9 10*3/uL (ref 1.0–3.6)
MCH: 33.1 pg (ref 26.0–34.0)
MCHC: 34.4 g/dL (ref 32.0–36.0)
MCV: 96.1 fL (ref 80.0–100.0)
Monocytes Absolute: 0.4 10*3/uL (ref 0.2–1.0)
Monocytes Relative: 6 %
Neutro Abs: 3.7 10*3/uL (ref 1.4–6.5)
Neutrophils Relative %: 54 %
Platelets: 138 10*3/uL — ABNORMAL LOW (ref 150–440)
RBC: 4.18 MIL/uL — ABNORMAL LOW (ref 4.40–5.90)
RDW: 14.2 % (ref 11.5–14.5)
WBC: 6.8 10*3/uL (ref 3.8–10.6)

## 2016-09-08 LAB — FERRITIN: Ferritin: 76 ng/mL (ref 24–336)

## 2016-09-08 NOTE — Progress Notes (Signed)
Patient is here for follow up[place]

## 2016-09-08 NOTE — Progress Notes (Signed)
[facility] day:  09/08/2016  Chief Complaint: Randy [name] is a 82 y.o. male with a history of left lower extremity deep venous thrombosis (DVT) who is seen for 6 month assessment on Xarelto.  HPI: The patient was last seen in the medical oncology clinic on 02/11/2016.  At that time, his mobility remained extremely limited.  Performance status was 3.  He remained on Xarelto.  He denied any excess bruising or bleeding.  Hematocrit was 37.2.  B12 was 268.  Folate was 11.3.  TSH was normal.  Guaiac cards x 2 were negative.   During the interim, his wife notes that he has been gaining weight. He has been eating well. He likes to eat ice cream and pie in the the cafe.  He has had a lot of discomfort in his hip and knees. A student from [employer] has helped him with physical therapy in the pool.  He denies any bruising or bleeding.      Past Medical History:  Diagnosis Date  . Anxiety   . Arthritis   . Blood transfusion    hx of autologus transfusion  . Complete heart block ([place])   . Depression   . DVT (deep venous thrombosis) ([place])   . Hypertension   . MCI (mild cognitive impairment) 10/22/2013  . Memory deficit 03/24/2011  . Neuromuscular disorder ([place])    peripheral  neuropathy  . Pacemaker   . Parkinson's disease ([place])   . Parkinsonism ([place]) 10/22/2013  . Shortness of breath   . Tremor     Past Surgical History:  Procedure Laterality Date  . CATARACT EXTRACTION  06/2005  . defibrilator  04/04/2012   Dual Chamber  . INSERT / REPLACE / REMOVE PACEMAKER  04/04/2012  . KNEE SURGERY  [phone]  . LEAD REVISION N/A 04/05/2012   Procedure: LEAD REVISION;  Surgeon: Deboraha Sprang, MD;  Location[facility] CATH LAB;  Service: Cardiovascular;  Laterality: N/A;  . PERMANENT PACEMAKER INSERTION N/A 04/04/2012   Procedure: PERMANENT PACEMAKER INSERTION;  Surgeon: Evans Lance, MD;  Location: [facility] CATH LAB;  Service: Cardiovascular;  Laterality:  N/A;  . REPLACEMENT TOTAL KNEE BILATERAL  1997  . ROTATOR CUFF REPAIR  06/2008  . TOTAL HIP ARTHROPLASTY  2009   right  . UMBILICAL HERNIA REPAIR  2001    Family History  Problem Relation Age of Onset  . Hypertension Mother   . Hypertension Father   . Seizures Father     epilepsy  . Prostate cancer Other   . Cancer Other   . Hypertension Other   . Asthma Other     Social History:  reports that he quit smoking about 43 years ago. His smoking use included Cigarettes. He has a 18.00 pack-year smoking history. He has never used smokeless tobacco. He reports that he drinks alcohol. He reports that he does not use drugs.  The patient is accompanied by his wife today.  Allergies:  Allergies  Allergen Reactions  . Citalopram     Other reaction(s): Unknown  . Codeine Nausea And Vomiting  . Sertraline     Other reaction(s): Unknown Other reaction(s): Unknown  . Lodine [Etodolac] Rash  . Percocet [Oxycodone-Acetaminophen] Rash    Tolerates tylenol Patient is able to take this recently    Current Medications: Current Outpatient Prescriptions  Medication Sig Dispense Refill  . acetaminophen (TYLENOL) 325 MG tablet Take 650 mg by mouth every 6 (six) hours as needed  for moderate pain or fever.    . bisacodyl (DULCOLAX) 10 MG suppository Place 10 mg rectally as needed for moderate constipation.    . cetirizine (ZYRTEC) 5 MG tablet Take 5 mg by mouth daily.    . cyanocobalamin (CVS VITAMIN B12) 1000 MCG tablet Take 1,000 mcg by mouth daily.    Marland Kitchen desvenlafaxine (PRISTIQ) 50 MG 24 hr tablet Take 50 mg by mouth every other day.     . docusate sodium (COLACE) 100 MG capsule Take 100 mg by mouth 2 (two) times daily.    . fluticasone (FLONASE) 50 MCG/ACT nasal spray Place into both nostrils daily.    Marland Kitchen gabapentin (NEURONTIN) 100 MG capsule Take 100 mg by mouth at bedtime.    Marland Kitchen HYDROcodone-acetaminophen (NORCO/VICODIN) 5-325 MG tablet Take 1 tablet by mouth every 6 (six) hours as needed for  moderate pain.    Marland Kitchen lisinopril (PRINIVIL,ZESTRIL) 40 MG tablet TAKE ONE TABLET BY MOUTH EVERY DAY 30 tablet 3  . magnesium hydroxide (MILK OF MAGNESIA) 400 MG/5ML suspension Take by mouth daily as needed for mild constipation.    . sodium phosphate (FLEET) enema Place 1 enema rectally daily as needed. follow package directions    . tamsulosin (FLOMAX) 0.4 MG CAPS capsule Take 0.4 mg by mouth.    Alveda Reasons 20 MG TABS tablet TAKE 1 TABLET EVERY EVENING 30 tablet 3  . ZINC OXIDE PO Take by mouth daily as needed.    . fluticasone (VERAMYST) 27.5 MCG/SPRAY nasal spray Place 2 sprays into the nose daily.    Marland Kitchen ipratropium-albuterol (DUONEB) 0.5-2.5 (3) MG/3ML SOLN Take 3 mLs by nebulization 3 (three) times daily as needed.     No current facility-administered medications for this visit.     Review of Systems:  GENERAL:  Limited mobility.  [place].  No fevers or sweats.  Weight gain of 12 pounds. PERFORMANCE STATUS (ECOG):  3 HEENT:  No visual changes, runny nose, sore throat, mouth sores or tenderness. Lungs: No shortness of breath or cough.  No hemoptysis. Cardiac:  No chest pain, palpitations, orthopnea, or PND. GI:  Eating well.  No nausea, vomiting, diarrhea, constipation, melena or hematochezia. GU:  No urgency, frequency, dysuria, or hematuria. Musculoskeletal:  Back and hip pain (positional).  No muscle tenderness. Extremities:  No pain or swelling. Skin:  No rashes or skin changes. Neuro:  Decreased ability to walk.  No headache, numbness, balance or coordination issues. Endocrine:  No diabetes, thyroid issues, hot flashes or night sweats. Psych:  No mood changes, depression or anxiety.  Days and nights mixed up. Pain:  No focal pain. Review of systems:  All other systems reviewed and found to be negative.  Physical Exam: Blood pressure (!) 141/84, pulse 80, temperature 98 F (36.7 C), temperature source Tympanic, resp. rate 18, weight 212 lb (96.2 kg). GENERAL:  Well developed, well  nourished, sitting comfortably in a wheelchair in the exam room in no acute distress. MENTAL STATUS:  Alert and oriented to person, place and time. HEAD:  Thin white hair.  Normocephalic, atraumatic, face symmetric, no Cushingoid features. EYES:  Glasses.  Blue eyes.  Pupils equal round and reactive to light and accomodation.  No conjunctivitis or scleral icterus. ENT:  Oropharynx clear without lesion.  Tongue normal. Mucous membranes moist.  RESPIRATORY:  Clear to auscultation without rales, wheezes or rhonchi. CARDIOVASCULAR:  Regular rate and rhythm without murmur, rub or gallop. ABDOMEN:  Soft, non-tender, with active bowel sounds, and no hepatosplenomegaly.  No masses.  SKIN:  No rashes, ulcers or lesions. EXTREMITIES:  Bilateral 1+ lower extremity edema.  No skin discoloration or tenderness.  No palpable cords. LYMPH NODES:  No palpable cervical, supraclavicular, axillary or inguinal adenopathy  NEUROLOGICAL:  Unremarkable. PSYCH:  Appropriate.   Appointment on 09/08/2016  Component Date Value Ref Range Status  . WBC 09/08/2016 6.8  3.8 - 10.6 K/uL Final  . RBC 09/08/2016 4.18* 4.40 - 5.90 MIL/uL Final  . Hemoglobin 09/08/2016 13.8  13.0 - 18.0 g/dL Final  . HCT 09/08/2016 40.1  40.0 - 52.0 % Final  . MCV 09/08/2016 96.1  80.0 - 100.0 fL Final  . MCH 09/08/2016 33.1  26.0 - 34.0 pg Final  . MCHC 09/08/2016 34.4  32.0 - 36.0 g/dL Final  . RDW 09/08/2016 14.2  11.5 - 14.5 % Final  . Platelets 09/08/2016 138* 150 - 440 K/uL Final  . Neutrophils Relative % 09/08/2016 54  % Final  . Lymphocytes Relative 09/08/2016 28  % Final  . Monocytes Relative 09/08/2016 6  % Final  . Eosinophils Relative 09/08/2016 11  % Final  . Basophils Relative 09/08/2016 1  % Final  . Neutro Abs 09/08/2016 3.7  1.4 - 6.5 K/uL Final  . Lymphs Abs 09/08/2016 1.9  1.0 - 3.6 K/uL Final  . Monocytes Absolute 09/08/2016 0.4  0.2 - 1.0 K/uL Final  . Eosinophils Absolute 09/08/2016 0.7  0 - 0.7 K/uL Final  .  Basophils Absolute 09/08/2016 0.1  0 - 0.1 K/uL Final  . Ferritin 09/08/2016 76  24 - 336 ng/mL Final  . Sodium 09/08/2016 141  135 - 145 mmol/L Final  . Potassium 09/08/2016 4.3  3.5 - 5.1 mmol/L Final  . Chloride 09/08/2016 109  101 - 111 mmol/L Final  . CO2 09/08/2016 26  22 - 32 mmol/L Final  . Glucose, Bld 09/08/2016 102* 65 - 99 mg/dL Final  . BUN 09/08/2016 18  6 - 20 mg/dL Final  . Creatinine, Ser 09/08/2016 0.96  0.61 - 1.24 mg/dL Final  . Calcium 09/08/2016 9.2  8.9 - 10.3 mg/dL Final  . Total Protein 09/08/2016 6.6  6.5 - 8.1 g/dL Final  . Albumin 09/08/2016 4.3  3.5 - 5.0 g/dL Final  . AST 09/08/2016 16  15 - 41 U/L Final  . ALT 09/08/2016 12* 17 - 63 U/L Final  . Alkaline Phosphatase 09/08/2016 68  38 - 126 U/L Final  . Total Bilirubin 09/08/2016 0.7  0.3 - 1.2 mg/dL Final  . GFR calc non Af Amer 09/08/2016 >60  >60 mL/min Final  . GFR calc Af Amer 09/08/2016 >60  >60 mL/min Final   Comment: (NOTE) The eGFR has been calculated using the CKD EPI equation. This calculation has not been validated in all clinical situations. eGFR's persistently <60 mL/min signify possible Chronic Kidney Disease.   Georgiann Hahn gap 09/08/2016 6  5 - 15 Final    Assessment:  Obediah Welles is a 82 y.o. male with Parkinson's and a history of left lower extremity DVT in the setting of immobility and possible dehydration in 09/2014.  He remains on Xarelto secondary to his persistent immobility.  Hypercoagulable work-up on 03/17/2015 revealed the following normal studies:  Factor V Leiden, prothrombin gene mutation, lupus anticoagulant, and anticardiolipin antibodies.  He has no evidence of a myeloproliferative disorder.    Left lower extremity duplex on 03/17/2015 revealed interval improvement in the left femoral popliteal DVT.  Thrombus was nonocclusive.    He has thrombocytopenia possibly related  to Mebetiq (?).  Platelet count ranges between 141,000 and 172,000.  He has a history of a  normocytic anemia.  Diet is good.  B12 was low normal on 02/11/2016.  MMA was normal on 09/25/2015.  He denies any melena or hematochezia.  Symptomatically, his mobility remains limited.  Exam reveals chronic lower extremity changes.  Plan: 1.  Labs today:  CBC with diff, CMP, ferritin, MMA 2.  Continue Xarelto. 3.  RTC in 6 months for MD assessment and labs (CBC with diff, CMP, ferritin).   Lequita Asal, MD  09/08/2016, 3:37 PM

## 2016-09-09 ENCOUNTER — Other Ambulatory Visit: Payer: Self-pay | Admitting: *Deleted

## 2016-09-09 DIAGNOSIS — I825Z2 Chronic embolism and thrombosis of unspecified deep veins of left distal lower extremity: Secondary | ICD-10-CM

## 2016-09-09 DIAGNOSIS — D649 Anemia, unspecified: Secondary | ICD-10-CM

## 2016-09-11 ENCOUNTER — Encounter
Admission: RE | Admit: 2016-09-11 | Discharge: 2016-09-11 | Disposition: A | Discharge status: Home / Self Care | Payer: Medicare Other | Source: Ambulatory Visit | Attending: Internal Medicine | Admitting: Internal Medicine

## 2016-09-12 ENCOUNTER — Other Ambulatory Visit: Payer: Self-pay | Admitting: Hematology and Oncology

## 2016-09-12 LAB — METHYLMALONIC ACID, SERUM: Methylmalonic Acid, Quantitative: 174 nmol/L (ref 0–378)

## 2016-10-11 ENCOUNTER — Telehealth: Payer: Self-pay | Admitting: Cardiology

## 2016-10-11 ENCOUNTER — Ambulatory Visit (INDEPENDENT_AMBULATORY_CARE_PROVIDER_SITE_OTHER): Payer: Medicare Other | Admitting: *Deleted

## 2016-10-11 DIAGNOSIS — I442 Atrioventricular block, complete: Secondary | ICD-10-CM | POA: Diagnosis not present

## 2016-10-11 NOTE — Telephone Encounter (Signed)
LMOVM reminding pt to send remote transmission.   

## 2016-10-12 ENCOUNTER — Encounter
Admission: RE | Admit: 2016-10-12 | Discharge: 2016-10-12 | Disposition: A | Discharge status: Home / Self Care | Payer: Medicare Other | Source: Ambulatory Visit | Attending: Internal Medicine | Admitting: Internal Medicine

## 2016-10-12 NOTE — Progress Notes (Signed)
Remote pacemaker transmission.   

## 2016-10-19 ENCOUNTER — Encounter: Payer: Self-pay | Admitting: Cardiology

## 2016-10-28 ENCOUNTER — Other Ambulatory Visit: Payer: Self-pay | Admitting: Hematology and Oncology

## 2016-11-08 ENCOUNTER — Other Ambulatory Visit: Payer: Self-pay | Admitting: Neurology

## 2016-11-08 DIAGNOSIS — R269 Unspecified abnormalities of gait and mobility: Secondary | ICD-10-CM

## 2016-11-10 LAB — CUP PACEART REMOTE DEVICE CHECK
Battery Voltage: 2.79 V
Brady Statistic AP VS Percent: 0 %
Brady Statistic AS VP Percent: 60 %
Implantable Lead Implant Date: 20130424
Implantable Lead Location: 753860
Implantable Lead Model: 5076
Implantable Lead Model: 5076
Lead Channel Pacing Threshold Amplitude: 0.375 V
Lead Channel Pacing Threshold Pulse Width: 0.4 ms
Lead Channel Pacing Threshold Pulse Width: 0.4 ms
Lead Channel Setting Pacing Pulse Width: 0.4 ms
MDC IDC LEAD IMPLANT DT: 20130424
MDC IDC LEAD LOCATION: 753859
MDC IDC MSMT BATTERY IMPEDANCE: 676 Ohm
MDC IDC MSMT BATTERY REMAINING LONGEVITY: 72 mo
MDC IDC MSMT LEADCHNL RA IMPEDANCE VALUE: 483 Ohm
MDC IDC MSMT LEADCHNL RV IMPEDANCE VALUE: 572 Ohm
MDC IDC MSMT LEADCHNL RV PACING THRESHOLD AMPLITUDE: 0.75 V
MDC IDC PG IMPLANT DT: 20130424
MDC IDC SESS DTM: 20171031164113
MDC IDC SET LEADCHNL RA PACING AMPLITUDE: 1.5 V
MDC IDC SET LEADCHNL RV PACING AMPLITUDE: 2 V
MDC IDC SET LEADCHNL RV SENSING SENSITIVITY: 4 mV
MDC IDC STAT BRADY AP VP PERCENT: 40 %
MDC IDC STAT BRADY AS VS PERCENT: 1 %

## 2016-11-11 ENCOUNTER — Encounter
Admission: RE | Admit: 2016-11-11 | Discharge: 2016-11-11 | Disposition: A | Discharge status: Home / Self Care | Payer: Medicare Other | Source: Ambulatory Visit | Attending: Internal Medicine | Admitting: Internal Medicine

## 2016-11-14 ENCOUNTER — Ambulatory Visit
Admission: RE | Admit: 2016-11-14 | Discharge: 2016-11-14 | Disposition: A | Discharge status: Home / Self Care | Payer: Medicare Other | Source: Ambulatory Visit | Attending: Neurology | Admitting: Neurology

## 2016-11-14 DIAGNOSIS — M4802 Spinal stenosis, cervical region: Secondary | ICD-10-CM | POA: Insufficient documentation

## 2016-11-14 DIAGNOSIS — Z9889 Other specified postprocedural states: Secondary | ICD-10-CM | POA: Insufficient documentation

## 2016-11-14 DIAGNOSIS — R269 Unspecified abnormalities of gait and mobility: Secondary | ICD-10-CM | POA: Diagnosis present

## 2016-11-14 DIAGNOSIS — R29898 Other symptoms and signs involving the musculoskeletal system: Secondary | ICD-10-CM | POA: Insufficient documentation

## 2016-11-14 DIAGNOSIS — M5030 Other cervical disc degeneration, unspecified cervical region: Secondary | ICD-10-CM | POA: Diagnosis not present

## 2016-11-14 DIAGNOSIS — G319 Degenerative disease of nervous system, unspecified: Secondary | ICD-10-CM | POA: Insufficient documentation

## 2016-12-12 ENCOUNTER — Encounter
Admission: RE | Admit: 2016-12-12 | Discharge: 2016-12-12 | Disposition: A | Discharge status: Home / Self Care | Payer: Medicare Other | Source: Ambulatory Visit | Attending: Internal Medicine | Admitting: Internal Medicine

## 2016-12-20 ENCOUNTER — Non-Acute Institutional Stay (SKILLED_NURSING_FACILITY): Payer: Medicare Other | Admitting: Gerontology

## 2016-12-20 DIAGNOSIS — M25552 Pain in left hip: Secondary | ICD-10-CM | POA: Diagnosis not present

## 2016-12-20 DIAGNOSIS — G20 Parkinson's disease: Secondary | ICD-10-CM | POA: Diagnosis not present

## 2016-12-20 NOTE — Progress Notes (Signed)
Location:      Place of Service:  SNF (31) Provider:  Lorenso QuarryShannon Belia Febo, NP-C  Jerl MinaJames Hedrick, MD  Patient Care Team: Jerl MinaJames Hedrick, MD as PCP - General (Family Medicine)  Extended Emergency Contact Information Primary Emergency Contact: Marva PandaGilliam,Polly  [place] Home Phone: [phone] Mobile Phone: [phone] Relation: Spouse Secondary Emergency Contact: Gwynneth MunsonGilliam,Jeffery  [place] Mobile Phone: [phone] Relation: Son  Code Status:  DNR Goals of care: Advanced Directive information Advanced Directives 09/08/2016  Does Patient Have a Medical Advance Directive? Yes  Type of Advance Directive -  Does patient want to make changes to medical advance directive? -  Copy of Healthcare Power of Attorney in Chart? -  Would patient like information on creating a medical advance directive? -  Pre-existing out of facility DNR order (yellow form or pink MOST form) -     Chief Complaint  Patient presents with  . Medical Management of Chronic Issues  . Medication Management    HPI:  Pt is a 83 y.o. male seen today for medical management of chronic diseases. Pt has chronic pain issues in the left hip and left knee. He is on a very small dose of vicodin scheduled for pain control and prn doses. Pt reports the pain prevents activities and ambulation if he "overdoes it the day before."  Pt does not want to take any more opioids for fear of addiction. Pt reports the current pain control regimen is working well for him and his pain is very little. He also has a h/o Hypertension. B/Ps are well controlled on current regimen. No reports of edema. He has a h/o parkinson's disease. No current pharmaceutical intervention. No disruptive tremors. Speech slightly slurred and soft voice. Pt reports overall he is feeling well. VSS. No other c.o.   Past Medical History:  Diagnosis Date  . Anxiety   . Arthritis   . Blood transfusion    hx of autologus transfusion  .  Complete heart block (HCC)   . Depression   . DVT (deep venous thrombosis) (HCC)   . Hypertension   . MCI (mild cognitive impairment) 10/22/2013  . Memory deficit 03/24/2011  . Neuromuscular disorder (HCC)    peripheral  neuropathy  . Pacemaker   . Parkinson's disease (HCC)   . Parkinsonism (HCC) 10/22/2013  . Shortness of breath   . Tremor    Past Surgical History:  Procedure Laterality Date  . CATARACT EXTRACTION  06/2005  . defibrilator  04/04/2012   Dual Chamber  . INSERT / REPLACE / REMOVE PACEMAKER  04/04/2012  . KNEE SURGERY  [phone],1979,1996  . LEAD REVISION N/A 04/05/2012   Procedure: LEAD REVISION;  Surgeon: Duke SalviaSteven C Klein, MD;  [facility];  Service: Cardiovascular;  Laterality: N/A;  . PERMANENT PACEMAKER INSERTION N/A 04/04/2012   Procedure: PERMANENT PACEMAKER INSERTION;  Surgeon: Marinus MawGregg W Taylor, MD;  [facility];  Service: Cardiovascular;  Laterality: N/A;  . REPLACEMENT TOTAL KNEE BILATERAL  1997  . ROTATOR CUFF REPAIR  06/2008  . TOTAL HIP ARTHROPLASTY  2009   right  . UMBILICAL HERNIA REPAIR  2001    Allergies  Allergen Reactions  . Citalopram     Other reaction(s): Unknown  . Codeine Nausea And Vomiting  . Sertraline     Other reaction(s): Unknown Other reaction(s): Unknown  . Lodine [Etodolac] Rash  . Percocet [Oxycodone-Acetaminophen] Rash    Tolerates tylenol Patient is able to take this recently    Allergies  as of 12/20/2016      Reactions   Citalopram    Other reaction(s): Unknown   Codeine Nausea And Vomiting   Sertraline    Other reaction(s): Unknown Other reaction(s): Unknown   Lodine [etodolac] Rash   Percocet [oxycodone-acetaminophen] Rash   Tolerates tylenol Patient is able to take this recently      Medication List       Accurate as of 12/20/16 10:54 PM. Always use your most recent med list.          acetaminophen 325 MG tablet Commonly known as:  TYLENOL Take 650 mg by mouth every 6 (six) hours as needed for  moderate pain or fever.   bisacodyl 10 MG suppository Commonly known as:  DULCOLAX Place 10 mg rectally as needed for moderate constipation.   cetirizine 5 MG tablet Commonly known as:  ZYRTEC Take 5 mg by mouth daily.   [employer]S VITAMIN B12 1000 MCG tablet Generic drug:  cyanocobalamin Take 1,000 mcg by mouth daily.   desvenlafaxine 50 MG 24 hr tablet Commonly known as:  PRISTIQ Take 50 mg by mouth every other day.   docusate sodium 100 MG capsule Commonly known as:  COLACE Take 100 mg by mouth 2 (two) times daily.   FLONASE 50 MCG/ACT nasal spray Generic drug:  fluticasone Place into both nostrils daily.   fluticasone 27.5 MCG/SPRAY nasal spray Commonly known as:  VERAMYST Place 2 sprays into the nose daily.   gabapentin 100 MG capsule Commonly known as:  NEURONTIN Take 100 mg by mouth at bedtime.   HYDROcodone-acetaminophen 5-325 MG tablet Commonly known as:  NORCO/VICODIN Take 1 tablet by mouth every 6 (six) hours as needed for moderate pain.   ipratropium-albuterol 0.5-2.5 (3) MG/3ML Soln Commonly known as:  DUONEB Take 3 mLs by nebulization 3 (three) times daily as needed.   lisinopril 40 MG tablet Commonly known as:  PRINIVIL,ZESTRIL TAKE ONE TABLET BY MOUTH EVERY DAY   magnesium hydroxide 400 MG/5ML suspension Commonly known as:  MILK OF MAGNESIA Take by mouth daily as needed for mild constipation.   sodium phosphate enema Place 1 enema rectally daily as needed. follow package directions   tamsulosin 0.4 MG Caps capsule Commonly known as:  FLOMAX Take 0.4 mg by mouth.   XARELTO 20 MG Tabs tablet Generic drug:  rivaroxaban TAKE 1 TABLET EVERY EVENING   ZINC OXIDE PO Take by mouth daily as needed.       Review of Systems  Constitutional: Negative for activity change, appetite change, chills, diaphoresis, fatigue and fever.  HENT: Negative.   Eyes: Negative.   Respiratory: Negative for cough, choking, chest tightness, shortness of breath and  wheezing.   Cardiovascular: Negative for chest pain (pressure), palpitations and leg swelling.  Gastrointestinal: Negative for abdominal distention, abdominal pain (lower abdomen with palpation), constipation, diarrhea and nausea.  Genitourinary: Negative for difficulty urinating, flank pain and frequency.  Musculoskeletal: Positive for arthralgias, back pain, gait problem and joint swelling.  Skin: Negative.   Psychiatric/Behavioral: Negative.   All other systems reviewed and are negative.    There is no immunization history on file for this patient. Pertinent  Health Maintenance Due  Topic Date Due  . PNA vac Low Risk Adult (1 of 2 - PCV13) 03/05/2001  . INFLUENZA VACCINE  07/12/2016   No flowsheet data found. Functional Status Survey:    Vitals:   12/14/16 0800  BP: 127/70  Pulse: 72  Resp: 18  Temp: 98.4 F (36.9 C)  SpO2: 94%   There is no height or weight on file to calculate BMI. Physical Exam  Constitutional: He is oriented to person, place, and time. Vital signs are normal. He appears well-developed and well-nourished. He is active and cooperative. He does not appear ill. No distress.  HENT:  Head: Normocephalic and atraumatic.  Mouth/Throat: Uvula is midline, oropharynx is clear and moist and mucous membranes are normal. Mucous membranes are not pale, not dry and not cyanotic.  Eyes: Conjunctivae, EOM and lids are normal. Pupils are equal, round, and reactive to light.  Neck: Trachea normal, normal range of motion and full passive range of motion without pain. Neck supple. No JVD present. No tracheal deviation, no edema and no erythema present. No thyromegaly present.  Cardiovascular: Normal rate, regular rhythm, normal heart sounds, intact distal pulses and normal pulses.  Exam reveals no gallop, no distant heart sounds and no friction rub.   No murmur heard. Pulmonary/Chest: Effort normal and breath sounds normal. No accessory muscle usage. No respiratory distress.  He has no wheezes. He has no rales. He exhibits no tenderness.  Abdominal: Soft. Normal appearance and bowel sounds are normal. He exhibits no distension and no ascites. There is no tenderness.  Musculoskeletal: He exhibits no edema.       Left knee: He exhibits decreased range of motion. Tenderness found.  Expected osteoarthritis, stiffness, weakness  Neurological: He is alert and oriented to person, place, and time. He has normal strength.  Skin: Skin is warm, dry and intact. No rash noted. He is not diaphoretic. No cyanosis or erythema. No pallor. Nails show no clubbing.  Psychiatric: He has a normal mood and affect. His speech is normal and behavior is normal. Judgment and thought content normal. Cognition and memory are normal.  Nursing note and vitals reviewed.   Labs reviewed:  Recent Labs  02/11/16 1530 05/18/16 1046 09/08/16 1500  NA 139 138 141  K 4.1 3.7 4.3  CL 104 104 109  CO2 28 23 26   GLUCOSE 103* 93 102*  BUN 17 20 18   CREATININE 0.98 0.90 0.96  CALCIUM 9.0 9.3 9.2    Recent Labs  02/11/16 1530 05/18/16 1046 09/08/16 1500  AST 17 18 16   ALT 15* 14* 12*  ALKPHOS 66 80 68  BILITOT 0.5 1.1 0.7  PROT 6.3* 6.7 6.6  ALBUMIN 4.0 4.4 4.3    Recent Labs  05/18/16 1520 05/27/16 1030 09/08/16 1500  WBC 21.2* 6.9 6.8  NEUTROABS 18.4* 4.0 3.7  HGB 13.5 12.3* 13.8  HCT 40.8 36.5* 40.1  MCV 97.9 95.0 96.1  PLT 155 178 138*   Lab Results  Component Value Date   TSH 0.892 05/18/2016   No results found for: HGBA1C No results found for: CHOL, HDL, LDLCALC, LDLDIRECT, TRIG, CHOLHDL  Significant Diagnostic Results in last 30 days:  No results found.  Assessment/Plan 1. Pain of left hip joint  Continue current pain regimen of 1/2-1 vicodin QID scheduled for pain   and Q 4 hours prn.  2. Essential hypertension  B/Ps well controlled on current regimen.   Monitor for bradycardia.   Monitor for edema of the lower extremeties  3. Parkinson's  Disease  No current treatment at this time  Monitor for tremors or other s/s  Family/ staff Communication:   Total Time:   Documentation:   Face to Face:   Family/Phone:    Labs/tests ordered:  none  Medication list reviewed and assessed for continued appropriateness. Monthly medication orders  reviewed and signed.  Vikki Ports, NP-C Geriatrics [facility] [phone], [place] [address] Cell Phone (Mon-Fri 8am-5pm):  [phone] On Call:  [phone] & follow prompts after 5pm & weekends Office Phone:  [phone] Office Fax:  [phone]

## 2017-01-10 ENCOUNTER — Encounter: Payer: Medicare Other | Admitting: *Deleted

## 2017-01-10 ENCOUNTER — Telehealth: Payer: Self-pay | Admitting: Cardiology

## 2017-01-10 NOTE — Telephone Encounter (Signed)
LMOVM reminding pt to send remote transmission.   

## 2017-01-12 ENCOUNTER — Encounter
Admission: RE | Admit: 2017-01-12 | Discharge: 2017-01-12 | Disposition: A | Discharge status: Home / Self Care | Payer: Medicare Other | Source: Ambulatory Visit | Attending: Internal Medicine | Admitting: Internal Medicine

## 2017-01-12 ENCOUNTER — Encounter: Payer: Self-pay | Admitting: Cardiology

## 2017-01-12 DIAGNOSIS — R350 Frequency of micturition: Secondary | ICD-10-CM | POA: Insufficient documentation

## 2017-01-23 ENCOUNTER — Ambulatory Visit (INDEPENDENT_AMBULATORY_CARE_PROVIDER_SITE_OTHER): Payer: Medicare Other | Admitting: *Deleted

## 2017-01-23 DIAGNOSIS — I442 Atrioventricular block, complete: Secondary | ICD-10-CM

## 2017-01-25 NOTE — Progress Notes (Signed)
Remote pacemaker transmission.   

## 2017-01-26 ENCOUNTER — Encounter: Payer: Self-pay | Admitting: Cardiology

## 2017-01-26 LAB — CUP PACEART REMOTE DEVICE CHECK
Battery Voltage: 2.79 V
Brady Statistic AP VS Percent: 0 %
Date Time Interrogation Session: 20180212190143
Implantable Lead Implant Date: 20130424
Implantable Lead Implant Date: 20130424
Implantable Lead Location: 753859
Lead Channel Pacing Threshold Pulse Width: 0.4 ms
Lead Channel Pacing Threshold Pulse Width: 0.4 ms
Lead Channel Setting Pacing Amplitude: 2 V
Lead Channel Setting Pacing Pulse Width: 0.4 ms
Lead Channel Setting Sensing Sensitivity: 4 mV
MDC IDC LEAD LOCATION: 753860
MDC IDC MSMT BATTERY IMPEDANCE: 804 Ohm
MDC IDC MSMT BATTERY REMAINING LONGEVITY: 66 mo
MDC IDC MSMT LEADCHNL RA IMPEDANCE VALUE: 470 Ohm
MDC IDC MSMT LEADCHNL RA PACING THRESHOLD AMPLITUDE: 0.375 V
MDC IDC MSMT LEADCHNL RA SENSING INTR AMPL: 2.8 mV
MDC IDC MSMT LEADCHNL RV IMPEDANCE VALUE: 611 Ohm
MDC IDC MSMT LEADCHNL RV PACING THRESHOLD AMPLITUDE: 0.5 V
MDC IDC PG IMPLANT DT: 20130424
MDC IDC SET LEADCHNL RA PACING AMPLITUDE: 1.5 V
MDC IDC STAT BRADY AP VP PERCENT: 42 %
MDC IDC STAT BRADY AS VP PERCENT: 58 %
MDC IDC STAT BRADY AS VS PERCENT: 1 %

## 2017-02-06 ENCOUNTER — Other Ambulatory Visit: Payer: Self-pay | Admitting: Hematology and Oncology

## 2017-02-07 DIAGNOSIS — R350 Frequency of micturition: Secondary | ICD-10-CM | POA: Diagnosis not present

## 2017-02-07 LAB — URINALYSIS, COMPLETE (UACMP) WITH MICROSCOPIC
BILIRUBIN URINE: NEGATIVE
Bacteria, UA: NONE SEEN
GLUCOSE, UA: NEGATIVE mg/dL
HGB URINE DIPSTICK: NEGATIVE
Ketones, ur: NEGATIVE mg/dL
Leukocytes, UA: NEGATIVE
NITRITE: NEGATIVE
PH: 7 (ref 5.0–8.0)
Protein, ur: NEGATIVE mg/dL
RBC / HPF: NONE SEEN RBC/hpf (ref 0–5)
SPECIFIC GRAVITY, URINE: 1.002 — AB (ref 1.005–1.030)
Squamous Epithelial / LPF: NONE SEEN
WBC UA: NONE SEEN WBC/hpf (ref 0–5)

## 2017-02-08 LAB — URINE CULTURE: Culture: NO GROWTH

## 2017-02-09 ENCOUNTER — Encounter
Admission: RE | Admit: 2017-02-09 | Discharge: 2017-02-09 | Disposition: A | Discharge status: Home / Self Care | Payer: Medicare Other | Source: Ambulatory Visit | Attending: Internal Medicine | Admitting: Internal Medicine

## 2017-02-15 ENCOUNTER — Non-Acute Institutional Stay (SKILLED_NURSING_FACILITY): Payer: Medicare Other | Admitting: Gerontology

## 2017-02-15 DIAGNOSIS — F329 Major depressive disorder, single episode, unspecified: Secondary | ICD-10-CM

## 2017-02-15 DIAGNOSIS — G8929 Other chronic pain: Secondary | ICD-10-CM | POA: Diagnosis not present

## 2017-02-15 DIAGNOSIS — R351 Nocturia: Secondary | ICD-10-CM | POA: Diagnosis not present

## 2017-02-15 DIAGNOSIS — K219 Gastro-esophageal reflux disease without esophagitis: Secondary | ICD-10-CM | POA: Diagnosis not present

## 2017-02-15 DIAGNOSIS — G20 Parkinson's disease: Secondary | ICD-10-CM

## 2017-02-15 DIAGNOSIS — I10 Essential (primary) hypertension: Secondary | ICD-10-CM | POA: Diagnosis not present

## 2017-02-15 DIAGNOSIS — I82402 Acute embolism and thrombosis of unspecified deep veins of left lower extremity: Secondary | ICD-10-CM | POA: Diagnosis not present

## 2017-02-15 DIAGNOSIS — G3184 Mild cognitive impairment, so stated: Secondary | ICD-10-CM

## 2017-02-15 DIAGNOSIS — G20A1 Parkinson's disease without dyskinesia, without mention of fluctuations: Secondary | ICD-10-CM

## 2017-02-15 DIAGNOSIS — J309 Allergic rhinitis, unspecified: Secondary | ICD-10-CM | POA: Diagnosis not present

## 2017-02-15 DIAGNOSIS — I442 Atrioventricular block, complete: Secondary | ICD-10-CM

## 2017-02-15 DIAGNOSIS — E538 Deficiency of other specified B group vitamins: Secondary | ICD-10-CM | POA: Diagnosis not present

## 2017-02-15 DIAGNOSIS — K5909 Other constipation: Secondary | ICD-10-CM | POA: Diagnosis not present

## 2017-02-16 DIAGNOSIS — K219 Gastro-esophageal reflux disease without esophagitis: Secondary | ICD-10-CM | POA: Insufficient documentation

## 2017-02-16 NOTE — Progress Notes (Signed)
Location:      Place of Service:  SNF (31) Provider:  Lorenso Quarry, NP-C  [name], MD  Patient Care Team: [name], MD as PCP - General (Family Medicine)  Extended Emergency Contact Information Primary Emergency Contact: Marva Panda [place] Home Phone: [phone] Mobile Phone: [phone] Relation: Spouse Secondary Emergency Contact: Gwynneth Munson [place] Mobile Phone: [phone] Relation: Son  Code Status:  Dnr Goals of care: Advanced Directive information Advanced Directives 09/08/2016  Does Patient Have a Medical Advance Directive? Yes  Type of Advance Directive -  Does patient want to make changes to medical advance directive? -  Copy of Healthcare Power of Attorney in Chart? -  Would patient like information on creating a medical advance directive? -  Pre-existing out of facility DNR order (yellow form or pink MOST form) -     Chief Complaint  Patient presents with  . Medical Management of Chronic Issues    HPI:  Pt is a 83 y.o. male seen today for medical management of chronic diseases. Pt reports he has been doing well. He reports his pain is well controlled at this point with scheduled medications. He is using minimal prn, breakthrough meds. His Parkinson's symptoms are controlled without medications. He does report occasional non-threatening, visual hallucinations. However, he says he does not want anything done about them, ie. No new "services" that he has to pay for. He denies chest pain or shortness of breath. No known issues with the pacemaker. Pt is being followed by cardiology for the heart block. Blood pressures are stable. No signs of bleeding from the chronic use of Xarelto d/t chronic DVT of Left iliac vein. Pulses equal. Symptoms of chronic allergic rhinitis, reflux and constipation controlled/ stable. Continues Vitamin B12 supplementation due to deficiency. Pt is mobile with wheelchair. Appetite is  good. Having regular BM's. VSS. No other complaints.    Past Medical History:  Diagnosis Date  . Anxiety   . Arthritis   . Blood transfusion    hx of autologus transfusion  . Complete heart block (HCC)   . Depression   . DVT (deep venous thrombosis) (HCC)   . Hypertension   . MCI (mild cognitive impairment) 10/22/2013  . Memory deficit 03/24/2011  . Neuromuscular disorder (HCC)    peripheral  neuropathy  . Pacemaker   . Parkinson's disease (HCC)   . Parkinsonism (HCC) 10/22/2013  . Shortness of breath   . Tremor    Past Surgical History:  Procedure Laterality Date  . CATARACT EXTRACTION  06/2005  . defibrilator  04/04/2012   Dual Chamber  . INSERT / REPLACE / REMOVE PACEMAKER  04/04/2012  . KNEE SURGERY  [phone]  . LEAD REVISION N/A 04/05/2012   Procedure: LEAD REVISION;  Surgeon: Duke Salvia, MD;  Location[facility] LAB;  Service: Cardiovascular;  Laterality: N/A;  . PERMANENT PACEMAKER INSERTION N/A 04/04/2012   Procedure: PERMANENT PACEMAKER INSERTION;  Surgeon: Marinus Maw, MD;  Location: [facility] CATH LAB;  Service: Cardiovascular;  Laterality: N/A;  . REPLACEMENT TOTAL KNEE BILATERAL  1997  . ROTATOR CUFF REPAIR  06/2008  . TOTAL HIP ARTHROPLASTY  2009   right  . UMBILICAL HERNIA REPAIR  2001    Allergies  Allergen Reactions  . Citalopram     Other reaction(s): Unknown  . Codeine Nausea And Vomiting  . Sertraline     Other reaction(s): Unknown Other reaction(s): Unknown  . Lodine [Etodolac] Rash  . Percocet [Oxycodone-Acetaminophen] Rash  Tolerates tylenol Patient is able to take this recently    Allergies as of 02/15/2017      Reactions   Citalopram    Other reaction(s): Unknown   Codeine Nausea And Vomiting   Sertraline    Other reaction(s): Unknown Other reaction(s): Unknown   Lodine [etodolac] Rash   Percocet [oxycodone-acetaminophen] Rash   Tolerates tylenol Patient is able to take this recently      Medication List       Accurate as  of 02/15/17 11:59 PM. Always use your most recent med list.          acetaminophen 325 MG tablet Commonly known as:  TYLENOL Take 650 mg by mouth every 6 (six) hours as needed for moderate pain or fever.   bisacodyl 10 MG suppository Commonly known as:  DULCOLAX Place 10 mg rectally as needed for moderate constipation.   cetirizine 5 MG tablet Commonly known as:  ZYRTEC Take 5 mg by mouth daily.   CVS VITAMIN B12 1000 MCG tablet Generic drug:  cyanocobalamin Take 1,000 mcg by mouth daily.   desvenlafaxine 50 MG 24 hr tablet Commonly known as:  PRISTIQ Take 50 mg by mouth every other day.   docusate sodium 100 MG capsule Commonly known as:  COLACE Take 100 mg by mouth 2 (two) times daily.   FLONASE 50 MCG/ACT nasal spray Generic drug:  fluticasone Place into both nostrils daily.   fluticasone 27.5 MCG/SPRAY nasal spray Commonly known as:  VERAMYST Place 2 sprays into the nose daily.   gabapentin 100 MG capsule Commonly known as:  NEURONTIN Take 100 mg by mouth at bedtime.   HYDROcodone-acetaminophen 5-325 MG tablet Commonly known as:  NORCO/VICODIN Take 1 tablet by mouth every 6 (six) hours as needed for moderate pain.   ipratropium-albuterol 0.5-2.5 (3) MG/3ML Soln Commonly known as:  DUONEB Take 3 mLs by nebulization 3 (three) times daily as needed.   lisinopril 40 MG tablet Commonly known as:  PRINIVIL,ZESTRIL TAKE ONE TABLET BY MOUTH EVERY DAY   magnesium hydroxide 400 MG/5ML suspension Commonly known as:  MILK OF MAGNESIA Take by mouth daily as needed for mild constipation.   sodium phosphate enema Place 1 enema rectally daily as needed. follow package directions   tamsulosin 0.4 MG Caps capsule Commonly known as:  FLOMAX Take 0.4 mg by mouth.   XARELTO 20 MG Tabs tablet Generic drug:  rivaroxaban TAKE 1 TABLET BY MOUTH EVERY EVENING   ZINC OXIDE PO Take by mouth daily as needed.       Review of Systems  Constitutional: Negative for  activity change, appetite change, chills, diaphoresis, fatigue and fever.  HENT: Negative.   Eyes: Negative.   Respiratory: Negative for cough, choking, chest tightness, shortness of breath and wheezing.   Cardiovascular: Negative for chest pain (pressure), palpitations and leg swelling.  Gastrointestinal: Negative for abdominal distention, abdominal pain, constipation, diarrhea and nausea.  Genitourinary: Negative for difficulty urinating, flank pain and frequency.  Musculoskeletal: Positive for arthralgias, back pain, gait problem and joint swelling.  Skin: Negative.   Psychiatric/Behavioral: Negative.   All other systems reviewed and are negative.    There is no immunization history on file for this patient. Pertinent  Health Maintenance Due  Topic Date Due  . PNA vac Low Risk Adult (1 of 2 - PCV13) 03/05/2001  . INFLUENZA VACCINE  07/12/2016   No flowsheet data found. Functional Status Survey:    Vitals:   02/15/17 0740  BP: 138/90  Pulse:  60  Resp: 18  Temp: 98.3 F (36.8 C)  SpO2: 98%   There is no height or weight on file to calculate BMI. Physical Exam  Constitutional: He is oriented to person, place, and time. Vital signs are normal. He appears well-developed and well-nourished. He is active and cooperative. He does not appear ill. No distress.  HENT:  Head: Normocephalic and atraumatic.  Mouth/Throat: Uvula is midline, oropharynx is clear and moist and mucous membranes are normal. Mucous membranes are not pale, not dry and not cyanotic.  Eyes: Conjunctivae, EOM and lids are normal. Pupils are equal, round, and reactive to light.  Neck: Trachea normal, normal range of motion and full passive range of motion without pain. Neck supple. No JVD present. No tracheal deviation, no edema and no erythema present. No thyromegaly present.  Cardiovascular: Normal rate, regular rhythm, normal heart sounds, intact distal pulses and normal pulses.  Exam reveals no gallop, no  distant heart sounds and no friction rub.   No murmur heard. Pulmonary/Chest: Effort normal and breath sounds normal. No accessory muscle usage. No respiratory distress. He has no wheezes. He has no rales. He exhibits no tenderness.  Abdominal: Soft. Normal appearance and bowel sounds are normal. He exhibits no distension and no ascites. There is no tenderness.  Musculoskeletal: He exhibits no edema.       Left knee: He exhibits decreased range of motion. Tenderness found.  Expected osteoarthritis, stiffness, weakness  Neurological: He is alert and oriented to person, place, and time. He has normal strength.  Skin: Skin is warm, dry and intact. No rash noted. He is not diaphoretic. No cyanosis or erythema. No pallor. Nails show no clubbing.  Psychiatric: He has a normal mood and affect. His speech is normal and behavior is normal. Judgment and thought content normal. Cognition and memory are normal.  Nursing note and vitals reviewed.   Labs reviewed:  Recent Labs  05/18/16 1046 09/08/16 1500  NA 138 141  K 3.7 4.3  CL 104 109  CO2 23 26  GLUCOSE 93 102*  BUN 20 18  CREATININE 0.90 0.96  CALCIUM 9.3 9.2    Recent Labs  05/18/16 1046 09/08/16 1500  AST 18 16  ALT 14* 12*  ALKPHOS 80 68  BILITOT 1.1 0.7  PROT 6.7 6.6  ALBUMIN 4.4 4.3    Recent Labs  05/18/16 1520 05/27/16 1030 09/08/16 1500  WBC 21.2* 6.9 6.8  NEUTROABS 18.4* 4.0 3.7  HGB 13.5 12.3* 13.8  HCT 40.8 36.5* 40.1  MCV 97.9 95.0 96.1  PLT 155 178 138*   Lab Results  Component Value Date   TSH 0.892 05/18/2016   No results found for: HGBA1C No results found for: CHOL, HDL, LDLCALC, LDLDIRECT, TRIG, CHOLHDL  Significant Diagnostic Results in last 30 days:  No results found.  Assessment/Plan 1. Chronic allergic rhinitis, unspecified seasonality, unspecified trigger  Cetirizine 5 mg po Q HS  Fluticasone 50 mcg- 2 sprays each nostril Q day  2. Vitamin B12 deficiency  Cyanocobalamin 1000 mcg  po Q Day  3. Constipation, chronic  Colace 100 mg po BID  Milk of Magnesia 30 ml PO Q 4 hours prn  4. Other chronic pain  Gabapentin 100 mg po BID  Hydrocodone/APAP 5/325 1/2-1 tablet po Q 4 hours during waking hours  Hydrocodone/APAP 5/325 1/2-1 tablet po Q 4 hours prn  RX renewal written #120, no refill  Tylenol 650 mg po QID prn  5. Essential hypertension  Stable  Continue  Lisinopril 40 mg po Q Day  6. Deep vein thrombosis (DVT) of left lower extremity, unspecified chronicity, unspecified vein (HCC)  Stable  Continue Xarelto 20 mg po Q day  7. MCI (mild cognitive impairment)  Stable  8. Major depressive disorder with single episode, remission status unspecified  Stable  Continue Pristiq (desvenlafaxine succinate) tablet extended release 24 hr; 50 mg every other day  9. Complete heart block [facility])  Pacemaker  Continue periodic follow up with Cardiology for management  10. Parkinson's disease (HCC)  Stable  No medications needed at this point for symptom management  11. Acid reflux  Ranitidine 75 mg PO Q day  12. Nocturia  Stable  Continue Flomax 0.4 mg PO Q HS  Family/ staff Communication:   Total Time:  Documentation:  Face to Face:  Family/Phone:   Labs/tests ordered:    Medication list reviewed and assessed for continued appropriateness. Monthly medication orders reviewed and signed.  Brynda Rim, NP-C Geriatrics [facility] [phone] [address] [place], [place] [address] Cell Phone (Mon-Fri 8am-5pm):  [phone] On Call:  [phone] & follow prompts after 5pm & weekends Office Phone:  [phone] Office Fax:  [phone]

## 2017-03-08 ENCOUNTER — Other Ambulatory Visit: Payer: Self-pay | Admitting: Hematology and Oncology

## 2017-03-09 ENCOUNTER — Inpatient Hospital Stay (HOSPITAL_BASED_OUTPATIENT_CLINIC_OR_DEPARTMENT_OTHER): Payer: Medicare Other | Admitting: Hematology and Oncology

## 2017-03-09 ENCOUNTER — Encounter: Payer: Self-pay | Admitting: Hematology and Oncology

## 2017-03-09 ENCOUNTER — Inpatient Hospital Stay: Payer: Medicare Other | Attending: Hematology and Oncology

## 2017-03-09 VITALS — BP 106/64 | HR 80 | Temp 97.5°F | Resp 18 | Ht 70.0 in | Wt 208.0 lb

## 2017-03-09 DIAGNOSIS — Z86718 Personal history of other venous thrombosis and embolism: Secondary | ICD-10-CM | POA: Insufficient documentation

## 2017-03-09 DIAGNOSIS — Z950 Presence of cardiac pacemaker: Secondary | ICD-10-CM | POA: Insufficient documentation

## 2017-03-09 DIAGNOSIS — D649 Anemia, unspecified: Secondary | ICD-10-CM

## 2017-03-09 DIAGNOSIS — Z79899 Other long term (current) drug therapy: Secondary | ICD-10-CM | POA: Diagnosis not present

## 2017-03-09 DIAGNOSIS — G629 Polyneuropathy, unspecified: Secondary | ICD-10-CM | POA: Diagnosis not present

## 2017-03-09 DIAGNOSIS — F329 Major depressive disorder, single episode, unspecified: Secondary | ICD-10-CM | POA: Insufficient documentation

## 2017-03-09 DIAGNOSIS — E538 Deficiency of other specified B group vitamins: Secondary | ICD-10-CM

## 2017-03-09 DIAGNOSIS — Z7901 Long term (current) use of anticoagulants: Secondary | ICD-10-CM

## 2017-03-09 DIAGNOSIS — I10 Essential (primary) hypertension: Secondary | ICD-10-CM | POA: Insufficient documentation

## 2017-03-09 DIAGNOSIS — G20 Parkinson's disease: Secondary | ICD-10-CM | POA: Diagnosis not present

## 2017-03-09 DIAGNOSIS — F419 Anxiety disorder, unspecified: Secondary | ICD-10-CM | POA: Insufficient documentation

## 2017-03-09 DIAGNOSIS — I82402 Acute embolism and thrombosis of unspecified deep veins of left lower extremity: Secondary | ICD-10-CM

## 2017-03-09 DIAGNOSIS — I825Z2 Chronic embolism and thrombosis of unspecified deep veins of left distal lower extremity: Secondary | ICD-10-CM

## 2017-03-09 LAB — CBC WITH DIFFERENTIAL/PLATELET
Basophils Absolute: 0.1 10*3/uL (ref 0–0.1)
Basophils Relative: 1 %
Eosinophils Absolute: 0.7 10*3/uL (ref 0–0.7)
Eosinophils Relative: 10 %
HCT: 38.7 % — ABNORMAL LOW (ref 40.0–52.0)
Hemoglobin: 13.1 g/dL (ref 13.0–18.0)
Lymphocytes Relative: 28 %
Lymphs Abs: 1.8 10*3/uL (ref 1.0–3.6)
MCH: 33.3 pg (ref 26.0–34.0)
MCHC: 34 g/dL (ref 32.0–36.0)
MCV: 97.9 fL (ref 80.0–100.0)
Monocytes Absolute: 0.3 10*3/uL (ref 0.2–1.0)
Monocytes Relative: 5 %
Neutro Abs: 3.5 10*3/uL (ref 1.4–6.5)
Neutrophils Relative %: 56 %
Platelets: 152 10*3/uL (ref 150–440)
RBC: 3.95 MIL/uL — ABNORMAL LOW (ref 4.40–5.90)
RDW: 13.4 % (ref 11.5–14.5)
WBC: 6.3 10*3/uL (ref 3.8–10.6)

## 2017-03-09 LAB — COMPREHENSIVE METABOLIC PANEL
ALT: 13 U/L — ABNORMAL LOW (ref 17–63)
AST: 17 U/L (ref 15–41)
Albumin: 4 g/dL (ref 3.5–5.0)
Alkaline Phosphatase: 79 U/L (ref 38–126)
Anion gap: 6 (ref 5–15)
BUN: 15 mg/dL (ref 6–20)
CO2: 25 mmol/L (ref 22–32)
Calcium: 8.9 mg/dL (ref 8.9–10.3)
Chloride: 107 mmol/L (ref 101–111)
Creatinine, Ser: 0.94 mg/dL (ref 0.61–1.24)
GFR calc Af Amer: >60 mL/min (ref >60)
GFR calc non Af Amer: >60 mL/min (ref >60)
Glucose, Bld: 136 mg/dL — ABNORMAL HIGH (ref 65–99)
Potassium: 4.1 mmol/L (ref 3.5–5.1)
Sodium: 138 mmol/L (ref 135–145)
Total Bilirubin: 0.7 mg/dL (ref 0.3–1.2)
Total Protein: 6.4 g/dL — ABNORMAL LOW (ref 6.5–8.1)

## 2017-03-09 LAB — VITAMIN B12: Vitamin B-12: 1051 pg/mL — ABNORMAL HIGH (ref 180–914)

## 2017-03-09 LAB — FERRITIN: Ferritin: 88 ng/mL (ref 24–336)

## 2017-03-09 NOTE — Progress Notes (Signed)
[facility] day:  03/09/2017   Chief Complaint: Randy [name] is a 83 y.o. male with a history of left lower extremity deep venous thrombosis (DVT) who is seen for 6 month assessment on Xarelto.  HPI: The patient was last seen in the medical oncology clinic on 09/08/2016.  At that time, his mobility remains limited.  Exam revealed chronic lower extremity changes.  He remained on Xarelto.  During the interim, he has been walking 2 times a day.  He hasskilled nursing. He uses a gait belt. He remains debilitated. He spends majority of his day watching TV or reading.  He needs help with his activities of daily living.  He has been followed by Dr. Brigitte Pulse, who has tried several medications. He is off his Parkinson's medications.  He denies any bruising or bleeding.      Past Medical History:  Diagnosis Date  . Anxiety   . Arthritis   . Blood transfusion    hx of autologus transfusion  . Complete heart block ([place])   . Depression   . DVT (deep venous thrombosis) ([place])   . Hereditary and idiopathic neuropathy   . Hypertension   . MCI (mild cognitive impairment) 10/22/2013  . Memory deficit 03/24/2011  . Neuromuscular disorder ([place])    peripheral  neuropathy  . Pacemaker   . Parkinson's disease ([place])   . Parkinsonism ([place]) 10/22/2013  . Shortness of breath   . Spinal stenosis   . Tremor     Past Surgical History:  Procedure Laterality Date  . CATARACT EXTRACTION  06/2005  . defibrilator  04/04/2012   Dual Chamber  . INSERT / REPLACE / REMOVE PACEMAKER  04/04/2012  . KNEE SURGERY  [phone]  . LEAD REVISION N/A 04/05/2012   Procedure: LEAD REVISION;  Surgeon: Deboraha Sprang, MD;  Location: [facility] CATH LAB;  Service: Cardiovascular;  Laterality: N/A;  . PERMANENT PACEMAKER INSERTION N/A 04/04/2012   Procedure: PERMANENT PACEMAKER INSERTION;  Surgeon: Evans Lance, MD;  Location: [facility] CATH LAB;  Service: Cardiovascular;  Laterality: N/A;   . REPLACEMENT TOTAL KNEE BILATERAL  1997  . ROTATOR CUFF REPAIR  06/2008  . TOTAL HIP ARTHROPLASTY  2009   right  . UMBILICAL HERNIA REPAIR  2001    Family History  Problem Relation Age of Onset  . Hypertension Mother   . Hypertension Father   . Seizures Father     epilepsy  . Prostate cancer Other   . Cancer Other   . Hypertension Other   . Asthma Other     Social History:  reports that he quit smoking about 44 years ago. His smoking use included Cigarettes. He has a 18.00 pack-year smoking history. He has never used smokeless tobacco. He reports that he drinks alcohol. He reports that he does not use drugs.  He lives in [place]. They live in [place].  The patient is accompanied by his wife today.  Allergies:  Allergies  Allergen Reactions  . Citalopram     Other reaction(s): Unknown  . Codeine Nausea And Vomiting  . Sertraline     Other reaction(s): Unknown Other reaction(s): Unknown  . Lodine [Etodolac] Rash  . Percocet [Oxycodone-Acetaminophen] Rash    Tolerates tylenol Patient is able to take this recently    Current Medications: Current Outpatient Prescriptions  Medication Sig Dispense Refill  . acetaminophen (TYLENOL) 325 MG tablet Take 650 mg by mouth every 6 (six) hours as needed for  moderate pain or fever.    . carbidopa-levodopa (SINEMET IR) 25-100 MG tablet Take 2 tablets by mouth 3 (three) times daily. For parkinson's    . cetirizine (ZYRTEC) 5 MG tablet Take 5 mg by mouth daily.    . cyanocobalamin ([employer]S VITAMIN B12) 1000 MCG tablet Take 1,000 mcg by mouth daily.    Marland Kitchen desvenlafaxine (PRISTIQ) 50 MG 24 hr tablet Take 50 mg by mouth every other day.     . docusate sodium (COLACE) 100 MG capsule Take 100 mg by mouth 2 (two) times daily.    . fluticasone (FLONASE) 50 MCG/ACT nasal spray Place into both nostrils daily.    Marland Kitchen gabapentin (NEURONTIN) 100 MG capsule Take 100 mg by mouth at bedtime.    Marland Kitchen HYDROcodone-acetaminophen (NORCO/VICODIN) 5-325  MG tablet Take 1 tablet by mouth every 6 (six) hours as needed for moderate pain.    Marland Kitchen lisinopril (PRINIVIL,ZESTRIL) 40 MG tablet TAKE ONE TABLET BY MOUTH EVERY DAY 30 tablet 3  . ranitidine (ZANTAC) 75 MG tablet Take 1 tablet by mouth daily.    . tamsulosin (FLOMAX) 0.4 MG CAPS capsule Take 0.4 mg by mouth.    Alveda Reasons 20 MG TABS tablet TAKE 1 TABLET BY MOUTH EVERY EVENING 30 tablet 1  . magnesium hydroxide (MILK OF MAGNESIA) 400 MG/5ML suspension Take by mouth daily as needed for mild constipation.    . sodium phosphate (FLEET) enema Place 1 enema rectally daily as needed. follow package directions    . ZINC OXIDE PO Take by mouth daily as needed.     No current facility-administered medications for this visit.     Review of Systems:  GENERAL:  Limited mobility.  Weak.  No fevers or sweats.  Weight down 4 pounds. PERFORMANCE STATUS (ECOG):  3 HEENT:  No visual changes, runny nose, sore throat, mouth sores or tenderness. Lungs: No shortness of breath or cough.  No hemoptysis. Cardiac:  No chest pain, palpitations, orthopnea, or PND. GI:  Eating well.  No nausea, vomiting, diarrhea, constipation, melena or hematochezia. GU:  No urgency, frequency, dysuria, or hematuria. Musculoskeletal:  Back and hip pain (positional).  No muscle tenderness. Extremities:  No pain or swelling. Skin:  No rashes or skin changes. Neuro:  Decreased ability to walk.  No headache, numbness, balance or coordination issues. Endocrine:  No diabetes, thyroid issues, hot flashes or night sweats. Psych:  No mood changes, depression or anxiety.  Days and nights mixed up. Pain:  No focal pain. Review of systems:  All other systems reviewed and found to be negative.  Physical Exam: Blood pressure 106/64, pulse 80, temperature 97.5 F (36.4 C), temperature source Tympanic, resp. rate 18, height 5' 10"  (1.778 m), weight 208 lb (94.3 kg). GENERAL:  Well developed, well nourished, gentleman sitting comfortably in a  wheelchair in the exam room in no acute distress.  He a gait belt on.  His head is down. MENTAL STATUS:  Alert and oriented to person, place and time. HEAD:  Thin white hair.  Normocephalic, atraumatic, face symmetric, no Cushingoid features. EYES:  Glasses.  Blue eyes.  Pupils equal round and reactive to light and accomodation.  No conjunctivitis or scleral icterus. ENT:  Oropharynx clear without lesion.  Tongue normal. Mucous membranes moist.  RESPIRATORY:  Clear to auscultation without rales, wheezes or rhonchi. CARDIOVASCULAR:  Regular rate and rhythm without murmur, rub or gallop. ABDOMEN:  Soft, non-tender, with active bowel sounds, and no hepatosplenomegaly.  No masses. SKIN:  No rashes,  ulcers or lesions. EXTREMITIES:  Bilateral trace to 1+ lower extremity edema.  No skin discoloration or tenderness.  No palpable cords. LYMPH NODES:  No palpable cervical, supraclavicular, axillary or inguinal adenopathy  NEUROLOGICAL:  Unremarkable. PSYCH:  Appropriate.   Appointment on 03/09/2017  Component Date Value Ref Range Status  . WBC 03/09/2017 6.3  3.8 - 10.6 K/uL Final  . RBC 03/09/2017 3.95* 4.40 - 5.90 MIL/uL Final  . Hemoglobin 03/09/2017 13.1  13.0 - 18.0 g/dL Final  . HCT 03/09/2017 38.7* 40.0 - 52.0 % Final  . MCV 03/09/2017 97.9  80.0 - 100.0 fL Final  . MCH 03/09/2017 33.3  26.0 - 34.0 pg Final  . MCHC 03/09/2017 34.0  32.0 - 36.0 g/dL Final  . RDW 03/09/2017 13.4  11.5 - 14.5 % Final  . Platelets 03/09/2017 152  150 - 440 K/uL Final  . Neutrophils Relative % 03/09/2017 56  % Final  . Neutro Abs 03/09/2017 3.5  1.4 - 6.5 K/uL Final  . Lymphocytes Relative 03/09/2017 28  % Final  . Lymphs Abs 03/09/2017 1.8  1.0 - 3.6 K/uL Final  . Monocytes Relative 03/09/2017 5  % Final  . Monocytes Absolute 03/09/2017 0.3  0.2 - 1.0 K/uL Final  . Eosinophils Relative 03/09/2017 10  % Final  . Eosinophils Absolute 03/09/2017 0.7  0 - 0.7 K/uL Final  . Basophils Relative 03/09/2017 1  %  Final  . Basophils Absolute 03/09/2017 0.1  0 - 0.1 K/uL Final  . Sodium 03/09/2017 138  135 - 145 mmol/L Final  . Potassium 03/09/2017 4.1  3.5 - 5.1 mmol/L Final  . Chloride 03/09/2017 107  101 - 111 mmol/L Final  . CO2 03/09/2017 25  22 - 32 mmol/L Final  . Glucose, Bld 03/09/2017 136* 65 - 99 mg/dL Final  . BUN 03/09/2017 15  6 - 20 mg/dL Final  . Creatinine, Ser 03/09/2017 0.94  0.61 - 1.24 mg/dL Final  . Calcium 03/09/2017 8.9  8.9 - 10.3 mg/dL Final  . Total Protein 03/09/2017 6.4* 6.5 - 8.1 g/dL Final  . Albumin 03/09/2017 4.0  3.5 - 5.0 g/dL Final  . AST 03/09/2017 17  15 - 41 U/L Final  . ALT 03/09/2017 13* 17 - 63 U/L Final  . Alkaline Phosphatase 03/09/2017 79  38 - 126 U/L Final  . Total Bilirubin 03/09/2017 0.7  0.3 - 1.2 mg/dL Final  . GFR calc non Af Amer 03/09/2017 >60  >60 mL/min Final  . GFR calc Af Amer 03/09/2017 >60  >60 mL/min Final   Comment: (NOTE) The eGFR has been calculated using the CKD EPI equation. This calculation has not been validated in all clinical situations. eGFR's persistently <60 mL/min signify possible Chronic Kidney Disease.   . Anion gap 03/09/2017 6  5 - 15 Final  . Ferritin 03/09/2017 88  24 - 336 ng/mL Final    Assessment:  Randy [name] is a 83 y.o. male with Parkinson's and a history of left lower extremity DVT in the setting of immobility and possible dehydration in 09/2014.  He remains on Xarelto secondary to his persistent immobility.  Hypercoagulable work-up on 03/17/2015 revealed the following normal studies:  Factor V Leiden, prothrombin gene mutation, lupus anticoagulant, and anticardiolipin antibodies.  He has no evidence of a myeloproliferative disorder.    Left lower extremity duplex on 03/17/2015 revealed interval improvement in the left femoral popliteal DVT.  Thrombus was nonocclusive.    He has thrombocytopenia possibly related to Mebetiq (?).  Platelet count ranges between 141,000 and 172,000.  He has a  history of a mild normocytic anemia.  Diet is good.  B12 was low normal on 02/11/2016.  MMA was normal on 09/08/2016 thus r/o B12 deficiency.  He denies any melena or hematochezia.  Ferritin has been followed:  151 on 02/11/2016, 179 on 05/27/2016, 76 on 09/08/2016, and 88 on 03/09/2017.  Symptomatically, his mobility remains very limited.  Exam reveals chronic lower extremity changes.  Ferritin is 88.  Plan: 1.  Labs today:  CBC with diff, CMP, ferritin, B12. 2.  Continue Xarelto. 3.  RTC in 6 months for MD assessment and labs (CBC with diff, CMP, ferritin).   Lequita Asal, MD  03/09/2017, 3:41 PM

## 2017-03-09 NOTE — Progress Notes (Signed)
Patient here for follow-up with Dr. Merlene Pullingorcoran. He has h/o of DVT. Currently resides at [facility]. c/o generalized muscle spasms. Has not had his scheduled dosing of norco today before leaving the facility.

## 2017-03-12 ENCOUNTER — Encounter
Admission: RE | Admit: 2017-03-12 | Discharge: 2017-03-12 | Disposition: A | Discharge status: Home / Self Care | Payer: Medicare Other | Source: Ambulatory Visit | Attending: Internal Medicine | Admitting: Internal Medicine

## 2017-03-22 ENCOUNTER — Non-Acute Institutional Stay (SKILLED_NURSING_FACILITY): Payer: Medicare Other | Admitting: Gerontology

## 2017-03-22 DIAGNOSIS — G3184 Mild cognitive impairment, so stated: Secondary | ICD-10-CM | POA: Diagnosis not present

## 2017-03-22 DIAGNOSIS — G8929 Other chronic pain: Secondary | ICD-10-CM | POA: Diagnosis not present

## 2017-03-22 DIAGNOSIS — J309 Allergic rhinitis, unspecified: Secondary | ICD-10-CM

## 2017-03-22 DIAGNOSIS — R351 Nocturia: Secondary | ICD-10-CM

## 2017-03-22 DIAGNOSIS — K219 Gastro-esophageal reflux disease without esophagitis: Secondary | ICD-10-CM

## 2017-03-22 DIAGNOSIS — I442 Atrioventricular block, complete: Secondary | ICD-10-CM | POA: Diagnosis not present

## 2017-03-22 DIAGNOSIS — G20 Parkinson's disease: Secondary | ICD-10-CM | POA: Diagnosis not present

## 2017-03-22 DIAGNOSIS — F329 Major depressive disorder, single episode, unspecified: Secondary | ICD-10-CM

## 2017-03-22 DIAGNOSIS — E538 Deficiency of other specified B group vitamins: Secondary | ICD-10-CM | POA: Diagnosis not present

## 2017-03-22 DIAGNOSIS — K5909 Other constipation: Secondary | ICD-10-CM

## 2017-03-22 DIAGNOSIS — I82402 Acute embolism and thrombosis of unspecified deep veins of left lower extremity: Secondary | ICD-10-CM

## 2017-03-22 DIAGNOSIS — I10 Essential (primary) hypertension: Secondary | ICD-10-CM

## 2017-04-03 NOTE — Progress Notes (Signed)
Location:      Place of Service:  SNF (31) Provider:  Lorenso Quarry, NP-C  [name], MD  Patient Care Team: [name], MD as PCP - General (Family Medicine)  Extended Emergency Contact Information Primary Emergency Contact: Marva Panda [place] of [place] Home Phone: [phone] Mobile Phone: [phone] Relation: Spouse Secondary Emergency Contact: Gwynneth Munson [place] Mobile Phone: [phone] Relation: Son  Code Status:  Dnr Goals of care: Advanced Directive information Advanced Directives 03/09/2017  Does Patient Have a Medical Advance Directive? Yes  Type of Estate agent of [place];Living will  Does patient want to make changes to medical advance directive? No - Patient declined  Copy of Healthcare Power of Attorney in Chart? No - copy requested  Would patient like information on creating a medical advance directive? -  Pre-existing out of facility DNR order (yellow form or pink MOST form) -     Chief Complaint  Patient presents with  . Medical Management of Chronic Issues    HPI:  Pt is a 83 y.o. male seen today for medical management of chronic diseases. Pt reports he has been doing well. He reports his pain is well controlled at this point with scheduled medications. He is using minimal prn, breakthrough meds. His Parkinson's symptoms are no longer controlled without medications. He does report occasional non-threatening, visual hallucinations. However, he says he does not want anything done about them, ie. No new "services" that he has to pay for. However, he did see Neuro on 3/2. He denies chest pain or shortness of breath. C/O tension headaches that was addressed by Neuro. No known issues with the pacemaker. Pt is being followed by cardiology for the heart block. Blood pressures are stable. No signs of bleeding from the chronic use of Xarelto d/t chronic DVT of Left iliac vein. Pulses equal. Symptoms of  chronic allergic rhinitis, reflux and constipation controlled/ stable. Continues Vitamin B12 supplementation due to deficiency. Pt is mobile with wheelchair. Appetite is good. Having regular BM's. VSS. No other complaints.    Past Medical History:  Diagnosis Date  . Anxiety   . Arthritis   . Blood transfusion    hx of autologus transfusion  . Complete heart block ([facility])   . Depression   . DVT (deep venous thrombosis) ([facility])   . Hereditary and idiopathic neuropathy   . Hypertension   . MCI (mild cognitive impairment) 10/22/2013  . Memory deficit 03/24/2011  . Neuromuscular disorder ([facility])    peripheral  neuropathy  . Pacemaker   . Parkinson's disease ([facility])   . Parkinsonism ([facility]) 10/22/2013  . Shortness of breath   . Spinal stenosis   . Tremor    Past Surgical History:  Procedure Laterality Date  . CATARACT EXTRACTION  06/2005  . defibrilator  04/04/2012   Dual Chamber  . INSERT / REPLACE / REMOVE PACEMAKER  04/04/2012  . KNEE SURGERY  [phone]  . LEAD REVISION N/A 04/05/2012   Procedure: LEAD REVISION;  Surgeon: Duke Salvia, MD;  Location: [facility] CATH LAB;  Service: Cardiovascular;  Laterality: N/A;  . PERMANENT PACEMAKER INSERTION N/A 04/04/2012   Procedure: PERMANENT PACEMAKER INSERTION;  Surgeon: Marinus Maw, MD;  Location: [facility] CATH LAB;  Service: Cardiovascular;  Laterality: N/A;  . REPLACEMENT TOTAL KNEE BILATERAL  1997  . ROTATOR CUFF REPAIR  06/2008  . TOTAL HIP ARTHROPLASTY  2009   right  . UMBILICAL HERNIA REPAIR  2001    Allergies  Allergen Reactions  .  Citalopram     Other reaction(s): Unknown  . Codeine Nausea And Vomiting  . Sertraline     Other reaction(s): Unknown Other reaction(s): Unknown  . Lodine [Etodolac] Rash  . Percocet [Oxycodone-Acetaminophen] Rash    Tolerates tylenol Patient is able to take this recently    Allergies as of 03/22/2017      Reactions   Citalopram    Other reaction(s): Unknown   Codeine Nausea And Vomiting   Sertraline     Other reaction(s): Unknown Other reaction(s): Unknown   Lodine [etodolac] Rash   Percocet [oxycodone-acetaminophen] Rash   Tolerates tylenol Patient is able to take this recently      Medication List       Accurate as of 03/22/17 11:59 PM. Always use your most recent med list.          acetaminophen 325 MG tablet Commonly known as:  TYLENOL Take 650 mg by mouth every 6 (six) hours as needed for moderate pain or fever.   carbidopa-levodopa 25-100 MG tablet Commonly known as:  SINEMET IR Take 2 tablets by mouth 3 (three) times daily. For parkinson's   cetirizine 5 MG tablet Commonly known as:  ZYRTEC Take 5 mg by mouth daily.   CVS VITAMIN B12 1000 MCG tablet Generic drug:  cyanocobalamin Take 1,000 mcg by mouth daily.   desvenlafaxine 50 MG 24 hr tablet Commonly known as:  PRISTIQ Take 50 mg by mouth every other day.   docusate sodium 100 MG capsule Commonly known as:  COLACE Take 100 mg by mouth 2 (two) times daily.   FLONASE 50 MCG/ACT nasal spray Generic drug:  fluticasone Place into both nostrils daily.   gabapentin 100 MG capsule Commonly known as:  NEURONTIN Take 100 mg by mouth at bedtime.   HYDROcodone-acetaminophen 5-325 MG tablet Commonly known as:  NORCO/VICODIN Take 1 tablet by mouth every 6 (six) hours as needed for moderate pain.   lisinopril 40 MG tablet Commonly known as:  PRINIVIL,ZESTRIL TAKE ONE TABLET BY MOUTH EVERY DAY   magnesium hydroxide 400 MG/5ML suspension Commonly known as:  MILK OF MAGNESIA Take by mouth daily as needed for mild constipation.   ranitidine 75 MG tablet Commonly known as:  ZANTAC Take 1 tablet by mouth daily.   sodium phosphate enema Place 1 enema rectally daily as needed. follow package directions   tamsulosin 0.4 MG Caps capsule Commonly known as:  FLOMAX Take 0.4 mg by mouth.   XARELTO 20 MG Tabs tablet Generic drug:  rivaroxaban TAKE 1 TABLET BY MOUTH EVERY EVENING   ZINC OXIDE PO Take by  mouth daily as needed.       Review of Systems  Constitutional: Negative for activity change, appetite change, chills, diaphoresis, fatigue and fever.  HENT: Negative.   Eyes: Negative.   Respiratory: Negative for cough, choking, chest tightness, shortness of breath and wheezing.   Cardiovascular: Negative for chest pain (pressure), palpitations and leg swelling.  Gastrointestinal: Negative for abdominal distention, abdominal pain, constipation, diarrhea and nausea.  Genitourinary: Negative for difficulty urinating, flank pain and frequency.  Musculoskeletal: Positive for arthralgias, back pain, gait problem and joint swelling.  Skin: Negative.   Psychiatric/Behavioral: Negative.   All other systems reviewed and are negative.   Immunization History  Administered Date(s) Administered  . Pneumococcal Polysaccharide-23 03/13/2014   Pertinent  Health Maintenance Due  Topic Date Due  . PNA vac Low Risk Adult (2 of 2 - PCV13) 03/14/2015  . INFLUENZA VACCINE  07/12/2017  No flowsheet data found. Functional Status Survey:    Vitals:   03/20/17 2300  BP: (!) 149/75  Pulse: 61  Resp: 20  Temp: 98 F (36.7 C)  SpO2: 94%   There is no height or weight on file to calculate BMI. Physical Exam  Constitutional: He is oriented to person, place, and time. Vital signs are normal. He appears well-developed and well-nourished. He is active and cooperative. He does not appear ill. No distress.  HENT:  Head: Normocephalic and atraumatic.  Mouth/Throat: Uvula is midline, oropharynx is clear and moist and mucous membranes are normal. Mucous membranes are not pale, not dry and not cyanotic.  Eyes: Conjunctivae, EOM and lids are normal. Pupils are equal, round, and reactive to light.  Neck: Trachea normal, normal range of motion and full passive range of motion without pain. Neck supple. No JVD present. No tracheal deviation, no edema and no erythema present. No thyromegaly present.    Cardiovascular: Normal rate, regular rhythm, normal heart sounds, intact distal pulses and normal pulses.  Exam reveals no gallop, no distant heart sounds and no friction rub.   No murmur heard. Pulmonary/Chest: Effort normal and breath sounds normal. No accessory muscle usage. No respiratory distress. He has no wheezes. He has no rales. He exhibits no tenderness.  Abdominal: Soft. Normal appearance and bowel sounds are normal. He exhibits no distension and no ascites. There is no tenderness.  Musculoskeletal: He exhibits no edema.       Left knee: He exhibits decreased range of motion. Tenderness found.  Expected osteoarthritis, stiffness, weakness  Neurological: He is alert and oriented to person, place, and time. He has normal strength.  Skin: Skin is warm, dry and intact. No rash noted. He is not diaphoretic. No cyanosis or erythema. No pallor. Nails show no clubbing.  Psychiatric: He has a normal mood and affect. His speech is normal and behavior is normal. Judgment and thought content normal. Cognition and memory are normal.  Nursing note and vitals reviewed.   Labs reviewed:  Recent Labs  05/18/16 1046 09/08/16 1500 03/09/17 1408  NA 138 141 138  K 3.7 4.3 4.1  CL 104 109 107  CO2 GLUCOSE 93 102* 136*  BUN CREATININE 0.90 0.96 0.94  CALCIUM 9.3 9.2 8.9    Recent Labs  05/18/16 1046 09/08/16 1500 03/09/17 1408  AST ALT 14* 12* 13*  ALKPHOS 80 68 79  BILITOT 1.1 0.7 0.7  PROT 6.7 6.6 6.4*  ALBUMIN 4.4 4.3 4.0    Recent Labs  05/27/16 1030 09/08/16 1500 03/09/17 1408  WBC 6.9 6.8 6.3  NEUTROABS 4.0 3.7 3.5  HGB 12.3* 13.8 13.1  HCT 36.5* 40.1 38.7*  MCV 95.0 96.1 97.9  PLT 178 138* 152   Lab Results  Component Value Date   TSH 0.892 05/18/2016   No results found for: HGBA1C No results found for: CHOL, HDL, LDLCALC, LDLDIRECT, TRIG, CHOLHDL  Significant Diagnostic Results in last 30 days:  No results  found.  Assessment/Plan 1. Chronic allergic rhinitis, unspecified seasonality, unspecified trigger  Cetirizine 5 mg po Q HS  Fluticasone 50 mcg- 2 sprays each nostril Q day  2. Vitamin B12 deficiency  DC Cyanocobalamin 1000 mcg po Q Day  Cyanocobalamin 500 mcg po Q Day  3. Constipation, chronic  Colace 100 mg po BID  Milk of Magnesia 30 ml PO Q 4 hours prn  4. Other chronic pain  Gabapentin  100 mg po BID  Hydrocodone/APAP 5/325 1/2-1 tablet po Q 4 hours during waking hours  Hydrocodone/APAP 5/325 1/2-1 tablet po Q 4 hours prn  RX renewal written #120, no refill  Tylenol 650 mg po QID prn  5. Essential hypertension  Stable  Continue Lisinopril 40 mg po Q Day  6. Deep vein thrombosis (DVT) of left lower extremity, unspecified chronicity, unspecified vein (HCC)  Stable  Continue Xarelto 20 mg po Q day  7. MCI (mild cognitive impairment)  Stable  8. Major depressive disorder with single episode, remission status unspecified  Stable  Continue Pristiq (desvenlafaxine succinate) tablet extended release 24 hr; 50 mg every other day  9. Complete heart block [facility])  Pacemaker  Continue periodic follow up with Cardiology for management  10. Parkinson's disease (HCC)  Stable  Was started on Carbidopa-Levodopa 25-100 mg- 2 tablets po TID, continue  11. Acid reflux  Ranitidine 75 mg PO Q day  12. Nocturia  Stable  Continue Flomax 0.4 mg PO Q HS  Family/ staff Communication:   Total Time:  Documentation:  Face to Face:  Family/Phone:   Labs/tests ordered:  Not due  Medication list reviewed and assessed for continued appropriateness. Monthly medication orders reviewed and signed.  Brynda Rim, NP-C Geriatrics [facility] [phone] [place] [place], [place] [address] Cell Phone (Mon-Fri 8am-5pm):  [phone] On Call:  [phone] & follow prompts after 5pm & weekends Office Phone:  [phone] Office  Fax:  [phone]

## 2017-04-11 ENCOUNTER — Encounter
Admission: RE | Admit: 2017-04-11 | Discharge: 2017-04-11 | Disposition: A | Discharge status: Home / Self Care | Payer: Medicare Other | Source: Ambulatory Visit | Attending: Internal Medicine | Admitting: Internal Medicine

## 2017-04-13 DIAGNOSIS — F325 Major depressive disorder, single episode, in full remission: Secondary | ICD-10-CM | POA: Insufficient documentation

## 2017-04-26 ENCOUNTER — Ambulatory Visit: Payer: Medicare Other | Attending: Neurology | Admitting: Physical Therapy

## 2017-04-26 ENCOUNTER — Ambulatory Visit (INDEPENDENT_AMBULATORY_CARE_PROVIDER_SITE_OTHER): Payer: Medicare Other | Admitting: *Deleted

## 2017-04-26 ENCOUNTER — Telehealth: Payer: Self-pay | Admitting: Cardiology

## 2017-04-26 DIAGNOSIS — I442 Atrioventricular block, complete: Secondary | ICD-10-CM

## 2017-04-26 DIAGNOSIS — M6281 Muscle weakness (generalized): Secondary | ICD-10-CM | POA: Insufficient documentation

## 2017-04-26 DIAGNOSIS — R262 Difficulty in walking, not elsewhere classified: Secondary | ICD-10-CM

## 2017-04-26 NOTE — Therapy (Addendum)
[place] MAIN [facility] SERVICES [address] [place], [place], [address] Phone: [phone]   Fax:  [phone]  Physical Therapy Wheelchair Evaluation  Patient Details  Name: Randy [name] MRN: [id number] Date of Birth: [birth year] Dr. Jennings Books- Referring Provider  Encounter Date: 04/26/2017      PT End of Session - 04/26/17 1511    Visit Number 1   Number of Visits 1   Date for PT Re-Evaluation 04/26/17   PT Start Time 1300   PT Stop Time 1435   PT Time Calculation (min) 95 min   Equipment Utilized During Treatment Gait belt   Activity Tolerance Patient limited by pain   Behavior During Therapy [facility] for tasks assessed/performed      Past Medical History:  Diagnosis Date  . Anxiety   . Arthritis   . Blood transfusion    hx of autologus transfusion  . Complete heart block ([place])   . Depression   . DVT (deep venous thrombosis) ([place])   . Hereditary and idiopathic neuropathy   . Hypertension   . MCI (mild cognitive impairment) 10/22/2013  . Memory deficit 03/24/2011  . Neuromuscular disorder ([place])    peripheral  neuropathy  . Pacemaker   . Parkinson's disease ([place])   . Parkinsonism ([place]) 10/22/2013  . Shortness of breath   . Spinal stenosis   . Tremor     Past Surgical History:  Procedure Laterality Date  . CATARACT EXTRACTION  06/2005  . defibrilator  04/04/2012   Dual Chamber  . INSERT / REPLACE / REMOVE PACEMAKER  04/04/2012  . KNEE SURGERY  [phone]  . LEAD REVISION N/A 04/05/2012   Procedure: LEAD REVISION;  Surgeon: Deboraha Sprang, MD;  Location: [facility] CATH LAB;  Service: Cardiovascular;  Laterality: N/A;  . PERMANENT PACEMAKER INSERTION N/A 04/04/2012   Procedure: PERMANENT PACEMAKER INSERTION;  Surgeon: Evans Lance, MD;  Location: [facility] CATH LAB;  Service: Cardiovascular;  Laterality: N/A;  . REPLACEMENT TOTAL KNEE BILATERAL  1997  . ROTATOR CUFF REPAIR  06/2008  . TOTAL HIP ARTHROPLASTY  2009   right  .  UMBILICAL HERNIA REPAIR  2001    There were no vitals filed for this visit.     PATIENT INFORMATION: This Evaluation form will serve as the LMN for the following suppliers:  [place] Person: Luz Brazen, [employer] Phone: [phone]   Reason for Referral: Patient interested in getting a power wheelchair to help with mobility;  Patient/caregiver Goals: Patient is hoping to get a new power chair for mobility;  Patient was seen for face-to-face evaluation for new power wheelchair.  Also present was    [employer], [employer]  to discuss recommendations and wheelchair options.  Further paperwork was completed and sent to vendor.  Patient appears to qualify for manual mobility device at this time per objective findings.   MEDICAL HISTORY:  Diagnosis: Peripheral Neuropathy, Parkinson's disease, history of old CVA Primary Diagnosis Onset: 2011 _0 Progressive Disease Relevant Past and Future Surgeries:Multiple knee surgeries [phone]; in 1997 underwent bilateral total knee replacement; Shoulder surgery (2009), Hip replacement (2010), Hammer Toe Surgery (2012), Pacemaker placement (2013) Height:5'11" Weight: 208# Explain and recent changes or trends in weight:no recent changes  Relevant History including falls: Patient has chronic neck pain related to arthritis; Patient reports falling back on footboard about 6 weeks ago when he was trying to walk around bed to sit down; He did require assistance to get up off the footboard.  HOME ENVIRONMENT: _0 House  _1 Condo/town home  _2 Apartment  _3 Assisted Living   X-Skilled Nursing Facility ([place])  _4 Lives Alone _5  Lives with Others                                                    Hours with caregiver: 24/7 care as needed;   _6 Home is accessible to patient            Stairs      _7 Yes _8  No     Ramp _9 Yes _10 No Comments:  Level entry to [employer]; lives on 3rd floor, Media planner to transport   COMMUNITY  ADL: TRANSPORTATION: _11 Car    _12 Van    <ZOXWRUEAVWUJWJXB>_1<\/YNWGNFAOZHYQMVHQ>_46 Public Transportation    _14 Adapted w/c Lift   _15 Ambulance   _16 Other: [employer] has Engineer, water for power wheelchairs _17 Sits in wheelchair during transport  Employment/School:  Retired Specific requirements pertaining to mobility                                                     Other:                                          FUNCTIONAL/SENSORY PROCESSING SKILLS:  Handedness:   _18 Right     _19 Left    _20 NA  Comments:                                 Media planner for [employer] _21 Processing Skills are adequate for safe wheelchair operation  Areas of concern than may interfere with safe operation of wheelchair Description of problem   _22  Attention to environment     _23 Judgment     _24  Hearing  _25  Vision or visual processing    _26 Motor Planning  _27  Fluctuations in Behavior              Limited Cervical ROM (rotation 20 degrees each direction)                                     VERBAL COMMUNICATION: _28 WFL receptive _29  WFL expressive _30 Understandable  _31 Difficult to understand  _32 non-communicative _33  Uses an augmented communication device  CURRENT SEATING / MOBILITY: Current Mobility Base:   _34 None  _35 Dependent  _36 Manual  _37 Scooter  _38 Power   Type of Control:                       Manufacturer: [employer] Tracer              Size:                         Age: on loan from the church                          Current Condition of Mobility Base: working okay, locks appropriately  Current Wheelchair components:  Has had to replace foot plates on wheelchair and the swing away foot rests do not move well and do not lock correctly;                                                                                                                                  Describe posture in present  seating system:  Sits with forward head and moderate slumped posture; knee is higher than hips;                                                                            SENSATION and SKIN ISSUES: Sensation _0 Intact  _1 Impaired  _2 Absent   Level of sensation:    Has neuropathy but intact light touch sensation in BLE; does have episodes of burning pain intermittent                       Pressure Relief: Able to perform effective pressure relief :   _3 Yes  _4  No Method: shifts weight side/side; can stand from chair;                                                                              If not, Why?:                                                                          Skin Issues/Skin Integrity Current Skin Issues   _5 Yes _6 No  _7 Intact _8  Red area _9  Open Area  _10 Scar Tissue _11 At risk from prolonged sitting  Where                              History of Skin Issues   _12 Yes _13 No Where                                         When  Hx of skin flap surgeries _0 Yes _1 No Where                                              When                                                  Limited sitting tolerance _2 Yes _3 No Hours spent sitting in wheelchair daily: 2 hours                                                        Complaint of Pain:  Please describe:  No pain with prolonged sitting due to pain medication; 4/10 pain currently in neck/hips and knees;                                          Swelling/Edema: None reported;                                                                                                                                                                        ADL STATUS (in reference to wheelchair use):  Indep Assist Unable Indep with Equip Not assessed Comments  Dressing              X                                        Could do some dressing but very difficult and  needs a lot of help;                    Eating       X  Toileting               X                                                                                                                Bathing               X                                               sometimes uses a shower chair;                                  Grooming/ Hygiene  X                                                        uses electric shaver                              Meal Prep                             X                                                                                             IADLS               X                                             uses walker                                                      Bowel Management: _0 Continent  _1 Incontinent  _2 Accidents Comments:  Bladder Management: _0 Continent  _1 Incontinent  _2 Accidents Comments:                                              WHEELCHAIR SKILLS: Manual w/c Propulsion: _3 UE or LE strength and endurance sufficient to participate in ADLs using manual wheelchair Arm :  _4 left _5 right   _6 Both                                   Foot:   _7 left _8 right   _9 Both  Distance: 150; HR: 88, SPO2 93% on linoleum; no difficulty propelling with BLE; will fatigue with prolonged distance or incline;  Operate Scooter: _10  Strength, hand grip, balance and transfer appropriate for use _11 Living environment is accessible for use of scooter  Operate Power w/c:  _12  Std. Joystick   _13  Alternative Controls Indep _14  Assist _15  Dependent/ unable _16  N/A _17   _18 Safe          _19  Functional      Distance:               Bed confined without wheelchair _20  Yes _21  No   STRENGTH/RANGE OF MOTION:  Range of Motion Strength  Shoulder       WFL                                                       4/5                                                Elbow      WFL                                                       4/5                                                  Wrist/Hand       WFL                                                                     4/5                                                           Hip   Decreased hip flexion  4-/5                                                    Knee   Decreased knee extension, functional                                                        4/5                                                        Ankle       WFL           4/5                                                          MOBILITY/BALANCE:  _0  Patient is totally dependent for mobility                                                                                               Balance Transfers Ambulation  Sitting Balance: Standing Balance: _1  Independent _2  Independent/Modified Independent  _3  WFL     _4  WFL _5  Supervision _6  Supervision  _7  Uses UE for balance  _8  Supervision _9  Min Assist _10  Ambulates with Assist                           _11  Min Assist _12  Min assist _13  Mod Assist _14  Ambulates with Device:   _15  RW  _16  StW   _17  Cane   _18                 _19  Mod Assist _20  Mod assist _21  Max assist   _22  Max Assist _23  Max assist _24  Dependent _25  Indep. Short Distance Only  _26  Unable _27  Unable _28  Lift / Sling Required Distance (in feet)           30 feet ( 0.15 m/s with RW, CGA- high fall risk, dependent for mobility)                _29  Sliding board _30  Unable to Ambulate: (Explain:  Cardio Status:  _31 Intact  _32  Impaired   _33  NA                              Respiratory Status:  _34 Intact   _35 Impaired   _36 NA  Orthotics/Prosthetics:    none                                                                     Comments (Address manual vs power w/c vs scooter):   Patient able to  propel self in manual wheelchair with BLE >150 feet with good cardiovascular response. He does have fatigue in LE and discomfort in knee/hip; He also is very tall and current manual wheelchair does not fit him appropriately allowing increased thoracic kyphosis and decreased lumbar lordosis; When sitting in his current wheelchair, has knees higher than hips when sitting with feet on leg rests; Patient would be able to operate a power chair safely; He is able to walk a short distance, but is a high fall risk and unsafe. Requires CGA for safety and limited to 30 feet (0.15 m/s gait speed) using RW; He does need a wheelchair for mobility;                                                    Anterior / Posterior Obliquity Rotation-Pelvis  PELVIS    _0 Neutral  _1  Posterior  _2  Anterior     _3  _4  _5   WFL Rt elev Lt elev _6 WFL  _7 Right Anterior     Left Anterior    _8  Fixed _9  Partly Flexible _10  Flexible  _11  Other  _12  Fixed  _13  Partly Flexible  _14  Flexible _15  Other  _16  Fixed  _17  Partly Flexible  _18  Flexible _19  Other  TRUNK _20 WFL _21 Thoracic Kyphosis _22 Lumbar Lordosis   _23  WFL _24 Convex Right _25 Convex Left   _26 c-curve _27 s-curve _28 multiple  _29  Neutral _30  Left-anterior _31  Right-anterior    _32  Fixed _33  Flexible _34  Partly Flexible       Other  _35  Fixed _36  Flexible _37  Partly Flexible _38  Other  _39  Fixed           _40  Flexible _41  Partly Flexible _42  Other   Position Windswept   HIPS  _43  Neutral _44  Abduct _45  ADduct _46  Neutral _47  Right _48  Left       _49  Fixed  _50  Partly Flexible                _51  Dislocated _52  Flexible _53  Subluxed  _54  Fixed _55  Partly Flexible  _56  Flexible _57  Other              Foot Positioning Knee Positioning   Knees and  Feet  _58  WFL _59 Left _60 Right _61  WFL _62 Left _63 Right   KNEES ROM concerns: ROM concerns:   & Dorsi-Flexed _64 Lt _65 Rt   Does have tightness in knees with slight knee flexion contracture (approximately 5-10  degrees each LE)                               FEET Plantar Flexed _66 Lt _67 Rt     Inversion                 _68 Lt _69 Rt     Eversion                 _70   Lt _0 Rt    HEAD _1  Functional _2  Good Head Control   & _3  Flexed         _4  Extended _5  Adequate Head Control   NECK _6  Rotated  Lt  _7  Lat Flexed Lt _8  Rotated  Rt _9  Lat Flexed Rt _10  Limited Head Control Does have stiffness in neck with minimal neck rotation (20 degrees each direction)   _11  Cervical Hyperextension _12  Absent  Head Control    SHOULDERS ELBOWS WRIST& HAND         Left     Right    Left     Right  U/E _13 Functional  Left         _14 Functional  Right                                 _15 Fisting             _16 Fisting     _17 elevated Left _18 depressed  Left _19 elevated Right _20 depressed  Right      _21 protracted Left _22 retracted Left _23 protracted Right _24 retracted Right _25 subluxed  Left           _26 subluxed  Right         Goals for Wheelchair Mobility  _27  Independence with mobility in the home with motor related ADLs (MRADLs)  _28  Independence with MRADLs in the community _29  Provide dependent mobility  _30  Provide recline     _31 Provide tilt   Goals for Seating system _32  Optimize pressure distribution _33  Provide support needed to facilitate function or safety _34  Provide corrective forces to assist with maintaining or improving posture _35  Accommodate client's posture: current seated postures and positions are not flexible or will not tolerate corrective forces _36  Client to be independent with relieving pressure in the wheelchair _37 Enhance physiological function such as breathing, swallowing, digestion  Simulation ideas/Equipment trials:                                                                                 Was able to propel self in manual wheelchair using BLE only, see above;                State why other equipment was unsuccessful:                                                                                  MOBILITY BASE RECOMMENDATIONS and JUSTIFICATION: MOBILITY COMPONENT JUSTIFICATION  Manufacturer: Ultralight weight manual wheelchair    Model:              Size: Width           Seat Depth             _38 provide transport from point A to B _39 promote Indep mobility  _40 is not a safe, functional ambulator [  x]walker or cane inadequate _0 non-standard width/depth necessary to accommodate anatomical measurement _1                             _2 Manual Mobility Base _3 non-functional ambulator    _4 Scooter/POV  _5 can safely operate  _6 can safely transfer   _7 has adequate trunk stability  _8 cannot functionally propel manual w/c  _9 Power Mobility Base  _10 non-ambulatory  _11 cannot functionally propel manual wheelchair  _12  cannot functionally and safely operate scooter/POV _13 can safely operate and willing to  _14 Stroller Base _15 infant/child  _16 unable to propel manual wheelchair _17 allows for growth _18 non-functional ambulator _19 non-functional UE _20 Indep mobility is not a goal at this time  _21 Tilt  _22 Forward _23 Backward _24 Powered tilt  _25 Manual tilt  _26 change position against gravitational force on head and shoulders  _27 change position for pressure relief/cannot weight shift _28 transfers  _29 management of tone _30 rest periods _31 control edema _32 facilitate postural control  _33                                       _34 Recline  _35 Power recline on power base _36 Manual recline on manual base  _37 accommodate femur to back angle  _38 bring to full recline for ADL care  _39 change position for pressure relief/cannot weight shift _40 rest periods _41 repositioning for transfers or clothing/diaper /catheter changes _42 head positioning  _43 Lighter weight required _44 self- propulsion  _45 lifting _46                                                 _47 Heavy Duty required _48 user weight greater than 250# _49 extreme tone/ over active movement _50 broken frame on previous chair _51                                      _52  Back  _53  Angle Adjustable (acta relief) _54  Custom molded                           _55 postural control _56 control of tone/spasticity _57 accommodation of range of motion _58 UE functional control _59 accommodation for seating system _60                                          _61 provide lateral trunk support _62 accommodate deformity _63 provide posterior trunk support _64 provide lumbar/sacral support _65 support trunk in midline _66 Pressure relief over spinal processes  _67  Seat Cushion                       _68 impaired sensation  _69 decubitus ulcers present _70 history of pressure ulceration _71 prevent pelvic extension _72 low maintenance  _73 stabilize pelvis  _74 accommodate obliquity _75 accommodate multiple deformity _76 neutralize lower extremity position _77 increase pressure distribution _78                                           _79  Pelvic/thigh support  _80  Lateral thigh guide _81  Distal medial pad  _82  Distal lateral pad _83  pelvis in neutral _84 accommodate pelvis _85  position upper legs _86  alignment _87   accommodate ROM _0  decrease adduction _1 accommodate tone _2 removable for transfers _3 decrease abduction  _4  Lateral trunk Supports _5  Lt     _6  Rt _7 decrease lateral trunk leaning _8 control tone _9 contour for increased contact _10 safety  _11 accommodate asymmetry _12                                                 _13  Mounting hardware  _14 lateral trunk supports   _15 back   _16 seat _17 headrest      _18  thigh support _19 fixed   _20 swing away _21 attach seat platform/cushion to w/c frame _22 attach back cushion to w/c frame _23 mount postural supports _24 mount headrest  _25 swing medial thigh support away _26 swing lateral supports away for transfers  _27                                                     Armrests  _28 fixed _29 adjustable height _30 removable   _31 swing away  _32 flip back   _33 reclining _34 full length pads _35 desk    _36 pads tubular  _37 provide support with elbow at 90   _38 provide  support for w/c tray _39 change of height/angles for variable activities _40 remove for transfers _41 allow to come closer to table top _42 remove for access to tables _43                                               Hangers/ Leg rests  _44 60 _45 70 _46 90 _47 elevating _48 heavy duty  _49 articulating _50 fixed _51 lift off _52 swing away     _53 power _54 provide LE support  _55 accommodate to hamstring tightness _56 elevate legs during recline   _57 provide change in position for Legs _58 Maintain placement of feet on footplate _59 durability _60 enable transfers _61 decrease edema _62 Accommodate lower leg length _63                                         Foot support Footplate    <NWGNFAOZHYQMVHQI>_6<\/NGEXBMWUXLKGMWNU>_27 Lt  _65  Rt  _66  Center mount _67 flip up     _68 depth/angle adjustable _69 Amputee adapter    _70  Lt     _71  Rt _72 provide foot support _73 accommodate to ankle ROM _74 transfers _75 Provide support for residual extremity _76  allow foot to go under wheelchair base _77  decrease tone  _78                                                 _79  Ankle strap/heel loops _80 support foot on foot support _81 decrease extraneous movement _82 provide input to heel  _83 protect foot  Tires: _84 pneumatic  _85 flat free inserts  _86 solid  _87 decrease maintenance  _88 prevent frequent flats _89 increase shock absorbency _90 decrease pain from road shock _91 decrease spasms from road shock _92                                              _93   Headrest  _0 provide posterior head support _1 provide posterior neck support _2 provide lateral head support _3 provide anterior head support _4 support during tilt and recline _5 improve feeding   _6 improve respiration _7 placement of switches _8 safety  _9 accommodate ROM  _10 accommodate tone _11 improve visual orientation  _12  Anterior chest strap _13  Vest _14  Shoulder retractors  _15 decrease forward movement of shoulder _16 accommodation of TLSO _17 decrease forward movement of trunk _18 decrease shoulder elevation _19 added abdominal  support _20 alignment _21 assistance with shoulder control  _22                                               Pelvic Positioner _23 Belt _24 SubASIS bar _25 Dual Pull _26 stabilize tone _27 decrease falling out of chair/ **will not Decrease potential for sliding due to pelvic tilting _28 prevent excessive rotation _29 pad for protection over boney prominence _30 prominence comfort _31 special pull angle to control rotation _32                                                  Upper ExtremitySupport _33 L   _34  R _35 Arm trough    _36 hand support _37  tray       _38 full tray _39 swivel mount _40 decrease edema      _41 decrease subluxation   _42 control tone   _43 placement for AAC/Computer/EADL _44 decrease gravitational pull on shoulders _45 provide midline positioning _46 provide support to increase UE function _47 provide hand support in natural position _48 provide work surface   POWER WHEELCHAIR CONTROLS  _49 Proportional  _50 Non-Proportional Type                                      _51 Left  _52 Right _53 provides access for controlling wheelchair   _54 lacks motor control to operate proportional drive control <VWUJWJXBJYNWGNFA>_2<\/ZHYQMVHQIONGEXBM>_84 unable to understand proportional controls  Actuator Control Module  _56 Single  _57 Multiple   _58 Allow the client to operate the power seat function(s) through the joystick control   _59 Safety Reset Switches _60 Used to change modes and stop the wheelchair when driving in latch mode    _61 [employer]   _62 programming for accurate control _63 progressive Disease/changing condition _64 non-proportional drive control needed _65 Needed in order to operate power seat functions through joystick control   _66 Display box _67 Allows user to see in which mode and drive the wheelchair is set  _68 necessary for alternate controls    _69 Digital interface electronics _70 Allows w/c to operate when using alternative drive controls  <XLKGMWNUUVOZDGUY>_4<\/IHKVQQVZDGLOVFIE>_33 ASL Head Array _72 Allows client to operate wheelchair  through switches placed in tri-panel headrest  _73 Sip and puff  with tubing kit _74 needed to operate sip and puff drive controls  <IRJJOACZYSAYTKZS>_0<\/FUXNATFTDDUKGURK>_27 Upgraded tracking electronics _76 increase safety when driving <CWCBJSEGBTDVVOHY>_0<\/VPXTGGYIRSWNIOEV>_03 correct tracking when on uneven surfaces  _78 [facility] for switches or joystick _79 Attaches switches to w/c  _80 Swing away for access or transfers _81 midline for optimal placement _82 provides for consistent access  _83 Attendant controlled joystick plus mount _84 safety _85 long distance driving <JKKXFGHWEXHBZJIR>_6<\/VELFYBOFBPZWCHEN>_27 operation of seat functions _87 compliance with transportation regulations _88                                             Rear wheel placement/Axle adjustability _89 None _90 semi adjustable _91 fully  adjustable  _0 improved UE access to wheels _1 improved stability _2 changing angle in space for improvement of postural stability _3 1-arm drive access <ZOXWRUEAVWUJWJXB>_1<\/YNWGNFAOZHYQMVHQ>_4 amputee pad placement _5                                Wheel rims/ hand rims  _6 metal  _7 plastic coated _8 oblique projections _9 vertical projections _10 Provide ability to propel manual wheelchair  _11  Increase self-propulsion with hand weakness/decreased grasp  Push handles _12 extended  _13 angle adjustable  _14 standard _15 caregiver access _16 caregiver assist _17 allows "hooking" to enable increased ability to perform ADLs or maintain balance  One armed device  _18 Lt   _19 Rt _20 enable propulsion of manual wheelchair with one arm   _21                                            Brake/wheel lock extension _22  Lt   _23  Rt _24 increase indep in applying wheel locks   _25 Side guards _26 prevent clothing getting caught in wheel or becoming soiled _27  prevent skin tears/abrasions  Battery:                                            _28 to power wheelchair                                                         Other:                                   Anti-tippers on back of chair;    Reduce fall risk;                                                      The above equipment has a life- long use expectancy. Growth and changes in medical and/or functional conditions  would be the exceptions. This is to certify that the therapist has no financial relationship with durable medical provider or manufacturer. The therapist will not receive remuneration of any kind for the equipment recommended in this evaluation.   Patient has mobility limitation that significantly impairs safe, timely participation in one or more mobility related ADL's. (bathing, toileting, feeding, dressing, grooming, moving from room to room)  _29  Yes _30  No  Will mobility device sufficiently improve ability to participate and/or be aided in participation of MRADL's?      _31  Yes _32  No  Can limitation be compensated for with use of a cane or walker?                                       _33  Yes _34  No  Does patient or caregiver demonstrate ability/potential ability & willingness to safely use the mobility device?    _35  Yes _36  No  Does patient's home  environment support use of recommended mobility device?            _0  Yes _1  No  Does patient have sufficient upper extremity function necessary to functionally propel a manual wheelchair?     _2  Yes _3  No  Does patient have sufficient strength and trunk stability to safely operate a POV (scooter)?                                  _4  Yes _5  No  Does patient need additional features/benefits provided by a power wheelchair for MRADL's in the home?        _6  Yes _7  No  Does the patient demonstrate the ability to safely use a power wheelchair?                   _8  Yes _9  No     Physician's Name Printed:   Dr. Jennings Books                                                     Physician's Signature:  Date:     This is to certify that I, the above signed therapist have the following affiliations: _10  This DME provider _11  Manufacturer of recommended equipment _12  Patient's long term care facility _13  None of the above  Therapist Name/Signature: Norwood Levo. Rona Ravens, DPT                                         Date:04/26/17                             PT Education - 04/26/17 1511    Education provided Yes   Education Details recommendations with wheelchair eval   Person(s) Educated Patient   Methods Explanation   Comprehension Verbalized understanding             PT Long Term Goals - 04/26/17 1517      PT LONG TERM GOAL #1   Title Pt and caregivers will understand PT recommendation and appropriate/safe use for wheelchair and seating for home use.   Time 1   Period Days   Status Achieved               Plan - 04/26/17 1514    Clinical Impression Statement 83 yo Male presents to therapy requesting a power wheelchair; see above;    Rehab Potential Fair   Clinical Impairments Affecting Rehab Potential positive: caregiver support at SNF; negative: co-morbidities; Patient's clinical presentation is evolving as he is a high fall risk;    PT Frequency One time visit   PT Treatment/Interventions Patient/family education;Wheelchair mobility training   Consulted and Agree with Plan of Care Patient      Patient will benefit from skilled therapeutic intervention in order to improve the following deficits and impairments:  Abnormal gait, Decreased endurance, Hypomobility, Pain, Increased fascial restricitons, Decreased strength, Decreased knowledge of use of DME, Decreased activity tolerance, Decreased balance, Decreased mobility, Difficulty walking, Improper body mechanics, Decreased range of motion, Decreased safety awareness, Impaired flexibility, Postural dysfunction  Visit Diagnosis: Difficulty in walking, not elsewhere  classified - Plan: PT plan of care cert/re-cert  Muscle weakness (generalized) - Plan: PT plan of care cert/re-cert      G-Codes - 89/21/19 1518    Functional Assessment Tool Used (Outpatient Only) 10 meter walk, clinical judgement   Functional Limitation Mobility: Walking and moving around   Mobility: Walking and Moving Around Current  Status ([id number]) At least 60 percent but less than 80 percent impaired, limited or restricted   Mobility: Walking and Moving Around Goal Status [phone]) At least 60 percent but less than 80 percent impaired, limited or restricted   Mobility: Walking and Moving Around Discharge Status [phone]) At least 60 percent but less than 80 percent impaired, limited or restricted       Problem List Patient Active Problem List   Diagnosis Date Noted  . Acid reflux 02/16/2017  . Allergic rhinitis 02/15/2017  . Vitamin B12 deficiency 02/15/2017  . Constipation, chronic 02/15/2017  . Chronic pain 02/15/2017  . 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 DVT, lower extremity ([place]) 04/27/2015  . Nocturia more than twice per night 09/23/2014  . Lumbar radiculopathy 02/20/2014  . Lumbar canal stenosis 02/11/2014  . MCI (mild cognitive impairment) 10/22/2013  . Abnormal gait 10/07/2013  . Parkinson's disease ([place]) 10/07/2013  . Edema 07/26/2012  . Pacemaker-Medtronic 07/17/2012  . Arthralgia of hip 05/31/2012  . History of repair of hip joint 05/29/2012  . History of knee surgery 05/29/2012  . Chest pain at rest 04/05/2012  . Complete heart block ([place]) 04/03/2012  . HTN (hypertension) 03/24/2011  . Depression 03/24/2011    Abrianna Sidman PT, DPT 04/26/2017, 3:32 PM  [place] [facility] SERVICES [address] [place], [place], [address] Phone: [phone]   Fax:  [phone]  Name: Emre Stock MRN: [id number] Date of Birth: November 07, 1934

## 2017-04-26 NOTE — Telephone Encounter (Signed)
LMOVM reminding pt to send remote transmission.   

## 2017-04-27 ENCOUNTER — Encounter: Payer: Self-pay | Admitting: Cardiology

## 2017-04-27 LAB — CUP PACEART REMOTE DEVICE CHECK
Battery Impedance: 910 Ohm
Battery Remaining Longevity: 60 mo
Battery Voltage: 2.78 V
Brady Statistic AP VP Percent: 40 %
Brady Statistic AP VS Percent: 0 %
Brady Statistic AS VS Percent: 0 %
Date Time Interrogation Session: 20180516193826
Implantable Lead Implant Date: 20130424
Implantable Lead Location: 753859
Implantable Lead Model: 5076
Implantable Lead Model: 5076
Lead Channel Impedance Value: 543 Ohm
Lead Channel Impedance Value: 563 Ohm
Lead Channel Pacing Threshold Amplitude: 0.5 V
Lead Channel Pacing Threshold Pulse Width: 0.4 ms
Lead Channel Setting Pacing Amplitude: 2 V
Lead Channel Setting Pacing Pulse Width: 0.46 ms
MDC IDC LEAD IMPLANT DT: 20130424
MDC IDC LEAD LOCATION: 753860
MDC IDC MSMT LEADCHNL RV PACING THRESHOLD AMPLITUDE: 0.5 V
MDC IDC MSMT LEADCHNL RV PACING THRESHOLD PULSEWIDTH: 0.4 ms
MDC IDC PG IMPLANT DT: 20130424
MDC IDC SET LEADCHNL RA PACING AMPLITUDE: 1.5 V
MDC IDC SET LEADCHNL RV SENSING SENSITIVITY: 4 mV
MDC IDC STAT BRADY AS VP PERCENT: 60 %

## 2017-04-27 NOTE — Progress Notes (Signed)
Letter  

## 2017-04-27 NOTE — Progress Notes (Signed)
Remote pacemaker transmission.   

## 2017-05-01 ENCOUNTER — Non-Acute Institutional Stay (SKILLED_NURSING_FACILITY): Payer: Medicare Other | Admitting: Gerontology

## 2017-05-01 DIAGNOSIS — E538 Deficiency of other specified B group vitamins: Secondary | ICD-10-CM | POA: Diagnosis not present

## 2017-05-01 DIAGNOSIS — F329 Major depressive disorder, single episode, unspecified: Secondary | ICD-10-CM | POA: Diagnosis not present

## 2017-05-01 DIAGNOSIS — G3184 Mild cognitive impairment, so stated: Secondary | ICD-10-CM

## 2017-05-01 DIAGNOSIS — I10 Essential (primary) hypertension: Secondary | ICD-10-CM

## 2017-05-01 DIAGNOSIS — K5909 Other constipation: Secondary | ICD-10-CM

## 2017-05-01 DIAGNOSIS — J309 Allergic rhinitis, unspecified: Secondary | ICD-10-CM | POA: Diagnosis not present

## 2017-05-01 DIAGNOSIS — I442 Atrioventricular block, complete: Secondary | ICD-10-CM | POA: Diagnosis not present

## 2017-05-01 DIAGNOSIS — G8929 Other chronic pain: Secondary | ICD-10-CM

## 2017-05-01 DIAGNOSIS — K219 Gastro-esophageal reflux disease without esophagitis: Secondary | ICD-10-CM

## 2017-05-01 DIAGNOSIS — I82402 Acute embolism and thrombosis of unspecified deep veins of left lower extremity: Secondary | ICD-10-CM

## 2017-05-01 DIAGNOSIS — G20 Parkinson's disease: Secondary | ICD-10-CM | POA: Diagnosis not present

## 2017-05-01 DIAGNOSIS — R351 Nocturia: Secondary | ICD-10-CM

## 2017-05-01 DIAGNOSIS — G20A1 Parkinson's disease without dyskinesia, without mention of fluctuations: Secondary | ICD-10-CM

## 2017-05-01 NOTE — Progress Notes (Signed)
Location:      Place of Service:  SNF (31) Provider:  Lorenso QuarryShannon Lemya Greenwell, NP-C  Jerl MinaHedrick, James, MD  Patient Care Team: Jerl MinaHedrick, James, MD as PCP - General (Family Medicine)  Extended Emergency Contact Information Primary Emergency Contact: Marva PandaGilliam,Polly  [place] Home Phone: [phone] Mobile Phone: [phone] Relation: Spouse Secondary Emergency Contact: Gwynneth MunsonGilliam,Jeffery  [place] Mobile Phone: [phone] Relation: Son  Code Status:  Dnr Goals of care: Advanced Directive information Advanced Directives 04/26/2017  Does Patient Have a Medical Advance Directive? Yes  Type of Estate agentAdvance Directive Healthcare Power of [place];Living will  Does patient want to make changes to medical advance directive? No - Patient declined  Copy of Healthcare Power of Attorney in Chart? Yes  Would patient like information on creating a medical advance directive? -  Pre-existing out of facility DNR order (yellow form or pink MOST form) -     Chief Complaint  Patient presents with  . Medical Management of Chronic Issues    HPI:  Pt is a 83 y.o. male seen today for medical management of chronic diseases. Pt reports he has been doing well. He reports his pain is typically well controlled at this point with scheduled medications. However, pt is sitting very still with a grimaced look on his face. He reports his neck has been stiff and painful as of late. He is using minimal prn, breakthrough meds. His Parkinson's symptoms are no longer controlled without medications. He does report occasional non-threatening, visual hallucinations. However, he says he does not want anything done about them, ie. No new "services" that he has to pay for. He denies chest pain or shortness of breath. C/O tension headaches that was addressed by Neuro. No known issues with the pacemaker. Pt is being followed by cardiology for the heart block. Blood pressures are stable. No signs of bleeding from  the chronic use of Xarelto d/t chronic DVT of Left iliac vein. Pulses equal. Symptoms of chronic allergic rhinitis, reflux and constipation controlled/ stable. Continues Vitamin B12 supplementation due to deficiency. Pt is mobile with wheelchair. Appetite is good. Having regular BM's. VSS. No other complaints.    Past Medical History:  Diagnosis Date  . Anxiety   . Arthritis   . Blood transfusion    hx of autologus transfusion  . Complete heart block (HCC)   . Depression   . DVT (deep venous thrombosis) (HCC)   . Hereditary and idiopathic neuropathy   . Hypertension   . MCI (mild cognitive impairment) 10/22/2013  . Memory deficit 03/24/2011  . Neuromuscular disorder (HCC)    peripheral  neuropathy  . Pacemaker   . Parkinson's disease (HCC)   . Parkinsonism (HCC) 10/22/2013  . Shortness of breath   . Spinal stenosis   . Tremor    Past Surgical History:  Procedure Laterality Date  . CATARACT EXTRACTION  06/2005  . defibrilator  04/04/2012   Dual Chamber  . INSERT / REPLACE / REMOVE PACEMAKER  04/04/2012  . KNEE SURGERY  [phone],1979,1996  . LEAD REVISION N/A 04/05/2012   Procedure: LEAD REVISION;  Surgeon: Duke SalviaSteven C Klein, MD;  [facility];  Service: Cardiovascular;  Laterality: N/A;  . PERMANENT PACEMAKER INSERTION N/A 04/04/2012   Procedure: PERMANENT PACEMAKER INSERTION;  Surgeon: Marinus MawGregg W Taylor, MD;  [facility];  Service: Cardiovascular;  Laterality: N/A;  . REPLACEMENT TOTAL KNEE BILATERAL  1997  . ROTATOR CUFF REPAIR  06/2008  . TOTAL HIP ARTHROPLASTY  2009  right  . UMBILICAL HERNIA REPAIR  2001    Allergies  Allergen Reactions  . Citalopram     Other reaction(s): Unknown  . Codeine Nausea And Vomiting  . Sertraline     Other reaction(s): Unknown Other reaction(s): Unknown  . Lodine [Etodolac] Rash  . Percocet [Oxycodone-Acetaminophen] Rash    Tolerates tylenol Patient is able to take this recently    Allergies as of 05/01/2017      Reactions     Citalopram    Other reaction(s): Unknown   Codeine Nausea And Vomiting   Sertraline    Other reaction(s): Unknown Other reaction(s): Unknown   Lodine [etodolac] Rash   Percocet [oxycodone-acetaminophen] Rash   Tolerates tylenol Patient is able to take this recently      Medication List       Accurate as of 05/01/17  5:59 PM. Always use your most recent med list.          acetaminophen 325 MG tablet Commonly known as:  TYLENOL Take 650 mg by mouth every 6 (six) hours as needed for moderate pain or fever.   carbidopa-levodopa 25-100 MG tablet Commonly known as:  SINEMET IR Take 2 tablets by mouth 3 (three) times daily. For parkinson's   cetirizine 5 MG tablet Commonly known as:  ZYRTEC Take 5 mg by mouth daily.   [employer] VITAMIN B12 1000 MCG tablet Generic drug:  cyanocobalamin Take 1,000 mcg by mouth daily.   desvenlafaxine 50 MG 24 hr tablet Commonly known as:  PRISTIQ Take 50 mg by mouth every other day.   docusate sodium 100 MG capsule Commonly known as:  COLACE Take 100 mg by mouth 2 (two) times daily.   FLONASE 50 MCG/ACT nasal spray Generic drug:  fluticasone Place into both nostrils daily.   gabapentin 100 MG capsule Commonly known as:  NEURONTIN Take 100 mg by mouth at bedtime.   HYDROcodone-acetaminophen 5-325 MG tablet Commonly known as:  NORCO/VICODIN Take 1 tablet by mouth every 6 (six) hours as needed for moderate pain.   lisinopril 40 MG tablet Commonly known as:  PRINIVIL,ZESTRIL TAKE ONE TABLET BY MOUTH EVERY DAY   magnesium hydroxide 400 MG/5ML suspension Commonly known as:  MILK OF MAGNESIA Take by mouth daily as needed for mild constipation.   ranitidine 75 MG tablet Commonly known as:  ZANTAC Take 1 tablet by mouth daily.   sodium phosphate enema Place 1 enema rectally daily as needed. follow package directions   tamsulosin 0.4 MG Caps capsule Commonly known as:  FLOMAX Take 0.4 mg by mouth.   XARELTO 20 MG Tabs  tablet Generic drug:  rivaroxaban TAKE 1 TABLET BY MOUTH EVERY EVENING   ZINC OXIDE PO Take by mouth daily as needed.       Review of Systems  Constitutional: Negative for activity change, appetite change, chills, diaphoresis, fatigue and fever.  HENT: Negative.   Eyes: Negative.   Respiratory: Negative for cough, choking, chest tightness, shortness of breath and wheezing.   Cardiovascular: Negative for chest pain (pressure), palpitations and leg swelling.  Gastrointestinal: Negative for abdominal distention, abdominal pain, constipation, diarrhea and nausea.  Genitourinary: Negative for difficulty urinating, flank pain and frequency.  Musculoskeletal: Positive for arthralgias, back pain, gait problem, joint swelling, neck pain and neck stiffness.  Skin: Negative.   Psychiatric/Behavioral: Negative.   All other systems reviewed and are negative.   Immunization History  Administered Date(s) Administered  . Pneumococcal Polysaccharide-23 03/13/2014   Pertinent  Health Maintenance Due  Topic  Date Due  . PNA vac Low Risk Adult (2 of 2 - PCV13) 03/14/2015  . INFLUENZA VACCINE  07/12/2017   No flowsheet data found. Functional Status Survey:    Vitals:   03/20/17 2330  BP: (!) 149/75  Pulse: 61  Resp: 20  Temp: 98 F (36.7 C)  Weight: 210 lb 12.8 oz (95.6 kg)   Body mass index is 30.25 kg/m. Physical Exam  Constitutional: He is oriented to person, place, and time. Vital signs are normal. He appears well-developed and well-nourished. He is active and cooperative. He does not appear ill. No distress.  HENT:  Head: Normocephalic and atraumatic.  Mouth/Throat: Uvula is midline, oropharynx is clear and moist and mucous membranes are normal. Mucous membranes are not pale, not dry and not cyanotic.  Eyes: Conjunctivae, EOM and lids are normal. Pupils are equal, round, and reactive to light.  Neck: Trachea normal, normal range of motion and full passive range of motion without  pain. Neck supple. No JVD present. No tracheal deviation, no edema and no erythema present. No thyromegaly present.  Cardiovascular: Normal rate, regular rhythm, normal heart sounds, intact distal pulses and normal pulses.  Exam reveals no gallop, no distant heart sounds and no friction rub.   No murmur heard. Pulmonary/Chest: Effort normal and breath sounds normal. No accessory muscle usage. No respiratory distress. He has no wheezes. He has no rales. He exhibits no tenderness.  Abdominal: Soft. Normal appearance and bowel sounds are normal. He exhibits no distension and no ascites. There is no tenderness.  Musculoskeletal: He exhibits no edema.       Left knee: He exhibits decreased range of motion. Tenderness found.  Expected osteoarthritis, stiffness, weakness  Neurological: He is alert and oriented to person, place, and time. He has normal strength.  Skin: Skin is warm, dry and intact. No rash noted. He is not diaphoretic. No cyanosis or erythema. No pallor. Nails show no clubbing.  Psychiatric: He has a normal mood and affect. His speech is normal and behavior is normal. Judgment and thought content normal. Cognition and memory are normal.  Nursing note and vitals reviewed.   Labs reviewed:  Recent Labs  05/18/16 1046 09/08/16 1500 03/09/17 1408  NA 138 141 138  K 3.7 4.3 4.1  CL 104 109 107  CO2 23 26 25   GLUCOSE 93 102* 136*  BUN 20 18 15   CREATININE 0.90 0.96 0.94  CALCIUM 9.3 9.2 8.9    Recent Labs  05/18/16 1046 09/08/16 1500 03/09/17 1408  AST 18 16 17   ALT 14* 12* 13*  ALKPHOS 80 68 79  BILITOT 1.1 0.7 0.7  PROT 6.7 6.6 6.4*  ALBUMIN 4.4 4.3 4.0    Recent Labs  05/27/16 1030 09/08/16 1500 03/09/17 1408  WBC 6.9 6.8 6.3  NEUTROABS 4.0 3.7 3.5  HGB 12.3* 13.8 13.1  HCT 36.5* 40.1 38.7*  MCV 95.0 96.1 97.9  PLT 178 138* 152   Lab Results  Component Value Date   TSH 0.892 05/18/2016   No results found for: HGBA1C No results found for: CHOL, HDL,  LDLCALC, LDLDIRECT, TRIG, CHOLHDL  Significant Diagnostic Results in last 30 days:  No results found.  Assessment/Plan 1. Chronic allergic rhinitis, unspecified seasonality, unspecified trigger  Cetirizine 5 mg po Q HS  Fluticasone 50 mcg- 2 sprays each nostril Q day  2. Vitamin B12 deficiency  DC Cyanocobalamin 1000 mcg po Q Day  Cyanocobalamin 500 mcg po Q Day  3. Constipation, chronic  Colace  100 mg po BID  Milk of Magnesia 30 ml PO Q 4 hours prn  4. Other chronic pain  Gabapentin 100 mg po Q AM  Gabapentin 200 mg po Q HS  Hydrocodone/APAP 5/325 1/2-1 tablet po Q 4 hours during waking hours  Hydrocodone/APAP 5/325 1/2-1 tablet po Q 4 hours prn  RX renewal written #120, no refill  Tylenol 650 mg po QID prn  5. Essential hypertension  Stable  Continue Lisinopril 40 mg po Q Day  6. Deep vein thrombosis (DVT) of left lower extremity, unspecified chronicity, unspecified vein (HCC)  Stable  Continue Xarelto 20 mg po Q day  7. MCI (mild cognitive impairment)  Stable  8. Major depressive disorder with single episode, remission status unspecified  Stable  Continue Pristiq (desvenlafaxine succinate) tablet extended release 24 hr; 50 mg every other day  9. Complete heart block [facility])  Pacemaker  Continue periodic follow up with Cardiology for management  10. Parkinson's disease (HCC)  Stable  Continue Carbidopa-Levodopa 25-100 mg- 2 tablets po TID  Requip 0.25 mg po TID for stiffness, pain  11. Acid reflux  Ranitidine 75 mg PO Q day  12. Nocturia  Stable  Continue Flomax 0.4 mg PO Q HS  Family/ staff Communication:   Total Time:  Documentation:  Face to Face:  Family/Phone:   Labs/tests ordered:  Not due  Medication list reviewed and assessed for continued appropriateness. Monthly medication orders reviewed and signed.  Brynda Rim, NP-C Geriatrics [facility] [phone], [place] [address] Cell Phone (Mon-Fri 8am-5pm):  [phone] On Call:  [phone] & follow prompts after 5pm & weekends Office Phone:  [phone] Office Fax:  [phone]

## 2017-05-09 ENCOUNTER — Other Ambulatory Visit: Payer: Self-pay | Admitting: Hematology and Oncology

## 2017-05-10 NOTE — Telephone Encounter (Signed)
Please advise 

## 2017-05-12 ENCOUNTER — Encounter
Admission: RE | Admit: 2017-05-12 | Discharge: 2017-05-12 | Disposition: A | Discharge status: Home / Self Care | Payer: Medicare Other | Source: Ambulatory Visit | Attending: Internal Medicine | Admitting: Internal Medicine

## 2017-06-11 ENCOUNTER — Encounter
Admission: RE | Admit: 2017-06-11 | Discharge: 2017-06-11 | Disposition: A | Discharge status: Home / Self Care | Payer: Medicare Other | Source: Ambulatory Visit | Attending: Internal Medicine | Admitting: Internal Medicine

## 2017-07-06 ENCOUNTER — Other Ambulatory Visit: Payer: Self-pay

## 2017-07-06 ENCOUNTER — Non-Acute Institutional Stay (SKILLED_NURSING_FACILITY): Payer: Medicare Other | Admitting: Gerontology

## 2017-07-06 DIAGNOSIS — G8929 Other chronic pain: Secondary | ICD-10-CM

## 2017-07-06 DIAGNOSIS — G3184 Mild cognitive impairment, so stated: Secondary | ICD-10-CM | POA: Diagnosis not present

## 2017-07-06 DIAGNOSIS — G4739 Other sleep apnea: Secondary | ICD-10-CM

## 2017-07-06 DIAGNOSIS — J309 Allergic rhinitis, unspecified: Secondary | ICD-10-CM | POA: Diagnosis not present

## 2017-07-06 DIAGNOSIS — R351 Nocturia: Secondary | ICD-10-CM | POA: Diagnosis not present

## 2017-07-06 DIAGNOSIS — M7062 Trochanteric bursitis, left hip: Secondary | ICD-10-CM

## 2017-07-06 DIAGNOSIS — F329 Major depressive disorder, single episode, unspecified: Secondary | ICD-10-CM

## 2017-07-06 DIAGNOSIS — E538 Deficiency of other specified B group vitamins: Secondary | ICD-10-CM | POA: Diagnosis not present

## 2017-07-06 DIAGNOSIS — I10 Essential (primary) hypertension: Secondary | ICD-10-CM

## 2017-07-06 DIAGNOSIS — K219 Gastro-esophageal reflux disease without esophagitis: Secondary | ICD-10-CM

## 2017-07-06 DIAGNOSIS — G20 Parkinson's disease: Secondary | ICD-10-CM | POA: Diagnosis not present

## 2017-07-06 DIAGNOSIS — I442 Atrioventricular block, complete: Secondary | ICD-10-CM | POA: Diagnosis not present

## 2017-07-06 DIAGNOSIS — K5909 Other constipation: Secondary | ICD-10-CM | POA: Diagnosis not present

## 2017-07-06 DIAGNOSIS — I82402 Acute embolism and thrombosis of unspecified deep veins of left lower extremity: Secondary | ICD-10-CM | POA: Diagnosis not present

## 2017-07-06 MED ORDER — HYDROCODONE-ACETAMINOPHEN 5-325 MG PO TABS: ORAL_TABLET | ORAL | 0 refills | Status: DC

## 2017-07-06 NOTE — Telephone Encounter (Signed)
Rx sent to [facility] phone : [phone] , fax : [phone]  

## 2017-07-07 ENCOUNTER — Other Ambulatory Visit: Payer: Self-pay

## 2017-07-07 ENCOUNTER — Encounter: Payer: Self-pay | Admitting: Gerontology

## 2017-07-07 NOTE — Progress Notes (Signed)
Location:   [facility] Room Number: [phone] Place of Service:  SNF [phone]) Provider:  Lorenso Quarry, NP-C  [name], MD  Patient Care Team: [name], MD as PCP - General (Family Medicine)  Extended Emergency Contact Information Primary Emergency Contact: Marva Panda [place] Home Phone: [phone] Mobile Phone: [phone] Relation: Spouse Secondary Emergency Contact: Gwynneth Munson [place] Mobile Phone: [phone] Relation: Son  Code Status:  DNR Goals of care: Advanced Directive information Advanced Directives 07/07/2017  Does Patient Have a Medical Advance Directive? Yes  Type of Advance Directive Out of facility DNR (pink MOST or yellow form)  Does patient want to make changes to medical advance directive? No - Patient declined  Copy of Healthcare Power of Attorney in Chart? -  Would patient like information on creating a medical advance directive? -  Pre-existing out of facility DNR order (yellow form or pink MOST form) -     Chief Complaint  Patient presents with  . Medical Management of Chronic Issues    Routine Visit    HPI:  Pt is a 83 y.o. male seen today for medical management of chronic diseases. Pt reports he has generally been doing well. He does reports his pain is typically not well controlled at this point with scheduled medications. Pt is sitting very still with a grimaced look on his face. He reports his neck has been stiff and painful as well as back and hip pain. He is using minimal prn, breakthrough meds. He is frustrated with his pain, but realizes his family has been limiting the amount of pain medications he takes. We talked about a Palliative Care consult to assist him with these discussions. Pt is agreeable. He is also requesting a visit from the dentist for a cleaning. His Parkinson's symptoms are no longer controlled without medications. He does report occasional  non-threatening, visual hallucinations. However, the parkinson's meds are being managed by his neurologist He denies chest pain or shortness of breath, but reports feeling as though he is choking/ wheezing at night. No cough or congestion associated with this. Pt reports it feels structural. No known issues with the pacemaker. Pt is being followed by cardiology for the heart block. Blood pressures are stable. No signs of bleeding from the chronic use of Xarelto d/t chronic DVT of Left iliac vein. Pulses equal. Symptoms of chronic allergic rhinitis, reflux and constipation controlled/ stable. Continues Vitamin B12 supplementation due to deficiency. Pt is mobile with wheelchair. Appetite is good. Having regular BM's. VSS. No other complaints.    Past Medical History:  Diagnosis Date  . Anxiety   . Arthritis   . Blood transfusion    hx of autologus transfusion  . Cataract cortical, senile   . Chicken pox   . Clotting disorder (HCC)   . Complete heart block (HCC)   . Depression    unspecified  . DVT (deep venous thrombosis) (HCC) 09/2014   left leg  . GERD (gastroesophageal reflux disease)   . Hereditary and idiopathic neuropathy   . History of depression   . Hypertension   . MCI (mild cognitive impairment) 10/22/2013  . Memory deficit 03/24/2011  . Memory loss   . Movement disorder   . Neuromuscular disorder (HCC)    peripheral  neuropathy  . Neuropathy   . Osteoarthritis   . Other hammer toe(s) (acquired), unspecified foot   . Pacemaker   . Parkinson's disease (HCC)   . Parkinsonism (HCC)  10/22/2013  . Shortness of breath   . Spinal stenosis   . Synovitis and tenosynovitis, unspecified   . Third degree heart block (HCC)   . Tremor    Past Surgical History:  Procedure Laterality Date  . CATARACT EXTRACTION  06/2005  . COLONOSCOPY  06/13/2006   PH colon polyps (MUS) repeat 7/20  . CORRECTION HAMMER TOE    . defibrilator  04/04/2012   Dual Chamber  . INSERT / REPLACE / REMOVE  PACEMAKER  04/04/2012  . JOINT REPLACEMENT Right    THA  . JOINT REPLACEMENT Bilateral    TKA  . KNEE ARTHROSCOPY Bilateral    Right x 2 and Left  . KNEE SURGERY  [id number]  . LEAD REVISION N/A 04/05/2012   Procedure: LEAD REVISION;  Surgeon: Duke Salvia, MD;  Location: [facility] CATH LAB;  Service: Cardiovascular;  Laterality: N/A;  . PERMANENT PACEMAKER INSERTION N/A 04/04/2012   Procedure: PERMANENT PACEMAKER INSERTION;  Surgeon: Marinus Maw, MD;  Location: [facility] CATH LAB;  Service: Cardiovascular;  Laterality: N/A;  . REPLACEMENT TOTAL HIP W/  RESURFACING IMPLANTS    . REPLACEMENT TOTAL KNEE BILATERAL  1997  . ROTATOR CUFF REPAIR  06/2008  . ROTATOR CUFF REPAIR Left 2010  . SHOULDER ARTHROSCOPY Left 06/19/2008   WITH SUBACROMIAL DECOMPRESSION. DISTAL CLAVICLE EXCISION. ARTHROSCOPIC ROTATOR CUFF REPAIR.  Marland Kitchen TOTAL HIP ARTHROPLASTY  2009   right  . UMBILICAL HERNIA REPAIR  2001  . VASECTOMY      Allergies  Allergen Reactions  . Citalopram     Other reaction(s): Unknown  . Codeine Nausea And Vomiting  . Sertraline     Other reaction(s): Unknown Other reaction(s): Unknown  . Lodine [Etodolac] Rash  . Percocet [Oxycodone-Acetaminophen] Rash    Tolerates tylenol Patient is able to take this recently    Allergies as of 07/06/2017      Reactions   Citalopram    Other reaction(s): Unknown   Codeine Nausea And Vomiting   Sertraline    Other reaction(s): Unknown Other reaction(s): Unknown   Lodine [etodolac] Rash   Percocet [oxycodone-acetaminophen] Rash   Tolerates tylenol Patient is able to take this recently      Medication List       Accurate as of 07/06/17 11:59 PM. Always use your most recent med list.          acetaminophen 325 MG tablet Commonly known as:  TYLENOL Take 650 mg by mouth every 6 (six) hours as needed. Max of 3000 mg of Apap in 24 hrs   carbidopa-levodopa 25-100 MG tablet Commonly known as:  SINEMET IR Take 2 tablets by mouth 3 (three) times  daily. For parkinson's   cetirizine 5 MG tablet Commonly known as:  ZYRTEC Take 5 mg by mouth at bedtime.   cyanocobalamin 500 MCG tablet Take 500 mcg by mouth daily.   desvenlafaxine 50 MG 24 hr tablet Commonly known as:  PRISTIQ Take 50 mg by mouth every other day. At supper   FLONASE 50 MCG/ACT nasal spray Generic drug:  fluticasone Place 1 spray into both nostrils daily.   gabapentin 100 MG capsule Commonly known as:  NEURONTIN Take 1 capsule (100 mg) by mouth in the morning and 2 capsules (200 mg) by mouth at night.   guaiFENesin 100 MG/5ML liquid Commonly known as:  ROBITUSSIN Take 200 mg by mouth every 4 (four) hours as needed for cough. Notify provider if cough worsens/ continues longer than 3 days.   HYDROcodone-acetaminophen 5-325  MG tablet Commonly known as:  NORCO/VICODIN Give 1/2 - 1 tablets by mouth every 4 hours. 1/2 tablet = moderate pain , 1 tablet = severe pain  Hold for sedation. May give prn dose in addition to scheduled for severe pain. Max of 3000 APAP in 24 hrs.   HYDROcodone-acetaminophen 5-325 MG tablet Commonly known as:  NORCO/VICODIN Take 1/2 or 0.5 tablet - 1 tablet by mouth every 4 hours as needed for moderate to severe pain. 1/2 tablet = moderate pain and 1 tablet = severe pain.   lisinopril 40 MG tablet Commonly known as:  PRINIVIL,ZESTRIL TAKE ONE TABLET BY MOUTH EVERY DAY   magnesium hydroxide 400 MG/5ML suspension Commonly known as:  MILK OF MAGNESIA Take 30 mLs by mouth every 4 (four) hours as needed. If constipation/ no BM for 2 days   ranitidine 75 MG tablet Commonly known as:  ZANTAC Take 1 tablet by mouth daily.   sennosides-docusate sodium 8.6-50 MG tablet Commonly known as:  SENOKOT-S Take 1 tablet by mouth 2 (two) times daily as needed for constipation.   sodium phosphate enema Place 1 enema rectally every three (3) days as needed. If constipation not relieved by milk of magnesia or bisacodyl suppository. Follow package  directions   tamsulosin 0.4 MG Caps capsule Commonly known as:  FLOMAX Take 0.4 mg by mouth daily after supper. 30 mins after supper   XARELTO 20 MG Tabs tablet Generic drug:  rivaroxaban TAKE 1 TABLET BY MOUTH EVERY EVENING   Zinc Oxide 10 % Oint Apply liberal amount topically to areas of skin irritation as needed. Ok to leave at bedside.       Review of Systems  Constitutional: Negative for activity change, appetite change, chills, diaphoresis, fatigue and fever.  HENT: Negative.   Eyes: Negative.   Respiratory: Negative for cough, choking, chest tightness, shortness of breath and wheezing.   Cardiovascular: Negative for chest pain (pressure), palpitations and leg swelling.  Gastrointestinal: Negative for abdominal distention, abdominal pain, constipation, diarrhea and nausea.  Genitourinary: Negative for difficulty urinating, flank pain and frequency.  Musculoskeletal: Positive for arthralgias, back pain, gait problem, joint swelling, neck pain and neck stiffness.  Skin: Negative.   Psychiatric/Behavioral: Negative.   All other systems reviewed and are negative.   Immunization History  Administered Date(s) Administered  . Influenza-Unspecified 10/20/2014, 09/10/2016  . PPD Test 12/05/2015, 11/26/2016  . Pneumococcal Polysaccharide-23 03/13/2014   Pertinent  Health Maintenance Due  Topic Date Due  . PNA vac Low Risk Adult (2 of 2 - PCV13) 03/14/2015  . INFLUENZA VACCINE  07/12/2017   No flowsheet data found. Functional Status Survey:    Vitals:   07/06/17 0902  BP: (!) 145/71  Pulse: 71  Resp: 20  Temp: 98.4 F (36.9 C)  SpO2: 99%  Weight: 206 lb 8 oz (93.7 kg)  Height: 5\' 10"  (1.778 m)   Body mass index is 29.63 kg/m. Physical Exam  Constitutional: He is oriented to person, place, and time. Vital signs are normal. He appears well-developed and well-nourished. He is active and cooperative. He does not appear ill. No distress.  HENT:  Head: Normocephalic  and atraumatic.  Mouth/Throat: Uvula is midline, oropharynx is clear and moist and mucous membranes are normal. Mucous membranes are not pale, not dry and not cyanotic.  Eyes: Pupils are equal, round, and reactive to light. Conjunctivae, EOM and lids are normal.  Neck: Trachea normal, normal range of motion and full passive range of motion without pain. Neck supple.  No JVD present. No tracheal deviation, no edema and no erythema present. No thyromegaly present.  Cardiovascular: Normal rate, regular rhythm, normal heart sounds, intact distal pulses and normal pulses.  Exam reveals no gallop, no distant heart sounds and no friction rub.   No murmur heard. Pulmonary/Chest: Effort normal and breath sounds normal. No accessory muscle usage. No respiratory distress. He has no wheezes. He has no rales. He exhibits no tenderness.  Abdominal: Soft. Normal appearance and bowel sounds are normal. He exhibits no distension and no ascites. There is no tenderness.  Musculoskeletal: He exhibits no edema.       Left knee: He exhibits decreased range of motion. Tenderness found.  Expected osteoarthritis, stiffness, weakness  Neurological: He is alert and oriented to person, place, and time. He has normal strength.  Skin: Skin is warm, dry and intact. No rash noted. He is not diaphoretic. No cyanosis or erythema. No pallor. Nails show no clubbing.  Psychiatric: He has a normal mood and affect. His speech is normal and behavior is normal. Judgment and thought content normal. Cognition and memory are normal.  Nursing note and vitals reviewed.   Labs reviewed:  Recent Labs  09/08/16 1500 03/09/17 1408  NA 141 138  K 4.3 4.1  CL 109 107  CO2 26 25  GLUCOSE 102* 136*  BUN 18 15  CREATININE 0.96 0.94  CALCIUM 9.2 8.9    Recent Labs  09/08/16 1500 03/09/17 1408  AST 16 17  ALT 12* 13*  ALKPHOS 68 79  BILITOT 0.7 0.7  PROT 6.6 6.4*  ALBUMIN 4.3 4.0    Recent Labs  09/08/16 1500 03/09/17 1408    WBC 6.8 6.3  NEUTROABS 3.7 3.5  HGB 13.8 13.1  HCT 40.1 38.7*  MCV 96.1 97.9  PLT 138* 152   Lab Results  Component Value Date   TSH 0.892 05/18/2016   No results found for: HGBA1C No results found for: CHOL, HDL, LDLCALC, LDLDIRECT, TRIG, CHOLHDL  Significant Diagnostic Results in last 30 days:  No results found.  Assessment/Plan 1. Chronic allergic rhinitis, unspecified seasonality, unspecified trigger  Continue Cetirizine 5 mg po Q HS  Continue Fluticasone 50 mcg- 1 sprays each nostril Q day  2. Vitamin B12 deficiency  Stable   Continue Cyanocobalamin 500 mcg po Q Day  3. Constipation, chronic  Stable  Senna S 1 tablet po BID prn  Milk of Magnesia 30 ml PO Q 4 hours prn  4. Other chronic pain  Worsening  Gabapentin 100 mg po Q AM  Gabapentin 200 mg po Q HS  Hydrocodone/APAP 5/325 1 tablet po Q 4 hours during waking hours  Hydrocodone/APAP 5/325 1/2-1 tablet po Q 4 hours prn  RX renewal written #120, no refill  Palliative Care Consult for chronic pain, sx management  5. Essential hypertension  Stable  Continue Lisinopril 40 mg po Q Day  6. Deep vein thrombosis (DVT) of left lower extremity, unspecified chronicity, unspecified vein (HCC)  Stable  Continue Xarelto 20 mg po Q day  7. MCI (mild cognitive impairment)  Stable  8. Major depressive disorder with single episode, remission status unspecified  Stable  Continue Pristiq (desvenlafaxine succinate) tablet extended release 24 hr; 50 mg every other day  9. Complete heart block [facility](HCC)  Pacemaker  Continue periodic follow up with Cardiology for management  10. Parkinson's disease (HCC)  Stable  Continue Carbidopa-Levodopa 25-100 mg- 2 tablets po TID  11. Acid reflux  Continue Ranitidine 75 mg PO Q  day  12. Nocturia  Stable  Continue Flomax 0.4 mg PO Q HS  13. Trochanteric bursitis of left hip  Prednisone 40 mg po Q Day x 5 days  Monitor and follow up  14. Sleep  apnea-like behavior  If family agreeable, trial of O2 2L [place] at night-time  If ineffective or at family request, order sleep study  Family/ staff Communication:   Total Time: 50 minutes  Documentation: 10 minutes  Face to Face: 40 minutes  Family/Phone:   Labs/tests ordered:  Not due  Medication list reviewed and assessed for continued appropriateness. Monthly medication orders reviewed and signed.  Brynda RimShannon H. Darlisa Spruiell, NP-C [facility] [phone] [address] [place] [place], [place] [address] Cell Phone (Mon-Fri 8am-5pm):  [phone] On Call:  [phone] & follow prompts after 5pm & weekends Office Phone:  [phone] Office Fax:  [phone]

## 2017-07-07 NOTE — Telephone Encounter (Signed)
Rx sent to [facility] phone : [phone] , fax : [phone]  

## 2017-07-10 MED ORDER — HYDROCODONE-ACETAMINOPHEN 5-325 MG PO TABS: ORAL_TABLET | ORAL | 0 refills | Status: DC

## 2017-07-10 NOTE — Telephone Encounter (Deleted)
Rx sent to [facility] phone : [phone] , fax : [phone]  

## 2017-07-12 ENCOUNTER — Encounter
Admission: RE | Admit: 2017-07-12 | Discharge: 2017-07-12 | Disposition: A | Discharge status: Home / Self Care | Payer: Medicare Other | Source: Ambulatory Visit | Attending: Internal Medicine | Admitting: Internal Medicine

## 2017-07-17 DIAGNOSIS — G4739 Other sleep apnea: Secondary | ICD-10-CM | POA: Insufficient documentation

## 2017-07-26 ENCOUNTER — Ambulatory Visit (INDEPENDENT_AMBULATORY_CARE_PROVIDER_SITE_OTHER): Payer: Medicare Other | Admitting: *Deleted

## 2017-07-26 ENCOUNTER — Telehealth: Payer: Self-pay | Admitting: Cardiology

## 2017-07-26 DIAGNOSIS — I442 Atrioventricular block, complete: Secondary | ICD-10-CM | POA: Diagnosis not present

## 2017-07-26 NOTE — Telephone Encounter (Signed)
New Message: ° ° ° °Please call, pt is having trouble transmitting. °

## 2017-07-27 NOTE — Telephone Encounter (Signed)
Transmission received this AM.  Made patient's wife, Glena Norfolkolly, aware that transmission was successful.  She reports she will make nursing staff at the facility aware.  She is appreciative of call and denies additional questions or concerns at this time.

## 2017-07-28 NOTE — Progress Notes (Signed)
Remote pacemaker check. 

## 2017-08-03 LAB — CUP PACEART REMOTE DEVICE CHECK
Battery Impedance: 936 Ohm
Battery Voltage: 2.78 V
Brady Statistic AP VP Percent: 38 %
Brady Statistic AP VS Percent: 0 %
Brady Statistic AS VP Percent: 61 %
Brady Statistic AS VS Percent: 0 %
Date Time Interrogation Session: 20180816130231
Implantable Lead Implant Date: 20130424
Implantable Lead Implant Date: 20130424
Implantable Lead Location: 753859
Implantable Lead Model: 5076
Implantable Lead Model: 5076
Implantable Pulse Generator Implant Date: 20130424
Lead Channel Impedance Value: 490 Ohm
Lead Channel Impedance Value: 653 Ohm
Lead Channel Pacing Threshold Amplitude: 0.375 V
Lead Channel Pacing Threshold Pulse Width: 0.4 ms
Lead Channel Setting Pacing Amplitude: 2 V
Lead Channel Setting Pacing Pulse Width: 0.4 ms
MDC IDC LEAD LOCATION: 753860
MDC IDC MSMT BATTERY REMAINING LONGEVITY: 61 mo
MDC IDC MSMT LEADCHNL RV PACING THRESHOLD AMPLITUDE: 0.5 V
MDC IDC MSMT LEADCHNL RV PACING THRESHOLD PULSEWIDTH: 0.4 ms
MDC IDC SET LEADCHNL RA PACING AMPLITUDE: 1.5 V
MDC IDC SET LEADCHNL RV SENSING SENSITIVITY: 4 mV

## 2017-08-08 ENCOUNTER — Encounter: Payer: Self-pay | Admitting: Cardiology

## 2017-08-09 ENCOUNTER — Other Ambulatory Visit: Payer: Self-pay

## 2017-08-09 MED ORDER — HYDROCODONE-ACETAMINOPHEN 5-325 MG PO TABS: ORAL_TABLET | ORAL | 0 refills | Status: DC

## 2017-08-09 MED ORDER — HYDROCODONE-ACETAMINOPHEN 5-325 MG PO TABS: 1.0000 | ORAL_TABLET | ORAL | 0 refills | Status: DC

## 2017-08-09 NOTE — Telephone Encounter (Signed)
Rx sent to [facility] phone : [phone] , fax : [phone]  

## 2017-08-11 ENCOUNTER — Encounter: Payer: Self-pay | Admitting: Gerontology

## 2017-08-11 ENCOUNTER — Non-Acute Institutional Stay (SKILLED_NURSING_FACILITY): Payer: Medicare Other | Admitting: Gerontology

## 2017-08-11 DIAGNOSIS — K5909 Other constipation: Secondary | ICD-10-CM | POA: Diagnosis not present

## 2017-08-11 DIAGNOSIS — K219 Gastro-esophageal reflux disease without esophagitis: Secondary | ICD-10-CM

## 2017-08-11 DIAGNOSIS — G3184 Mild cognitive impairment, so stated: Secondary | ICD-10-CM | POA: Diagnosis not present

## 2017-08-11 DIAGNOSIS — I442 Atrioventricular block, complete: Secondary | ICD-10-CM

## 2017-08-11 DIAGNOSIS — I82402 Acute embolism and thrombosis of unspecified deep veins of left lower extremity: Secondary | ICD-10-CM | POA: Diagnosis not present

## 2017-08-11 DIAGNOSIS — F329 Major depressive disorder, single episode, unspecified: Secondary | ICD-10-CM | POA: Diagnosis not present

## 2017-08-11 DIAGNOSIS — I10 Essential (primary) hypertension: Secondary | ICD-10-CM | POA: Diagnosis not present

## 2017-08-11 DIAGNOSIS — G20 Parkinson's disease: Secondary | ICD-10-CM

## 2017-08-11 DIAGNOSIS — G4739 Other sleep apnea: Secondary | ICD-10-CM

## 2017-08-11 DIAGNOSIS — J309 Allergic rhinitis, unspecified: Secondary | ICD-10-CM | POA: Diagnosis not present

## 2017-08-11 DIAGNOSIS — M7062 Trochanteric bursitis, left hip: Secondary | ICD-10-CM

## 2017-08-11 DIAGNOSIS — G8929 Other chronic pain: Secondary | ICD-10-CM | POA: Diagnosis not present

## 2017-08-11 DIAGNOSIS — R351 Nocturia: Secondary | ICD-10-CM | POA: Diagnosis not present

## 2017-08-11 DIAGNOSIS — E538 Deficiency of other specified B group vitamins: Secondary | ICD-10-CM | POA: Diagnosis not present

## 2017-08-11 NOTE — Progress Notes (Signed)
Location:   [facility] Room Number: [id number] Place of Service:  SNF [id number]) Provider:  Lorenso Quarry, NP-C  [name], MD  Patient Care Team: [name], MD as PCP - General (Family Medicine)  Extended Emergency Contact Information Primary Emergency Contact: Marva Panda [place] Home Phone: [phone] Mobile Phone: [phone] Relation: Spouse Secondary Emergency Contact: Gwynneth Munson [place] Mobile Phone: [phone] Relation: Son  Code Status:  DNR Goals of care: Advanced Directive information Advanced Directives 08/11/2017  Does Patient Have a Medical Advance Directive? Yes  Type of Advance Directive Out of facility DNR (pink MOST or yellow form)  Does patient want to make changes to medical advance directive? No - Patient declined  Copy of Healthcare Power of Attorney in Chart? -  Would patient like information on creating a medical advance directive? -  Pre-existing out of facility DNR order (yellow form or pink MOST form) -     Chief Complaint  Patient presents with  . Medical Management of Chronic Issues    Routine Visit    HPI:  Pt is a 83 y.o. male seen today for medical management of chronic diseases. Pt reports he has generally been doing well. He does reports his pain is better controlled at this point with scheduled medications. His Parkinson's symptoms are no longer controlled without medications. He does report occasional non-threatening, visual hallucinations. However, the parkinson's meds are being managed by his neurologist. He denies chest pain or shortness of breath, but reports feeling as though he is choking/ wheezing at night. No cough or congestion associated with this. Pt reports it feels structural. No known issues with the pacemaker. Pt is being followed by cardiology for the heart block. Blood pressures are stable. No signs of bleeding from the chronic use of Xarelto d/t  chronic DVT of Left iliac vein. Pulses equal. Symptoms of chronic allergic rhinitis, reflux and constipation controlled/ stable. Continues Vitamin B12 supplementation due to deficiency. Pt is mobile with wheelchair. Appetite is good. Having regular BM's. VSS. No other complaints.    Past Medical History:  Diagnosis Date  . Anxiety   . Arthritis   . Blood transfusion    hx of autologus transfusion  . Cataract cortical, senile   . Chicken pox   . Clotting disorder (HCC)   . Complete heart block (HCC)   . Depression    unspecified  . DVT (deep venous thrombosis) (HCC) 09/2014   left leg  . GERD (gastroesophageal reflux disease)   . Hereditary and idiopathic neuropathy   . History of depression   . Hypertension   . MCI (mild cognitive impairment) 10/22/2013  . Memory deficit 03/24/2011  . Memory loss   . Movement disorder   . Neuromuscular disorder (HCC)    peripheral  neuropathy  . Neuropathy   . Osteoarthritis   . Other hammer toe(s) (acquired), unspecified foot   . Pacemaker   . Parkinson's disease (HCC)   . Parkinsonism (HCC) 10/22/2013  . Shortness of breath   . Spinal stenosis   . Synovitis and tenosynovitis, unspecified   . Third degree heart block (HCC)   . Tremor    Past Surgical History:  Procedure Laterality Date  . CATARACT EXTRACTION  06/2005  . COLONOSCOPY  06/13/2006   PH colon polyps (MUS) repeat 7/20  . CORRECTION HAMMER TOE    . defibrilator  04/04/2012   Dual Chamber  . INSERT / REPLACE / REMOVE PACEMAKER  04/04/2012  .  JOINT REPLACEMENT Right    THA  . JOINT REPLACEMENT Bilateral    TKA  . KNEE ARTHROSCOPY Bilateral    Right x 2 and Left  . KNEE SURGERY  [id number]  . LEAD REVISION N/A 04/05/2012   Procedure: LEAD REVISION;  Surgeon: Duke Salvia, MD;  Location: [facility] CATH LAB;  Service: Cardiovascular;  Laterality: N/A;  . PERMANENT PACEMAKER INSERTION N/A 04/04/2012   Procedure: PERMANENT PACEMAKER INSERTION;  Surgeon: Marinus Maw, MD;   Location: [facility] CATH LAB;  Service: Cardiovascular;  Laterality: N/A;  . REPLACEMENT TOTAL HIP W/  RESURFACING IMPLANTS    . REPLACEMENT TOTAL KNEE BILATERAL  1997  . ROTATOR CUFF REPAIR  06/2008  . ROTATOR CUFF REPAIR Left 2010  . SHOULDER ARTHROSCOPY Left 06/19/2008   WITH SUBACROMIAL DECOMPRESSION. DISTAL CLAVICLE EXCISION. ARTHROSCOPIC ROTATOR CUFF REPAIR.  Marland Kitchen TOTAL HIP ARTHROPLASTY  2009   right  . UMBILICAL HERNIA REPAIR  2001  . VASECTOMY      Allergies  Allergen Reactions  . Citalopram     Other reaction(s): Unknown  . Codeine Nausea And Vomiting  . Sertraline     Other reaction(s): Unknown Other reaction(s): Unknown  . Lodine [Etodolac] Rash  . Percocet [Oxycodone-Acetaminophen] Rash    Tolerates tylenol Patient is able to take this recently    Allergies as of 08/11/2017      Reactions   Citalopram    Other reaction(s): Unknown   Codeine Nausea And Vomiting   Sertraline    Other reaction(s): Unknown Other reaction(s): Unknown   Lodine [etodolac] Rash   Percocet [oxycodone-acetaminophen] Rash   Tolerates tylenol Patient is able to take this recently      Medication List       Accurate as of 08/11/17  9:07 AM. Always use your most recent med list.          acetaminophen 325 MG tablet Commonly known as:  TYLENOL Take 650 mg by mouth every 6 (six) hours as needed. Max of 3000 mg of Apap in 24 hrs   carbidopa-levodopa 25-250 MG tablet Commonly known as:  SINEMET IR Take 1 tablet by mouth 3 (three) times daily.   cetirizine 5 MG tablet Commonly known as:  ZYRTEC Take 5 mg by mouth at bedtime.   cyanocobalamin 500 MCG tablet Take 500 mcg by mouth daily.   desvenlafaxine 50 MG 24 hr tablet Commonly known as:  PRISTIQ Take 50 mg by mouth every other day. At supper   FLONASE 50 MCG/ACT nasal spray Generic drug:  fluticasone Place 1 spray into both nostrils daily.   gabapentin 100 MG capsule Commonly known as:  NEURONTIN Take 1 capsule (100 mg) by  mouth in the morning and 2 capsules (200 mg) by mouth at night.   guaiFENesin 100 MG/5ML liquid Commonly known as:  ROBITUSSIN Take 200 mg by mouth every 4 (four) hours as needed for cough. Notify provider if cough worsens/ continues longer than 3 days.   HYDROcodone-acetaminophen 5-325 MG tablet Commonly known as:  NORCO/VICODIN Take 1/2 or 0.5 tablet - 1 tablet by mouth every 4 hours as needed for moderate to severe pain. 1/2 tablet = moderate pain and 1 tablet = severe pain.   HYDROcodone-acetaminophen 5-325 MG tablet Commonly known as:  NORCO/VICODIN Take 1 tablet by mouth every 4 (four) hours. Hold for sedation. May give prn doses in addition to scheduled for severe pain. Max of 3000 mg/Apap in 24 hrs.   ipratropium-albuterol 0.5-2.5 (3) MG/3ML Soln Commonly known  as:  DUONEB Take 3 mLs by nebulization every 6 (six) hours as needed (wheezing and shortness of breath with cough).   lisinopril 40 MG tablet Commonly known as:  PRINIVIL,ZESTRIL TAKE ONE TABLET BY MOUTH EVERY DAY   magnesium hydroxide 400 MG/5ML suspension Commonly known as:  MILK OF MAGNESIA Take 30 mLs by mouth every 4 (four) hours as needed. If constipation/ no BM for 2 days   OXYGEN Inhale 2 L into the lungs as needed. Check O2 sat prior to placing on patient and at least every 4 hours after applying. Notify MD if O2 sat drop below baseline while receiving oxygen or no improvement in dyspnea   OXYGEN Inhale 2 L into the lungs at bedtime. For sleep apnea like behavior   pantoprazole 40 MG tablet Commonly known as:  PROTONIX Take 40 mg by mouth daily.   sennosides-docusate sodium 8.6-50 MG tablet Commonly known as:  SENOKOT-S Take 1 tablet by mouth 2 (two) times daily as needed for constipation.   sodium phosphate enema Place 1 enema rectally every three (3) days as needed. If constipation not relieved by milk of magnesia or bisacodyl suppository. Follow package directions   tamsulosin 0.4 MG Caps  capsule Commonly known as:  FLOMAX Take 0.4 mg by mouth daily after supper. 30 mins after supper   XARELTO 20 MG Tabs tablet Generic drug:  rivaroxaban TAKE 1 TABLET BY MOUTH EVERY EVENING   Zinc Oxide 10 % Oint Apply liberal amount topically to areas of skin irritation as needed. Ok to leave at bedside.       Review of Systems  Constitutional: Negative for activity change, appetite change, chills, diaphoresis, fatigue and fever.  HENT: Negative.   Eyes: Negative.   Respiratory: Negative for cough, choking, chest tightness, shortness of breath and wheezing.   Cardiovascular: Negative for chest pain (pressure), palpitations and leg swelling.  Gastrointestinal: Negative for abdominal distention, abdominal pain, constipation, diarrhea and nausea.  Genitourinary: Negative for difficulty urinating, flank pain and frequency.  Musculoskeletal: Positive for arthralgias, back pain, gait problem, joint swelling, neck pain and neck stiffness.  Skin: Negative.   Psychiatric/Behavioral: Negative.   All other systems reviewed and are negative.   Immunization History  Administered Date(s) Administered  . Influenza-Unspecified 10/20/2014, 09/10/2016  . PPD Test 12/05/2015, 11/26/2016  . Pneumococcal Polysaccharide-23 03/13/2014   Pertinent  Health Maintenance Due  Topic Date Due  . PNA vac Low Risk Adult (2 of 2 - PCV13) 03/14/2015  . INFLUENZA VACCINE  07/12/2017   No flowsheet data found. Functional Status Survey:    Vitals:   08/11/17 0843  BP: 110/66  Pulse: 66  Resp: 16  Temp: 97.6 F (36.4 C)  SpO2: 96%  Weight: 214 lb (97.1 kg)  Height: 5\' 10"  (1.778 m)   Body mass index is 30.71 kg/m. Physical Exam  Constitutional: He is oriented to person, place, and time. Vital signs are normal. He appears well-developed and well-nourished. He is active and cooperative. He does not appear ill. No distress.  HENT:  Head: Normocephalic and atraumatic.  Mouth/Throat: Uvula is  midline, oropharynx is clear and moist and mucous membranes are normal. Mucous membranes are not pale, not dry and not cyanotic.  Eyes: Pupils are equal, round, and reactive to light. Conjunctivae, EOM and lids are normal.  Neck: Trachea normal, normal range of motion and full passive range of motion without pain. Neck supple. No JVD present. No tracheal deviation, no edema and no erythema present. No thyromegaly present.  Cardiovascular: Normal rate, regular rhythm, normal heart sounds, intact distal pulses and normal pulses.  Exam reveals no gallop, no distant heart sounds and no friction rub.   No murmur heard. Pulmonary/Chest: Effort normal and breath sounds normal. No accessory muscle usage. No respiratory distress. He has no wheezes. He has no rales. He exhibits no tenderness.  Abdominal: Soft. Normal appearance and bowel sounds are normal. He exhibits no distension and no ascites. There is no tenderness.  Musculoskeletal: He exhibits no edema.       Left knee: He exhibits decreased range of motion. Tenderness found.  Expected osteoarthritis, stiffness, weakness  Neurological: He is alert and oriented to person, place, and time. He has normal strength.  Skin: Skin is warm, dry and intact. No rash noted. He is not diaphoretic. No cyanosis or erythema. No pallor. Nails show no clubbing.  Psychiatric: He has a normal mood and affect. His speech is normal and behavior is normal. Judgment and thought content normal. Cognition and memory are normal.  Nursing note and vitals reviewed.   Labs reviewed:  Recent Labs  09/08/16 1500 03/09/17 1408  NA 141 138  K 4.3 4.1  CL 109 107  CO2 26 25  GLUCOSE 102* 136*  BUN 18 15  CREATININE 0.96 0.94  CALCIUM 9.2 8.9    Recent Labs  09/08/16 1500 03/09/17 1408  AST 16 17  ALT 12* 13*  ALKPHOS 68 79  BILITOT 0.7 0.7  PROT 6.6 6.4*  ALBUMIN 4.3 4.0    Recent Labs  09/08/16 1500 03/09/17 1408  WBC 6.8 6.3  NEUTROABS 3.7 3.5  HGB  13.8 13.1  HCT 40.1 38.7*  MCV 96.1 97.9  PLT 138* 152   Lab Results  Component Value Date   TSH 0.892 05/18/2016   No results found for: HGBA1C No results found for: CHOL, HDL, LDLCALC, LDLDIRECT, TRIG, CHOLHDL  Significant Diagnostic Results in last 30 days:  No results found.  Assessment/Plan 1. Chronic allergic rhinitis, unspecified seasonality, unspecified trigger  Continue Cetirizine 5 mg po Q HS  Continue Fluticasone 50 mcg- 1 sprays each nostril Q day  2. Vitamin B12 deficiency  Stable   Continue Cyanocobalamin 500 mcg po Q Day  3. Constipation, chronic  Stable  Senna S 1 tablet po BID prn  Milk of Magnesia 30 ml PO Q 4 hours prn  4. Other chronic pain  Worsening  Gabapentin 100 mg po Q AM  Gabapentin 200 mg po Q HS  Hydrocodone/APAP 5/325 1 tablet po Q 4 hours during waking hours  Hydrocodone/APAP 5/325 1/2-1 tablet po Q 4 hours prn  RX renewal written #120, no refill  Palliative Care following for chronic pain, sx management  5. Essential hypertension  Stable  Continue Lisinopril 40 mg po Q Day  6. Deep vein thrombosis (DVT) of left lower extremity, unspecified chronicity, unspecified vein (HCC)  Stable  Continue Xarelto 20 mg po Q day  7. MCI (mild cognitive impairment)  Stable  8. Major depressive disorder with single episode, remission status unspecified  Stable  Continue Pristiq (desvenlafaxine succinate) tablet extended release 24 hr; 50 mg every other day  9. Complete heart block [facility](HCC)  Pacemaker  Continue periodic follow up with Cardiology for management  10. Parkinson's disease (HCC)  Stable  Continue Carbidopa-Levodopa 25-250 mg- 1 tablet po TID  11. Acid reflux  Discontinue Ranitidine 75 mg PO Q day  Protonix 40 mg po Q Day  12. Nocturia  Stable  Continue Flomax 0.4  mg PO Q HS  13. Trochanteric bursitis of left hip  Prednisone 40 mg po Q Day x 5 days  Monitor and follow up  14. Sleep apnea-like  behavior  If family agreeable, trial of O2 2L [place] at night-time  If ineffective or at family request, order sleep study  Family/ staff Communication:   Total Time: 50 minutes  Documentation: 10 minutes  Face to Face: 40 minutes  Family/Phone:   Labs/tests ordered:  Not due  Medication list reviewed and assessed for continued appropriateness. Monthly medication orders reviewed and signed.  Brynda Rim, NP-C Geriatrics [facility] [phone] [address], [place] [address] Cell Phone (Mon-Fri 8am-5pm):  [phone] On Call:  [phone] & follow prompts after 5pm & weekends Office Phone:  [phone] Office Fax:  [phone]

## 2017-08-12 ENCOUNTER — Encounter
Admission: RE | Admit: 2017-08-12 | Discharge: 2017-08-12 | Disposition: A | Discharge status: Home / Self Care | Payer: Medicare Other | Source: Ambulatory Visit | Attending: Internal Medicine | Admitting: Internal Medicine

## 2017-08-24 ENCOUNTER — Other Ambulatory Visit: Payer: Self-pay

## 2017-08-24 MED ORDER — HYDROCODONE-ACETAMINOPHEN 5-325 MG PO TABS: 1.0000 | ORAL_TABLET | ORAL | 0 refills | Status: DC

## 2017-08-24 MED ORDER — HYDROCODONE-ACETAMINOPHEN 5-325 MG PO TABS: ORAL_TABLET | ORAL | 0 refills | Status: DC

## 2017-08-24 NOTE — Telephone Encounter (Signed)
Rx sent to [facility] phone : [phone] , fax : [phone]  

## 2017-09-05 ENCOUNTER — Ambulatory Visit (INDEPENDENT_AMBULATORY_CARE_PROVIDER_SITE_OTHER): Payer: Medicare Other | Admitting: Internal Medicine

## 2017-09-05 ENCOUNTER — Encounter: Payer: Self-pay | Admitting: Internal Medicine

## 2017-09-05 VITALS — BP 130/70 | HR 70 | Ht 71.0 in | Wt 206.0 lb

## 2017-09-05 DIAGNOSIS — I442 Atrioventricular block, complete: Secondary | ICD-10-CM

## 2017-09-05 DIAGNOSIS — Z950 Presence of cardiac pacemaker: Secondary | ICD-10-CM

## 2017-09-05 NOTE — Patient Instructions (Signed)

## 2017-09-05 NOTE — Progress Notes (Signed)
Patient Care Team: [name], MD as PCP - General (Family Medicine)   HPI  Randy [name] is a 83 y.o. male seen for pacemaker followup for complete heart block implanted 4/13 and complicated by micropeforation requiring lead reposition   No chest pain or sob or edema;  Exercise toleraqnce is limited significantly by knee and back pain  Some pool exercises   He is now in skilled nursing with no further galls   Echocardiogram 4/13 demonstrated normal LV function and mild left atrial enlargement  4/16 potassium 4.1 creatinine 0.9  Past Medical History:  Diagnosis Date  . Anxiety   . Arthritis   . Blood transfusion    hx of autologus transfusion  . Cataract cortical, senile   . Chicken pox   . Clotting disorder (HCC)   . Complete heart block (HCC)   . Depression    unspecified  . DVT (deep venous thrombosis) (HCC) 09/2014   left leg  . GERD (gastroesophageal reflux disease)   . Hereditary and idiopathic neuropathy   . History of depression   . Hypertension   . MCI (mild cognitive impairment) 10/22/2013  . Memory deficit 03/24/2011  . Memory loss   . Movement disorder   . Neuromuscular disorder (HCC)    peripheral  neuropathy  . Neuropathy   . Osteoarthritis   . Other hammer toe(s) (acquired), unspecified foot   . Pacemaker   . Parkinson's disease (HCC)   . Parkinsonism (HCC) 10/22/2013  . Shortness of breath   . Spinal stenosis   . Synovitis and tenosynovitis, unspecified   . Third degree heart block (HCC)   . Tremor     Past Surgical History:  Procedure Laterality Date  . CATARACT EXTRACTION  06/2005  . COLONOSCOPY  06/13/2006   PH colon polyps (MUS) repeat 7/20  . CORRECTION HAMMER TOE    . defibrilator  04/04/2012   Dual Chamber  . INSERT / REPLACE / REMOVE PACEMAKER  04/04/2012  . JOINT REPLACEMENT Right    THA  . JOINT REPLACEMENT Bilateral    TKA  . KNEE ARTHROSCOPY Bilateral    Right x 2 and Left  . KNEE SURGERY   [phone]  . LEAD REVISION N/A 04/05/2012   Procedure: LEAD REVISION;  Surgeon: Duke Salvia, MD;  Location: [facility] CATH LAB;  Service: Cardiovascular;  Laterality: N/A;  . PERMANENT PACEMAKER INSERTION N/A 04/04/2012   Procedure: PERMANENT PACEMAKER INSERTION;  Surgeon: Marinus Maw, MD;  Location: [facility] CATH LAB;  Service: Cardiovascular;  Laterality: N/A;  . REPLACEMENT TOTAL HIP W/  RESURFACING IMPLANTS    . REPLACEMENT TOTAL KNEE BILATERAL  1997  . ROTATOR CUFF REPAIR  06/2008  . ROTATOR CUFF REPAIR Left 2010  . SHOULDER ARTHROSCOPY Left 06/19/2008   WITH SUBACROMIAL DECOMPRESSION. DISTAL CLAVICLE EXCISION. ARTHROSCOPIC ROTATOR CUFF REPAIR.  Marland Kitchen TOTAL HIP ARTHROPLASTY  2009   right  . UMBILICAL HERNIA REPAIR  2001  . VASECTOMY      Current Outpatient Prescriptions  Medication Sig Dispense Refill  . acetaminophen (TYLENOL) 325 MG tablet Take 650 mg by mouth every 6 (six) hours as needed. Max of 3000 mg of Apap in 24 hrs    . carbidopa-levodopa (SINEMET IR) 25-250 MG tablet Take 1 tablet by mouth 3 (three) times daily.    . cetirizine (ZYRTEC) 5 MG tablet Take 5 mg by mouth at bedtime.     . cyanocobalamin 500 MCG tablet Take 500 mcg by mouth daily.    Marland Kitchen  desvenlafaxine (PRISTIQ) 50 MG 24 hr tablet Take 50 mg by mouth every other day. At supper    . fluticasone (FLONASE) 50 MCG/ACT nasal spray Place 1 spray into both nostrils daily.     Marland Kitchen gabapentin (NEURONTIN) 100 MG capsule Take 1 capsule (100 mg) by mouth in the morning and 2 capsules (200 mg) by mouth at night.    Marland Kitchen guaiFENesin (ROBITUSSIN) 100 MG/5ML liquid Take 200 mg by mouth every 4 (four) hours as needed for cough. Notify provider if cough worsens/ continues longer than 3 days.    Marland Kitchen HYDROcodone-acetaminophen (NORCO/VICODIN) 5-325 MG tablet Take 1 tablet by mouth every 4 (four) hours. Hold for sedation. May give prn doses in addition to scheduled for severe pain. Max of 3000 mg/Apap in 24 hrs. 60 tablet 0  . ipratropium-albuterol  (DUONEB) 0.5-2.5 (3) MG/3ML SOLN Take 3 mLs by nebulization every 6 (six) hours as needed (wheezing and shortness of breath with cough).    Marland Kitchen lisinopril (PRINIVIL,ZESTRIL) 40 MG tablet TAKE ONE TABLET BY MOUTH EVERY DAY 30 tablet 3  . magnesium hydroxide (MILK OF MAGNESIA) 400 MG/5ML suspension Take 30 mLs by mouth every 4 (four) hours as needed. If constipation/ no BM for 2 days    . OXYGEN Inhale 2 L into the lungs as needed. Check O2 sat prior to placing on patient and at least every 4 hours after applying. Notify MD if O2 sat drop below baseline while receiving oxygen or no improvement in dyspnea    . OXYGEN Inhale 2 L into the lungs at bedtime. For sleep apnea like behavior    . pantoprazole (PROTONIX) 40 MG tablet Take 40 mg by mouth daily.    . sennosides-docusate sodium (SENOKOT-S) 8.6-50 MG tablet Take 1 tablet by mouth 2 (two) times daily as needed for constipation.    . sodium phosphate (FLEET) enema Place 1 enema rectally every three (3) days as needed. If constipation not relieved by milk of magnesia or bisacodyl suppository. Follow package directions    . tamsulosin (FLOMAX) 0.4 MG CAPS capsule Take 0.4 mg by mouth daily after supper. 30 mins after supper    . XARELTO 20 MG TABS tablet TAKE 1 TABLET BY MOUTH EVERY EVENING 30 tablet 3  . Zinc Oxide 10 % OINT Apply liberal amount topically to areas of skin irritation as needed. Ok to leave at bedside.     No current facility-administered medications for this visit.     Allergies  Allergen Reactions  . Citalopram     Other reaction(s): Unknown  . Codeine Nausea And Vomiting  . Sertraline     Other reaction(s): Unknown Other reaction(s): Unknown  . Lodine [Etodolac] Rash  . Percocet [Oxycodone-Acetaminophen] Rash    Tolerates tylenol Patient is able to take this recently    Review of Systems negative except from HPI and PMH  Physical Exam BP 130/70 (BP Location: Left Arm, Patient Position: Sitting, Cuff Size: Normal)    Pulse 70   Ht  (1.803 m)   Wt 206 lb (93.4 kg) [facility]  BMI 28.73 kg/m    Well developed and nourished in no acute distress siotting in wheel chair HENT normal Neck supple with JVP-flat Clear Regular rate and rhythm, no murmurs or gallops Abd-soft with active BS without hepatomegaly No Clubbing cyanosis tr edema Skin-warm and dry A & Oriented  Grossly normal sensory and motor function  ECG  Sinus with P-synchronous/ AV  pacing  Assessment and  Plan  Complete Heart Block stable post pacing  Pacemaker  [employer]  The patient's device was interrogated.  The information was reviewed. Mode switch was activated  Hypertension      Falls  Now in chair    BP well controlled

## 2017-09-08 ENCOUNTER — Encounter: Payer: Self-pay | Admitting: Gerontology

## 2017-09-08 ENCOUNTER — Non-Acute Institutional Stay (SKILLED_NURSING_FACILITY): Payer: Medicare Other | Admitting: Gerontology

## 2017-09-08 DIAGNOSIS — I10 Essential (primary) hypertension: Secondary | ICD-10-CM | POA: Diagnosis not present

## 2017-09-08 DIAGNOSIS — I82402 Acute embolism and thrombosis of unspecified deep veins of left lower extremity: Secondary | ICD-10-CM | POA: Diagnosis not present

## 2017-09-08 DIAGNOSIS — G8929 Other chronic pain: Secondary | ICD-10-CM | POA: Diagnosis not present

## 2017-09-08 DIAGNOSIS — J309 Allergic rhinitis, unspecified: Secondary | ICD-10-CM | POA: Diagnosis not present

## 2017-09-08 DIAGNOSIS — K219 Gastro-esophageal reflux disease without esophagitis: Secondary | ICD-10-CM

## 2017-09-08 DIAGNOSIS — I442 Atrioventricular block, complete: Secondary | ICD-10-CM

## 2017-09-08 DIAGNOSIS — M7062 Trochanteric bursitis, left hip: Secondary | ICD-10-CM

## 2017-09-08 DIAGNOSIS — G3184 Mild cognitive impairment, so stated: Secondary | ICD-10-CM

## 2017-09-08 DIAGNOSIS — R351 Nocturia: Secondary | ICD-10-CM

## 2017-09-08 DIAGNOSIS — G4739 Other sleep apnea: Secondary | ICD-10-CM

## 2017-09-08 DIAGNOSIS — K5909 Other constipation: Secondary | ICD-10-CM | POA: Diagnosis not present

## 2017-09-08 DIAGNOSIS — F329 Major depressive disorder, single episode, unspecified: Secondary | ICD-10-CM

## 2017-09-08 DIAGNOSIS — E538 Deficiency of other specified B group vitamins: Secondary | ICD-10-CM | POA: Diagnosis not present

## 2017-09-08 DIAGNOSIS — G20 Parkinson's disease: Secondary | ICD-10-CM

## 2017-09-08 NOTE — Progress Notes (Signed)
Location:   [facility] Room Number: [place]:  SNF [phone]) Provider:  Toni Arthurs, NP-C  [place] Pink, MD  Patient Care Team: [place] Pink, MD as PCP - General (Family Medicine)  Extended Emergency Contact Information Primary Emergency Contact: Hermelinda Medicus [place] of [place] Phone: [phone] Mobile Phone: [phone] Relation: Spouse Secondary Emergency Contact: Elvina Sidle [place] of [place] Mobile Phone: [phone] Relation: Son  Code Status:  DNR Goals of care: Advanced Directive information Advanced Directives 09/08/2017  Does Patient Have a Medical Advance Directive? Yes  Type of Advance Directive Out of facility DNR (pink MOST or yellow form)  Does patient want to make changes to medical advance directive? No - Patient declined  Copy of [place] in Chart? -  Would patient like information on creating a medical advance directive? -  Pre-existing out of facility DNR order (yellow form or pink MOST form) -     Chief Complaint  Patient presents with  . Medical Management of Chronic Issues    Routine Visit    HPI:  Pt is a 83 y.o. male seen today for medical management of chronic diseases. Pt reports he has generally been doing well. He does reports his pain is better controlled at this point with scheduled medications and is sleeping better. His Parkinson's symptoms are controlled with medications. He now denies visual hallucinations. Parkinson's meds are being managed by his neurologist. He denies chest pain or shortness of breath. No known issues with the pacemaker. Pt is being followed by cardiology for the heart block. Blood pressures are stable. No signs of bleeding from the chronic use of Xarelto d/t chronic DVT of Left iliac vein. Pulses equal. Symptoms of chronic allergic rhinitis, reflux and constipation controlled/ stable. Continues Vitamin B12 supplementation due to  deficiency. Pt is mobile with wheelchair. Appetite is good. Having regular BM's. No falls this month. VSS. No other complaints.    Past Medical History:  Diagnosis Date  . Anxiety   . Arthritis   . Blood transfusion    hx of autologus transfusion  . Cataract cortical, senile   . Chicken pox   . Clotting disorder ([place])   . Complete heart block ([place])   . Depression    unspecified  . DVT (deep venous thrombosis) ([place]) 09/2014   left leg  . GERD (gastroesophageal reflux disease)   . Hereditary and idiopathic neuropathy   . History of depression   . Hypertension   . MCI (mild cognitive impairment) 10/22/2013  . Memory deficit 03/24/2011  . Memory loss   . Movement disorder   . Neuromuscular disorder ([place])    peripheral  neuropathy  . Neuropathy   . Osteoarthritis   . Other hammer toe(s) (acquired), unspecified foot   . Pacemaker   . Parkinson's disease ([place])   . Parkinsonism ([place]) 10/22/2013  . Shortness of breath   . Spinal stenosis   . Synovitis and tenosynovitis, unspecified   . Third degree heart block ([place])   . Tremor    Past Surgical History:  Procedure Laterality Date  . CATARACT EXTRACTION  06/2005  . COLONOSCOPY  06/13/2006   PH colon polyps (MUS) repeat 7/20  . CORRECTION HAMMER TOE    . defibrilator  04/04/2012   Dual Chamber  . INSERT / REPLACE / REMOVE PACEMAKER  04/04/2012  . JOINT REPLACEMENT Right    THA  . JOINT REPLACEMENT Bilateral    TKA  . KNEE  ARTHROSCOPY Bilateral    Right x 2 and Left  . KNEE SURGERY  [phone]  . LEAD REVISION N/A 04/05/2012   Procedure: LEAD REVISION;  Surgeon: Deboraha Sprang, MD;  Location: [facility] CATH LAB;  Service: Cardiovascular;  Laterality: N/A;  . PERMANENT PACEMAKER INSERTION N/A 04/04/2012   Procedure: PERMANENT PACEMAKER INSERTION;  Surgeon: Evans Lance, MD;  Location: [facility] CATH LAB;  Service: Cardiovascular;  Laterality: N/A;  . REPLACEMENT TOTAL HIP W/  RESURFACING IMPLANTS    . REPLACEMENT TOTAL KNEE BILATERAL   1997  . ROTATOR CUFF REPAIR  06/2008  . ROTATOR CUFF REPAIR Left 2010  . SHOULDER ARTHROSCOPY Left 06/19/2008   WITH SUBACROMIAL DECOMPRESSION. DISTAL CLAVICLE EXCISION. ARTHROSCOPIC ROTATOR CUFF REPAIR.  Marland Kitchen TOTAL HIP ARTHROPLASTY  2009   right  . UMBILICAL HERNIA REPAIR  2001  . VASECTOMY      Allergies  Allergen Reactions  . Citalopram     Other reaction(s): Unknown  . Codeine Nausea And Vomiting  . Sertraline     Other reaction(s): Unknown Other reaction(s): Unknown  . Lodine [Etodolac] Rash  . Percocet [Oxycodone-Acetaminophen] Rash    Tolerates tylenol Patient is able to take this recently    Allergies as of 09/08/2017      Reactions   Citalopram    Other reaction(s): Unknown   Codeine Nausea And Vomiting   Sertraline    Other reaction(s): Unknown Other reaction(s): Unknown   Lodine [etodolac] Rash   Percocet [oxycodone-acetaminophen] Rash   Tolerates tylenol Patient is able to take this recently      Medication List       Accurate as of 09/08/17  9:39 AM. Always use your most recent med list.          acetaminophen 325 MG tablet Commonly known as:  TYLENOL Take 650 mg by mouth every 6 (six) hours as needed. Max of 3000 mg of Apap in 24 hrs   carbidopa-levodopa 25-250 MG tablet Commonly known as:  SINEMET IR Take 1 tablet by mouth 3 (three) times daily.   cetirizine 10 MG tablet Commonly known as:  ZYRTEC Take 10 mg by mouth at bedtime.   cyanocobalamin 500 MCG tablet Take 500 mcg by mouth daily.   desvenlafaxine 50 MG 24 hr tablet Commonly known as:  PRISTIQ Take 50 mg by mouth every other day. At supper   FLONASE 50 MCG/ACT nasal spray Generic drug:  fluticasone Place 1 spray into both nostrils daily.   gabapentin 100 MG capsule Commonly known as:  NEURONTIN Take 1 capsule (100 mg) by mouth in the morning and 2 capsules (200 mg) by mouth at night.   guaiFENesin 100 MG/5ML liquid Commonly known as:  ROBITUSSIN Take 200 mg by mouth every 4  (four) hours as needed for cough. Notify provider if cough worsens/ continues longer than 3 days.   HYDROcodone-acetaminophen 5-325 MG tablet Commonly known as:  NORCO/VICODIN Take 0.5-1 tablets by mouth every 6 (six) hours as needed for moderate pain. 1/2 tablet for moderate pain and 1 tablet for severe pain. Maximum dose of 3000 mg of APAP in 24 hours.   HYDROcodone-acetaminophen 5-325 MG tablet Commonly known as:  NORCO/VICODIN Take 1 tablet by mouth every 4 (four) hours. Hold for sedation. May give prn doses in addition to scheduled for severe pain. Max of 3000 mg/Apap in 24 hrs.   ipratropium-albuterol 0.5-2.5 (3) MG/3ML Soln Commonly known as:  DUONEB Take 3 mLs by nebulization every 6 (six) hours as needed (wheezing  and shortness of breath with cough).   lisinopril 40 MG tablet Commonly known as:  PRINIVIL,ZESTRIL TAKE ONE TABLET BY MOUTH EVERY DAY   magnesium hydroxide 400 MG/5ML suspension Commonly known as:  MILK OF MAGNESIA Take 30 mLs by mouth every 4 (four) hours as needed. If constipation/ no BM for 2 days   OXYGEN Inhale 2 L into the lungs as needed. Check O2 sat prior to placing on patient and at least every 4 hours after applying. Notify MD if O2 sat drop below baseline while receiving oxygen or no improvement in dyspnea   OXYGEN Inhale 2 L into the lungs at bedtime. For sleep apnea like behavior   pantoprazole 40 MG tablet Commonly known as:  PROTONIX Take 40 mg by mouth daily.   sennosides-docusate sodium 8.6-50 MG tablet Commonly known as:  SENOKOT-S Take 1 tablet by mouth 2 (two) times daily as needed for constipation.   sodium phosphate enema Place 1 enema rectally every three (3) days as needed. If constipation not relieved by milk of magnesia or bisacodyl suppository. Follow package directions   tamsulosin 0.4 MG Caps capsule Commonly known as:  FLOMAX Take 0.4 mg by mouth daily after supper. 30 mins after supper   XARELTO 20 MG Tabs tablet Generic  drug:  rivaroxaban TAKE 1 TABLET BY MOUTH EVERY EVENING   Zinc Oxide 10 % Oint Apply liberal amount topically to areas of skin irritation as needed. Ok to leave at bedside.       Review of Systems  Constitutional: Negative for activity change, appetite change, chills, diaphoresis, fatigue and fever.  HENT: Negative.   Eyes: Negative.   Respiratory: Negative for cough, choking, chest tightness, shortness of breath and wheezing.   Cardiovascular: Negative for chest pain (pressure), palpitations and leg swelling.  Gastrointestinal: Negative for abdominal distention, abdominal pain, constipation, diarrhea and nausea.  Genitourinary: Negative for difficulty urinating, flank pain and frequency.  Musculoskeletal: Positive for arthralgias, back pain, gait problem, joint swelling, neck pain and neck stiffness.  Skin: Negative.   Psychiatric/Behavioral: Negative.   All other systems reviewed and are negative.   Immunization History  Administered Date(s) Administered  . Influenza-Unspecified 10/20/2014, 09/10/2016  . PPD Test 12/05/2015, 11/26/2016  . Pneumococcal Polysaccharide-23 03/13/2014   Pertinent  Health Maintenance Due  Topic Date Due  . PNA vac Low Risk Adult (2 of 2 - PCV13) 03/14/2015  . INFLUENZA VACCINE  07/12/2017   No flowsheet data found. Functional Status Survey:    Vitals:   09/08/17 0923  BP: (!) 163/86  Pulse: 62  Resp: 18  Temp: (!) 97.2 F (36.2 C)  SpO2: 96%  Weight: 206 lb 6.4 oz (93.6 kg)  Height: 5' 11"  (1.803 m)   Body mass index is 28.79 kg/m. Physical Exam  Constitutional: He is oriented to person, place, and time. Vital signs are normal. He appears well-developed and well-nourished. He is active and cooperative. He does not appear ill. No distress.  HENT:  Head: Normocephalic and atraumatic.  Mouth/Throat: Uvula is midline, oropharynx is clear and moist and mucous membranes are normal. Mucous membranes are not pale, not dry and not cyanotic.    Eyes: Pupils are equal, round, and reactive to light. Conjunctivae, EOM and lids are normal.  Neck: Trachea normal, normal range of motion and full passive range of motion without pain. Neck supple. No JVD present. No tracheal deviation, no edema and no erythema present. No thyromegaly present.  Cardiovascular: Normal rate, regular rhythm, normal heart sounds, intact  distal pulses and normal pulses.  Exam reveals no gallop, no distant heart sounds and no friction rub.   No murmur heard. Pulses:      Dorsalis pedis pulses are 2+ on the right side, and 2+ on the left side.  Pulmonary/Chest: Effort normal and breath sounds normal. No accessory muscle usage. No respiratory distress. He has no decreased breath sounds. He has no wheezes. He has no rhonchi. He has no rales. He exhibits no tenderness.  Abdominal: Soft. Normal appearance and bowel sounds are normal. He exhibits no distension and no ascites. There is no tenderness.  Musculoskeletal: He exhibits no edema.  Expected osteoarthritis, stiffness, weakness  Neurological: He is alert and oriented to person, place, and time. He has normal strength. Coordination and gait abnormal.  Skin: Skin is warm, dry and intact. No rash noted. He is not diaphoretic. No cyanosis or erythema. No pallor. Nails show no clubbing.  Psychiatric: He has a normal mood and affect. His speech is normal and behavior is normal. Judgment and thought content normal. Cognition and memory are normal.  Nursing note and vitals reviewed.   Labs reviewed:  Recent Labs  09/08/16 1500 03/09/17 1408  NA 141 138  K 4.3 4.1  CL 109 107  CO2 26 25  GLUCOSE 102* 136*  BUN 18 15  CREATININE 0.96 0.94  CALCIUM 9.2 8.9    Recent Labs  09/08/16 1500 03/09/17 1408  AST 16 17  ALT 12* 13*  ALKPHOS 68 79  BILITOT 0.7 0.7  PROT 6.6 6.4*  ALBUMIN 4.3 4.0    Recent Labs  09/08/16 1500 03/09/17 1408  WBC 6.8 6.3  NEUTROABS 3.7 3.5  HGB 13.8 13.1  HCT 40.1 38.7*  MCV  96.1 97.9  PLT 138* 152   Lab Results  Component Value Date   TSH 0.892 05/18/2016   No results found for: HGBA1C No results found for: CHOL, HDL, LDLCALC, LDLDIRECT, TRIG, CHOLHDL  Significant Diagnostic Results in last 30 days:  No results found.  Assessment/Plan 1. Chronic allergic rhinitis, unspecified seasonality, unspecified trigger  Continue Cetirizine 10 mg po Q HS  Continue Fluticasone 50 mcg- 1 sprays each nostril Q day  2. Vitamin B12 deficiency  Stable   Continue Cyanocobalamin 500 mcg po Q Day  3. Constipation, chronic  Stable  Senna S 1 tablet po BID prn  Milk of Magnesia 30 ml PO Q 4 hours prn  4. Other chronic pain  Improved  Gabapentin 100 mg po Q AM  Gabapentin 200 mg po Q HS  Hydrocodone/APAP 5/325 1 tablet po Q 4 hours during waking hours  Hydrocodone/APAP 5/325 1/2-1 tablet po Q 4 hours prn  RX renewal written #120, no refill  Palliative Care following for chronic pain, sx management  5. Essential hypertension  Stable  Continue Lisinopril 40 mg po Q Day  6. Deep vein thrombosis (DVT) of left lower extremity, unspecified chronicity, unspecified vein (HCC)  Stable  Continue Xarelto 20 mg po Q day  7. MCI (mild cognitive impairment)  Stable  8. Major depressive disorder with single episode, remission status unspecified  Stable  Continue Pristiq (desvenlafaxine succinate) tablet extended release 24 hr; 50 mg every other day  9. Complete heart block [facility])  Pacemaker  Continue periodic follow up with Cardiology for management  10. Parkinson's disease (HCC)  Stable  Continue Carbidopa-Levodopa 25-250 mg- 1 tablet po TID  11. Acid reflux  Stable   Continue Protonix 40 mg po Q Day  12.  Nocturia  Stable  Continue Flomax 0.4 mg PO Q HS  13. Trochanteric bursitis of left hip  Stable   Monitor and follow up  14. Sleep apnea-like behavior  O2 2L [place] at night-time prn  If ineffective or at family request,  order sleep study  Continue all medications as listed above unless otherwise indicated  Family/ staff Communication:   Total Time:   Documentation:   Face to Face:   Family/Phone:   Labs/tests ordered:  Cbc, met c  Medication list reviewed and assessed for continued appropriateness. Monthly medication orders reviewed and signed.  Vikki Ports, NP-C Geriatrics [facility], [facility] [phone] [place],  [address] Cell Phone (Mon-Fri 8am-5pm):  [phone] On Call:  [phone] & follow prompts after 5pm & weekends Office Phone:  [phone] Office Fax:  [phone]

## 2017-09-11 ENCOUNTER — Inpatient Hospital Stay (HOSPITAL_BASED_OUTPATIENT_CLINIC_OR_DEPARTMENT_OTHER): Payer: Medicare Other | Admitting: Hematology and Oncology

## 2017-09-11 ENCOUNTER — Encounter
Admission: RE | Admit: 2017-09-11 | Discharge: 2017-09-11 | Disposition: A | Discharge status: Home / Self Care | Payer: Medicare Other | Source: Ambulatory Visit | Attending: Internal Medicine | Admitting: Internal Medicine

## 2017-09-11 ENCOUNTER — Other Ambulatory Visit: Payer: Self-pay | Admitting: *Deleted

## 2017-09-11 ENCOUNTER — Encounter: Payer: Self-pay | Admitting: Hematology and Oncology

## 2017-09-11 ENCOUNTER — Inpatient Hospital Stay: Payer: Medicare Other | Attending: Hematology and Oncology

## 2017-09-11 VITALS — BP 124/81 | HR 69 | Temp 97.0°F | Wt 206.0 lb

## 2017-09-11 DIAGNOSIS — G3184 Mild cognitive impairment, so stated: Secondary | ICD-10-CM | POA: Diagnosis not present

## 2017-09-11 DIAGNOSIS — M1990 Unspecified osteoarthritis, unspecified site: Secondary | ICD-10-CM | POA: Insufficient documentation

## 2017-09-11 DIAGNOSIS — D649 Anemia, unspecified: Secondary | ICD-10-CM | POA: Diagnosis not present

## 2017-09-11 DIAGNOSIS — E538 Deficiency of other specified B group vitamins: Secondary | ICD-10-CM

## 2017-09-11 DIAGNOSIS — G629 Polyneuropathy, unspecified: Secondary | ICD-10-CM | POA: Diagnosis not present

## 2017-09-11 DIAGNOSIS — K219 Gastro-esophageal reflux disease without esophagitis: Secondary | ICD-10-CM | POA: Insufficient documentation

## 2017-09-11 DIAGNOSIS — Z86718 Personal history of other venous thrombosis and embolism: Secondary | ICD-10-CM

## 2017-09-11 DIAGNOSIS — Z79899 Other long term (current) drug therapy: Secondary | ICD-10-CM | POA: Insufficient documentation

## 2017-09-11 DIAGNOSIS — F329 Major depressive disorder, single episode, unspecified: Secondary | ICD-10-CM | POA: Insufficient documentation

## 2017-09-11 DIAGNOSIS — Z7901 Long term (current) use of anticoagulants: Secondary | ICD-10-CM | POA: Insufficient documentation

## 2017-09-11 DIAGNOSIS — I82402 Acute embolism and thrombosis of unspecified deep veins of left lower extremity: Secondary | ICD-10-CM

## 2017-09-11 DIAGNOSIS — Z87891 Personal history of nicotine dependence: Secondary | ICD-10-CM | POA: Diagnosis not present

## 2017-09-11 DIAGNOSIS — I10 Essential (primary) hypertension: Secondary | ICD-10-CM | POA: Insufficient documentation

## 2017-09-11 DIAGNOSIS — I442 Atrioventricular block, complete: Secondary | ICD-10-CM | POA: Diagnosis not present

## 2017-09-11 DIAGNOSIS — F419 Anxiety disorder, unspecified: Secondary | ICD-10-CM | POA: Diagnosis not present

## 2017-09-11 DIAGNOSIS — Z950 Presence of cardiac pacemaker: Secondary | ICD-10-CM | POA: Insufficient documentation

## 2017-09-11 DIAGNOSIS — G20 Parkinson's disease: Secondary | ICD-10-CM | POA: Insufficient documentation

## 2017-09-11 DIAGNOSIS — Z96653 Presence of artificial knee joint, bilateral: Secondary | ICD-10-CM | POA: Diagnosis not present

## 2017-09-11 LAB — CBC WITH DIFFERENTIAL/PLATELET
Basophils Absolute: 0 10*3/uL (ref 0–0.1)
Basophils Relative: 1 %
Eosinophils Absolute: 0.3 10*3/uL (ref 0–0.7)
Eosinophils Relative: 4 %
HCT: 39.3 % — ABNORMAL LOW (ref 40.0–52.0)
Hemoglobin: 13.1 g/dL (ref 13.0–18.0)
Lymphocytes Relative: 30 %
Lymphs Abs: 1.9 10*3/uL (ref 1.0–3.6)
MCH: 33 pg (ref 26.0–34.0)
MCHC: 33.3 g/dL (ref 32.0–36.0)
MCV: 98.9 fL (ref 80.0–100.0)
Monocytes Absolute: 0.3 10*3/uL (ref 0.2–1.0)
Monocytes Relative: 5 %
Neutro Abs: 3.8 10*3/uL (ref 1.4–6.5)
Neutrophils Relative %: 60 %
Platelets: 150 10*3/uL (ref 150–440)
RBC: 3.97 MIL/uL — ABNORMAL LOW (ref 4.40–5.90)
RDW: 15.2 % — ABNORMAL HIGH (ref 11.5–14.5)
WBC: 6.3 10*3/uL (ref 3.8–10.6)

## 2017-09-11 LAB — COMPREHENSIVE METABOLIC PANEL
ALT: 6 U/L — ABNORMAL LOW (ref 17–63)
AST: 15 U/L (ref 15–41)
Albumin: 4.2 g/dL (ref 3.5–5.0)
Alkaline Phosphatase: 63 U/L (ref 38–126)
Anion gap: 5 (ref 5–15)
BUN: 22 mg/dL — ABNORMAL HIGH (ref 6–20)
CO2: 30 mmol/L (ref 22–32)
Calcium: 9.2 mg/dL (ref 8.9–10.3)
Chloride: 103 mmol/L (ref 101–111)
Creatinine, Ser: 0.98 mg/dL (ref 0.61–1.24)
GFR calc Af Amer: >60 mL/min (ref >60)
GFR calc non Af Amer: >60 mL/min (ref >60)
Glucose, Bld: 110 mg/dL — ABNORMAL HIGH (ref 65–99)
Potassium: 4.8 mmol/L (ref 3.5–5.1)
Sodium: 138 mmol/L (ref 135–145)
Total Bilirubin: 0.6 mg/dL (ref 0.3–1.2)
Total Protein: 6.7 g/dL (ref 6.5–8.1)

## 2017-09-11 LAB — FERRITIN: Ferritin: 97 ng/mL (ref 24–336)

## 2017-09-11 NOTE — Progress Notes (Signed)
[facility] day:  09/11/2017   Chief Complaint: Randy [name] is a 83 y.o. male with a history of left lower extremity deep venous thrombosis (DVT) who is seen for 6 month assessment on Xarelto.  HPI: The patient was last seen in the medical oncology clinic on 03/09/2017.  At that time, his mobility remains very limited.  Exam reveals chronic lower extremity changes.  Ferritin was 88.  He remained on Xarelto.  During the interim, patient doing "ok". Patient feels "bad" quite often. Patient weak and has a non-specific headache. Patient has not fallen or sustained any head trauma. Patient continues on his prescribed Xarelto. He denies bruising and bleeding. He is trying to walk more. He requires the assistance of a walker and gait belt. Patient eating well. He is actively trying to lose weight by reducing his portion sizes. Patient denies interval infections.       Past Medical History:  Diagnosis Date  . Anxiety   . Arthritis   . Blood transfusion    hx of autologus transfusion  . Cataract cortical, senile   . Chicken pox   . Clotting disorder ([place])   . Complete heart block ([place])   . Depression    unspecified  . DVT (deep venous thrombosis) ([place]) 09/2014   left leg  . GERD (gastroesophageal reflux disease)   . Hereditary and idiopathic neuropathy   . History of depression   . Hypertension   . MCI (mild cognitive impairment) 10/22/2013  . Memory deficit 03/24/2011  . Memory loss   . Movement disorder   . Neuromuscular disorder ([place])    peripheral  neuropathy  . Neuropathy   . Osteoarthritis   . Other hammer toe(s) (acquired), unspecified foot   . Pacemaker   . Parkinson's disease ([place])   . Parkinsonism ([place]) 10/22/2013  . Shortness of breath   . Spinal stenosis   . Synovitis and tenosynovitis, unspecified   . Third degree heart block ([place])   . Tremor     Past Surgical History:  Procedure Laterality Date  . CATARACT  EXTRACTION  06/2005  . COLONOSCOPY  06/13/2006   PH colon polyps (MUS) repeat 7/20  . CORRECTION HAMMER TOE    . defibrilator  04/04/2012   Dual Chamber  . INSERT / REPLACE / REMOVE PACEMAKER  04/04/2012  . JOINT REPLACEMENT Right    THA  . JOINT REPLACEMENT Bilateral    TKA  . KNEE ARTHROSCOPY Bilateral    Right x 2 and Left  . KNEE SURGERY  [phone]  . REPLACEMENT TOTAL HIP W/  RESURFACING IMPLANTS    . REPLACEMENT TOTAL KNEE BILATERAL  1997  . ROTATOR CUFF REPAIR  06/2008  . ROTATOR CUFF REPAIR Left 2010  . SHOULDER ARTHROSCOPY Left 06/19/2008   WITH SUBACROMIAL DECOMPRESSION. DISTAL CLAVICLE EXCISION. ARTHROSCOPIC ROTATOR CUFF REPAIR.  Marland Kitchen TOTAL HIP ARTHROPLASTY  2009   right  . UMBILICAL HERNIA REPAIR  2001  . VASECTOMY      Family History  Problem Relation Age of Onset  . Hypertension Mother   . Heart disease Mother   . Hypertension Father   . Seizures Father        epilepsy  . Heart disease Father   . Prostate cancer Other   . Cancer Other   . Hypertension Other   . Asthma Other   . Hypertension Brother   . Breast cancer Daughter     Social History:  reports  that he quit smoking about 44 years ago. His smoking use included cigarettes. He has a 25.00 pack-year smoking history. he has never used smokeless tobacco. He reports that he drinks alcohol. He reports that he does not use drugs.  He lives in [place]. They live in [place].  The patient is accompanied by his wife today.  Allergies:  Allergies  Allergen Reactions  . Citalopram     Other reaction(s): Unknown  . Codeine Nausea And Vomiting  . Sertraline     Other reaction(s): Unknown Other reaction(s): Unknown  . Lodine [Etodolac] Rash  . Percocet [Oxycodone-Acetaminophen] Rash    Tolerates tylenol Patient is able to take this recently    Current Medications: Current Outpatient Medications  Medication Sig Dispense Refill  . acetaminophen (TYLENOL) 325 MG tablet Take 650 mg by mouth 4  (four) times daily as needed. Max of 3000 mg of Apap in 24 hrs    . carbidopa-levodopa (SINEMET IR) 25-250 MG tablet Take 1 tablet by mouth 3 (three) times daily.    . cetirizine (ZYRTEC) 10 MG tablet Take 10 mg by mouth at bedtime.    . cyanocobalamin 500 MCG tablet Take 500 mcg by mouth daily.    Marland Kitchen desvenlafaxine (PRISTIQ) 50 MG 24 hr tablet Take 50 mg by mouth every other day. At supper    . fluticasone (FLONASE) 50 MCG/ACT nasal spray Place 1 spray into both nostrils daily.     Marland Kitchen gabapentin (NEURONTIN) 100 MG capsule Take 1 capsule (100 mg) by mouth in the morning and 2 capsules (200 mg) by mouth at night.    Marland Kitchen guaiFENesin (ROBITUSSIN) 100 MG/5ML liquid Take 200 mg by mouth every 4 (four) hours as needed for cough. Notify provider if cough worsens/ continues longer than 3 days.    Marland Kitchen ipratropium-albuterol (DUONEB) 0.5-2.5 (3) MG/3ML SOLN Take 3 mLs by nebulization every 6 (six) hours as needed (wheezing and shortness of breath with cough).    Marland Kitchen lisinopril (PRINIVIL,ZESTRIL) 40 MG tablet TAKE ONE TABLET BY MOUTH EVERY DAY 30 tablet 3  . magnesium hydroxide (MILK OF MAGNESIA) 400 MG/5ML suspension Take 30 mLs by mouth every 4 (four) hours as needed. If constipation/ no BM for 2 days    . OXYGEN Inhale 2 L into the lungs as needed. Check O2 sat prior to placing on patient and at least every 4 hours after applying. Notify MD if O2 sat drop below baseline while receiving oxygen or no improvement in dyspnea    . OXYGEN Inhale 2 L into the lungs at bedtime. For sleep apnea like behavior    . pantoprazole (PROTONIX) 40 MG tablet Take 40 mg by mouth daily.    . sennosides-docusate sodium (SENOKOT-S) 8.6-50 MG tablet Take 1 tablet by mouth 2 (two) times daily as needed for constipation.    . sodium phosphate (FLEET) enema Place 1 enema rectally every three (3) days as needed. If constipation not relieved by milk of magnesia or bisacodyl suppository. Follow package directions    . tamsulosin (FLOMAX) 0.4 MG  CAPS capsule Take 0.4 mg by mouth daily after supper. 30 mins after supper    . Zinc Oxide 10 % OINT Apply liberal amount topically to areas of skin irritation as needed. Ok to leave at bedside.    . bisacodyl (BISCOLAX) 10 MG suppository Place 10 mg rectally 2 (two) times daily as needed for moderate constipation. Give until BM.    Marland Kitchen HYDROcodone-acetaminophen (NORCO/VICODIN) 5-325 MG tablet Take 0.5-1 tablets by  mouth every 4 (four) hours as needed for moderate pain or severe pain. Give 1/2 tablet for moderate pain and give 1 tablet for severe pain. Max dose of 3000 mg / 24 hr 90 tablet 0  . HYDROcodone-acetaminophen (NORCO/VICODIN) 5-325 MG tablet Give 2 half tablets = 1 whole tablet by mouth every 4 hours for pain. Hold for sedation. May giveprn doses in addition to scheduled for severe pain.    . sorbitol 70 % solution Take 30 mLs by mouth every 2 (two) hours as needed. Until pt has large BM    . XARELTO 20 MG TABS tablet TAKE 1 TABLET BY MOUTH EVERY EVENING 30 tablet 1   No current facility-administered medications for this visit.     Review of Systems:  GENERAL:  Feels "ok".  Limited mobility.  Weak.  No fevers or sweats.  Weight down 2 pounds. PERFORMANCE STATUS (ECOG):  3 HEENT:  No visual changes, runny nose, sore throat, mouth sores or tenderness. Lungs: No shortness of breath or cough.  No hemoptysis. Cardiac:  No chest pain, palpitations, orthopnea, or PND. GI:  Eating well.  No nausea, vomiting, diarrhea, constipation, melena or hematochezia. GU:  No urgency, frequency, dysuria, or hematuria. Musculoskeletal:  Back and hip pain (positional).  No muscle tenderness. Extremities:  No pain or swelling. Skin:  No rashes or skin changes. Neuro:  Decreased ability to walk.  Headaches.  No numbness, balance or coordination issues. Endocrine:  No diabetes, thyroid issues, hot flashes or night sweats. Psych:  No mood changes, depression or anxiety.  Days and nights mixed up. Pain:  No  focal pain. Review of systems:  All other systems reviewed and found to be negative.  Physical Exam: Blood pressure 124/81, pulse 69, temperature (!) 97 F (36.1 C), weight 206 lb (93.4 kg). GENERAL:  Well developed, well nourished, gentleman sitting comfortably in a wheelchair in the exam room in no acute distress.  He a gait belt on.  His head is down. MENTAL STATUS:  Alert and oriented to person, place and time. HEAD:  Thin white hair.  Normocephalic, atraumatic, face symmetric, no Cushingoid features. EYES:  Glasses.  Blue eyes.  Pupils equal round and reactive to light and accomodation.  No conjunctivitis or scleral icterus. ENT:  Oropharynx clear without lesion.  Tongue normal. Mucous membranes moist.  RESPIRATORY:  Poor respiratory excursion.  Clear to auscultation without rales, wheezes or rhonchi. CARDIOVASCULAR:  Regular rate and rhythm without murmur, rub or gallop. ABDOMEN:  Soft, non-tender, with active bowel sounds, and no hepatosplenomegaly.  No masses. SKIN:  No rashes, ulcers or lesions. EXTREMITIES:  Bilateral trace to 1+ lower extremity edema.  No skin discoloration or tenderness.  No palpable cords. LYMPH NODES:  No palpable cervical, supraclavicular, axillary or inguinal adenopathy  NEUROLOGICAL:  Unremarkable. PSYCH:  Appropriate.   Appointment on 09/11/2017  Component Date Value Ref Range Status  . Sodium 09/11/2017 138  135 - 145 mmol/L Final  . Potassium 09/11/2017 4.8  3.5 - 5.1 mmol/L Final  . Chloride 09/11/2017 103  101 - 111 mmol/L Final  . CO2 09/11/2017 30  22 - 32 mmol/L Final  . Glucose, Bld 09/11/2017 110* 65 - 99 mg/dL Final  . BUN 09/11/2017 22* 6 - 20 mg/dL Final  . Creatinine, Ser 09/11/2017 0.98  0.61 - 1.24 mg/dL Final  . Calcium 09/11/2017 9.2  8.9 - 10.3 mg/dL Final  . Total Protein 09/11/2017 6.7  6.5 - 8.1 g/dL Final  . Albumin 09/11/2017  4.2  3.5 - 5.0 g/dL Final  . AST 09/11/2017 15  15 - 41 U/L Final  . ALT 09/11/2017 6* 17 - 63 U/L  Final  . Alkaline Phosphatase 09/11/2017 63  38 - 126 U/L Final  . Total Bilirubin 09/11/2017 0.6  0.3 - 1.2 mg/dL Final  . GFR calc non Af Amer 09/11/2017 >60  >60 mL/min Final  . GFR calc Af Amer 09/11/2017 >60  >60 mL/min Final   Comment: (NOTE) The eGFR has been calculated using the CKD EPI equation. This calculation has not been validated in all clinical situations. eGFR's persistently <60 mL/min signify possible Chronic Kidney Disease.   . Anion gap 09/11/2017 5  5 - 15 Final  . WBC 09/11/2017 6.3  3.8 - 10.6 K/uL Final  . RBC 09/11/2017 3.97* 4.40 - 5.90 MIL/uL Final  . Hemoglobin 09/11/2017 13.1  13.0 - 18.0 g/dL Final  . HCT 09/11/2017 39.3* 40.0 - 52.0 % Final  . MCV 09/11/2017 98.9  80.0 - 100.0 fL Final  . MCH 09/11/2017 33.0  26.0 - 34.0 pg Final  . MCHC 09/11/2017 33.3  32.0 - 36.0 g/dL Final  . RDW 09/11/2017 15.2* 11.5 - 14.5 % Final  . Platelets 09/11/2017 150  150 - 440 K/uL Final  . Neutrophils Relative % 09/11/2017 60  % Final  . Neutro Abs 09/11/2017 3.8  1.4 - 6.5 K/uL Final  . Lymphocytes Relative 09/11/2017 30  % Final  . Lymphs Abs 09/11/2017 1.9  1.0 - 3.6 K/uL Final  . Monocytes Relative 09/11/2017 5  % Final  . Monocytes Absolute 09/11/2017 0.3  0.2 - 1.0 K/uL Final  . Eosinophils Relative 09/11/2017 4  % Final  . Eosinophils Absolute 09/11/2017 0.3  0 - 0.7 K/uL Final  . Basophils Relative 09/11/2017 1  % Final  . Basophils Absolute 09/11/2017 0.0  0 - 0.1 K/uL Final  . Ferritin 09/11/2017 97  24 - 336 ng/mL Final    Assessment:  Randy [name] is a 83 y.o. male with Parkinson's and a history of left lower extremity DVT in the setting of immobility and possible dehydration in 09/2014.  He remains on Xarelto secondary to his persistent immobility.  Hypercoagulable work-up on 03/17/2015 revealed the following normal studies:  Factor V Leiden, prothrombin gene mutation, lupus anticoagulant, and anticardiolipin antibodies.  He has no evidence of a  myeloproliferative disorder.    Left lower extremity duplex on 03/17/2015 revealed interval improvement in the left femoral popliteal DVT.  Thrombus was nonocclusive.    He has thrombocytopenia possibly related to Mebetiq (?).  Platelet count ranges between 141,000 and 172,000.  He has a history of a mild normocytic anemia.  Diet is good.  B12 was low normal on 02/11/2016.  MMA was normal on 09/08/2016 thus r/o B12 deficiency.  B12 was 1051 on 03/09/2017.  He denies any melena or hematochezia.    Ferritin has been followed:  151 on 02/11/2016, 179 on 05/27/2016, 76 on 09/08/2016, 88 on 03/09/2017, and 97 on 09/11/2017.  Symptomatically, his mobility remains very limited.  Exam stable.  CBC and chemistries unremarkable.   Plan: 1.  Labs today:  CBC with diff, CMP, ferritin. 2.  Continue Xarelto as previously prescribed. 3.  Increase oral fluid intake.   4.  RTC in 6 months for MD assessment and labs (CBC with diff, CMP, ferritin, B12).   Honor Loh, NP  09/11/2017, 3:27 PM   I saw and evaluated the patient, participating in the  key portions of the service and reviewing pertinent diagnostic studies and records.  I reviewed the nurse practitioner's note and agree with the findings and the plan.  The assessment and plan were discussed with the patient.  A few questions were asked by the patient and answered.   Nolon Stalls, MD 09/11/2017, 3:27 PM

## 2017-09-13 ENCOUNTER — Other Ambulatory Visit: Payer: Self-pay | Admitting: Hematology and Oncology

## 2017-09-15 LAB — CUP PACEART INCLINIC DEVICE CHECK
Brady Statistic AP VS Percent: 0 %
Brady Statistic AS VS Percent: 0 %
Implantable Lead Implant Date: 20130424
Implantable Lead Location: 753859
Implantable Lead Model: 5076
Lead Channel Impedance Value: 562 Ohm
Lead Channel Impedance Value: 708 Ohm
Lead Channel Pacing Threshold Pulse Width: 0.4 ms
Lead Channel Sensing Intrinsic Amplitude: 4 mV
Lead Channel Setting Pacing Amplitude: 2.5 V
MDC IDC LEAD IMPLANT DT: 20130424
MDC IDC LEAD LOCATION: 753860
MDC IDC MSMT BATTERY IMPEDANCE: 936 Ohm
MDC IDC MSMT BATTERY REMAINING LONGEVITY: 62 mo
MDC IDC MSMT BATTERY VOLTAGE: 2.79 V
MDC IDC MSMT LEADCHNL RA PACING THRESHOLD AMPLITUDE: 0.5 V
MDC IDC MSMT LEADCHNL RV PACING THRESHOLD AMPLITUDE: 0.75 V
MDC IDC MSMT LEADCHNL RV PACING THRESHOLD PULSEWIDTH: 0.4 ms
MDC IDC PG IMPLANT DT: 20130424
MDC IDC SESS DTM: 20180925152629
MDC IDC SET LEADCHNL RA PACING AMPLITUDE: 1.5 V
MDC IDC SET LEADCHNL RV PACING PULSEWIDTH: 0.4 ms
MDC IDC SET LEADCHNL RV SENSING SENSITIVITY: 4 mV
MDC IDC STAT BRADY AP VP PERCENT: 38 %
MDC IDC STAT BRADY AS VP PERCENT: 61 %

## 2017-09-19 ENCOUNTER — Other Ambulatory Visit
Admission: RE | Admit: 2017-09-19 | Discharge: 2017-09-19 | Disposition: A | Discharge status: Home / Self Care | Payer: Medicare Other | Source: Ambulatory Visit | Attending: Internal Medicine | Admitting: Internal Medicine

## 2017-09-19 DIAGNOSIS — R350 Frequency of micturition: Secondary | ICD-10-CM | POA: Insufficient documentation

## 2017-09-19 LAB — URINALYSIS, COMPLETE (UACMP) WITH MICROSCOPIC
BACTERIA UA: NONE SEEN
Bilirubin Urine: NEGATIVE
GLUCOSE, UA: NEGATIVE mg/dL
Hgb urine dipstick: NEGATIVE
KETONES UR: NEGATIVE mg/dL
LEUKOCYTES UA: NEGATIVE
NITRITE: NEGATIVE
PH: 7 (ref 5.0–8.0)
Protein, ur: NEGATIVE mg/dL
RBC / HPF: NONE SEEN RBC/hpf (ref 0–5)
SPECIFIC GRAVITY, URINE: 1.013 (ref 1.005–1.030)
SQUAMOUS EPITHELIAL / LPF: NONE SEEN

## 2017-09-20 ENCOUNTER — Other Ambulatory Visit: Payer: Self-pay

## 2017-09-20 LAB — URINE CULTURE: Culture: NO GROWTH

## 2017-09-20 MED ORDER — HYDROCODONE-ACETAMINOPHEN 5-325 MG PO TABS: 0.5000 | ORAL_TABLET | ORAL | 0 refills | Status: DC | PRN

## 2017-09-20 MED ORDER — HYDROCODONE-ACETAMINOPHEN 5-325 MG PO TABS: 1.0000 | ORAL_TABLET | ORAL | 0 refills | Status: DC

## 2017-09-20 NOTE — Telephone Encounter (Signed)
Rx sent to [facility] phone : [phone] , fax : [phone]  

## 2017-10-04 ENCOUNTER — Other Ambulatory Visit: Payer: Self-pay

## 2017-10-04 MED ORDER — HYDROCODONE-ACETAMINOPHEN 5-325 MG PO TABS: 1.0000 | ORAL_TABLET | ORAL | 0 refills | Status: DC

## 2017-10-04 MED ORDER — HYDROCODONE-ACETAMINOPHEN 5-325 MG PO TABS: 0.5000 | ORAL_TABLET | ORAL | 0 refills | Status: DC | PRN

## 2017-10-04 NOTE — Telephone Encounter (Signed)
Rx sent to [facility] phone : [phone] , fax : [phone]  

## 2017-10-12 ENCOUNTER — Encounter
Admission: RE | Admit: 2017-10-12 | Discharge: 2017-10-12 | Disposition: A | Discharge status: Home / Self Care | Payer: Medicare Other | Source: Ambulatory Visit | Attending: Internal Medicine | Admitting: Internal Medicine

## 2017-10-12 ENCOUNTER — Non-Acute Institutional Stay (SKILLED_NURSING_FACILITY): Payer: Medicare Other | Admitting: Gerontology

## 2017-10-12 ENCOUNTER — Encounter: Payer: Self-pay | Admitting: Gerontology

## 2017-10-12 DIAGNOSIS — K5909 Other constipation: Secondary | ICD-10-CM

## 2017-10-12 DIAGNOSIS — I442 Atrioventricular block, complete: Secondary | ICD-10-CM | POA: Diagnosis not present

## 2017-10-12 DIAGNOSIS — G20A1 Parkinson's disease without dyskinesia, without mention of fluctuations: Secondary | ICD-10-CM

## 2017-10-12 DIAGNOSIS — G20 Parkinson's disease: Secondary | ICD-10-CM

## 2017-10-12 DIAGNOSIS — E538 Deficiency of other specified B group vitamins: Secondary | ICD-10-CM | POA: Diagnosis not present

## 2017-10-12 DIAGNOSIS — G3184 Mild cognitive impairment, so stated: Secondary | ICD-10-CM | POA: Diagnosis not present

## 2017-10-12 DIAGNOSIS — I10 Essential (primary) hypertension: Secondary | ICD-10-CM | POA: Diagnosis not present

## 2017-10-12 DIAGNOSIS — F329 Major depressive disorder, single episode, unspecified: Secondary | ICD-10-CM

## 2017-10-12 DIAGNOSIS — R351 Nocturia: Secondary | ICD-10-CM | POA: Diagnosis not present

## 2017-10-12 DIAGNOSIS — I82402 Acute embolism and thrombosis of unspecified deep veins of left lower extremity: Secondary | ICD-10-CM | POA: Diagnosis not present

## 2017-10-12 DIAGNOSIS — K219 Gastro-esophageal reflux disease without esophagitis: Secondary | ICD-10-CM

## 2017-10-12 DIAGNOSIS — G8929 Other chronic pain: Secondary | ICD-10-CM

## 2017-10-12 DIAGNOSIS — J309 Allergic rhinitis, unspecified: Secondary | ICD-10-CM

## 2017-10-24 ENCOUNTER — Other Ambulatory Visit
Admission: RE | Admit: 2017-10-24 | Discharge: 2017-10-24 | Disposition: A | Discharge status: Home / Self Care | Payer: Medicare Other | Source: Ambulatory Visit | Attending: Internal Medicine | Admitting: Internal Medicine

## 2017-10-24 DIAGNOSIS — R319 Hematuria, unspecified: Secondary | ICD-10-CM | POA: Insufficient documentation

## 2017-10-24 DIAGNOSIS — R3915 Urgency of urination: Secondary | ICD-10-CM | POA: Insufficient documentation

## 2017-10-24 LAB — URINALYSIS, COMPLETE (UACMP) WITH MICROSCOPIC
BILIRUBIN URINE: NEGATIVE
Bacteria, UA: NONE SEEN
GLUCOSE, UA: NEGATIVE mg/dL
Ketones, ur: 5 mg/dL — AB
Leukocytes, UA: NEGATIVE
NITRITE: NEGATIVE
PROTEIN: NEGATIVE mg/dL
SPECIFIC GRAVITY, URINE: 1.023 (ref 1.005–1.030)
Squamous Epithelial / LPF: NONE SEEN
pH: 6 (ref 5.0–8.0)

## 2017-10-25 NOTE — Progress Notes (Signed)
[facility]   [facility] Place of Service:  SNF [phone]) Provider:  Lorenso QuarryShannon Malonie Tatum, NP-C  Jerl MinaHedrick, James, MD  Patient Care Team: Jerl MinaHedrick, James, MD as PCP - General (Family Medicine)  Extended Emergency Contact Information Primary Emergency Contact: Marva PandaGilliam,Polly  [place] Home Phone: [phone] Mobile Phone: [phone] Relation: Spouse Secondary Emergency Contact: Gwynneth MunsonGilliam,Jeffery  [place] Mobile Phone: [phone] Relation: Son  Code Status:  DNR Goals of care: Advanced Directive information Advanced Directives 10/12/2017  Does Patient Have a Medical Advance Directive? Yes  Type of Advance Directive Out of facility DNR (pink MOST or yellow form)  Does patient want to make changes to medical advance directive? No - Patient declined  Copy of Healthcare Power of Attorney in Chart? -  Would patient like information on creating a medical advance directive? -  Pre-existing out of facility DNR order (yellow form or pink MOST form) -     Chief Complaint  Patient presents with  . Medical Management of Chronic Issues    Routine Visit    HPI:  Pt is a 83 y.o. male seen today for medical management of chronic diseases. Pt reports he generally has been doing well. He does report his pain is better controlled at this point with recent increase in scheduled medications . He reports he is sleeping better, as well. His Parkinson's symptoms are controlled with medications. He now denies visual hallucinations. Parkinson's meds are being managed by his neurologist. He denies chest pain or shortness of breath. No known issues with the pacemaker. Pt is being followed by cardiology for the heart block. Blood pressures are stable. No signs of bleeding from the chronic use of Xarelto d/t chronic DVT of Left Iliac Vein. Pulses equal. Symptoms of chronic allergic rhinitis, reflux and constipation controlled/stable. Continues  Vitamin B12 supplementation d/t deficiency. Pt is mobile with wheelchair. Appetite is good. Having regular BM's. No falls this month. VSS. No other complaints.   Past Medical History:  Diagnosis Date  . Anxiety   . Arthritis   . Blood transfusion    hx of autologus transfusion  . Cataract cortical, senile   . Chicken pox   . Clotting disorder (HCC)   . Complete heart block (HCC)   . Depression    unspecified  . DVT (deep venous thrombosis) (HCC) 09/2014   left leg  . GERD (gastroesophageal reflux disease)   . Hereditary and idiopathic neuropathy   . History of depression   . Hypertension   . MCI (mild cognitive impairment) 10/22/2013  . Memory deficit 03/24/2011  . Memory loss   . Movement disorder   . Neuromuscular disorder (HCC)    peripheral  neuropathy  . Neuropathy   . Osteoarthritis   . Other hammer toe(s) (acquired), unspecified foot   . Pacemaker   . Parkinson's disease (HCC)   . Parkinsonism (HCC) 10/22/2013  . Shortness of breath   . Spinal stenosis   . Synovitis and tenosynovitis, unspecified   . Third degree heart block (HCC)   . Tremor    Past Surgical History:  Procedure Laterality Date  . CATARACT EXTRACTION  06/2005  . COLONOSCOPY  06/13/2006   PH colon polyps (MUS) repeat 7/20  . CORRECTION HAMMER TOE    . defibrilator  04/04/2012   Dual Chamber  . INSERT / REPLACE / REMOVE PACEMAKER  04/04/2012  . JOINT REPLACEMENT Right    THA  . JOINT REPLACEMENT Bilateral  TKA  . KNEE ARTHROSCOPY Bilateral    Right x 2 and Left  . KNEE SURGERY  [phone],[id number],1996  . REPLACEMENT TOTAL HIP W/  RESURFACING IMPLANTS    . REPLACEMENT TOTAL KNEE BILATERAL  1997  . ROTATOR CUFF REPAIR  06/2008  . ROTATOR CUFF REPAIR Left 2010  . SHOULDER ARTHROSCOPY Left 06/19/2008   WITH SUBACROMIAL DECOMPRESSION. DISTAL CLAVICLE EXCISION. ARTHROSCOPIC ROTATOR CUFF REPAIR.  Marland Kitchen. TOTAL HIP ARTHROPLASTY  2009   right  . UMBILICAL HERNIA REPAIR  2001  . VASECTOMY      Allergies    Allergen Reactions  . Citalopram     Other reaction(s): Unknown  . Codeine Nausea And Vomiting  . Sertraline     Other reaction(s): Unknown Other reaction(s): Unknown  . Lodine [Etodolac] Rash  . Percocet [Oxycodone-Acetaminophen] Rash    Tolerates tylenol Patient is able to take this recently    Allergies as of 10/12/2017      Reactions   Citalopram    Other reaction(s): Unknown   Codeine Nausea And Vomiting   Sertraline    Other reaction(s): Unknown Other reaction(s): Unknown   Lodine [etodolac] Rash   Percocet [oxycodone-acetaminophen] Rash   Tolerates tylenol Patient is able to take this recently      Medication List        Accurate as of 10/12/17 11:59 PM. Always use your most recent med list.          acetaminophen 325 MG tablet Commonly known as:  TYLENOL Take 650 mg by mouth 4 (four) times daily as needed. Max of 3000 mg of Apap in 24 hrs   BISCOLAX 10 MG suppository Generic drug:  bisacodyl Place 10 mg rectally 2 (two) times daily as needed for moderate constipation. Give until BM.   carbidopa-levodopa 25-250 MG tablet Commonly known as:  SINEMET IR Take 1 tablet by mouth 3 (three) times daily.   cetirizine 10 MG tablet Commonly known as:  ZYRTEC Take 10 mg by mouth at bedtime.   cyanocobalamin 500 MCG tablet Take 500 mcg by mouth daily.   desvenlafaxine 50 MG 24 hr tablet Commonly known as:  PRISTIQ Take 50 mg by mouth every other day. At supper   FLONASE 50 MCG/ACT nasal spray Generic drug:  fluticasone Place 1 spray into both nostrils daily.   gabapentin 100 MG capsule Commonly known as:  NEURONTIN Take 1 capsule (100 mg) by mouth in the morning and 2 capsules (200 mg) by mouth at night.   guaiFENesin 100 MG/5ML liquid Commonly known as:  ROBITUSSIN Take 200 mg by mouth every 4 (four) hours as needed for cough. Notify provider if cough worsens/ continues longer than 3 days.   HYDROcodone-acetaminophen 5-325 MG tablet Commonly known  as:  NORCO/VICODIN Give 2 half tablets = 1 whole tablet by mouth every 4 hours for pain. Hold for sedation. May giveprn doses in addition to scheduled for severe pain.   HYDROcodone-acetaminophen 5-325 MG tablet Commonly known as:  NORCO/VICODIN Take 0.5-1 tablets by mouth every 4 (four) hours as needed for moderate pain or severe pain. Give 1/2 tablet for moderate pain and give 1 tablet for severe pain. Max dose of 3000 mg / 24 hr   ipratropium-albuterol 0.5-2.5 (3) MG/3ML Soln Commonly known as:  DUONEB Take 3 mLs by nebulization every 6 (six) hours as needed (wheezing and shortness of breath with cough).   lisinopril 40 MG tablet Commonly known as:  PRINIVIL,ZESTRIL TAKE ONE TABLET BY MOUTH EVERY DAY  magnesium hydroxide 400 MG/5ML suspension Commonly known as:  MILK OF MAGNESIA Take 30 mLs by mouth every 4 (four) hours as needed. If constipation/ no BM for 2 days   OXYGEN Inhale 2 L into the lungs as needed. Check O2 sat prior to placing on patient and at least every 4 hours after applying. Notify MD if O2 sat drop below baseline while receiving oxygen or no improvement in dyspnea   OXYGEN Inhale 2 L into the lungs at bedtime. For sleep apnea like behavior   pantoprazole 40 MG tablet Commonly known as:  PROTONIX Take 40 mg by mouth daily.   sennosides-docusate sodium 8.6-50 MG tablet Commonly known as:  SENOKOT-S Take 1 tablet by mouth 2 (two) times daily as needed for constipation.   sodium phosphate enema Place 1 enema rectally every three (3) days as needed. If constipation not relieved by milk of magnesia or bisacodyl suppository. Follow package directions   sorbitol 70 % solution Take 30 mLs by mouth every 2 (two) hours as needed. Until pt has large BM   tamsulosin 0.4 MG Caps capsule Commonly known as:  FLOMAX Take 0.4 mg by mouth daily after supper. 30 mins after supper   XARELTO 20 MG Tabs tablet Generic drug:  rivaroxaban TAKE 1 TABLET BY MOUTH EVERY  EVENING   Zinc Oxide 10 % Oint Apply liberal amount topically to areas of skin irritation as needed. Ok to leave at bedside.       Review of Systems  Constitutional: Negative for activity change, appetite change, chills, diaphoresis, fatigue and fever.  HENT: Negative.   Eyes: Negative.   Respiratory: Negative for cough, choking, chest tightness, shortness of breath and wheezing.   Cardiovascular: Negative for chest pain (pressure), palpitations and leg swelling.  Gastrointestinal: Negative for abdominal distention, abdominal pain, constipation, diarrhea and nausea.  Genitourinary: Negative for difficulty urinating, flank pain and frequency.  Musculoskeletal: Positive for arthralgias, back pain, gait problem, joint swelling, neck pain and neck stiffness.  Skin: Negative.   Psychiatric/Behavioral: Negative.   All other systems reviewed and are negative.   Immunization History  Administered Date(s) Administered  . Influenza-Unspecified 10/20/2014, 09/10/2016, 09/02/2017  . PPD Test 12/05/2015, 11/26/2016  . Pneumococcal Polysaccharide-23 03/13/2014   Pertinent  Health Maintenance Due  Topic Date Due  . PNA vac Low Risk Adult (2 of 2 - PCV13) 03/14/2015  . INFLUENZA VACCINE  Completed   No flowsheet data found. Functional Status Survey:    Vitals:   10/12/17 1431  BP: 126/65  Pulse: 66  Resp: 16  Temp: (!) 97.4 F (36.3 C)  SpO2: 98%  Weight: 188 lb (85.3 kg)  Height: 5\' 11"  (1.803 m)   Body mass index is 26.22 kg/m. Physical Exam  Constitutional: He is oriented to person, place, and time. Vital signs are normal. He appears well-developed and well-nourished. He is active and cooperative. He does not appear ill. No distress.  HENT:  Head: Normocephalic and atraumatic.  Mouth/Throat: Uvula is midline, oropharynx is clear and moist and mucous membranes are normal. Mucous membranes are not pale, not dry and not cyanotic.  Eyes: Conjunctivae, EOM and lids are normal.  Pupils are equal, round, and reactive to light.  Neck: Trachea normal, normal range of motion and full passive range of motion without pain. Neck supple. No JVD present. No tracheal deviation, no edema and no erythema present. No thyromegaly present.  Cardiovascular: Normal rate, regular rhythm, normal heart sounds, intact distal pulses and normal pulses. Exam reveals  no gallop, no distant heart sounds and no friction rub.  No murmur heard. Pulses:      Dorsalis pedis pulses are 2+ on the right side, and 2+ on the left side.  No edema  Pulmonary/Chest: Effort normal and breath sounds normal. No accessory muscle usage. No respiratory distress. He has no decreased breath sounds. He has no wheezes. He has no rhonchi. He has no rales. He exhibits no tenderness.  Abdominal: Soft. Normal appearance and bowel sounds are normal. He exhibits no distension and no ascites. There is no tenderness.  Musculoskeletal: He exhibits no edema.  Expected osteoarthritis, stiffness, weakness  Neurological: He is alert and oriented to person, place, and time. He has normal strength. Coordination and gait abnormal.  Skin: Skin is warm, dry and intact. No rash noted. He is not diaphoretic. No cyanosis or erythema. No pallor. Nails show no clubbing.  Psychiatric: He has a normal mood and affect. His speech is normal and behavior is normal. Judgment and thought content normal. Cognition and memory are normal.  Nursing note and vitals reviewed.   Labs reviewed: Recent Labs    03/09/17 1408 09/11/17 1506  NA 138 138  K 4.1 4.8  CL 107 103  CO2 25 30  GLUCOSE 136* 110*  BUN 15 22*  CREATININE 0.94 0.98  CALCIUM 8.9 9.2   Recent Labs    03/09/17 1408 09/11/17 1506  AST 17 15  ALT 13* 6*  ALKPHOS 79 63  BILITOT 0.7 0.6  PROT 6.4* 6.7  ALBUMIN 4.0 4.2   Recent Labs    03/09/17 1408 09/11/17 1506  WBC 6.3 6.3  NEUTROABS 3.5 3.8  HGB 13.1 13.1  HCT 38.7* 39.3*  MCV 97.9 98.9  PLT 152 150   Lab  Results  Component Value Date   TSH 0.892 05/18/2016   No results found for: HGBA1C No results found for: CHOL, HDL, LDLCALC, LDLDIRECT, TRIG, CHOLHDL  Significant Diagnostic Results in last 30 days:  No results found.  Assessment/Plan 1. Chronic allergic rhinitis, unspecified seasonality, unspecified trigger  Continue Cetirizine 10 mg po Q HS  Continue Fluticasone 50 mcg- 1 sprays each nostril Q day  2. Vitamin B12 deficiency  Stable   Continue Cyanocobalamin 500 mcg po Q Day  3. Constipation, chronic  Stable  Senna S 1 tablet po BID prn  Milk of Magnesia 30 ml PO Q 4 hours prn  Sorbitol 70%- 30 mL PO Q 2 hours prn until BM  4. Other chronic pain  Improved  Gabapentin 100 mg po Q AM  Gabapentin 200 mg po Q HS  Hydrocodone/APAP 5/325 1 tablet po Q 4 hours during waking hours  Hydrocodone/APAP 5/325 1/2-1 tablet po Q 4 hours prn  RX renewal written #120, no refill  Palliative Care following for chronic pain, sx management  5. Essential hypertension  Stable  Continue Lisinopril 40 mg po Q Day  6. Deep vein thrombosis (DVT) of left lower extremity, unspecified chronicity, unspecified vein (HCC)  Stable  Continue Xarelto 20 mg po Q day  7. MCI (mild cognitive impairment)  Stable  8. Major depressive disorder with single episode, remission status unspecified  Stable  Continue Pristiq (desvenlafaxine succinate) tablet extended release 24 hr; 50 mg every other day  9. Complete heart block [facility])  Pacemaker  Continue periodic follow up with Cardiology for management  10. Parkinson's disease (HCC)  Stable  Continue Carbidopa-Levodopa 25-250 mg- 1 tablet po TID  11. Acid reflux  Stable  Continue Protonix 40 mg po Q Day  12. Nocturia  Stable  Continue Flomax 0.4 mg PO Q HS   Continue all medications as listed above   Family/ staff Communication:   Total Time:   Documentation:   Face to Face:   Family/Phone:   Labs/tests  ordered:    Medication list reviewed and assessed for continued appropriateness. Monthly medication orders reviewed and signed.  Brynda Rim, NP-C [facility] [phone] [address], [place] [address] Cell Phone (Mon-Fri 8am-5pm):  [phone] On Call:  [phone] & follow prompts after 5pm & weekends Office Phone:  [phone] Office Fax:  [phone]

## 2017-10-26 ENCOUNTER — Non-Acute Institutional Stay (SKILLED_NURSING_FACILITY): Payer: Medicare Other | Admitting: Gerontology

## 2017-10-26 ENCOUNTER — Encounter: Payer: Self-pay | Admitting: Gerontology

## 2017-10-26 ENCOUNTER — Telehealth: Payer: Self-pay | Admitting: Cardiology

## 2017-10-26 ENCOUNTER — Ambulatory Visit (INDEPENDENT_AMBULATORY_CARE_PROVIDER_SITE_OTHER): Payer: Medicare Other | Admitting: *Deleted

## 2017-10-26 DIAGNOSIS — N3001 Acute cystitis with hematuria: Secondary | ICD-10-CM

## 2017-10-26 DIAGNOSIS — I442 Atrioventricular block, complete: Secondary | ICD-10-CM | POA: Diagnosis not present

## 2017-10-26 DIAGNOSIS — R4182 Altered mental status, unspecified: Secondary | ICD-10-CM

## 2017-10-26 LAB — URINE CULTURE

## 2017-10-26 NOTE — Telephone Encounter (Signed)
LMOVM reminding pt to send remote transmission.   

## 2017-10-26 NOTE — Progress Notes (Signed)
Location:   [facility] Room Number: 336P Place of Service:  SNF (31) Provider:  Shrita Thien, NP-C  Hedrick, James, MD  Patient Care Team: Hedrick, James, MD as PCP - General (Family Medicine)  Extended Emergency Contact Information Primary Emergency Contact: Doty,Polly  [place] Home Phone: [phone] Mobile Phone: [phone] Relation: Spouse Secondary Emergency Contact: Benko,Jeffery  [place] Mobile Phone: [phone] Relation: Son  Code Status:  DNR Goals of care: Advanced Directive information Advanced Directives 10/26/2017  Does Patient Have a Medical Advance Directive? Yes  Type of Advance Directive Out of facility DNR (pink MOST or yellow form)  Does patient want to make changes to medical advance directive? No - Patient declined  Copy of Healthcare Power of Attorney in Chart? -  Would patient like information on creating a medical advance directive? -  Pre-existing out of facility DNR order (yellow form or pink MOST form) -     Chief Complaint  Patient presents with  . Acute Visit    Recheck patient for altered mental status    HPI:  Pt is a 83 y.o. male seen today for an acute visit for altered mental status.  Patient is confused with increased sedation.  Appetite is decreased.  Decreased urine output.  Patient is falling asleep in the middle of conversations. Urine sample was obtained and sent to the lab 2 days ago.   He was found to have a E. coli UTI   And was started on p.o. Cipro.  Denies CV or suprapubic pain or tenderness.  Due to today's assessment findings with with poor p.o. fluid intake, I will give a few days of gentle IV fluids for hydration support.  Wife agreeable with plan.  Patient has been afebrile.  Vital signs stable.  No other complaints.   Past Medical History:  Diagnosis Date  . Anxiety   . Arthritis   . Blood transfusion    hx of autologus transfusion  . Cataract  cortical, senile   . Chicken pox   . Clotting disorder (HCC)   . Complete heart block (HCC)   . Depression    unspecified  . DVT (deep venous thrombosis) (HCC) 09/2014   left leg  . GERD (gastroesophageal reflux disease)   . Hereditary and idiopathic neuropathy   . History of depression   . Hypertension   . MCI (mild cognitive impairment) 10/22/2013  . Memory deficit 03/24/2011  . Memory loss   . Movement disorder   . Neuromuscular disorder (HCC)    peripheral  neuropathy  . Neuropathy   . Osteoarthritis   . Other hammer toe(s) (acquired), unspecified foot   . Pacemaker   . Parkinson's disease (HCC)   . Parkinsonism (HCC) 10/22/2013  . Shortness of breath   . Spinal stenosis   . Synovitis and tenosynovitis, unspecified   . Third degree heart block (HCC)   . Tremor    Past Surgical History:  Procedure Laterality Date  . CATARACT EXTRACTION  06/2005  . COLONOSCOPY  06/13/2006   PH colon polyps (MUS) repeat 7/20  . CORRECTION HAMMER TOE    . defibrilator  04/04/2012   Dual Chamber  . INSERT / REPLACE / REMOVE PACEMAKER  04/04/2012  . JOINT REPLACEMENT Right    THA  . JOINT REPLACEMENT Bilateral    TKA  . KNEE ARTHROSCOPY Bilateral    Right x 2 and Left  . KNEE SURGERY  1974,1979,1996  . LEAD REVISION   N/A 04/05/2012   Procedure: LEAD REVISION;  Surgeon: Steven C Klein, MD;  Location: [facility] CATH LAB;  Service: Cardiovascular;  Laterality: N/A;  . PERMANENT PACEMAKER INSERTION N/A 04/04/2012   Procedure: PERMANENT PACEMAKER INSERTION;  Surgeon: Gregg W Taylor, MD;  Location: [facility] CATH LAB;  Service: Cardiovascular;  Laterality: N/A;  . REPLACEMENT TOTAL HIP W/  RESURFACING IMPLANTS    . REPLACEMENT TOTAL KNEE BILATERAL  1997  . ROTATOR CUFF REPAIR  06/2008  . ROTATOR CUFF REPAIR Left 2010  . SHOULDER ARTHROSCOPY Left 06/19/2008   WITH SUBACROMIAL DECOMPRESSION. DISTAL CLAVICLE EXCISION. ARTHROSCOPIC ROTATOR CUFF REPAIR.  . TOTAL HIP ARTHROPLASTY  2009   right  . UMBILICAL  HERNIA REPAIR  2001  . VASECTOMY      Allergies  Allergen Reactions  . Citalopram     Other reaction(s): Unknown  . Codeine Nausea And Vomiting  . Sertraline     Other reaction(s): Unknown Other reaction(s): Unknown  . Lodine [Etodolac] Rash  . Percocet [Oxycodone-Acetaminophen] Rash    Tolerates tylenol Patient is able to take this recently    Allergies as of 10/26/2017      Reactions   Citalopram    Other reaction(s): Unknown   Codeine Nausea And Vomiting   Sertraline    Other reaction(s): Unknown Other reaction(s): Unknown   Lodine [etodolac] Rash   Percocet [oxycodone-acetaminophen] Rash   Tolerates tylenol Patient is able to take this recently      Medication List        Accurate as of 10/26/17 10:53 AM. Always use your most recent med list.          acetaminophen 325 MG tablet Commonly known as:  TYLENOL Take 650 mg by mouth 4 (four) times daily as needed. Max of 3000 mg of Apap in 24 hrs   BISCOLAX 10 MG suppository Generic drug:  bisacodyl Place 10 mg rectally 2 (two) times daily as needed for moderate constipation. Give until BM.   carbidopa-levodopa 25-250 MG tablet Commonly known as:  SINEMET IR Take 1 tablet by mouth 3 (three) times daily.   cetirizine 10 MG tablet Commonly known as:  ZYRTEC Take 10 mg by mouth at bedtime.   ciprofloxacin 250 MG tablet Commonly known as:  CIPRO Take 250 mg 2 (two) times daily by mouth.   cyanocobalamin 500 MCG tablet Take 500 mcg by mouth daily.   desvenlafaxine 50 MG 24 hr tablet Commonly known as:  PRISTIQ Take 50 mg by mouth every other day. At supper   FLONASE 50 MCG/ACT nasal spray Generic drug:  fluticasone Place 1 spray into both nostrils daily.   gabapentin 100 MG capsule Commonly known as:  NEURONTIN Take 1 capsule (100 mg) by mouth in the morning and 2 capsules (200 mg) by mouth at night.   guaiFENesin 100 MG/5ML liquid Commonly known as:  ROBITUSSIN Take 200 mg by mouth every 4 (four)  hours as needed for cough. Notify provider if cough worsens/ continues longer than 3 days.   HYDROcodone-acetaminophen 5-325 MG tablet Commonly known as:  NORCO/VICODIN Take 1 tablet every 4 (four) hours by mouth. Hold for sedation. May give prn doses in addition to scheduled for severe pain. Maximum of 3000 mg of Apap in 24 hours. ( use 2 half tablets = 1 whole tab)   HYDROcodone-acetaminophen 5-325 MG tablet Commonly known as:  NORCO/VICODIN Take 0.5-1 tablets every 4 (four) hours as needed by mouth for moderate pain or severe pain. Give 0.5 tablet for moderate   pain and give 1 tablet for severe pain. Maximum dose of 3000 mg of APAP in 24 hours   ipratropium-albuterol 0.5-2.5 (3) MG/3ML Soln Commonly known as:  DUONEB Take 3 mLs by nebulization every 6 (six) hours as needed (wheezing and shortness of breath with cough).   lisinopril 40 MG tablet Commonly known as:  PRINIVIL,ZESTRIL TAKE ONE TABLET BY MOUTH EVERY DAY   magnesium hydroxide 400 MG/5ML suspension Commonly known as:  MILK OF MAGNESIA Take 30 mLs by mouth every 4 (four) hours as needed. If constipation/ no BM for 2 days   OXYGEN Inhale 2 L into the lungs as needed. Check O2 sat prior to placing on patient and at least every 4 hours after applying. Notify MD if O2 sat drop below baseline while receiving oxygen or no improvement in dyspnea   OXYGEN Inhale 2 L into the lungs at bedtime. For sleep apnea like behavior   pantoprazole 40 MG tablet Commonly known as:  PROTONIX Take 40 mg by mouth daily.   Rivaroxaban 15 MG Tabs tablet Commonly known as:  XARELTO Take 15 mg daily with supper by mouth. Monitor for bleeding/bruising   sennosides-docusate sodium 8.6-50 MG tablet Commonly known as:  SENOKOT-S Take 1 tablet by mouth 2 (two) times daily as needed for constipation.   sodium phosphate enema Place 1 enema rectally every three (3) days as needed. If constipation not relieved by milk of magnesia or bisacodyl  suppository. Follow package directions   sorbitol 70 % solution Take 30 mLs by mouth every 2 (two) hours as needed. Until pt has large BM   tamsulosin 0.4 MG Caps capsule Commonly known as:  FLOMAX Take 0.4 mg by mouth daily after supper. 30 mins after supper   Zinc Oxide 10 % Oint Apply liberal amount topically to areas of skin irritation as needed. Ok to leave at bedside.       Review of Systems  Constitutional: Positive for appetite change. Negative for activity change, chills, diaphoresis, fatigue and fever.  HENT: Negative.   Eyes: Negative.   Respiratory: Negative for cough, choking, chest tightness, shortness of breath and wheezing.   Cardiovascular: Negative for chest pain, palpitations and leg swelling.  Gastrointestinal: Negative for abdominal distention, abdominal pain, constipation, diarrhea and nausea.  Genitourinary: Negative for difficulty urinating, flank pain and frequency.  Musculoskeletal: Positive for arthralgias, back pain, gait problem, joint swelling, neck pain and neck stiffness.  Skin: Negative.   Psychiatric/Behavioral: Positive for confusion.  All other systems reviewed and are negative.   Immunization History  Administered Date(s) Administered  . Influenza-Unspecified 10/20/2014, 09/10/2016, 09/02/2017  . PPD Test 12/05/2015, 11/26/2016  . Pneumococcal Polysaccharide-23 03/13/2014   Pertinent  Health Maintenance Due  Topic Date Due  . PNA vac Low Risk Adult (2 of 2 - PCV13) 03/14/2015  . INFLUENZA VACCINE  Completed   No flowsheet data found. Functional Status Survey:    Vitals:   10/26/17 1018  BP: 121/67  Pulse: 83  Resp: 18  Temp: 97.8 F (36.6 C)  TempSrc: Oral  SpO2: 97%  Weight: 205 lb 3.2 oz (93.1 kg)  Height: 5' 11" (1.803 m)   Body mass index is 28.62 kg/m. Physical Exam  Constitutional: He is oriented to person, place, and time. Vital signs are normal. He appears well-developed and well-nourished. He is active and  cooperative. He does not appear ill. No distress.  HENT:  Head: Normocephalic and atraumatic.  Mouth/Throat: Uvula is midline, oropharynx is clear and moist and mucous  membranes are normal. Mucous membranes are not pale, not dry and not cyanotic.  Eyes: Conjunctivae, EOM and lids are normal. Pupils are equal, round, and reactive to light.  Neck: Trachea normal, normal range of motion and full passive range of motion without pain. Neck supple. No JVD present. No tracheal deviation, no edema and no erythema present. No thyromegaly present.  Cardiovascular: Normal rate, regular rhythm, normal heart sounds, intact distal pulses and normal pulses. Exam reveals no gallop, no distant heart sounds and no friction rub.  No murmur heard. Pulses:      Dorsalis pedis pulses are 2+ on the right side, and 2+ on the left side.  No edema  Pulmonary/Chest: Effort normal and breath sounds normal. No accessory muscle usage. No respiratory distress. He has no decreased breath sounds. He has no wheezes. He has no rhonchi. He has no rales. He exhibits no tenderness.  Abdominal: Soft. Normal appearance and bowel sounds are normal. He exhibits no distension and no ascites. There is no tenderness.  Musculoskeletal: He exhibits no edema.  Expected osteoarthritis, stiffness, weakness  Neurological: He is alert and oriented to person, place, and time. He has normal strength. Coordination and gait abnormal.  Skin: Skin is warm, dry and intact. No rash noted. He is not diaphoretic. No cyanosis or erythema. No pallor. Nails show no clubbing.  Psychiatric: He has a normal mood and affect. His speech is normal and behavior is normal. Judgment and thought content normal. Cognition and memory are impaired. He exhibits abnormal recent memory.  confused  Nursing note and vitals reviewed.   Labs reviewed: Recent Labs    03/09/17 1408 09/11/17 1506  NA 138 138  K 4.1 4.8  CL 107 103  CO2 25 30  GLUCOSE 136* 110*  BUN 15  22*  CREATININE 0.94 0.98  CALCIUM 8.9 9.2   Recent Labs    03/09/17 1408 09/11/17 1506  AST 17 15  ALT 13* 6*  ALKPHOS 79 63  BILITOT 0.7 0.6  PROT 6.4* 6.7  ALBUMIN 4.0 4.2   Recent Labs    03/09/17 1408 09/11/17 1506  WBC 6.3 6.3  NEUTROABS 3.5 3.8  HGB 13.1 13.1  HCT 38.7* 39.3*  MCV 97.9 98.9  PLT 152 150   Lab Results  Component Value Date   TSH 0.892 05/18/2016   No results found for: HGBA1C No results found for: CHOL, HDL, LDLCALC, LDLDIRECT, TRIG, CHOLHDL  Significant Diagnostic Results in last 30 days:  No results found.  Assessment/Plan 1.  Altered mental status, unspecified altered mental status type 2.  Acute cystitis with hematuria  Rocephin 1 g IV x1 now  Change Cipro p.o. to Cipro 400 mg / 200 mL's IV every 12 hours times 3 days  Resume p.o. Cipro 250 mg po q 12 hrs when IV course is complete  0.9% normal saline-give 250 mL bolus over 1 hour, then continuous at 75 mL/hour times 48 hours for hydration  Family/ staff Communication:   Total Time:  Documentation:  Face to Face:  Family/Phone:   Labs/tests ordered:   CBC, met B  Medication list reviewed and assessed for continued appropriateness.  Vikki Ports, NP-C Geriatrics [facility] [phone], [place] [address] Cell Phone (Mon-Fri 8am-5pm):  [phone] On Call:  [phone] & follow prompts after 5pm & weekends Office Phone:  [phone] Office Fax:  [phone]

## 2017-10-27 ENCOUNTER — Other Ambulatory Visit
Admission: RE | Admit: 2017-10-27 | Discharge: 2017-10-27 | Disposition: A | Discharge status: Home / Self Care | Payer: Medicare Other | Source: Ambulatory Visit | Attending: Gerontology | Admitting: Gerontology

## 2017-10-27 DIAGNOSIS — R7989 Other specified abnormal findings of blood chemistry: Secondary | ICD-10-CM | POA: Diagnosis not present

## 2017-10-27 DIAGNOSIS — I82402 Acute embolism and thrombosis of unspecified deep veins of left lower extremity: Secondary | ICD-10-CM | POA: Insufficient documentation

## 2017-10-27 LAB — CBC WITH DIFFERENTIAL/PLATELET
BASOS ABS: 0 10*3/uL (ref 0–0.1)
BASOS PCT: 0 %
EOS ABS: 0.2 10*3/uL (ref 0–0.7)
EOS PCT: 1 %
HCT: 36.5 % — ABNORMAL LOW (ref 40.0–52.0)
HEMOGLOBIN: 11.8 g/dL — AB (ref 13.0–18.0)
LYMPHS ABS: 1.1 10*3/uL (ref 1.0–3.6)
LYMPHS PCT: 8 %
MCH: 32.2 pg (ref 26.0–34.0)
MCHC: 32.2 g/dL (ref 32.0–36.0)
MCV: 99.9 fL (ref 80.0–100.0)
Monocytes Absolute: 0.7 10*3/uL (ref 0.2–1.0)
Monocytes Relative: 5 %
NEUTROS ABS: 11.5 10*3/uL — AB (ref 1.4–6.5)
Neutrophils Relative %: 86 %
PLATELETS: 160 10*3/uL (ref 150–440)
RBC: 3.65 MIL/uL — AB (ref 4.40–5.90)
RDW: 14.7 % — ABNORMAL HIGH (ref 11.5–14.5)
WBC: 13.5 10*3/uL — AB (ref 3.8–10.6)

## 2017-10-27 LAB — CUP PACEART REMOTE DEVICE CHECK
Battery Impedance: 1045 Ohm
Battery Remaining Longevity: 52 mo
Battery Voltage: 2.78 V
Brady Statistic AP VS Percent: 0 %
Brady Statistic AS VP Percent: 70 %
Implantable Lead Implant Date: 20130424
Implantable Lead Location: 753859
Implantable Lead Location: 753860
Implantable Lead Model: 5076
Implantable Lead Model: 5076
Implantable Pulse Generator Implant Date: 20130424
Lead Channel Pacing Threshold Amplitude: 0.375 V
Lead Channel Pacing Threshold Pulse Width: 0.4 ms
Lead Channel Pacing Threshold Pulse Width: 0.4 ms
Lead Channel Setting Pacing Amplitude: 1.5 V
Lead Channel Setting Pacing Amplitude: 2.5 V
Lead Channel Setting Pacing Pulse Width: 0.4 ms
MDC IDC LEAD IMPLANT DT: 20130424
MDC IDC MSMT LEADCHNL RA IMPEDANCE VALUE: 520 Ohm
MDC IDC MSMT LEADCHNL RV IMPEDANCE VALUE: 675 Ohm
MDC IDC MSMT LEADCHNL RV PACING THRESHOLD AMPLITUDE: 0.5 V
MDC IDC SESS DTM: 20181115190307
MDC IDC SET LEADCHNL RV SENSING SENSITIVITY: 4 mV
MDC IDC STAT BRADY AP VP PERCENT: 30 %
MDC IDC STAT BRADY AS VS PERCENT: 0 %

## 2017-10-27 LAB — BASIC METABOLIC PANEL
ANION GAP: 12 (ref 5–15)
BUN: 47 mg/dL — AB (ref 6–20)
CHLORIDE: 98 mmol/L — AB (ref 101–111)
CO2: 22 mmol/L (ref 22–32)
Calcium: 8.5 mg/dL — ABNORMAL LOW (ref 8.9–10.3)
Creatinine, Ser: 2.69 mg/dL — ABNORMAL HIGH (ref 0.61–1.24)
GFR, EST AFRICAN AMERICAN: 24 mL/min — AB (ref >60)
GFR, EST NON AFRICAN AMERICAN: 21 mL/min — AB (ref >60)
Glucose, Bld: 113 mg/dL — ABNORMAL HIGH (ref 65–99)
POTASSIUM: 3.8 mmol/L (ref 3.5–5.1)
SODIUM: 132 mmol/L — AB (ref 135–145)

## 2017-10-27 NOTE — Progress Notes (Signed)
Remote pacemaker transmission.   

## 2017-11-01 ENCOUNTER — Other Ambulatory Visit
Admission: RE | Admit: 2017-11-01 | Discharge: 2017-11-01 | Disposition: A | Discharge status: Home / Self Care | Payer: Medicare Other | Source: Ambulatory Visit | Attending: Gerontology | Admitting: Gerontology

## 2017-11-01 ENCOUNTER — Encounter: Payer: Self-pay | Admitting: Cardiology

## 2017-11-01 DIAGNOSIS — N390 Urinary tract infection, site not specified: Secondary | ICD-10-CM | POA: Insufficient documentation

## 2017-11-01 LAB — COMPREHENSIVE METABOLIC PANEL
ALBUMIN: 3.2 g/dL — AB (ref 3.5–5.0)
ALK PHOS: 63 U/L (ref 38–126)
ALT: 6 U/L — ABNORMAL LOW (ref 17–63)
ANION GAP: 7 (ref 5–15)
AST: 19 U/L (ref 15–41)
BILIRUBIN TOTAL: 0.7 mg/dL (ref 0.3–1.2)
BUN: 10 mg/dL (ref 6–20)
CO2: 25 mmol/L (ref 22–32)
Calcium: 8.4 mg/dL — ABNORMAL LOW (ref 8.9–10.3)
Chloride: 106 mmol/L (ref 101–111)
Creatinine, Ser: 0.95 mg/dL (ref 0.61–1.24)
GFR calc Af Amer: >60 mL/min (ref >60)
GFR calc non Af Amer: >60 mL/min (ref >60)
GLUCOSE: 125 mg/dL — AB (ref 65–99)
Potassium: 3.6 mmol/L (ref 3.5–5.1)
Sodium: 138 mmol/L (ref 135–145)
Total Protein: 6.1 g/dL — ABNORMAL LOW (ref 6.5–8.1)

## 2017-11-01 LAB — CBC WITH DIFFERENTIAL/PLATELET
BASOS PCT: 1 %
Basophils Absolute: 0.1 10*3/uL (ref 0–0.1)
Eosinophils Absolute: 0.3 10*3/uL (ref 0–0.7)
Eosinophils Relative: 6 %
HEMATOCRIT: 37.6 % — AB (ref 40.0–52.0)
HEMOGLOBIN: 12.6 g/dL — AB (ref 13.0–18.0)
LYMPHS ABS: 1.5 10*3/uL (ref 1.0–3.6)
LYMPHS PCT: 25 %
MCH: 33.2 pg (ref 26.0–34.0)
MCHC: 33.4 g/dL (ref 32.0–36.0)
MCV: 99.4 fL (ref 80.0–100.0)
MONOS PCT: 6 %
Monocytes Absolute: 0.4 10*3/uL (ref 0.2–1.0)
NEUTROS ABS: 3.9 10*3/uL (ref 1.4–6.5)
NEUTROS PCT: 62 %
Platelets: 193 10*3/uL (ref 150–440)
RBC: 3.79 MIL/uL — ABNORMAL LOW (ref 4.40–5.90)
RDW: 14.3 % (ref 11.5–14.5)
WBC: 6.2 10*3/uL (ref 3.8–10.6)

## 2017-11-11 ENCOUNTER — Encounter
Admission: RE | Admit: 2017-11-11 | Discharge: 2017-11-11 | Disposition: A | Discharge status: Home / Self Care | Payer: Medicare Other | Source: Ambulatory Visit | Attending: Internal Medicine | Admitting: Internal Medicine

## 2017-11-14 ENCOUNTER — Encounter: Payer: Self-pay | Admitting: Gerontology

## 2017-11-14 ENCOUNTER — Non-Acute Institutional Stay (SKILLED_NURSING_FACILITY): Payer: Medicare Other | Admitting: Gerontology

## 2017-11-14 DIAGNOSIS — I10 Essential (primary) hypertension: Secondary | ICD-10-CM | POA: Diagnosis not present

## 2017-11-14 DIAGNOSIS — I825Y2 Chronic embolism and thrombosis of unspecified deep veins of left proximal lower extremity: Secondary | ICD-10-CM | POA: Diagnosis not present

## 2017-11-14 DIAGNOSIS — I442 Atrioventricular block, complete: Secondary | ICD-10-CM | POA: Diagnosis not present

## 2017-12-07 ENCOUNTER — Other Ambulatory Visit: Payer: Self-pay

## 2017-12-07 MED ORDER — HYDROCODONE-ACETAMINOPHEN 5-325 MG PO TABS: 1.0000 | ORAL_TABLET | ORAL | 0 refills | Status: DC | PRN

## 2017-12-07 MED ORDER — HYDROCODONE-ACETAMINOPHEN 5-325 MG PO TABS: 1.0000 | ORAL_TABLET | ORAL | 0 refills | Status: DC

## 2017-12-07 NOTE — Telephone Encounter (Signed)
Rx sent to [facility] phone : [phone] , fax : [phone]  

## 2017-12-12 ENCOUNTER — Encounter
Admission: RE | Admit: 2017-12-12 | Discharge: 2017-12-12 | Disposition: A | Discharge status: Home / Self Care | Payer: Medicare Other | Source: Ambulatory Visit | Attending: Internal Medicine | Admitting: Internal Medicine

## 2017-12-21 ENCOUNTER — Encounter: Payer: Self-pay | Admitting: Gerontology

## 2017-12-21 ENCOUNTER — Non-Acute Institutional Stay (SKILLED_NURSING_FACILITY): Payer: Medicare Other | Admitting: Gerontology

## 2017-12-21 DIAGNOSIS — K219 Gastro-esophageal reflux disease without esophagitis: Secondary | ICD-10-CM

## 2017-12-21 DIAGNOSIS — J309 Allergic rhinitis, unspecified: Secondary | ICD-10-CM | POA: Diagnosis not present

## 2017-12-21 DIAGNOSIS — K5909 Other constipation: Secondary | ICD-10-CM

## 2017-12-21 NOTE — Progress Notes (Signed)
Location:   [facility] Room Number: [phone] Place of Service:  SNF [phone]) Provider:  Lorenso Quarry, NP-C  [name], MD  Patient Care Team: [name], MD as PCP - General (Family Medicine)  Extended Emergency Contact Information Primary Emergency Contact: Marva Panda [place] Home Phone: [phone] Mobile Phone: [phone] Relation: Spouse Secondary Emergency Contact: Gwynneth Munson [place] Mobile Phone: [phone] Relation: Son  Code Status:  DNR Goals of care: Advanced Directive information Advanced Directives 12/21/2017  Does Patient Have a Medical Advance Directive? Yes  Type of Advance Directive Out of facility DNR (pink MOST or yellow form)  Does patient want to make changes to medical advance directive? No - Patient declined  Copy of Healthcare Power of Attorney in Chart? -  Would patient like information on creating a medical advance directive? -  Pre-existing out of facility DNR order (yellow form or pink MOST form) -     Chief Complaint  Patient presents with  . Medical Management of Chronic Issues    Routine Visit    HPI:  Pt is a 84 y.o. male seen today for medical management of chronic diseases.    Allergic rhinitis, unspecified seasonality, unspecified trigger Stable. Symptoms controlled with FLonase Q day and Zyrtec at bedtime. Occasional cough from PND  Constipation, chronic Controlled. Symptoms controlled with senna s 2 tablets BID. Occasional prn use of Sorbitol 70% sln Q 2 hrs until BM, MOM, or Fleets. No diarrhea noted. Occasional gas.  Gastroesophageal reflux disease without esophagitis Stable. Symptoms controlled on Protonix daily. No other interventions needed recently.  Patient being followed by Palliative Care for symptom management, family discussions.   Past Medical History:  Diagnosis Date  . Anxiety   . Arthritis   . Blood transfusion    hx of autologus  transfusion  . Cataract cortical, senile   . Chicken pox   . Clotting disorder (HCC)   . Complete heart block (HCC)   . Depression    unspecified  . DVT (deep venous thrombosis) (HCC) 09/2014   left leg  . GERD (gastroesophageal reflux disease)   . Hereditary and idiopathic neuropathy   . History of depression   . Hypertension   . MCI (mild cognitive impairment) 10/22/2013  . Memory deficit 03/24/2011  . Memory loss   . Movement disorder   . Neuromuscular disorder (HCC)    peripheral  neuropathy  . Neuropathy   . Osteoarthritis   . Other hammer toe(s) (acquired), unspecified foot   . Pacemaker   . Parkinson's disease (HCC)   . Parkinsonism (HCC) 10/22/2013  . Shortness of breath   . Spinal stenosis   . Synovitis and tenosynovitis, unspecified   . Third degree heart block (HCC)   . Tremor    Past Surgical History:  Procedure Laterality Date  . CATARACT EXTRACTION  06/2005  . COLONOSCOPY  06/13/2006   PH colon polyps (MUS) repeat 7/20  . CORRECTION HAMMER TOE    . defibrilator  04/04/2012   Dual Chamber  . INSERT / REPLACE / REMOVE PACEMAKER  04/04/2012  . JOINT REPLACEMENT Right    THA  . JOINT REPLACEMENT Bilateral    TKA  . KNEE ARTHROSCOPY Bilateral    Right x 2 and Left  . KNEE SURGERY  [phone]  . LEAD REVISION N/A 04/05/2012   Procedure: LEAD REVISION;  Surgeon: Duke Salvia, MD;  Location: [facility] CATH LAB;  Service: Cardiovascular;  Laterality: N/A;  .  PERMANENT PACEMAKER INSERTION N/A 04/04/2012   Procedure: PERMANENT PACEMAKER INSERTION;  Surgeon: Marinus Maw, MD;  Location: [facility] CATH LAB;  Service: Cardiovascular;  Laterality: N/A;  . REPLACEMENT TOTAL HIP W/  RESURFACING IMPLANTS    . REPLACEMENT TOTAL KNEE BILATERAL  1997  . ROTATOR CUFF REPAIR  06/2008  . ROTATOR CUFF REPAIR Left 2010  . SHOULDER ARTHROSCOPY Left 06/19/2008   WITH SUBACROMIAL DECOMPRESSION. DISTAL CLAVICLE EXCISION. ARTHROSCOPIC ROTATOR CUFF REPAIR.  Marland Kitchen TOTAL HIP ARTHROPLASTY  2009    right  . UMBILICAL HERNIA REPAIR  2001  . VASECTOMY      Allergies  Allergen Reactions  . Citalopram     Other reaction(s): Unknown  . Codeine Nausea And Vomiting  . Sertraline     Other reaction(s): Unknown Other reaction(s): Unknown  . Lodine [Etodolac] Rash  . Percocet [Oxycodone-Acetaminophen] Rash    Tolerates tylenol Patient is able to take this recently    Allergies as of 12/21/2017      Reactions   Citalopram    Other reaction(s): Unknown   Codeine Nausea And Vomiting   Sertraline    Other reaction(s): Unknown Other reaction(s): Unknown   Lodine [etodolac] Rash   Percocet [oxycodone-acetaminophen] Rash   Tolerates tylenol Patient is able to take this recently      Medication List        Accurate as of 12/21/17 12:19 PM. Always use your most recent med list.          acetaminophen 325 MG tablet Commonly known as:  TYLENOL Take 650 mg by mouth 4 (four) times daily as needed. Max of 3000 mg of Apap in 24 hrs   BISCOLAX 10 MG suppository Generic drug:  bisacodyl Place 10 mg rectally 2 (two) times daily as needed for moderate constipation. Give until BM.   carbidopa-levodopa 25-250 MG tablet Commonly known as:  SINEMET IR Take 1 tablet by mouth 3 (three) times daily.   cetirizine 10 MG tablet Commonly known as:  ZYRTEC Take 10 mg by mouth at bedtime.   cyanocobalamin 500 MCG tablet Take 500 mcg by mouth daily.   desvenlafaxine 50 MG 24 hr tablet Commonly known as:  PRISTIQ Take 50 mg by mouth every other day. At supper   FLONASE 50 MCG/ACT nasal spray Generic drug:  fluticasone Place 1 spray into both nostrils daily.   gabapentin 100 MG capsule Commonly known as:  NEURONTIN Take 1 capsule (100 mg) by mouth in the morning and 2 capsules (200 mg) by mouth at night.   guaiFENesin 100 MG/5ML liquid Commonly known as:  ROBITUSSIN Take 200 mg by mouth every 4 (four) hours as needed for cough. Notify provider if cough worsens/ continues longer than  3 days.   HYDROcodone-acetaminophen 5-325 MG tablet Commonly known as:  NORCO/VICODIN Take 1 tablet by mouth every 6 (six) hours as needed for moderate pain. Hold for sedation. May give prn doses in addition to scheduled for severe pain. Max of 3000 mg of APAP in 24 hrs   HYDROcodone-acetaminophen 5-325 MG tablet Commonly known as:  NORCO/VICODIN Take 1 tablet by mouth every 4 (four) hours as needed for moderate pain or severe pain. Give 0.5 tablet for moderate pain and give 1 tablet for severe pain. Maximum dose of 3000 mg of APAP in 24 hours   ipratropium-albuterol 0.5-2.5 (3) MG/3ML Soln Commonly known as:  DUONEB Take 3 mLs by nebulization every 6 (six) hours as needed (wheezing and shortness of breath with cough).  lisinopril 40 MG tablet Commonly known as:  PRINIVIL,ZESTRIL TAKE ONE TABLET BY MOUTH EVERY DAY   magnesium hydroxide 400 MG/5ML suspension Commonly known as:  MILK OF MAGNESIA Take 30 mLs by mouth every 4 (four) hours as needed. If constipation/ no BM for 2 days   mupirocin cream 2 % Commonly known as:  BACTROBAN Apply 1 application topically daily. Cleanse left cheek with ns, apply bactroban to affected area   OXYGEN Inhale 2 L into the lungs as needed. Check O2 sat prior to placing on patient and at least every 4 hours after applying. Notify MD if O2 sat drop below baseline while receiving oxygen or no improvement in dyspnea   OXYGEN Inhale 2 L into the lungs at bedtime. For sleep apnea like behavior   pantoprazole 40 MG tablet Commonly known as:  PROTONIX Take 40 mg by mouth daily.   Rivaroxaban 15 MG Tabs tablet Commonly known as:  XARELTO Take 15 mg daily with supper by mouth. Monitor for bleeding/bruising   sennosides-docusate sodium 8.6-50 MG tablet Commonly known as:  SENOKOT-S Take 2 tablets by mouth 2 (two) times daily.   sodium phosphate enema Place 1 enema rectally every three (3) days as needed. If constipation not relieved by milk of  magnesia or bisacodyl suppository. Follow package directions   sorbitol 70 % solution Take 30 mLs by mouth every 2 (two) hours as needed. Until pt has large BM   tamsulosin 0.4 MG Caps capsule Commonly known as:  FLOMAX Take 0.4 mg by mouth daily after supper. 30 mins after supper   Zinc Oxide 10 % Oint Apply liberal amount topically to areas of skin irritation as needed. Ok to leave at bedside.       Review of Systems  Constitutional: Negative for activity change, appetite change, chills, diaphoresis and fever.  HENT: Negative for congestion, mouth sores, nosebleeds, postnasal drip, sneezing, sore throat, trouble swallowing and voice change.   Respiratory: Negative for apnea, cough, choking, chest tightness, shortness of breath and wheezing.   Cardiovascular: Negative for chest pain, palpitations and leg swelling.  Gastrointestinal: Negative for abdominal distention, abdominal pain, constipation, diarrhea and nausea.  Genitourinary: Negative for difficulty urinating, dysuria, frequency and urgency.  Musculoskeletal: Positive for arthralgias (typical arthritis), back pain, gait problem and myalgias.  Skin: Negative for color change, pallor, rash and wound.  Neurological: Negative for dizziness, tremors, syncope, speech difficulty, weakness, numbness and headaches.  Psychiatric/Behavioral: Negative for agitation and behavioral problems.  All other systems reviewed and are negative.   Immunization History  Administered Date(s) Administered  . Influenza-Unspecified 10/20/2014, 09/10/2016, 09/02/2017  . PPD Test 12/05/2015, 11/26/2016  . Pneumococcal Polysaccharide-23 03/13/2014   Pertinent  Health Maintenance Due  Topic Date Due  . PNA vac Low Risk Adult (2 of 2 - PCV13) 03/14/2015  . INFLUENZA VACCINE  Completed   No flowsheet data found. Functional Status Survey:    Vitals:   12/21/17 1206  BP: 121/69  Pulse: 71  Resp: 16  Temp: 98.2 F (36.8 C)  TempSrc: Oral    SpO2: 100%  Weight: 198 lb (89.8 kg)  Height: 5\' 10"  (1.778 m)   Body mass index is 28.41 kg/m. Physical Exam  Constitutional: He is oriented to person, place, and time. Vital signs are normal. He appears well-developed and well-nourished. He is active and cooperative. He does not appear ill. No distress.  HENT:  Head: Normocephalic and atraumatic.  Mouth/Throat: Uvula is midline, oropharynx is clear and moist and mucous membranes  are normal. Mucous membranes are not pale, not dry and not cyanotic.  Eyes: Conjunctivae, EOM and lids are normal. Pupils are equal, round, and reactive to light.  Neck: Trachea normal, normal range of motion and full passive range of motion without pain. Neck supple. No JVD present. No tracheal deviation, no edema and no erythema present. No thyromegaly present.  Cardiovascular: Normal rate, regular rhythm, normal heart sounds, intact distal pulses and normal pulses. Exam reveals no gallop, no distant heart sounds and no friction rub.  No murmur heard. Pulses:      Dorsalis pedis pulses are 2+ on the right side, and 2+ on the left side.  No edema  Pulmonary/Chest: Effort normal and breath sounds normal. No accessory muscle usage. No respiratory distress. He has no decreased breath sounds. He has no wheezes. He has no rhonchi. He has no rales. He exhibits no tenderness.  Abdominal: Soft. Normal appearance and bowel sounds are normal. He exhibits no distension and no ascites. There is no tenderness.  Musculoskeletal: Normal range of motion. He exhibits no edema or tenderness.  Expected osteoarthritis, stiffness; Bilateral Calves soft, supple. Negative Homan's Sign. B- pedal pulses equal; generalized weakness  Neurological: He is alert and oriented to person, place, and time. He has normal strength. He displays atrophy. He exhibits abnormal muscle tone. Coordination and gait abnormal.  Parkinsonss  Skin: Skin is warm, dry and intact. He is not diaphoretic. No  cyanosis. No pallor. Nails show no clubbing.  Psychiatric: His speech is normal and behavior is normal. Judgment and thought content normal. His affect is blunt. Cognition and memory are impaired. He exhibits abnormal recent memory.  Nursing note and vitals reviewed.   Labs reviewed: Recent Labs    09/11/17 1506 10/27/17 1110 11/01/17 1335  NA 138 132* 138  K 4.8 3.8 3.6  CL 103 98* 106  CO2 30 22 25   GLUCOSE 110* 113* 125*  BUN 22* 47* 10  CREATININE 0.98 2.69* 0.95  CALCIUM 9.2 8.5* 8.4*   Recent Labs    03/09/17 1408 09/11/17 1506 11/01/17 1335  AST 17 15 19   ALT 13* 6* 6*  ALKPHOS 79 63 63  BILITOT 0.7 0.6 0.7  PROT 6.4* 6.7 6.1*  ALBUMIN 4.0 4.2 3.2*   Recent Labs    09/11/17 1506 10/27/17 1110 11/01/17 1335  WBC 6.3 13.5* 6.2  NEUTROABS 3.8 11.5* 3.9  HGB 13.1 11.8* 12.6*  HCT 39.3* 36.5* 37.6*  MCV 98.9 99.9 99.4  PLT 150 160 193   Lab Results  Component Value Date   TSH 0.892 05/18/2016   No results found for: HGBA1C No results found for: CHOL, HDL, LDLCALC, LDLDIRECT, TRIG, CHOLHDL  Significant Diagnostic Results in last 30 days:  No results found.  Assessment/Plan  Allergic rhinitis, unspecified seasonality, unspecified trigger  Constipation, chronic  Gastroesophageal reflux disease without esophagitis   All above conditions stable  Continue current medication regimen  Continue Palliative Care services  Monitor closely for diarrhea or worsening constipation   Family/ staff Communication:   Total Time:  Documentation:  Face to Face:  Family/Phone:   Labs/tests ordered:  Not due  Medication list reviewed and assessed for continued appropriateness. Monthly medication orders reviewed and signed.  Brynda Rim, NP-C Geriatrics [facility] [phone] [address], [place] [address] Cell Phone (Mon-Fri 8am-5pm):  [phone] On Call:  [phone] & follow prompts after 5pm &  weekends Office Phone:  [phone] Office Fax:  [phone]

## 2017-12-29 NOTE — Assessment & Plan Note (Signed)
Stable. Symptoms controlled with FLonase Q day and Zyrtec at bedtime. Occasional cough from PND

## 2017-12-29 NOTE — Assessment & Plan Note (Addendum)
Stable. Symptoms controlled on Protonix daily. No other interventions needed recently.

## 2017-12-29 NOTE — Assessment & Plan Note (Signed)
Stable. Tolerating Xarelto- reduced dose for renal fxn. No s/s of bleeding.

## 2017-12-29 NOTE — Assessment & Plan Note (Signed)
BPs stable. No recent episodes of hypotension. Symptoms managed on Lisinopril. Recent Met B stable

## 2017-12-29 NOTE — Assessment & Plan Note (Signed)
Controlled. Symptoms controlled with senna s 2 tablets BID. Occasional prn use of Sorbitol 70% sln Q 2 hrs until BM, MOM, or Fleets. No diarrhea noted. Occasional gas.

## 2017-12-29 NOTE — Progress Notes (Signed)
Location:    Nursing Home Room Number: 765Y Place of Service:  SNF (31) Provider:  Toni Arthurs, NP-C  [place] Pink, MD  Patient Care Team: [place] Pink, MD as PCP - General (Family Medicine)  Extended Emergency Contact Information Primary Emergency Contact: Hermelinda Medicus [place] of [employer] Phone: [phone] Mobile Phone: [phone] Relation: Spouse Secondary Emergency Contact: Elvina Sidle [place] of [place] Mobile Phone: [phone] Relation: Son  Code Status:  DNR Goals of care: Advanced Directive information Advanced Directives 12/21/2017  Does Patient Have a Medical Advance Directive? Yes  Type of Advance Directive Out of facility DNR (pink MOST or yellow form)  Does patient want to make changes to medical advance directive? No - Patient declined  Copy of [place] in Chart? -  Would patient like information on creating a medical advance directive? -  Pre-existing out of facility DNR order (yellow form or pink MOST form) -     Chief Complaint  Patient presents with  . Medical Management of Chronic Issues    Routine Visit    HPI:  Pt is a 84 y.o. male seen today for medical management of chronic diseases.    HTN (hypertension) BPs stable. No recent episodes of hypotension. Symptoms managed on Lisinopril. Recent Met B stable  Complete heart block Pacemaker. Stable. No c/o chest pain/dyspnea  Chronic deep vein thrombosis (DVT) of proximal vein of lower extremity (HCC) Stable. Tolerating Xarelto- reduced dose for renal fxn. No s/s of bleeding.    Past Medical History:  Diagnosis Date  . Anxiety   . Arthritis   . Blood transfusion    hx of autologus transfusion  . Cataract cortical, senile   . Chicken pox   . Clotting disorder ([place])   . Complete heart block ([place])   . Depression    unspecified  . DVT (deep venous thrombosis) ( [place]) 09/2014   left leg  . GERD (gastroesophageal reflux disease)   .  Hereditary and idiopathic neuropathy   . History of depression   . Hypertension   . MCI (mild cognitive impairment) 10/22/2013  . Memory deficit 03/24/2011  . Memory loss   . Movement disorder   . Neuromuscular disorder ([place])    peripheral  neuropathy  . Neuropathy   . Osteoarthritis   . Other hammer toe(s) (acquired), unspecified foot   . Pacemaker   . Parkinson's disease ([place])   . Parkinsonism ([place]) 10/22/2013  . Shortness of breath   . Spinal stenosis   . Synovitis and tenosynovitis, unspecified   . Third degree heart block ([place])   . Tremor    Past Surgical History:  Procedure Laterality Date  . CATARACT EXTRACTION  06/2005  . COLONOSCOPY  06/13/2006   PH colon polyps (MUS) repeat 7/20  . CORRECTION HAMMER TOE    . defibrilator  04/04/2012   Dual Chamber  . INSERT / REPLACE / REMOVE PACEMAKER  04/04/2012  . JOINT REPLACEMENT Right    THA  . JOINT REPLACEMENT Bilateral    TKA  . KNEE ARTHROSCOPY Bilateral    Right x 2 and Left  . KNEE SURGERY  [phone]  . LEAD REVISION N/A 04/05/2012   Procedure: LEAD REVISION;  Surgeon: Deboraha Sprang, MD;  Location: [facility] CATH LAB;  Service: Cardiovascular;  Laterality: N/A;  . PERMANENT PACEMAKER INSERTION N/A 04/04/2012   Procedure: PERMANENT PACEMAKER INSERTION;  Surgeon: Evans Lance, MD;  Location: [facility] CATH LAB;  Service: Cardiovascular;  Laterality: N/A;  . REPLACEMENT  TOTAL HIP W/  RESURFACING IMPLANTS    . REPLACEMENT TOTAL KNEE BILATERAL  1997  . ROTATOR CUFF REPAIR  06/2008  . ROTATOR CUFF REPAIR Left 2010  . SHOULDER ARTHROSCOPY Left 06/19/2008   WITH SUBACROMIAL DECOMPRESSION. DISTAL CLAVICLE EXCISION. ARTHROSCOPIC ROTATOR CUFF REPAIR.  Marland Kitchen TOTAL HIP ARTHROPLASTY  2009   right  . UMBILICAL HERNIA REPAIR  2001  . VASECTOMY      Allergies  Allergen Reactions  . Citalopram     Other reaction(s): Unknown  . Codeine Nausea And Vomiting  . Sertraline     Other reaction(s): Unknown Other reaction(s): Unknown  . Lodine  [Etodolac] Rash  . Percocet [Oxycodone-Acetaminophen] Rash    Tolerates tylenol Patient is able to take this recently    Allergies as of 11/14/2017      Reactions   Citalopram    Other reaction(s): Unknown   Codeine Nausea And Vomiting   Sertraline    Other reaction(s): Unknown Other reaction(s): Unknown   Lodine [etodolac] Rash   Percocet [oxycodone-acetaminophen] Rash   Tolerates tylenol Patient is able to take this recently      Medication List        Accurate as of 11/14/17 11:59 PM. Always use your most recent med list.          acetaminophen 325 MG tablet Commonly known as:  TYLENOL Take 650 mg by mouth 4 (four) times daily as needed. Max of 3000 mg of Apap in 24 hrs   BISCOLAX 10 MG suppository Generic drug:  bisacodyl Place 10 mg rectally 2 (two) times daily as needed for moderate constipation. Give until BM.   carbidopa-levodopa 25-250 MG tablet Commonly known as:  SINEMET IR Take 1 tablet by mouth 3 (three) times daily.   cetirizine 10 MG tablet Commonly known as:  ZYRTEC Take 10 mg by mouth at bedtime.   cyanocobalamin 500 MCG tablet Take 500 mcg by mouth daily.   desvenlafaxine 50 MG 24 hr tablet Commonly known as:  PRISTIQ Take 50 mg by mouth every other day. At supper   FLONASE 50 MCG/ACT nasal spray Generic drug:  fluticasone Place 1 spray into both nostrils daily.   gabapentin 100 MG capsule Commonly known as:  NEURONTIN Take 1 capsule (100 mg) by mouth in the morning and 2 capsules (200 mg) by mouth at night.   guaiFENesin 100 MG/5ML liquid Commonly known as:  ROBITUSSIN Take 200 mg by mouth every 4 (four) hours as needed for cough. Notify provider if cough worsens/ continues longer than 3 days.   HYDROcodone-acetaminophen 5-325 MG tablet Commonly known as:  NORCO/VICODIN Take 1 tablet every 4 (four) hours by mouth. Hold for sedation. May give prn doses in addition to scheduled for severe pain. Maximum of 3000 mg of Apap in 24 hours. (  use 2 half tablets = 1 whole tab)   HYDROcodone-acetaminophen 5-325 MG tablet Commonly known as:  NORCO/VICODIN Take 1 tablet by mouth every 4 (four) hours as needed for moderate pain or severe pain. Give 0.5 tablet for moderate pain and give 1 tablet for severe pain. Maximum dose of 3000 mg of APAP in 24 hours   ipratropium-albuterol 0.5-2.5 (3) MG/3ML Soln Commonly known as:  DUONEB Take 3 mLs by nebulization every 6 (six) hours as needed (wheezing and shortness of breath with cough).   lisinopril 40 MG tablet Commonly known as:  PRINIVIL,ZESTRIL TAKE ONE TABLET BY MOUTH EVERY DAY   magnesium hydroxide 400 MG/5ML suspension Commonly known as:  MILK OF MAGNESIA Take 30 mLs by mouth every 4 (four) hours as needed. If constipation/ no BM for 2 days   OXYGEN Inhale 2 L into the lungs as needed. Check O2 sat prior to placing on patient and at least every 4 hours after applying. Notify MD if O2 sat drop below baseline while receiving oxygen or no improvement in dyspnea   OXYGEN Inhale 2 L into the lungs at bedtime. For sleep apnea like behavior   pantoprazole 40 MG tablet Commonly known as:  PROTONIX Take 40 mg by mouth daily.   Rivaroxaban 15 MG Tabs tablet Commonly known as:  XARELTO Take 15 mg daily with supper by mouth. Monitor for bleeding/bruising   sennosides-docusate sodium 8.6-50 MG tablet Commonly known as:  SENOKOT-S Take 2 tablets by mouth 2 (two) times daily.   sodium phosphate enema Place 1 enema rectally every three (3) days as needed. If constipation not relieved by milk of magnesia or bisacodyl suppository. Follow package directions   sorbitol 70 % solution Take 30 mLs by mouth every 2 (two) hours as needed. Until pt has large BM   tamsulosin 0.4 MG Caps capsule Commonly known as:  FLOMAX Take 0.4 mg by mouth daily after supper. 30 mins after supper   Zinc Oxide 10 % Oint Apply liberal amount topically to areas of skin irritation as needed. Ok to leave at  bedside.       Review of Systems  Constitutional: Negative for activity change, appetite change, chills, diaphoresis and fever.  HENT: Negative for congestion, mouth sores, nosebleeds, postnasal drip, sneezing, sore throat, trouble swallowing and voice change.   Respiratory: Negative for apnea, cough, choking, chest tightness, shortness of breath and wheezing.   Cardiovascular: Negative for chest pain, palpitations and leg swelling.  Gastrointestinal: Negative for abdominal distention, abdominal pain, constipation, diarrhea and nausea.  Genitourinary: Negative for difficulty urinating, dysuria, frequency and urgency.  Musculoskeletal: Positive for arthralgias (typical arthritis), back pain, gait problem and joint swelling. Negative for myalgias.  Skin: Negative for color change, pallor, rash and wound.  Neurological: Positive for tremors and weakness. Negative for dizziness, syncope, speech difficulty, numbness and headaches.  Psychiatric/Behavioral: Negative for agitation and behavioral problems.  All other systems reviewed and are negative.   Immunization History  Administered Date(s) Administered  . Influenza-Unspecified 10/20/2014, 09/10/2016, 09/02/2017  . PPD Test 12/05/2015, 11/26/2016  . Pneumococcal Polysaccharide-23 03/13/2014   Pertinent  Health Maintenance Due  Topic Date Due  . PNA vac Low Risk Adult (2 of 2 - PCV13) 03/14/2015  . INFLUENZA VACCINE  Completed   No flowsheet data found. Functional Status Survey:    Vitals:   11/14/17 0941  BP: 138/62  Pulse: (!) 57  Resp: 18  Temp: 98.3 F (36.8 C)  TempSrc: Oral  SpO2: 99%  Weight: 205 lb 3.2 oz (93.1 kg)  Height: '5\' 11"'$  (1.803 m)   Body mass index is 28.62 kg/m. Physical Exam  Constitutional: He is oriented to person, place, and time. Vital signs are normal. He appears well-developed and well-nourished. He is active and cooperative. He does not appear ill. No distress.  HENT:  Head: Normocephalic and  atraumatic.  Mouth/Throat: Uvula is midline, oropharynx is clear and moist and mucous membranes are normal. Mucous membranes are not pale, not dry and not cyanotic.  Eyes: Conjunctivae, EOM and lids are normal. Pupils are equal, round, and reactive to light.  Neck: Trachea normal, normal range of motion and full passive range of motion without pain.  Neck supple. No JVD present. No tracheal deviation, no edema and no erythema present. No thyromegaly present.  Cardiovascular: Normal rate, regular rhythm, normal heart sounds, intact distal pulses and normal pulses. Exam reveals no gallop, no distant heart sounds and no friction rub.  No murmur heard. Pulses:      Dorsalis pedis pulses are 2+ on the right side, and 2+ on the left side.  No edema  Pulmonary/Chest: Effort normal and breath sounds normal. No accessory muscle usage. No respiratory distress. He has no decreased breath sounds. He has no wheezes. He has no rhonchi. He has no rales. He exhibits no tenderness.  Abdominal: Soft. Normal appearance and bowel sounds are normal. He exhibits no distension and no ascites. There is no tenderness.  Musculoskeletal: Normal range of motion. He exhibits no edema or tenderness.  Expected osteoarthritis, stiffness; Bilateral Calves soft, supple. Negative Homan's Sign. B- pedal pulses equal; generalized weakness  Neurological: He is alert and oriented to person, place, and time. He has normal strength. He exhibits abnormal muscle tone. Coordination and gait abnormal.  Parkinsons  Skin: Skin is warm, dry and intact. He is not diaphoretic. No cyanosis. No pallor. Nails show no clubbing.  Psychiatric: His speech is normal and behavior is normal. Judgment and thought content normal. His affect is blunt. Cognition and memory are impaired. He exhibits abnormal recent memory.  Nursing note and vitals reviewed.   Labs reviewed: Recent Labs    09/11/17 1506 10/27/17 1110 11/01/17 1335  NA 138 132* 138  K 4.8  3.8 3.6  CL 103 98* 106  CO2 30 22 25   GLUCOSE 110* 113* 125*  BUN 22* 47* 10  CREATININE 0.98 2.69* 0.95  CALCIUM 9.2 8.5* 8.4*   Recent Labs    03/09/17 1408 09/11/17 1506 11/01/17 1335  AST 17 15 19   ALT 13* 6* 6*  ALKPHOS 79 63 63  BILITOT 0.7 0.6 0.7  PROT 6.4* 6.7 6.1*  ALBUMIN 4.0 4.2 3.2*   Recent Labs    09/11/17 1506 10/27/17 1110 11/01/17 1335  WBC 6.3 13.5* 6.2  NEUTROABS 3.8 11.5* 3.9  HGB 13.1 11.8* 12.6*  HCT 39.3* 36.5* 37.6*  MCV 98.9 99.9 99.4  PLT 150 160 193   Lab Results  Component Value Date   TSH 0.892 05/18/2016   No results found for: HGBA1C No results found for: CHOL, HDL, LDLCALC, LDLDIRECT, TRIG, CHOLHDL  Significant Diagnostic Results in last 30 days:  No results found.  Assessment/Plan Auth[place] was seen today for medical management of chronic issues.  Diagnoses and all orders for this visit:  Essential hypertension  Complete heart block (HCC)  Chronic deep vein thrombosis (DVT) of proximal vein of left lower extremity ([place])   Above listed conditions stable  Continue current medication regimen  Follow up with cardiology as needed  Monitor for s/s of bleeding  Family/ staff Communication:   Total Time:  Documentation:  Face to Face:  Family/Phone:   Labs/tests ordered:  Not due  Medication list reviewed and assessed for continued appropriateness. Monthly medication orders reviewed and signed.  Vikki Ports, NP-C Geriatrics [facility] [phone], [place] [address] Cell Phone (Mon-Fri 8am-5pm):  [phone] On Call:  [phone] & follow prompts after 5pm & weekends Office Phone:  [phone] Office Fax:  [phone]

## 2017-12-29 NOTE — Assessment & Plan Note (Signed)
Pacemaker. Stable. No c/o chest pain/dyspnea

## 2018-01-02 ENCOUNTER — Other Ambulatory Visit
Admission: RE | Admit: 2018-01-02 | Discharge: 2018-01-02 | Disposition: A | Discharge status: Home / Self Care | Payer: Medicare Other | Source: Ambulatory Visit | Attending: Gerontology | Admitting: Gerontology

## 2018-01-02 DIAGNOSIS — R410 Disorientation, unspecified: Secondary | ICD-10-CM | POA: Diagnosis present

## 2018-01-02 LAB — URINALYSIS, COMPLETE (UACMP) WITH MICROSCOPIC
Bacteria, UA: NONE SEEN
Bilirubin Urine: NEGATIVE
GLUCOSE, UA: NEGATIVE mg/dL
Hgb urine dipstick: NEGATIVE
Ketones, ur: NEGATIVE mg/dL
Leukocytes, UA: NEGATIVE
Nitrite: NEGATIVE
PH: 6 (ref 5.0–8.0)
Protein, ur: NEGATIVE mg/dL
Specific Gravity, Urine: 1.011 (ref 1.005–1.030)

## 2018-01-04 LAB — URINE CULTURE

## 2018-01-05 ENCOUNTER — Other Ambulatory Visit
Admission: RE | Admit: 2018-01-05 | Discharge: 2018-01-05 | Disposition: A | Discharge status: Home / Self Care | Payer: Medicare Other | Source: Ambulatory Visit | Attending: Gerontology | Admitting: Gerontology

## 2018-01-05 DIAGNOSIS — R410 Disorientation, unspecified: Secondary | ICD-10-CM | POA: Diagnosis present

## 2018-01-05 DIAGNOSIS — R8290 Unspecified abnormal findings in urine: Secondary | ICD-10-CM | POA: Diagnosis present

## 2018-01-05 LAB — URINALYSIS, COMPLETE (UACMP) WITH MICROSCOPIC
Bacteria, UA: NONE SEEN
Bilirubin Urine: NEGATIVE
GLUCOSE, UA: NEGATIVE mg/dL
Hgb urine dipstick: NEGATIVE
Ketones, ur: NEGATIVE mg/dL
NITRITE: NEGATIVE
PH: 7 (ref 5.0–8.0)
Protein, ur: NEGATIVE mg/dL
SPECIFIC GRAVITY, URINE: 1.004 — AB (ref 1.005–1.030)
Squamous Epithelial / LPF: NONE SEEN

## 2018-01-08 LAB — URINE CULTURE: Culture: 20000 — AB

## 2018-01-12 ENCOUNTER — Encounter
Admission: RE | Admit: 2018-01-12 | Discharge: 2018-01-12 | Disposition: A | Discharge status: Home / Self Care | Payer: Medicare Other | Source: Ambulatory Visit | Attending: Internal Medicine | Admitting: Internal Medicine

## 2018-01-17 ENCOUNTER — Other Ambulatory Visit: Payer: Self-pay

## 2018-01-17 ENCOUNTER — Encounter: Payer: Self-pay | Admitting: Gerontology

## 2018-01-17 ENCOUNTER — Non-Acute Institutional Stay (SKILLED_NURSING_FACILITY): Payer: Medicare Other | Admitting: Gerontology

## 2018-01-17 DIAGNOSIS — G20 Parkinson's disease: Secondary | ICD-10-CM

## 2018-01-17 DIAGNOSIS — G3184 Mild cognitive impairment, so stated: Secondary | ICD-10-CM

## 2018-01-17 DIAGNOSIS — G912 (Idiopathic) normal pressure hydrocephalus: Secondary | ICD-10-CM | POA: Diagnosis not present

## 2018-01-17 MED ORDER — HYDROCODONE-ACETAMINOPHEN 5-325 MG PO TABS: 1.0000 | ORAL_TABLET | Freq: Four times a day (QID) | ORAL | 0 refills | Status: DC

## 2018-01-17 MED ORDER — HYDROCODONE-ACETAMINOPHEN 5-325 MG PO TABS: 1.0000 | ORAL_TABLET | ORAL | 0 refills | Status: DC | PRN

## 2018-01-17 NOTE — Telephone Encounter (Signed)
Rx sent to [facility] phone : [phone] , fax : [phone]  

## 2018-01-24 ENCOUNTER — Telehealth: Payer: Self-pay | Admitting: *Deleted

## 2018-01-24 NOTE — Telephone Encounter (Signed)
Staff calling to ensure that we received PPM transmission. Transmission received at 1:42pm.

## 2018-01-25 ENCOUNTER — Ambulatory Visit (INDEPENDENT_AMBULATORY_CARE_PROVIDER_SITE_OTHER): Payer: Medicare Other | Admitting: *Deleted

## 2018-01-25 DIAGNOSIS — I442 Atrioventricular block, complete: Secondary | ICD-10-CM

## 2018-01-25 NOTE — Progress Notes (Signed)
Remote pacemaker transmission.   

## 2018-01-31 ENCOUNTER — Encounter: Payer: Self-pay | Admitting: Cardiology

## 2018-02-07 ENCOUNTER — Other Ambulatory Visit: Payer: Self-pay

## 2018-02-07 MED ORDER — HYDROCODONE-ACETAMINOPHEN 5-325 MG PO TABS: 1.0000 | ORAL_TABLET | ORAL | 0 refills | Status: DC | PRN

## 2018-02-07 MED ORDER — HYDROCODONE-ACETAMINOPHEN 5-325 MG PO TABS: 1.0000 | ORAL_TABLET | Freq: Four times a day (QID) | ORAL | 0 refills | Status: DC

## 2018-02-07 NOTE — Telephone Encounter (Signed)
Rx sent to [facility] phone : [phone] , fax : [phone]  

## 2018-02-08 ENCOUNTER — Other Ambulatory Visit
Admission: RE | Admit: 2018-02-08 | Discharge: 2018-02-08 | Disposition: A | Discharge status: Home / Self Care | Payer: Medicare Other | Source: Ambulatory Visit | Attending: Gerontology | Admitting: Gerontology

## 2018-02-08 DIAGNOSIS — N390 Urinary tract infection, site not specified: Secondary | ICD-10-CM | POA: Diagnosis present

## 2018-02-08 LAB — URINALYSIS, COMPLETE (UACMP) WITH MICROSCOPIC
Bacteria, UA: NONE SEEN
Bilirubin Urine: NEGATIVE
GLUCOSE, UA: NEGATIVE mg/dL
HGB URINE DIPSTICK: NEGATIVE
Ketones, ur: NEGATIVE mg/dL
Leukocytes, UA: NEGATIVE
Nitrite: NEGATIVE
Protein, ur: NEGATIVE mg/dL
SPECIFIC GRAVITY, URINE: 1.009 (ref 1.005–1.030)
SQUAMOUS EPITHELIAL / LPF: NONE SEEN
pH: 7 (ref 5.0–8.0)

## 2018-02-09 ENCOUNTER — Encounter
Admission: RE | Admit: 2018-02-09 | Discharge: 2018-02-09 | Disposition: A | Discharge status: Home / Self Care | Payer: Medicare Other | Source: Ambulatory Visit | Attending: Internal Medicine | Admitting: Internal Medicine

## 2018-02-10 LAB — URINE CULTURE

## 2018-02-11 LAB — CUP PACEART REMOTE DEVICE CHECK
Battery Remaining Longevity: 50 mo
Battery Voltage: 2.78 V
Brady Statistic AP VP Percent: 27 %
Brady Statistic AS VP Percent: 72 %
Date Time Interrogation Session: 20190213184256
Implantable Lead Implant Date: 20130424
Implantable Lead Location: 753859
Implantable Lead Model: 5076
Implantable Pulse Generator Implant Date: 20130424
Lead Channel Impedance Value: 536 Ohm
Lead Channel Pacing Threshold Amplitude: 0.5 V
Lead Channel Pacing Threshold Pulse Width: 0.4 ms
Lead Channel Setting Pacing Amplitude: 1.5 V
Lead Channel Setting Pacing Amplitude: 2.5 V
Lead Channel Setting Pacing Pulse Width: 0.4 ms
Lead Channel Setting Sensing Sensitivity: 4 mV
MDC IDC LEAD IMPLANT DT: 20130424
MDC IDC LEAD LOCATION: 753860
MDC IDC MSMT BATTERY IMPEDANCE: 1124 Ohm
MDC IDC MSMT LEADCHNL RV IMPEDANCE VALUE: 638 Ohm
MDC IDC MSMT LEADCHNL RV PACING THRESHOLD AMPLITUDE: 0.75 V
MDC IDC MSMT LEADCHNL RV PACING THRESHOLD PULSEWIDTH: 0.4 ms
MDC IDC STAT BRADY AP VS PERCENT: 0 %
MDC IDC STAT BRADY AS VS PERCENT: 1 %

## 2018-02-19 ENCOUNTER — Non-Acute Institutional Stay (SKILLED_NURSING_FACILITY): Payer: Medicare Other | Admitting: Gerontology

## 2018-02-19 DIAGNOSIS — M159 Polyosteoarthritis, unspecified: Secondary | ICD-10-CM

## 2018-02-19 DIAGNOSIS — E538 Deficiency of other specified B group vitamins: Secondary | ICD-10-CM | POA: Diagnosis not present

## 2018-02-19 DIAGNOSIS — M48061 Spinal stenosis, lumbar region without neurogenic claudication: Secondary | ICD-10-CM | POA: Diagnosis not present

## 2018-02-19 DIAGNOSIS — M5416 Radiculopathy, lumbar region: Secondary | ICD-10-CM | POA: Diagnosis not present

## 2018-02-21 NOTE — Assessment & Plan Note (Addendum)
Slowly progressive.  Severe.  Patient is minimally ambulatory.  Some tremors, no noted hallucinations.  Symptoms controlled with Sinemet IR 25-250 mg 1 tablet 3 times daily.  Followed by Dr. Sherryll BurgerShah

## 2018-02-22 NOTE — Progress Notes (Signed)
Location:    Nursing Home Room Number: 336P Place of Service:  SNF (31) Provider:  Lorenso Quarry, NP-C  [name], MD  Patient Care Team: [name], MD as PCP - General (Family Medicine)  Extended Emergency Contact Information Primary Emergency Contact: Marva Panda [place] Home Phone: [phone] Mobile Phone: [phone] Relation: Spouse Secondary Emergency Contact: Gwynneth Munson [place] Mobile Phone: [phone] Relation: Son  Code Status: DNR Goals of care: Advanced Directive information Advanced Directives 01/17/2018  Does Patient Have a Medical Advance Directive? Yes  Type of Advance Directive Out of facility DNR (pink MOST or yellow form)  Does patient want to make changes to medical advance directive? No - Patient declined  Copy of Healthcare Power of Attorney in Chart? -  Would patient like information on creating a medical advance directive? -  Pre-existing out of facility DNR order (yellow form or pink MOST form) -     Chief Complaint  Patient presents with  . Medical Management of Chronic Issues    Routine Visit    HPI:  Pt is a 84 y.o. male seen today for medical management of chronic diseases.    Parkinson's disease (HCC) Slowly progressive.  Severe.  Patient is minimally ambulatory.  Some tremors, no noted hallucinations.  Symptoms controlled with Sinemet IR 25-250 mg 1 tablet 3 times daily.  Followed by Dr. Sherryll Burger  NPH (normal pressure hydrocephalus) Stable.  No current issues.  Patient being followed by neurology  MCI (mild cognitive impairment) Stable.  No medicinal management.  Patient resident on skilled nursing unit.  Diminished short-term memory.    Past Medical History:  Diagnosis Date  . Anxiety   . Arthritis   . Blood transfusion    hx of autologus transfusion  . Cataract cortical, senile   . Chicken pox   . Clotting disorder (HCC)   . Complete heart block (HCC)   . Depression    unspecified  . DVT (deep venous thrombosis) (HCC) 09/2014   left leg  . GERD (gastroesophageal reflux disease)   . Hereditary and idiopathic neuropathy   . History of depression   . Hypertension   . MCI (mild cognitive impairment) 10/22/2013  . Memory deficit 03/24/2011  . Memory loss   . Movement disorder   . Neuromuscular disorder (HCC)    peripheral  neuropathy  . Neuropathy   . Osteoarthritis   . Other hammer toe(s) (acquired), unspecified foot   . Pacemaker   . Parkinson's disease (HCC)   . Parkinsonism (HCC) 10/22/2013  . Shortness of breath   . Spinal stenosis   . Synovitis and tenosynovitis, unspecified   . Third degree heart block (HCC)   . Tremor    Past Surgical History:  Procedure Laterality Date  . CATARACT EXTRACTION  06/2005  . COLONOSCOPY  06/13/2006   PH colon polyps (MUS) repeat 7/20  . CORRECTION HAMMER TOE    . defibrilator  04/04/2012   Dual Chamber  . INSERT / REPLACE / REMOVE PACEMAKER  04/04/2012  . JOINT REPLACEMENT Right    THA  . JOINT REPLACEMENT Bilateral    TKA  . KNEE ARTHROSCOPY Bilateral    Right x 2 and Left  . KNEE SURGERY  [id number]  . LEAD REVISION N/A 04/05/2012   Procedure: LEAD REVISION;  Surgeon: Duke Salvia, MD;  Location: [facility] CATH LAB;  Service: Cardiovascular;  Laterality: N/A;  . PERMANENT PACEMAKER INSERTION N/A 04/04/2012   Procedure: PERMANENT PACEMAKER INSERTION;  Surgeon: Marinus MawGregg W Taylor, MD;  [facility];  Service: Cardiovascular;  Laterality: N/A;  . REPLACEMENT TOTAL HIP W/  RESURFACING IMPLANTS    . REPLACEMENT TOTAL KNEE BILATERAL  1997  . ROTATOR CUFF REPAIR  06/2008  . ROTATOR CUFF REPAIR Left 2010  . SHOULDER ARTHROSCOPY Left 06/19/2008   WITH SUBACROMIAL DECOMPRESSION. DISTAL CLAVICLE EXCISION. ARTHROSCOPIC ROTATOR CUFF REPAIR.  Marland Kitchen. TOTAL HIP ARTHROPLASTY  2009   right  . UMBILICAL HERNIA REPAIR  2001  . VASECTOMY      Allergies  Allergen Reactions  . Citalopram     Other reaction(s): Unknown   . Codeine Nausea And Vomiting  . Sertraline     Other reaction(s): Unknown Other reaction(s): Unknown  . Lodine [Etodolac] Rash  . Percocet [Oxycodone-Acetaminophen] Rash    Tolerates tylenol Patient is able to take this recently    Allergies as of 01/17/2018      Reactions   Citalopram    Other reaction(s): Unknown   Codeine Nausea And Vomiting   Sertraline    Other reaction(s): Unknown Other reaction(s): Unknown   Lodine [etodolac] Rash   Percocet [oxycodone-acetaminophen] Rash   Tolerates tylenol Patient is able to take this recently      Medication List        Accurate as of 01/17/18 11:59 PM. Always use your most recent med list.          acetaminophen 325 MG tablet Commonly known as:  TYLENOL Take 650 mg by mouth 4 (four) times daily as needed. Max of 3000 mg of Apap in 24 hrs   BISCOLAX 10 MG suppository Generic drug:  bisacodyl Place 10 mg rectally 2 (two) times daily as needed for moderate constipation. Give until BM.   carbidopa-levodopa 25-250 MG tablet Commonly known as:  SINEMET IR Take 1 tablet by mouth 3 (three) times daily.   cetirizine 10 MG tablet Commonly known as:  ZYRTEC Take 10 mg by mouth at bedtime.   cyanocobalamin 500 MCG tablet Take 500 mcg by mouth daily.   desvenlafaxine 50 MG 24 hr tablet Commonly known as:  PRISTIQ Take 50 mg by mouth every other day. At supper   FLONASE 50 MCG/ACT nasal spray Generic drug:  fluticasone Place 1 spray into both nostrils daily.   gabapentin 100 MG capsule Commonly known as:  NEURONTIN Take 1 capsule (100 mg) by mouth in the morning and 2 capsules (200 mg) by mouth at night.   guaiFENesin 100 MG/5ML liquid Commonly known as:  ROBITUSSIN Take 200 mg by mouth every 4 (four) hours as needed for cough. Notify provider if cough worsens/ continues longer than 3 days.   HYDROcodone-acetaminophen 5-325 MG tablet Commonly known as:  NORCO/VICODIN Take 1 tablet by mouth every 6 (six) hours.  Scheduled for severe pain. Maximum of 3000 mg of APAP in 24 hours.   HYDROcodone-acetaminophen 5-325 MG tablet Commonly known as:  NORCO/VICODIN Take 1 tablet by mouth every 4 (four) hours as needed. Breakthrough pain. . Max of 3000 mg of APAP in 24 hrs   ipratropium-albuterol 0.5-2.5 (3) MG/3ML Soln Commonly known as:  DUONEB Take 3 mLs by nebulization every 6 (six) hours as needed (wheezing and shortness of breath with cough).   lisinopril 40 MG tablet Commonly known as:  PRINIVIL,ZESTRIL TAKE ONE TABLET BY MOUTH EVERY DAY   magnesium hydroxide 400 MG/5ML suspension Commonly known as:  MILK OF MAGNESIA Take 30 mLs by mouth every 4 (four) hours as needed. If constipation/ no  BM for 2 days   mupirocin cream 2 % Commonly known as:  BACTROBAN Apply 1 application topically daily. Cleanse left cheek with ns, apply bactroban to affected area   ondansetron 4 MG tablet Commonly known as:  ZOFRAN Take 4 mg by mouth every 6 (six) hours as needed for nausea or vomiting.   OXYGEN Inhale 2 L into the lungs as needed. Check O2 sat prior to placing on patient and at least every 4 hours after applying. Notify MD if O2 sat drop below baseline while receiving oxygen or no improvement in dyspnea   OXYGEN Inhale 2 L into the lungs at bedtime. For sleep apnea like behavior   pantoprazole 40 MG tablet Commonly known as:  PROTONIX Take 40 mg by mouth daily.   Rivaroxaban 15 MG Tabs tablet Commonly known as:  XARELTO Take 15 mg daily with supper by mouth. Monitor for bleeding/bruising   sennosides-docusate sodium 8.6-50 MG tablet Commonly known as:  SENOKOT-S Take 2 tablets by mouth 2 (two) times daily.   sodium phosphate enema Place 1 enema rectally every three (3) days as needed. If constipation not relieved by milk of magnesia or bisacodyl suppository. Follow package directions   sorbitol 70 % solution Take 30 mLs by mouth every 2 (two) hours as needed. Until pt has large BM     tamsulosin 0.4 MG Caps capsule Commonly known as:  FLOMAX Take 0.4 mg by mouth daily after supper. 30 mins after supper   Zinc Oxide 10 % Oint Apply liberal amount topically to areas of skin irritation as needed. Ok to leave at bedside.       Review of Systems  Constitutional: Negative for activity change, appetite change, chills, diaphoresis and fever.  HENT: Negative for congestion, mouth sores, nosebleeds, postnasal drip, sneezing, sore throat, trouble swallowing and voice change.   Respiratory: Negative for apnea, cough, choking, chest tightness, shortness of breath and wheezing.   Cardiovascular: Negative for chest pain, palpitations and leg swelling.  Gastrointestinal: Negative for abdominal distention, abdominal pain, constipation, diarrhea and nausea.  Genitourinary: Negative for difficulty urinating, dysuria, frequency and urgency.  Musculoskeletal: Positive for arthralgias (typical arthritis), back pain, neck pain and neck stiffness. Negative for gait problem and myalgias.  Skin: Negative for color change, pallor, rash and wound.  Neurological: Positive for tremors and weakness. Negative for dizziness, syncope, facial asymmetry, speech difficulty, light-headedness, numbness and headaches.  Psychiatric/Behavioral: Negative for agitation and behavioral problems.  All other systems reviewed and are negative.   Immunization History  Administered Date(s) Administered  . Influenza-Unspecified 10/20/2014, 09/10/2016, 09/02/2017  . PPD Test 12/05/2015, 11/26/2016  . Pneumococcal Polysaccharide-23 03/13/2014   Pertinent  Health Maintenance Due  Topic Date Due  . PNA vac Low Risk Adult (2 of 2 - PCV13) 03/14/2015  . INFLUENZA VACCINE  Completed   No flowsheet data found. Functional Status Survey:    Vitals:   01/17/18 0925  SpO2: 99%  Weight: 196 lb 9.6 oz (89.2 kg)   Body mass index is 28.21 kg/m. Physical Exam  Constitutional: He is oriented to person, place, and  time. Vital signs are normal. He appears well-developed and well-nourished. He is active and cooperative. He does not appear ill. No distress.  HENT:  Head: Normocephalic and atraumatic.  Mouth/Throat: Uvula is midline, oropharynx is clear and moist and mucous membranes are normal. Mucous membranes are not pale, not dry and not cyanotic.  Eyes: Conjunctivae, EOM and lids are normal. Pupils are equal, round, and reactive to  light.  Neck: Trachea normal, normal range of motion and full passive range of motion without pain. Neck supple. No JVD present. No tracheal deviation, no edema and no erythema present. No thyromegaly present.  Cardiovascular: Normal rate, regular rhythm, normal heart sounds, intact distal pulses and normal pulses. Exam reveals no gallop, no distant heart sounds and no friction rub.  No murmur heard. Pulses:      Dorsalis pedis pulses are 2+ on the right side, and 2+ on the left side.  Mild BLE edema  Pulmonary/Chest: Effort normal and breath sounds normal. No accessory muscle usage. No respiratory distress. He has no decreased breath sounds. He has no wheezes. He has no rhonchi. He has no rales. He exhibits no tenderness.  Abdominal: Soft. Normal appearance and bowel sounds are normal. He exhibits no distension and no ascites. There is no tenderness.  Musculoskeletal: Normal range of motion. He exhibits no edema or tenderness.  Expected osteoarthritis, stiffness; Bilateral Calves soft, supple. Negative Homan's Sign. B- pedal pulses equal; generalized weakness, minimally ambulatory  Neurological: He is alert and oriented to person, place, and time. He has normal strength. He displays atrophy and tremor. A cranial nerve deficit and sensory deficit is present. He exhibits abnormal muscle tone. Coordination and gait abnormal.  Skin: Skin is warm, dry and intact. He is not diaphoretic. No cyanosis. There is pallor. Nails show no clubbing.  Psychiatric: He has a normal mood and affect.  His speech is normal and behavior is normal. Judgment and thought content normal. Cognition and memory are impaired. He exhibits abnormal recent memory.  Nursing note and vitals reviewed.   Labs reviewed: Recent Labs    09/11/17 1506 10/27/17 1110 11/01/17 1335  NA 138 132* 138  K 4.8 3.8 3.6  CL 103 98* 106  CO2 30 22 25   GLUCOSE 110* 113* 125*  BUN 22* 47* 10  CREATININE 0.98 2.69* 0.95  CALCIUM 9.2 8.5* 8.4*   Recent Labs    03/09/17 1408 09/11/17 1506 11/01/17 1335  AST 17 15 19   ALT 13* 6* 6*  ALKPHOS 79 63 63  BILITOT 0.7 0.6 0.7  PROT 6.4* 6.7 6.1*  ALBUMIN 4.0 4.2 3.2*   Recent Labs    09/11/17 1506 10/27/17 1110 11/01/17 1335  WBC 6.3 13.5* 6.2  NEUTROABS 3.8 11.5* 3.9  HGB 13.1 11.8* 12.6*  HCT 39.3* 36.5* 37.6*  MCV 98.9 99.9 99.4  PLT 150 160 193   Lab Results  Component Value Date   TSH 0.892 05/18/2016   No results found for: HGBA1C No results found for: CHOL, HDL, LDLCALC, LDLDIRECT, TRIG, CHOLHDL  Significant Diagnostic Results in last 30 days:  No results found.  Assessment/Plan Randy [name] was seen today for medical management of chronic issues.  Diagnoses and all orders for this visit:  Parkinson's disease (HCC)  NPH (normal pressure hydrocephalus)  MCI (mild cognitive impairment)   Above listed conditions stable  Continue current medication regimen  Continue routine follow-ups with neurology as instructed  Assist with ADLs as appropriate  Out of bed to chair at least daily  Encourage participation in activities and interaction with other residents for cognitive stimulation  Monitor for intermittent needs with meals, etc.  Safety precautions  Fall risk  Family/ staff Communication:   Total Time:  Documentation:  Face to Face:  Family/Phone:   Labs/tests ordered: Not due  Medication list reviewed and assessed for continued appropriateness. Monthly medication orders reviewed and signed.  Brynda Rim,  NP-C Geriatrics  [facility] [address], [place] [address] Cell Phone (Mon-Fri 8am-5pm):  [phone] On Call:  [phone] & follow prompts after 5pm & weekends Office Phone:  [phone] Office Fax:  [phone]

## 2018-02-22 NOTE — Assessment & Plan Note (Signed)
Stable.  No medicinal management.  Patient resident on skilled nursing unit.  Diminished short-term memory.

## 2018-02-22 NOTE — Assessment & Plan Note (Signed)
Stable.  No current issues.  Patient being followed by neurology

## 2018-02-22 NOTE — Progress Notes (Signed)
Location:      Place of Service:  SNF (31) Provider:  Lorenso Quarry, NP-C  Lauro Regulus, MD  Patient Care Team: Lauro Regulus, MD as PCP - General (Internal Medicine) Lorenso Quarry, NP as Nurse Practitioner (Family Medicine)  Extended Emergency Contact Information Primary Emergency Contact: Marva Panda [place] Home Phone: [phone] Mobile Phone: [phone] Relation: Spouse Secondary Emergency Contact: Gwynneth Munson [place] Mobile Phone: [phone] Relation: Son  Code Status: DNR Goals of care: Advanced Directive information Advanced Directives 01/17/2018  Does Patient Have a Medical Advance Directive? Yes  Type of Advance Directive Out of facility DNR (pink MOST or yellow form)  Does patient want to make changes to medical advance directive? No - Patient declined  Copy of Healthcare Power of Attorney in Chart? -  Would patient like information on creating a medical advance directive? -  Pre-existing out of facility DNR order (yellow form or pink MOST form) -     Chief Complaint  Patient presents with  . Medical Management of Chronic Issues    HPI:  Pt is a 84 y.o. male seen today for medical management of chronic diseases.    Osteoarthritis involving multiple joints on both sides of body Stable.  Patient reports pain is well controlled on scheduled regimen of Norco 5/325 mg 1 tablet p.o. every 6 hours scheduled and 1 every 4 hours as needed  Vitamin B12 deficiency Stable.  Symptoms managed with cyanocobalamin 500 mcg p.o. daily.  Levels due to be checked with next routine lab draw  Spinal stenosis of lumbar region with radiculopathy Stable.  Symptoms controlled with Norco 5/325 mg 1 tablet p.o. every 6 hours scheduled and 1 tablet p.o. every 4 hours as needed.  Patient is also on gabapentin 100 mg p.o. Daily.  Ongoing, progressive weakness.    Past Medical History:  Diagnosis Date  . Anxiety   .  Arthritis   . Blood transfusion    hx of autologus transfusion  . Cataract cortical, senile   . Chicken pox   . Clotting disorder (HCC)   . Complete heart block (HCC)   . Depression    unspecified  . DVT (deep venous thrombosis) (HCC) 09/2014   left leg  . GERD (gastroesophageal reflux disease)   . Hereditary and idiopathic neuropathy   . History of depression   . Hypertension   . MCI (mild cognitive impairment) 10/22/2013  . Memory deficit 03/24/2011  . Memory loss   . Movement disorder   . Neuromuscular disorder (HCC)    peripheral  neuropathy  . Neuropathy   . Osteoarthritis   . Other hammer toe(s) (acquired), unspecified foot   . Pacemaker   . Parkinson's disease (HCC)   . Parkinsonism (HCC) 10/22/2013  . Shortness of breath   . Spinal stenosis   . Synovitis and tenosynovitis, unspecified   . Third degree heart block (HCC)   . Tremor    Past Surgical History:  Procedure Laterality Date  . CATARACT EXTRACTION  06/2005  . COLONOSCOPY  06/13/2006   PH colon polyps (MUS) repeat 7/20  . CORRECTION HAMMER TOE    . defibrilator  04/04/2012   Dual Chamber  . INSERT / REPLACE / REMOVE PACEMAKER  04/04/2012  . JOINT REPLACEMENT Right    THA  . JOINT REPLACEMENT Bilateral    TKA  . KNEE ARTHROSCOPY Bilateral    Right x 2 and Left  . KNEE SURGERY  [phone]  . LEAD  REVISION N/A 04/05/2012   Procedure: LEAD REVISION;  Surgeon: Duke SalviaSteven C Klein, MD;  [facility];  Service: Cardiovascular;  Laterality: N/A;  . PERMANENT PACEMAKER INSERTION N/A 04/04/2012   Procedure: PERMANENT PACEMAKER INSERTION;  Surgeon: Marinus MawGregg W Taylor, MD;  Location: [facility] C[facility] LAB;  Service: Cardiovascular;  Laterality: N/A;  . REPLACEMENT TOTAL HIP W/  RESURFACING IMPLANTS    . REPLACEMENT TOTAL KNEE BILATERAL  1997  . ROTATOR CUFF REPAIR  06/2008  . ROTATOR CUFF REPAIR Left 2010  . SHOULDER ARTHROSCOPY Left 06/19/2008   WITH SUBACROMIAL DECOMPRESSION. DISTAL CLAVICLE EXCISION. ARTHROSCOPIC  ROTATOR CUFF REPAIR.  Marland Kitchen. TOTAL HIP ARTHROPLASTY  2009   right  . UMBILICAL HERNIA REPAIR  2001  . VASECTOMY      Allergies  Allergen Reactions  . Citalopram     Other reaction(s): Unknown  . Codeine Nausea And Vomiting  . Sertraline     Other reaction(s): Unknown Other reaction(s): Unknown  . Lodine [Etodolac] Rash  . Percocet [Oxycodone-Acetaminophen] Rash    Tolerates tylenol Patient is able to take this recently    Allergies as of 02/19/2018      Reactions   Citalopram    Other reaction(s): Unknown   Codeine Nausea And Vomiting   Sertraline    Other reaction(s): Unknown Other reaction(s): Unknown   Lodine [etodolac] Rash   Percocet [oxycodone-acetaminophen] Rash   Tolerates tylenol Patient is able to take this recently      Medication List        Accurate as of 02/19/18 11:59 PM. Always use your most recent med list.          acetaminophen 325 MG tablet Commonly known as:  TYLENOL Take 650 mg by mouth 4 (four) times daily as needed. Max of 3000 mg of Apap in 24 hrs   BISCOLAX 10 MG suppository Generic drug:  bisacodyl Place 10 mg rectally 2 (two) times daily as needed for moderate constipation. Give until BM.   carbidopa-levodopa 25-250 MG tablet Commonly known as:  SINEMET IR Take 1 tablet by mouth 3 (three) times daily.   cetirizine 10 MG tablet Commonly known as:  ZYRTEC Take 10 mg by mouth at bedtime.   cyanocobalamin 500 MCG tablet Take 500 mcg by mouth daily.   desvenlafaxine 50 MG 24 hr tablet Commonly known as:  PRISTIQ Take 50 mg by mouth every other day. At supper   FLONASE 50 MCG/ACT nasal spray Generic drug:  fluticasone Place 1 spray into both nostrils daily.   gabapentin 100 MG capsule Commonly known as:  NEURONTIN Take 1 capsule (100 mg) by mouth in the morning and 2 capsules (200 mg) by mouth at night.   guaiFENesin 100 MG/5ML liquid Commonly known as:  ROBITUSSIN Take 200 mg by mouth every 4 (four) hours as needed for cough.  Notify provider if cough worsens/ continues longer than 3 days.   HYDROcodone-acetaminophen 5-325 MG tablet Commonly known as:  NORCO/VICODIN Take 1 tablet by mouth every 6 (six) hours. Scheduled for severe pain. Maximum of 3000 mg of APAP in 24 hours.   HYDROcodone-acetaminophen 5-325 MG tablet Commonly known as:  NORCO/VICODIN Take 1 tablet by mouth every 4 (four) hours as needed. Breakthrough pain. . Max of 3000 mg of APAP in 24 hrs   ipratropium-albuterol 0.5-2.5 (3) MG/3ML Soln Commonly known as:  DUONEB Take 3 mLs by nebulization every 6 (six) hours as needed (wheezing and shortness of breath with cough).   lisinopril 40 MG tablet Commonly  known as:  PRINIVIL,ZESTRIL TAKE ONE TABLET BY MOUTH EVERY DAY   magnesium hydroxide 400 MG/5ML suspension Commonly known as:  MILK OF MAGNESIA Take 30 mLs by mouth every 4 (four) hours as needed. If constipation/ no BM for 2 days   mupirocin cream 2 % Commonly known as:  BACTROBAN Apply 1 application topically daily. Cleanse left cheek with ns, apply bactroban to affected area   ondansetron 4 MG tablet Commonly known as:  ZOFRAN Take 4 mg by mouth every 6 (six) hours as needed for nausea or vomiting.   OXYGEN Inhale 2 L into the lungs as needed. Check O2 sat prior to placing on patient and at least every 4 hours after applying. Notify MD if O2 sat drop below baseline while receiving oxygen or no improvement in dyspnea   OXYGEN Inhale 2 L into the lungs at bedtime. For sleep apnea like behavior   pantoprazole 40 MG tablet Commonly known as:  PROTONIX Take 40 mg by mouth daily.   Rivaroxaban 15 MG Tabs tablet Commonly known as:  XARELTO Take 15 mg daily with supper by mouth. Monitor for bleeding/bruising   sennosides-docusate sodium 8.6-50 MG tablet Commonly known as:  SENOKOT-S Take 2 tablets by mouth 2 (two) times daily.   sodium phosphate enema Place 1 enema rectally every three (3) days as needed. If constipation not  relieved by milk of magnesia or bisacodyl suppository. Follow package directions   sorbitol 70 % solution Take 30 mLs by mouth every 2 (two) hours as needed. Until pt has large BM   tamsulosin 0.4 MG Caps capsule Commonly known as:  FLOMAX Take 0.4 mg by mouth daily after supper. 30 mins after supper   Zinc Oxide 10 % Oint Apply liberal amount topically to areas of skin irritation as needed. Ok to leave at bedside.       Review of Systems  Constitutional: Negative for activity change, appetite change, chills, diaphoresis and fever.  HENT: Negative for congestion, mouth sores, nosebleeds, postnasal drip, sneezing, sore throat, trouble swallowing and voice change.   Respiratory: Negative for apnea, cough, choking, chest tightness, shortness of breath and wheezing.   Cardiovascular: Negative for chest pain, palpitations and leg swelling.  Gastrointestinal: Negative for abdominal distention, abdominal pain, constipation, diarrhea and nausea.  Genitourinary: Negative for difficulty urinating, dysuria, frequency and urgency.  Musculoskeletal: Positive for arthralgias (typical arthritis), back pain, gait problem, neck pain and neck stiffness. Negative for myalgias.  Skin: Negative for color change, pallor, rash and wound.  Neurological: Positive for tremors and weakness. Negative for dizziness, syncope, speech difficulty, numbness and headaches.  Psychiatric/Behavioral: Negative for agitation and behavioral problems.  All other systems reviewed and are negative.   Immunization History  Administered Date(s) Administered  . Influenza-Unspecified 10/20/2014, 09/10/2016, 09/02/2017  . PPD Test 12/05/2015, 11/26/2016  . Pneumococcal Polysaccharide-23 03/13/2014   Pertinent  Health Maintenance Due  Topic Date Due  . PNA vac Low Risk Adult (2 of 2 - PCV13) 03/14/2015  . INFLUENZA VACCINE  Completed   No flowsheet data found. Functional Status Survey:    Vitals:   02/07/18 0800  BP:  (!) 166/73  Pulse: 68  Resp: 16  Temp: (!) 97.5 F (36.4 C)  SpO2: 100%   There is no height or weight on file to calculate BMI. Physical Exam  Constitutional: He is oriented to person, place, and time. Vital signs are normal. He appears well-developed and well-nourished. He is active and cooperative. He does not appear ill. No  distress.  HENT:  Head: Normocephalic and atraumatic.  Mouth/Throat: Uvula is midline, oropharynx is clear and moist and mucous membranes are normal. Mucous membranes are not pale, not dry and not cyanotic.  Eyes: Conjunctivae, EOM and lids are normal. Pupils are equal, round, and reactive to light.  Neck: Trachea normal, normal range of motion and full passive range of motion without pain. Neck supple. No JVD present. No tracheal deviation, no edema and no erythema present. No thyromegaly present.  Cardiovascular: Normal rate, regular rhythm, normal heart sounds, intact distal pulses and normal pulses. Exam reveals no gallop, no distant heart sounds and no friction rub.  No murmur heard. Pulses:      Dorsalis pedis pulses are 2+ on the right side, and 2+ on the left side.  Minimal BLE edema  Pulmonary/Chest: Effort normal and breath sounds normal. No accessory muscle usage. No respiratory distress. He has no decreased breath sounds. He has no wheezes. He has no rhonchi. He has no rales. He exhibits no tenderness.  Abdominal: Soft. Normal appearance and bowel sounds are normal. He exhibits no distension and no ascites. There is no tenderness.  Musculoskeletal: Normal range of motion. He exhibits no edema or tenderness.  Expected osteoarthritis, stiffness; Bilateral Calves soft, supple. Negative Homan's Sign. B- pedal pulses equal; generalized weakness, nonambulatory, mobile in wheelchair  Neurological: He is alert and oriented to person, place, and time. He has normal strength. He displays atrophy and tremor. A cranial nerve deficit and sensory deficit is present. He  exhibits abnormal muscle tone. Coordination and gait abnormal.  Parkinson's disease  Skin: Skin is warm, dry and intact. He is not diaphoretic. No cyanosis. There is pallor. Nails show no clubbing.  Psychiatric: He has a normal mood and affect. His speech is normal. Judgment and thought content normal. He is withdrawn. Cognition and memory are impaired. He exhibits abnormal recent memory.  Nursing note and vitals reviewed.   Labs reviewed: Recent Labs    09/11/17 1506 10/27/17 1110 11/01/17 1335  NA 138 132* 138  K 4.8 3.8 3.6  CL 103 98* 106  CO2 30 22 25   GLUCOSE 110* 113* 125*  BUN 22* 47* 10  CREATININE 0.98 2.69* 0.95  CALCIUM 9.2 8.5* 8.4*   Recent Labs    03/09/17 1408 09/11/17 1506 11/01/17 1335  AST 17 15 19   ALT 13* 6* 6*  ALKPHOS 79 63 63  BILITOT 0.7 0.6 0.7  PROT 6.4* 6.7 6.1*  ALBUMIN 4.0 4.2 3.2*   Recent Labs    09/11/17 1506 10/27/17 1110 11/01/17 1335  WBC 6.3 13.5* 6.2  NEUTROABS 3.8 11.5* 3.9  HGB 13.1 11.8* 12.6*  HCT 39.3* 36.5* 37.6*  MCV 98.9 99.9 99.4  PLT 150 160 193   Lab Results  Component Value Date   TSH 0.892 05/18/2016   No results found for: HGBA1C No results found for: CHOL, HDL, LDLCALC, LDLDIRECT, TRIG, CHOLHDL  Significant Diagnostic Results in last 30 days:  No results found.  Assessment/Plan Melquiades was seen today for medical management of chronic issues.  Diagnoses and all orders for this visit:  Osteoarthritis involving multiple joints on both sides of body  Vitamin B12 deficiency  Spinal stenosis of lumbar region with radiculopathy   Above listed conditions stable  Continue current medication regimen  Encourage mobility  Out of bed to chair at least daily  Encourage participation in activities and increased interaction with other residents  Monitor lab values  Monitor for increased pain  Monitor  for increased sedation  Assist with ADLs as appropriate  Follow-up with orthopedist/neurologist  as needed  Family/ staff Communication:   Total Time:  Documentation:  Face to Face:  Family/Phone:   Labs/tests ordered: Not due  Medication list reviewed and assessed for continued appropriateness. Monthly medication orders reviewed and signed.  Brynda Rim, NP-C Geriatrics [facility] [phone], [place] [address] Cell Phone (Mon-Fri 8am-5pm):  [phone] On Call:  [phone] & follow prompts after 5pm & weekends Office Phone:  [phone] Office Fax:  [phone]

## 2018-02-22 NOTE — Assessment & Plan Note (Signed)
Stable.  Symptoms managed with cyanocobalamin 500 mcg p.o. daily.  Levels due to be checked with next routine lab draw

## 2018-02-22 NOTE — Assessment & Plan Note (Addendum)
Stable.  Symptoms controlled with Norco 5/325 mg 1 tablet p.o. every 6 hours scheduled and 1 tablet p.o. every 4 hours as needed.  Patient is also on gabapentin 100 mg p.o. Daily.  Ongoing, progressive weakness.

## 2018-02-22 NOTE — Assessment & Plan Note (Signed)
Stable.  Patient reports pain is well controlled on scheduled regimen of Norco 5/325 mg 1 tablet p.o. every 6 hours scheduled and 1 every 4 hours as needed

## 2018-03-12 ENCOUNTER — Inpatient Hospital Stay: Payer: Medicare Other | Attending: Hematology and Oncology

## 2018-03-12 ENCOUNTER — Encounter
Admission: RE | Admit: 2018-03-12 | Discharge: 2018-03-12 | Disposition: A | Discharge status: Home / Self Care | Payer: Medicare Other | Source: Ambulatory Visit | Attending: Internal Medicine | Admitting: Internal Medicine

## 2018-03-12 ENCOUNTER — Inpatient Hospital Stay (HOSPITAL_BASED_OUTPATIENT_CLINIC_OR_DEPARTMENT_OTHER): Payer: Medicare Other | Admitting: Hematology and Oncology

## 2018-03-12 VITALS — BP 148/79 | HR 65 | Temp 97.5°F | Resp 18 | Wt 195.0 lb

## 2018-03-12 DIAGNOSIS — Z7901 Long term (current) use of anticoagulants: Secondary | ICD-10-CM

## 2018-03-12 DIAGNOSIS — D649 Anemia, unspecified: Secondary | ICD-10-CM | POA: Insufficient documentation

## 2018-03-12 DIAGNOSIS — D696 Thrombocytopenia, unspecified: Secondary | ICD-10-CM | POA: Diagnosis not present

## 2018-03-12 DIAGNOSIS — I825Y2 Chronic embolism and thrombosis of unspecified deep veins of left proximal lower extremity: Secondary | ICD-10-CM

## 2018-03-12 DIAGNOSIS — Z87891 Personal history of nicotine dependence: Secondary | ICD-10-CM | POA: Insufficient documentation

## 2018-03-12 DIAGNOSIS — Z86718 Personal history of other venous thrombosis and embolism: Secondary | ICD-10-CM | POA: Diagnosis present

## 2018-03-12 DIAGNOSIS — G20 Parkinson's disease: Secondary | ICD-10-CM | POA: Insufficient documentation

## 2018-03-12 DIAGNOSIS — E538 Deficiency of other specified B group vitamins: Secondary | ICD-10-CM

## 2018-03-12 LAB — COMPREHENSIVE METABOLIC PANEL
ALT: 7 U/L — ABNORMAL LOW (ref 17–63)
AST: 15 U/L (ref 15–41)
Albumin: 4.2 g/dL (ref 3.5–5.0)
Alkaline Phosphatase: 63 U/L (ref 38–126)
Anion gap: 8 (ref 5–15)
BUN: 18 mg/dL (ref 6–20)
CO2: 26 mmol/L (ref 22–32)
Calcium: 9.1 mg/dL (ref 8.9–10.3)
Chloride: 104 mmol/L (ref 101–111)
Creatinine, Ser: 0.92 mg/dL (ref 0.61–1.24)
GFR calc Af Amer: >60 mL/min (ref >60)
GFR calc non Af Amer: >60 mL/min (ref >60)
Glucose, Bld: 115 mg/dL — ABNORMAL HIGH (ref 65–99)
Potassium: 3.9 mmol/L (ref 3.5–5.1)
Sodium: 138 mmol/L (ref 135–145)
Total Bilirubin: 0.9 mg/dL (ref 0.3–1.2)
Total Protein: 6.6 g/dL (ref 6.5–8.1)

## 2018-03-12 LAB — CBC WITH DIFFERENTIAL/PLATELET
Basophils Absolute: 0.1 10*3/uL (ref 0–0.1)
Basophils Relative: 1 %
Eosinophils Absolute: 0.4 10*3/uL (ref 0–0.7)
Eosinophils Relative: 7 %
HCT: 38.6 % — ABNORMAL LOW (ref 40.0–52.0)
Hemoglobin: 13.1 g/dL (ref 13.0–18.0)
Lymphocytes Relative: 32 %
Lymphs Abs: 1.8 10*3/uL (ref 1.0–3.6)
MCH: 33.7 pg (ref 26.0–34.0)
MCHC: 33.9 g/dL (ref 32.0–36.0)
MCV: 99.4 fL (ref 80.0–100.0)
Monocytes Absolute: 0.3 10*3/uL (ref 0.2–1.0)
Monocytes Relative: 5 %
Neutro Abs: 3 10*3/uL (ref 1.4–6.5)
Neutrophils Relative %: 55 %
Platelets: 137 10*3/uL — ABNORMAL LOW (ref 150–440)
RBC: 3.89 MIL/uL — ABNORMAL LOW (ref 4.40–5.90)
RDW: 13.9 % (ref 11.5–14.5)
WBC: 5.5 10*3/uL (ref 3.8–10.6)

## 2018-03-12 LAB — VITAMIN B12: Vitamin B-12: 615 pg/mL (ref 180–914)

## 2018-03-12 LAB — FERRITIN: Ferritin: 93 ng/mL (ref 24–336)

## 2018-03-12 NOTE — Progress Notes (Signed)
Pt in for 6 month follow up for DVT. Continues to take Xarelto.

## 2018-03-12 NOTE — Progress Notes (Signed)
[facility] day:  03/12/2018  Chief Complaint: Randy [name] is a 84 y.o. male with a history of left lower extremity deep venous thrombosis (DVT) who is seen for 6 month assessment on Xarelto.  HPI: The patient was last seen in the medical oncology clinic on 09/11/2017.  At that time, his mobility remained very limited.  Exam was stable.  CBC and chemistries were unremarkable. He continue Xarelto.  During the interim, patient notes that he is "weaker" overall. Patient complains of arthritic pain in his shoulder. His wife feels as if patient is becoming more kyphotic. Patient denies any increased pain or swelling in his LEFT lower extremity. Patient continues on the prescribed Xarelto with no perceived side effects. Patient denies any bruising or bleeding.   Patient is eating well. He has lost 11 pounds since his last visit to the clinic. Patient complains of generalized pain rated 3/10 today.       Past Medical History:  Diagnosis Date  . Anxiety   . Arthritis   . Blood transfusion    hx of autologus transfusion  . Cataract cortical, senile   . Chicken pox   . Clotting disorder ([place])   . Complete heart block ([place])   . Depression    unspecified  . DVT (deep venous thrombosis) ([place]) 09/2014   left leg  . GERD (gastroesophageal reflux disease)   . Hereditary and idiopathic neuropathy   . History of depression   . Hypertension   . MCI (mild cognitive impairment) 10/22/2013  . Memory deficit 03/24/2011  . Memory loss   . Movement disorder   . Neuromuscular disorder ([place])    peripheral  neuropathy  . Neuropathy   . Osteoarthritis   . Other hammer toe(s) (acquired), unspecified foot   . Pacemaker   . Parkinson's disease ([place])   . Parkinsonism ([place]) 10/22/2013  . Shortness of breath   . Spinal stenosis   . Synovitis and tenosynovitis, unspecified   . Third degree heart block ([place])   . Tremor     Past Surgical History:   Procedure Laterality Date  . CATARACT EXTRACTION  06/2005  . COLONOSCOPY  06/13/2006   PH colon polyps (MUS) repeat 7/20  . CORRECTION HAMMER TOE    . defibrilator  04/04/2012   Dual Chamber  . INSERT / REPLACE / REMOVE PACEMAKER  04/04/2012  . JOINT REPLACEMENT Right    THA  . JOINT REPLACEMENT Bilateral    TKA  . KNEE ARTHROSCOPY Bilateral    Right x 2 and Left  . KNEE SURGERY  [phone]  . LEAD REVISION N/A 04/05/2012   Procedure: LEAD REVISION;  Surgeon: Deboraha Sprang, MD;  Location: [facility] CATH LAB;  Service: Cardiovascular;  Laterality: N/A;  . PERMANENT PACEMAKER INSERTION N/A 04/04/2012   Procedure: PERMANENT PACEMAKER INSERTION;  Surgeon: Evans Lance, MD;  Location: [facility] CATH LAB;  Service: Cardiovascular;  Laterality: N/A;  . REPLACEMENT TOTAL HIP W/  RESURFACING IMPLANTS    . REPLACEMENT TOTAL KNEE BILATERAL  1997  . ROTATOR CUFF REPAIR  06/2008  . ROTATOR CUFF REPAIR Left 2010  . SHOULDER ARTHROSCOPY Left 06/19/2008   WITH SUBACROMIAL DECOMPRESSION. DISTAL CLAVICLE EXCISION. ARTHROSCOPIC ROTATOR CUFF REPAIR.  Marland Kitchen TOTAL HIP ARTHROPLASTY  2009   right  . UMBILICAL HERNIA REPAIR  2001  . VASECTOMY      Family History  Problem Relation Age of Onset  . Hypertension Mother   . Heart disease  Mother   . Hypertension Father   . Seizures Father        epilepsy  . Heart disease Father   . Prostate cancer Other   . Cancer Other   . Hypertension Other   . Asthma Other   . Hypertension Brother   . Breast cancer Daughter     Social History:  reports that he quit smoking about 44 years ago. His smoking use included cigarettes. He has a 25.00 pack-year smoking history. He has never used smokeless tobacco. He reports that he drinks alcohol. He reports that he does not use drugs.  He lives in [place]. They live in [place].  The patient is accompanied by his wife today.  Allergies:  Allergies  Allergen Reactions  . Citalopram     Other reaction(s): Unknown  .  Codeine Nausea And Vomiting  . Sertraline     Other reaction(s): Unknown Other reaction(s): Unknown  . Lodine [Etodolac] Rash  . Percocet [Oxycodone-Acetaminophen] Rash    Tolerates tylenol Patient is able to take this recently    Current Medications: Current Outpatient Medications  Medication Sig Dispense Refill  . acetaminophen (TYLENOL) 325 MG tablet Take 650 mg by mouth 4 (four) times daily as needed. Max of 3000 mg of Apap in 24 hrs    . bisacodyl (BISCOLAX) 10 MG suppository Place 10 mg rectally 2 (two) times daily as needed for moderate constipation. Give until BM.    . carbidopa-levodopa (SINEMET IR) 25-250 MG tablet Take 1 tablet by mouth 3 (three) times daily.    . cetirizine (ZYRTEC) 10 MG tablet Take 10 mg by mouth at bedtime.    . cyanocobalamin 500 MCG tablet Take 500 mcg by mouth daily.    . fluticasone (FLONASE) 50 MCG/ACT nasal spray Place 1 spray into both nostrils daily.     Marland Kitchen gabapentin (NEURONTIN) 100 MG capsule Take 100 mg by mouth daily.     Marland Kitchen guaiFENesin (ROBITUSSIN) 100 MG/5ML liquid Take 200 mg by mouth every 4 (four) hours as needed for cough. Notify provider if cough worsens/ continues longer than 3 days.    Marland Kitchen HYDROcodone-acetaminophen (NORCO/VICODIN) 5-325 MG tablet Take 1 tablet by mouth every 6 (six) hours. Scheduled for severe pain. Maximum of 3000 mg of APAP in 24 hours. 60 tablet 0  . HYDROcodone-acetaminophen (NORCO/VICODIN) 5-325 MG tablet Take 1 tablet by mouth every 4 (four) hours as needed. Breakthrough pain. . Max of 3000 mg of APAP in 24 hrs 60 tablet 0  . ipratropium-albuterol (DUONEB) 0.5-2.5 (3) MG/3ML SOLN Take 3 mLs by nebulization every 6 (six) hours as needed (wheezing and shortness of breath with cough).    Marland Kitchen lisinopril (PRINIVIL,ZESTRIL) 40 MG tablet TAKE ONE TABLET BY MOUTH EVERY DAY 30 tablet 3  . magnesium hydroxide (MILK OF MAGNESIA) 400 MG/5ML suspension Take 30 mLs by mouth every 4 (four) hours as needed. If constipation/ no BM for 2  days    . ondansetron (ZOFRAN) 4 MG tablet Take 4 mg by mouth every 6 (six) hours as needed for nausea or vomiting.    . OXYGEN Inhale 2 L into the lungs as needed. Check O2 sat prior to placing on patient and at least every 4 hours after applying. Notify MD if O2 sat drop below baseline while receiving oxygen or no improvement in dyspnea    . OXYGEN Inhale 2 L into the lungs at bedtime. For sleep apnea like behavior    . pantoprazole (PROTONIX) 40 MG tablet Take  40 mg by mouth daily.    . Rivaroxaban (XARELTO) 15 MG TABS tablet Take 15 mg daily with supper by mouth. Monitor for bleeding/bruising    . sennosides-docusate sodium (SENOKOT-S) 8.6-50 MG tablet Take 2 tablets by mouth 2 (two) times daily.     . sodium phosphate (FLEET) enema Place 1 enema rectally every three (3) days as needed. If constipation not relieved by milk of magnesia or bisacodyl suppository. Follow package directions    . sorbitol 70 % solution Take 30 mLs by mouth every 2 (two) hours as needed. Constipation. Give PO Q 2 hours until pt has large BM. Give first dose now along with Bisacodyl suppository    . tamsulosin (FLOMAX) 0.4 MG CAPS capsule Take 0.4 mg by mouth daily after supper. 30 mins after supper    . Zinc Oxide 10 % OINT Apply liberal amount topically to areas of skin irritation as needed. Ok to leave at bedside.     No current facility-administered medications for this visit.     Review of Systems:  GENERAL:  Feels "weaker".  Limited mobility.  Increased weakness.  No fevers or sweats.  Weight down 11 pounds.  PERFORMANCE STATUS (ECOG):  3 HEENT:  No visual changes, runny nose, sore throat, mouth sores or tenderness. Lungs: No shortness of breath or cough.  No hemoptysis. Cardiac:  No chest pain, palpitations, orthopnea, or PND. GI:  Eating "alright".  No nausea, vomiting, diarrhea, constipation, melena or hematochezia. GU:  Urinary incontinence. No urgency, frequency, dysuria, or hematuria. Musculoskeletal:   Back and hip pain (positional).  Right shoulder arthritis.  No muscle tenderness. Extremities:  No pain or swelling. Skin:  No rashes or skin changes. Neuro:  Decreased ability to walk.  Headaches.  No numbness, balance or coordination issues. Endocrine:  No diabetes, thyroid issues, hot flashes or night sweats. Psych:  No mood changes, depression or anxiety.  Days and nights mixed up. Pain:  3/10 - generalized.  Review of systems:  All other systems reviewed and found to be negative.  Physical Exam: Blood pressure (!) 148/79, pulse 65, temperature (!) 97.5 F (36.4 C), temperature source Tympanic, resp. rate 18, weight 195 lb (88.5 kg). GENERAL:  Well developed, well nourished, gentleman sitting comfortably in a wheelchair in the exam room in no acute distress.  His head is down. MENTAL STATUS:  Alert and oriented to person, place and time. HEAD:  Thin gray hair.  Normocephalic, atraumatic, face symmetric, no Cushingoid features. EYES:  Glasses.  Blue eyes.  Pupils equal round and reactive to light and accomodation.  No conjunctivitis or scleral icterus. ENT:  Oropharynx clear without lesion.  Tongue normal. Mucous membranes moist.  RESPIRATORY:  Poor respiratory excursion.  Clear to auscultation without rales, wheezes or rhonchi. CARDIOVASCULAR:  Regular rate and rhythm without murmur, rub or gallop. ABDOMEN:  Soft, non-tender, with active bowel sounds, and no hepatosplenomegaly.  No masses. SKIN:  No rashes, ulcers or lesions. EXTREMITIES:  Bilateral trace to 1+ lower extremity edema.  No skin discoloration or tenderness.  No palpable cords. LYMPH NODES:  No palpable cervical, supraclavicular, axillary or inguinal adenopathy  NEUROLOGICAL:  Unremarkable. PSYCH:  Appropriate.   Appointment on 03/12/2018  Component Date Value Ref Range Status  . Sodium 03/12/2018 138  135 - 145 mmol/L Final  . Potassium 03/12/2018 3.9  3.5 - 5.1 mmol/L Final  . Chloride 03/12/2018 104  101 - 111  mmol/L Final  . CO2 03/12/2018 26  22 - 32 mmol/L  Final  . Glucose, Bld 03/12/2018 115* 65 - 99 mg/dL Final  . BUN 03/12/2018 18  6 - 20 mg/dL Final  . Creatinine, Ser 03/12/2018 0.92  0.61 - 1.24 mg/dL Final  . Calcium 03/12/2018 9.1  8.9 - 10.3 mg/dL Final  . Total Protein 03/12/2018 6.6  6.5 - 8.1 g/dL Final  . Albumin 03/12/2018 4.2  3.5 - 5.0 g/dL Final  . AST 03/12/2018 15  15 - 41 U/L Final  . ALT 03/12/2018 7* 17 - 63 U/L Final  . Alkaline Phosphatase 03/12/2018 63  38 - 126 U/L Final  . Total Bilirubin 03/12/2018 0.9  0.3 - 1.2 mg/dL Final  . GFR calc non Af Amer 03/12/2018 >60  >60 mL/min Final  . GFR calc Af Amer 03/12/2018 >60  >60 mL/min Final   Comment: (NOTE) The eGFR has been calculated using the CKD EPI equation. This calculation has not been validated in all clinical situations. eGFR's persistently <60 mL/min signify possible Chronic Kidney Disease.   Georgiann Hahn gap 03/12/2018 8  5 - 15 Final   Performed at [facility], [address] [place], [place] [address]  . WBC 03/12/2018 5.5  3.8 - 10.6 K/uL Final  . RBC 03/12/2018 3.89* 4.40 - 5.90 MIL/uL Final  . Hemoglobin 03/12/2018 13.1  13.0 - 18.0 g/dL Final  . HCT 03/12/2018 38.6* 40.0 - 52.0 % Final  . MCV 03/12/2018 99.4  80.0 - 100.0 fL Final  . MCH 03/12/2018 33.7  26.0 - 34.0 pg Final  . MCHC 03/12/2018 33.9  32.0 - 36.0 g/dL Final  . RDW 03/12/2018 13.9  11.5 - 14.5 % Final  . Platelets 03/12/2018 137* 150 - 440 K/uL Final  . Neutrophils Relative % 03/12/2018 55  % Final  . Neutro Abs 03/12/2018 3.0  1.4 - 6.5 K/uL Final  . Lymphocytes Relative 03/12/2018 32  % Final  . Lymphs Abs 03/12/2018 1.8  1.0 - 3.6 K/uL Final  . Monocytes Relative 03/12/2018 5  % Final  . Monocytes Absolute 03/12/2018 0.3  0.2 - 1.0 K/uL Final  . Eosinophils Relative 03/12/2018 7  % Final  . Eosinophils Absolute 03/12/2018 0.4  0 - 0.7 K/uL Final  . Basophils Relative 03/12/2018 1  % Final  . Basophils Absolute 03/12/2018  0.1  0 - 0.1 K/uL Final   Performed at [facility], [address] [place], [place] [address]    Assessment:  Randy [name] is a 84 y.o. male with Parkinson's and a history of left lower extremity DVT in the setting of immobility and possible dehydration in 09/2014.  He remains on Xarelto secondary to his persistent immobility.  Hypercoagulable work-up on 03/17/2015 revealed the following normal studies:  Factor V Leiden, prothrombin gene mutation, lupus anticoagulant, and anticardiolipin antibodies.  He has no evidence of a myeloproliferative disorder.    Left lower extremity duplex on 03/17/2015 revealed interval improvement in the left femoral popliteal DVT.  Thrombus was nonocclusive.    He has thrombocytopenia possibly related to Mebetiq (?).  Platelet count ranges between 141,000 and 172,000.  He has a history of a mild normocytic anemia.  Diet is good.  B12 was low normal on 02/11/2016.  MMA was normal on 09/08/2016 thus r/o B12 deficiency.  B12 was 1051 on 03/09/2017.  He denies any melena or hematochezia.    Ferritin has been followed:  151 on 02/11/2016, 179 on 05/27/2016, 76 on 09/08/2016, 88 on 03/09/2017, 97 on 09/11/2017, and 93 on  03/12/2018.  Symptomatically, his mobility remains very limited.  Exam stable.  CBC and chemistries unremarkable.   Plan: 1.  Labs today:  CBC with diff, CMP, ferritin, B12. 2.  Continue Xarelto as previously prescribed. 3.  Increase oral fluid intake.   4.  RTC in 6 months for MD assessment and labs (CBC with diff, CMP, ferritin, B12).   Honor Loh, NP  03/12/2018, 4:04 PM   I saw and evaluated the patient, participating in the key portions of the service and reviewing pertinent diagnostic studies and records.  I reviewed the nurse practitioner's note and agree with the findings and the plan.  The assessment and plan were discussed with the patient.  A few questions were asked by the patient and answered.   Nolon Stalls,  MD 03/12/2018, 4:04 PM

## 2018-03-18 ENCOUNTER — Encounter: Payer: Self-pay | Admitting: Hematology and Oncology

## 2018-03-20 ENCOUNTER — Encounter: Payer: Self-pay | Admitting: Gerontology

## 2018-03-20 ENCOUNTER — Non-Acute Institutional Stay (SKILLED_NURSING_FACILITY): Payer: Medicare Other | Admitting: Gerontology

## 2018-03-20 DIAGNOSIS — H6123 Impacted cerumen, bilateral: Secondary | ICD-10-CM | POA: Diagnosis not present

## 2018-03-20 DIAGNOSIS — J309 Allergic rhinitis, unspecified: Secondary | ICD-10-CM

## 2018-03-20 DIAGNOSIS — G4739 Other sleep apnea: Secondary | ICD-10-CM

## 2018-03-20 DIAGNOSIS — H6120 Impacted cerumen, unspecified ear: Secondary | ICD-10-CM | POA: Insufficient documentation

## 2018-03-20 NOTE — Assessment & Plan Note (Signed)
Stable. No reports of apnea. O2 sats stable.

## 2018-03-20 NOTE — Assessment & Plan Note (Signed)
Having worsening symptoms right now. ++pollen count right now. C/O sore throat, "tickle" in the throat, c/o his nose is "running like water over a dam." Pt already on daily Flonase and Zyrtec

## 2018-03-20 NOTE — Progress Notes (Signed)
Location:    [facility] Home Room Number: 336 Place of Service:  SNF (31) Provider:  Lorenso Quarry, NP-C  Lauro Regulus, MD  Patient Care Team: Lauro Regulus, MD as PCP - General (Internal Medicine) Lorenso Quarry, NP as Nurse Practitioner (Family Medicine)  Extended Emergency Contact Information Primary Emergency Contact: Marva Panda [place] of [place] Home Phone: [phone] Mobile Phone: [phone] Relation: Spouse Secondary Emergency Contact: Gwynneth Munson [place] Mobile Phone: [phone] Relation: Son  Code Status:  DNR Goals of care: Advanced Directive information Advanced Directives 03/20/2018  Does Patient Have a Medical Advance Directive? Yes  Type of Advance Directive Out of facility DNR (pink MOST or yellow form)  Does patient want to make changes to medical advance directive? No - Patient declined  Copy of Healthcare Power of Attorney in Chart? -  Would patient like information on creating a medical advance directive? -  Pre-existing out of facility DNR order (yellow form or pink MOST form) Yellow form placed in chart (order not valid for inpatient use)     Chief Complaint  Patient presents with  . Medical Management of Chronic Issues    Routine Visit    HPI:  Pt is a 84 y.o. male seen today for medical management of chronic diseases.    Sleep apnea-like behavior Stable. No reports of apnea. O2 sats stable.  Cerumen impaction Not bothersome to the patient. However, unable to visualize TM on exam. Denies pain.  Allergic rhinitis Having worsening symptoms right now. ++pollen count right now. C/O sore throat, "tickle" in the throat, c/o his nose is "running like water over a dam." Pt already on daily Flonase and Zyrtec    Past Medical History:  Diagnosis Date  . Anxiety   . Arthritis   . Blood transfusion    hx of autologus transfusion  . Cataract cortical, senile   . Chicken pox   . Clotting disorder  (HCC)   . Complete heart block (HCC)   . Depression    unspecified  . DVT (deep venous thrombosis) (HCC) 09/2014   left leg  . GERD (gastroesophageal reflux disease)   . Hereditary and idiopathic neuropathy   . History of depression   . Hypertension   . MCI (mild cognitive impairment) 10/22/2013  . Memory deficit 03/24/2011  . Memory loss   . Movement disorder   . Neuromuscular disorder (HCC)    peripheral  neuropathy  . Neuropathy   . Osteoarthritis   . Other hammer toe(s) (acquired), unspecified foot   . Pacemaker   . Parkinson's disease (HCC)   . Parkinsonism (HCC) 10/22/2013  . Shortness of breath   . Spinal stenosis   . Synovitis and tenosynovitis, unspecified   . Third degree heart block (HCC)   . Tremor    Past Surgical History:  Procedure Laterality Date  . CATARACT EXTRACTION  06/2005  . COLONOSCOPY  06/13/2006   PH colon polyps (MUS) repeat 7/20  . CORRECTION HAMMER TOE    . defibrilator  04/04/2012   Dual Chamber  . INSERT / REPLACE / REMOVE PACEMAKER  04/04/2012  . JOINT REPLACEMENT Right    THA  . JOINT REPLACEMENT Bilateral    TKA  . KNEE ARTHROSCOPY Bilateral    Right x 2 and Left  . KNEE SURGERY  [phone]  . LEAD REVISION N/A 04/05/2012   Procedure: LEAD REVISION;  Surgeon: Duke Salvia, MD;  Location: [facility] CATH LAB;  Service: Cardiovascular;  Laterality: N/A;  .  PERMANENT PACEMAKER INSERTION N/A 04/04/2012   Procedure: PERMANENT PACEMAKER INSERTION;  Surgeon: Marinus MawGregg W Taylor, MD;  [facility];  Service: Cardiovascular;  Laterality: N/A;  . REPLACEMENT TOTAL HIP W/  RESURFACING IMPLANTS    . REPLACEMENT TOTAL KNEE BILATERAL  1997  . ROTATOR CUFF REPAIR  06/2008  . ROTATOR CUFF REPAIR Left 2010  . SHOULDER ARTHROSCOPY Left 06/19/2008   WITH SUBACROMIAL DECOMPRESSION. DISTAL CLAVICLE EXCISION. ARTHROSCOPIC ROTATOR CUFF REPAIR.  Marland Kitchen. TOTAL HIP ARTHROPLASTY  2009   right  . UMBILICAL HERNIA REPAIR  2001  . VASECTOMY      Allergies    Allergen Reactions  . Citalopram     Other reaction(s): Unknown  . Codeine Nausea And Vomiting  . Sertraline     Other reaction(s): Unknown Other reaction(s): Unknown  . Lodine [Etodolac] Rash  . Percocet [Oxycodone-Acetaminophen] Rash    Tolerates tylenol Patient is able to take this recently    Allergies as of 03/20/2018      Reactions   Citalopram    Other reaction(s): Unknown   Codeine Nausea And Vomiting   Sertraline    Other reaction(s): Unknown Other reaction(s): Unknown   Lodine [etodolac] Rash   Percocet [oxycodone-acetaminophen] Rash   Tolerates tylenol Patient is able to take this recently      Medication List        Accurate as of 03/20/18  2:37 PM. Always use your most recent med list.          acetaminophen 325 MG tablet Commonly known as:  TYLENOL Take 650 mg by mouth 4 (four) times daily as needed. Max of 3000 mg of Apap in 24 hrs   BISCOLAX 10 MG suppository Generic drug:  bisacodyl Place 10 mg rectally 2 (two) times daily as needed for moderate constipation. Give until BM.   carbidopa-levodopa 25-250 MG tablet Commonly known as:  SINEMET IR Take 1 tablet by mouth 3 (three) times daily.   cetirizine 10 MG tablet Commonly known as:  ZYRTEC Take 10 mg by mouth at bedtime.   cyanocobalamin 500 MCG tablet Take 500 mcg by mouth daily.   desvenlafaxine 50 MG 24 hr tablet Commonly known as:  PRISTIQ Take 50 mg by mouth every other day. at supper DO NOT CRUSH. depression   FLONASE 50 MCG/ACT nasal spray Generic drug:  fluticasone Place 1 spray into both nostrils daily.   gabapentin 100 MG capsule Commonly known as:  NEURONTIN Take 100 mg by mouth daily.   guaiFENesin 100 MG/5ML liquid Commonly known as:  ROBITUSSIN Take 200 mg by mouth every 4 (four) hours as needed for cough. Notify provider if cough worsens/ continues longer than 3 days.   HYDROcodone-acetaminophen 5-325 MG tablet Commonly known as:  NORCO/VICODIN Take 1 tablet by mouth  every 6 (six) hours. Scheduled for severe pain. Maximum of 3000 mg of APAP in 24 hours.   HYDROcodone-acetaminophen 5-325 MG tablet Commonly known as:  NORCO/VICODIN Take 1 tablet by mouth every 4 (four) hours as needed. Breakthrough pain. . Max of 3000 mg of APAP in 24 hrs   ipratropium-albuterol 0.5-2.5 (3) MG/3ML Soln Commonly known as:  DUONEB Take 3 mLs by nebulization every 6 (six) hours as needed (wheezing and shortness of breath with cough).   lisinopril 40 MG tablet Commonly known as:  PRINIVIL,ZESTRIL TAKE ONE TABLET BY MOUTH EVERY DAY   magnesium hydroxide 400 MG/5ML suspension Commonly known as:  MILK OF MAGNESIA Take 30 mLs by mouth every 4 (four) hours  as needed. If constipation/ no BM for 2 days   ondansetron 4 MG tablet Commonly known as:  ZOFRAN Take 4 mg by mouth every 6 (six) hours as needed for nausea or vomiting.   OXYGEN Inhale 2 L into the lungs as needed. Check O2 sat prior to placing on patient and at least every 4 hours after applying. Notify MD if O2 sat drop below baseline while receiving oxygen or no improvement in dyspnea   OXYGEN Inhale 2 L into the lungs at bedtime. For sleep apnea like behavior   pantoprazole 40 MG tablet Commonly known as:  PROTONIX Take 40 mg by mouth daily.   Rivaroxaban 15 MG Tabs tablet Commonly known as:  XARELTO Take 15 mg by mouth daily with supper. for anticoagulation. Monitor for bleeding/bruising.   sennosides-docusate sodium 8.6-50 MG tablet Commonly known as:  SENOKOT-S Take 2 tablets by mouth 2 (two) times daily.   sodium phosphate enema Place 1 enema rectally every three (3) days as needed. If constipation not relieved by milk of magnesia or bisacodyl suppository. Follow package directions   sorbitol 70 % solution Take 30 mLs by mouth every 2 (two) hours as needed. Constipation. Give PO Q 2 hours until pt has large BM. Give first dose now along with Bisacodyl suppository   tamsulosin 0.4 MG Caps  capsule Commonly known as:  FLOMAX Take 0.4 mg by mouth daily after supper. 30 mins after supper   Zinc Oxide 10 % Oint Apply liberal amount topically to areas of skin irritation as needed. Ok to leave at bedside.       Review of Systems  Constitutional: Negative for activity change, appetite change, chills, diaphoresis and fever.  HENT: Positive for postnasal drip and sore throat. Negative for congestion, ear discharge, ear pain, mouth sores, nosebleeds, rhinorrhea, sinus pressure, sinus pain, sneezing, trouble swallowing and voice change.   Respiratory: Negative for apnea, cough, choking, chest tightness, shortness of breath and wheezing.   Cardiovascular: Negative for chest pain, palpitations and leg swelling.  Gastrointestinal: Negative for abdominal distention, abdominal pain, constipation, diarrhea and nausea.  Genitourinary: Negative for difficulty urinating, dysuria, frequency and urgency.  Musculoskeletal: Positive for arthralgias (typical arthritis), back pain, gait problem and myalgias.  Skin: Negative for color change, pallor, rash and wound.  Neurological: Positive for weakness. Negative for dizziness, tremors, syncope, speech difficulty, numbness and headaches.  Psychiatric/Behavioral: Negative for agitation and behavioral problems.  All other systems reviewed and are negative.   Immunization History  Administered Date(s) Administered  . Influenza-Unspecified 10/20/2014, 09/10/2016, 09/02/2017  . PPD Test 12/05/2015, 11/26/2016  . Pneumococcal Polysaccharide-23 03/13/2014   Pertinent  Health Maintenance Due  Topic Date Due  . PNA vac Low Risk Adult (2 of 2 - PCV13) 03/14/2015  . INFLUENZA VACCINE  07/12/2018   No flowsheet data found. Functional Status Survey:    Vitals:   03/20/18 1013  BP: (!) 166/73  Pulse: 68  Resp: 16  Temp: (!) 97.5 F (36.4 C)  TempSrc: Oral  SpO2: 98%  Weight: 195 lb 9.6 oz (88.7 kg)  Height: 5\' 10"  (1.778 m)   Body mass index  is 28.07 kg/m. Physical Exam  Constitutional: He is oriented to person, place, and time. Vital signs are normal. He appears well-developed and well-nourished. He is active and cooperative. He does not appear ill. No distress.  HENT:  Head: Normocephalic and atraumatic.  Mouth/Throat: Uvula is midline, oropharynx is clear and moist and mucous membranes are normal. Mucous membranes are not  pale, not dry and not cyanotic.  Eyes: Pupils are equal, round, and reactive to light. Conjunctivae, EOM and lids are normal.  Neck: Trachea normal, normal range of motion and full passive range of motion without pain. Neck supple. No JVD present. No tracheal deviation, no edema and no erythema present. No thyromegaly present.  Cardiovascular: Normal rate, regular rhythm, normal heart sounds, intact distal pulses and normal pulses. Exam reveals no gallop, no distant heart sounds and no friction rub.  No murmur heard. Pulses:      Dorsalis pedis pulses are 2+ on the right side, and 2+ on the left side.  No edema  Pulmonary/Chest: Effort normal and breath sounds normal. No accessory muscle usage. No respiratory distress. He has no decreased breath sounds. He has no wheezes. He has no rhonchi. He has no rales. He exhibits no tenderness.  Abdominal: Soft. Normal appearance and bowel sounds are normal. He exhibits no distension and no ascites. There is no tenderness.  Musculoskeletal: Normal range of motion. He exhibits no edema or tenderness.  Expected osteoarthritis, stiffness; Bilateral Calves soft, supple. Negative Homan's Sign. B- pedal pulses equal; generalized weakness, mobile in scooter  Neurological: He is alert and oriented to person, place, and time. He has normal strength. He displays atrophy. He exhibits abnormal muscle tone. Coordination and gait abnormal.  Skin: Skin is warm, dry and intact. He is not diaphoretic. No cyanosis. No pallor. Nails show no clubbing.  Psychiatric: His speech is normal and  behavior is normal. Judgment and thought content normal. His affect is blunt. Cognition and memory are impaired.  Nursing note and vitals reviewed.   Labs reviewed: Recent Labs    10/27/17 1110 11/01/17 1335 03/12/18 1454  NA 132* 138 138  K 3.8 3.6 3.9  CL 98* 106 104  CO2 22 25 26   GLUCOSE 113* 125* 115*  BUN 47* 10 18  CREATININE 2.69* 0.95 0.92  CALCIUM 8.5* 8.4* 9.1   Recent Labs    09/11/17 1506 11/01/17 1335 03/12/18 1454  AST 15 19 15   ALT 6* 6* 7*  ALKPHOS 63 63 63  BILITOT 0.6 0.7 0.9  PROT 6.7 6.1* 6.6  ALBUMIN 4.2 3.2* 4.2   Recent Labs    10/27/17 1110 11/01/17 1335 03/12/18 1454  WBC 13.5* 6.2 5.5  NEUTROABS 11.5* 3.9 3.0  HGB 11.8* 12.6* 13.1  HCT 36.5* 37.6* 38.6*  MCV 99.9 99.4 99.4  PLT 160 193 137*   Lab Results  Component Value Date   TSH 0.892 05/18/2016   No results found for: HGBA1C No results found for: CHOL, HDL, LDLCALC, LDLDIRECT, TRIG, CHOLHDL  Significant Diagnostic Results in last 30 days:  No results found.  Assessment/Plan Kouper was seen today for medical management of chronic issues.  Diagnoses and all orders for this visit:  Sleep apnea-like behavior  Bilateral impacted cerumen  Allergic rhinitis, unspecified seasonality, unspecified trigger   sleep apnea like behavior stable  Cerumen and allergic rhinitis variable  Continue current medication regimen, except  Add Debrox 6.5 %- Fill B- ear canals with drops, place cottonball in ear after drops Q 12 hours x 4 days. Flush ears with warm, soapy water at completion of treatment.  Add- Atrovent 0.03% nasal spray- 2 sprays in each nostril BID for PND  Safety precautions  Fall precautions  Assist pt with ADLs and meals as appropriate  Continue Palliative Care services  Family/ staff Communication:   Total Time:  Documentation:  Face to Face:  Family/Phone:  Labs/tests ordered:  Recent labs reviewed  Medication list reviewed and assessed for  continued appropriateness. Monthly medication orders reviewed and signed.  Brynda Rim, NP-C Geriatrics [facility] [phone], [place] [address] Cell Phone (Mon-Fri 8am-5pm):  [phone] On Call:  [phone] & follow prompts after 5pm & weekends Office Phone:  [phone] Office Fax:  [phone]

## 2018-03-20 NOTE — Assessment & Plan Note (Signed)
Not bothersome to the patient. However, unable to visualize TM on exam. Denies pain.

## 2018-04-11 ENCOUNTER — Encounter
Admission: RE | Admit: 2018-04-11 | Discharge: 2018-04-11 | Disposition: A | Discharge status: Home / Self Care | Payer: Medicare Other | Source: Ambulatory Visit | Attending: Internal Medicine | Admitting: Internal Medicine

## 2018-04-23 ENCOUNTER — Other Ambulatory Visit
Admission: RE | Admit: 2018-04-23 | Discharge: 2018-04-23 | Disposition: A | Discharge status: Home / Self Care | Payer: Medicare Other | Source: Ambulatory Visit | Attending: Internal Medicine | Admitting: Internal Medicine

## 2018-04-23 DIAGNOSIS — R410 Disorientation, unspecified: Secondary | ICD-10-CM | POA: Diagnosis present

## 2018-04-24 LAB — URINE CULTURE: CULTURE: NO GROWTH

## 2018-04-26 ENCOUNTER — Non-Acute Institutional Stay (SKILLED_NURSING_FACILITY): Payer: Medicare Other | Admitting: Gerontology

## 2018-04-26 ENCOUNTER — Ambulatory Visit (INDEPENDENT_AMBULATORY_CARE_PROVIDER_SITE_OTHER): Payer: Medicare Other | Admitting: *Deleted

## 2018-04-26 ENCOUNTER — Encounter: Payer: Self-pay | Admitting: Gerontology

## 2018-04-26 ENCOUNTER — Telehealth: Payer: Self-pay | Admitting: Cardiology

## 2018-04-26 DIAGNOSIS — F325 Major depressive disorder, single episode, in full remission: Secondary | ICD-10-CM | POA: Diagnosis not present

## 2018-04-26 DIAGNOSIS — I442 Atrioventricular block, complete: Secondary | ICD-10-CM

## 2018-04-26 DIAGNOSIS — M25552 Pain in left hip: Secondary | ICD-10-CM

## 2018-04-26 DIAGNOSIS — R351 Nocturia: Secondary | ICD-10-CM

## 2018-04-26 NOTE — Telephone Encounter (Signed)
Confirmed remote transmission w/ pt wife.   

## 2018-04-26 NOTE — Progress Notes (Signed)
Remote pacemaker transmission.   

## 2018-04-27 ENCOUNTER — Encounter: Payer: Self-pay | Admitting: Cardiology

## 2018-04-27 LAB — CUP PACEART REMOTE DEVICE CHECK
Battery Impedance: 1290 Ohm
Battery Remaining Longevity: 44 mo
Battery Voltage: 2.77 V
Brady Statistic AP VS Percent: 0 %
Date Time Interrogation Session: 20190516180722
Implantable Lead Location: 753859
Implantable Pulse Generator Implant Date: 20130424
Lead Channel Impedance Value: 498 Ohm
Lead Channel Pacing Threshold Amplitude: 0.5 V
Lead Channel Pacing Threshold Pulse Width: 0.4 ms
Lead Channel Setting Pacing Amplitude: 1.5 V
Lead Channel Setting Pacing Amplitude: 2.5 V
Lead Channel Setting Pacing Pulse Width: 0.4 ms
Lead Channel Setting Sensing Sensitivity: 4 mV
MDC IDC LEAD IMPLANT DT: 20130424
MDC IDC LEAD IMPLANT DT: 20130424
MDC IDC LEAD LOCATION: 753860
MDC IDC MSMT LEADCHNL RA PACING THRESHOLD PULSEWIDTH: 0.4 ms
MDC IDC MSMT LEADCHNL RA SENSING INTR AMPL: 2.8 mV
MDC IDC MSMT LEADCHNL RV IMPEDANCE VALUE: 580 Ohm
MDC IDC MSMT LEADCHNL RV PACING THRESHOLD AMPLITUDE: 0.625 V
MDC IDC STAT BRADY AP VP PERCENT: 30 %
MDC IDC STAT BRADY AS VP PERCENT: 68 %
MDC IDC STAT BRADY AS VS PERCENT: 1 %

## 2018-05-01 NOTE — Assessment & Plan Note (Signed)
Stable. No s/s of worsening or recurrent depression. Symptoms managed with Pristiq 50 mg EOD

## 2018-05-01 NOTE — Assessment & Plan Note (Signed)
Stable. No recent complaints of worsening symptoms. On Flomax 0.4 mg po Q Day

## 2018-05-01 NOTE — Progress Notes (Signed)
Location:    [facility] Room Number: 336P Place of Service:  SNF (31) Provider:  Lorenso Quarry, NP-C  Lauro Regulus, MD  Patient Care Team: Lauro Regulus, MD as PCP - General (Internal Medicine) Lorenso Quarry, NP as Nurse Practitioner (Family Medicine)  Extended Emergency Contact Information Primary Emergency Contact: Marva Panda [place] Home Phone: [phone] Mobile Phone: [phone] Relation: Spouse Secondary Emergency Contact: Gwynneth Munson [place] Mobile Phone: [phone] Relation: Son  Code Status:  DNR Goals of care: Advanced Directive information Advanced Directives 04/26/2018  Does Patient Have a Medical Advance Directive? Yes  Type of Advance Directive Out of facility DNR (pink MOST or yellow form)  Does patient want to make changes to medical advance directive? No - Patient declined  Copy of Healthcare Power of Attorney in Chart? -  Would patient like information on creating a medical advance directive? -  Pre-existing out of facility DNR order (yellow form or pink MOST form) Yellow form placed in chart (order not valid for inpatient use)     Chief Complaint  Patient presents with  . Medical Management of Chronic Issues    Routine Visit    HPI:  Pt is a 84 y.o. male seen today for medical management of chronic diseases.    Nocturia more than twice per night Stable. No recent complaints of worsening symptoms. On Flomax 0.4 mg po Q Day  Major depression in remission (HCC) Stable. No s/s of worsening or recurrent depression. Symptoms managed with Pristiq 50 mg EOD  Hip pain Stable. Pt no longer complains of constant pain. Symptoms controlled with Neurontin 100 mg po Q Day, Norco 5/325 mg Q 6 hours scheduled and Q 4 hours prn    Past Medical History:  Diagnosis Date  . Anxiety   . Arthritis   . Blood transfusion    hx of autologus transfusion  . Cataract cortical, senile   . Chicken pox    . Clotting disorder (HCC)   . Complete heart block (HCC)   . Depression    unspecified  . DVT (deep venous thrombosis) (HCC) 09/2014   left leg  . GERD (gastroesophageal reflux disease)   . Hereditary and idiopathic neuropathy   . History of depression   . Hypertension   . MCI (mild cognitive impairment) 10/22/2013  . Memory deficit 03/24/2011  . Memory loss   . Movement disorder   . Neuromuscular disorder (HCC)    peripheral  neuropathy  . Neuropathy   . Osteoarthritis   . Other hammer toe(s) (acquired), unspecified foot   . Pacemaker   . Parkinson's disease (HCC)   . Parkinsonism (HCC) 10/22/2013  . Shortness of breath   . Spinal stenosis   . Synovitis and tenosynovitis, unspecified   . Third degree heart block (HCC)   . Tremor    Past Surgical History:  Procedure Laterality Date  . CATARACT EXTRACTION  06/2005  . COLONOSCOPY  06/13/2006   PH colon polyps (MUS) repeat 7/20  . CORRECTION HAMMER TOE    . defibrilator  04/04/2012   Dual Chamber  . INSERT / REPLACE / REMOVE PACEMAKER  04/04/2012  . JOINT REPLACEMENT Right    THA  . JOINT REPLACEMENT Bilateral    TKA  . KNEE ARTHROSCOPY Bilateral    Right x 2 and Left  . KNEE SURGERY  [phone]  . LEAD REVISION N/A 04/05/2012   Procedure: LEAD REVISION;  Surgeon: Duke Salvia, MD;  Location: [facility]  CATH LAB;  Service: Cardiovascular;  Laterality: N/A;  . PERMANENT PACEMAKER INSERTION N/A 04/04/2012   Procedure: PERMANENT PACEMAKER INSERTION;  Surgeon: Marinus Maw, MD;  Location: [facility] CATH LAB;  Service: Cardiovascular;  Laterality: N/A;  . REPLACEMENT TOTAL HIP W/  RESURFACING IMPLANTS    . REPLACEMENT TOTAL KNEE BILATERAL  1997  . ROTATOR CUFF REPAIR  06/2008  . ROTATOR CUFF REPAIR Left 2010  . SHOULDER ARTHROSCOPY Left 06/19/2008   WITH SUBACROMIAL DECOMPRESSION. DISTAL CLAVICLE EXCISION. ARTHROSCOPIC ROTATOR CUFF REPAIR.  Marland Kitchen TOTAL HIP ARTHROPLASTY  2009   right  . UMBILICAL HERNIA REPAIR  2001  . VASECTOMY       Allergies  Allergen Reactions  . Citalopram     Other reaction(s): Unknown  . Codeine Nausea And Vomiting  . Sertraline     Other reaction(s): Unknown Other reaction(s): Unknown  . Lodine [Etodolac] Rash  . Percocet [Oxycodone-Acetaminophen] Rash    Tolerates tylenol Patient is able to take this recently    Allergies as of 04/26/2018      Reactions   Citalopram    Other reaction(s): Unknown   Codeine Nausea And Vomiting   Sertraline    Other reaction(s): Unknown Other reaction(s): Unknown   Lodine [etodolac] Rash   Percocet [oxycodone-acetaminophen] Rash   Tolerates tylenol Patient is able to take this recently      Medication List        Accurate as of 04/26/18 11:59 PM. Always use your most recent med list.          acetaminophen 325 MG tablet Commonly known as:  TYLENOL Take 650 mg by mouth 4 (four) times daily as needed. Max of 3000 mg of Apap in 24 hrs   BISCOLAX 10 MG suppository Generic drug:  bisacodyl Place 10 mg rectally 2 (two) times daily as needed for moderate constipation. Give until BM.   carbidopa-levodopa 25-250 MG tablet Commonly known as:  SINEMET IR Take 1 tablet by mouth 3 (three) times daily.   cetirizine 10 MG tablet Commonly known as:  ZYRTEC Take 10 mg by mouth at bedtime.   desvenlafaxine 50 MG 24 hr tablet Commonly known as:  PRISTIQ Take 50 mg by mouth every other day. at supper DO NOT CRUSH. depression   FLONASE 50 MCG/ACT nasal spray Generic drug:  fluticasone Place 1 spray into both nostrils daily.   gabapentin 100 MG capsule Commonly known as:  NEURONTIN Take 100 mg by mouth daily.   guaiFENesin 100 MG/5ML liquid Commonly known as:  ROBITUSSIN Take 200 mg by mouth every 4 (four) hours as needed for cough. Notify provider if cough worsens/ continues longer than 3 days.   HYDROcodone-acetaminophen 5-325 MG tablet Commonly known as:  NORCO/VICODIN Take 1 tablet by mouth every 4 (four) hours as needed. breakthrough  pain. Maximum dose of 3000 mg of Acetaminophen in 24 hours   HYDROcodone-acetaminophen 5-325 MG tablet Commonly known as:  NORCO/VICODIN Take 1 tablet by mouth every 6 (six) hours. pain. hold for sedation. may give prn doses in addition to scheduled for severe pain. Maximum of 3000 mg of Acetaminophen in 24 hours   ipratropium-albuterol 0.5-2.5 (3) MG/3ML Soln Commonly known as:  DUONEB Take 3 mLs by nebulization every 6 (six) hours as needed (wheezing and shortness of breath with cough).   lisinopril 40 MG tablet Commonly known as:  PRINIVIL,ZESTRIL TAKE ONE TABLET BY MOUTH EVERY DAY   magnesium hydroxide 400 MG/5ML suspension Commonly known as:  MILK OF MAGNESIA Take 30  mLs by mouth every 4 (four) hours as needed. If constipation/ no BM for 2 days   ondansetron 4 MG tablet Commonly known as:  ZOFRAN Take 4 mg by mouth every 6 (six) hours as needed for nausea or vomiting.   OXYGEN Inhale 2 L into the lungs as needed. Check O2 sat prior to placing on patient and at least every 4 hours after applying. Notify MD if O2 sat drop below baseline while receiving oxygen or no improvement in dyspnea   OXYGEN Inhale 2 L into the lungs at bedtime. For sleep apnea like behavior   pantoprazole 40 MG tablet Commonly known as:  PROTONIX Take 40 mg by mouth daily.   Rivaroxaban 15 MG Tabs tablet Commonly known as:  XARELTO Take 15 mg by mouth daily with supper. for anticoagulation. Monitor for bleeding/bruising.   sennosides-docusate sodium 8.6-50 MG tablet Commonly known as:  SENOKOT-S Take 2 tablets by mouth 2 (two) times daily.   sodium phosphate enema Place 1 enema rectally every three (3) days as needed. If constipation not relieved by milk of magnesia or bisacodyl suppository. Follow package directions   sorbitol 70 % solution Take 30 mLs by mouth every 2 (two) hours as needed. Constipation. Give PO Q 2 hours until pt has large BM. Give first dose now along with Bisacodyl  suppository   tamsulosin 0.4 MG Caps capsule Commonly known as:  FLOMAX Take 0.4 mg by mouth daily after supper. 30 mins after supper   vitamin B-12 500 MCG tablet Commonly known as:  CYANOCOBALAMIN Take 500 mcg by mouth daily.   Zinc Oxide 10 % Oint Apply liberal amount topically to areas of skin irritation as needed. Ok to leave at bedside.       Review of Systems  Constitutional: Negative for activity change, appetite change, chills, diaphoresis and fever.  HENT: Negative for congestion, mouth sores, nosebleeds, postnasal drip, sneezing, sore throat, trouble swallowing and voice change.   Respiratory: Negative for apnea, cough, choking, chest tightness, shortness of breath and wheezing.   Cardiovascular: Negative for chest pain, palpitations and leg swelling.  Gastrointestinal: Negative for abdominal distention, abdominal pain, constipation, diarrhea and nausea.  Genitourinary: Negative for difficulty urinating, dysuria, frequency and urgency.  Musculoskeletal: Positive for arthralgias (typical arthritis) and gait problem. Negative for back pain and myalgias.  Skin: Negative for color change, pallor, rash and wound.  Neurological: Positive for tremors and weakness. Negative for dizziness, syncope, speech difficulty, numbness and headaches.  Psychiatric/Behavioral: Negative for agitation and behavioral problems.  All other systems reviewed and are negative.   Immunization History  Administered Date(s) Administered  . Influenza-Unspecified 10/20/2014, 09/10/2016, 09/02/2017  . PPD Test 12/05/2015, 11/26/2016  . Pneumococcal Polysaccharide-23 03/13/2014   Pertinent  Health Maintenance Due  Topic Date Due  . PNA vac Low Risk Adult (2 of 2 - PCV13) 03/14/2015  . INFLUENZA VACCINE  07/12/2018   No flowsheet data found. Functional Status Survey:    Vitals:   04/26/18 0937  BP: (!) 166/73  Pulse: 68  Resp: 16  Temp: (!) 97.5 F (36.4 C)  TempSrc: Oral  SpO2: 98%    Weight: 195 lb 12.8 oz (88.8 kg)  Height:  (1.778 m)   Body mass index is 28.09 kg/m. Physical Exam  Constitutional: He is oriented to person, place, and time. Vital signs are normal. He appears well-developed and well-nourished. He is active and cooperative. He does not appear ill. No distress.  HENT:  Head: Normocephalic and atraumatic.  Mouth/Throat: Uvula is midline, oropharynx is clear and moist and mucous membranes are normal. Mucous membranes are not pale, not dry and not cyanotic.  Eyes: Pupils are equal, round, and reactive to light. Conjunctivae, EOM and lids are normal.  Neck: Trachea normal, normal range of motion and full passive range of motion without pain. Neck supple. No JVD present. No tracheal deviation, no edema and no erythema present. No thyromegaly present.  Cardiovascular: Normal rate, regular rhythm, normal heart sounds, intact distal pulses and normal pulses. Exam reveals no gallop, no distant heart sounds and no friction rub.  No murmur heard. Pulses:      Dorsalis pedis pulses are 2+ on the right side, and 2+ on the left side.  No edema Pacer   Pulmonary/Chest: Effort normal and breath sounds normal. No accessory muscle usage. No respiratory distress. He has no decreased breath sounds. He has no wheezes. He has no rhonchi. He has no rales. He exhibits no tenderness.  Abdominal: Soft. Normal appearance and bowel sounds are normal. He exhibits no distension and no ascites. There is no tenderness.  Musculoskeletal: Normal range of motion. He exhibits no edema or tenderness.  Expected osteoarthritis, stiffness; Bilateral Calves soft, supple. Negative Homan's Sign. B- pedal pulses equal; generalized weakness, mobile in wheelchair  Neurological: He is alert and oriented to person, place, and time. He has normal strength.  Skin: Skin is warm, dry and intact. He is not diaphoretic. No cyanosis. No pallor. Nails show no clubbing.  Psychiatric: His speech is normal.  Judgment and thought content normal. His affect is blunt. He is slowed and withdrawn. Cognition and memory are normal.  Nursing note and vitals reviewed.   Labs reviewed: Recent Labs    10/27/17 1110 11/01/17 1335 03/12/18 1454  NA 132* 138 138  K 3.8 3.6 3.9  CL 98* 106 104  CO2 GLUCOSE 113* 125* 115*  BUN 47* 10 18  CREATININE 2.69* 0.95 0.92  CALCIUM 8.5* 8.4* 9.1   Recent Labs    09/11/17 1506 11/01/17 1335 03/12/18 1454  AST ALT 6* 6* 7*  ALKPHOS 63 63 63  BILITOT 0.6 0.7 0.9  PROT 6.7 6.1* 6.6  ALBUMIN 4.2 3.2* 4.2   Recent Labs    10/27/17 1110 11/01/17 1335 03/12/18 1454  WBC 13.5* 6.2 5.5  NEUTROABS 11.5* 3.9 3.0  HGB 11.8* 12.6* 13.1  HCT 36.5* 37.6* 38.6*  MCV 99.9 99.4 99.4  PLT 160 193 137*   Lab Results  Component Value Date   TSH 0.892 05/18/2016   No results found for: HGBA1C No results found for: CHOL, HDL, LDLCALC, LDLDIRECT, TRIG, CHOLHDL  Significant Diagnostic Results in last 30 days:  No results found.  Assessment/Plan Semaj was seen today for medical management of chronic issues.  Diagnoses and all orders for this visit:  Nocturia more than twice per night  Major depression in remission (HCC)  Pain of left hip joint   Above listed conditions stable  Continue current medication regimen  Monitor for worsening depression  Monitor for worsening pain  Continue to encourage participation in activities and interaction with other residents  Safety precautions  Fall precautions  Continue to be followed by Palliative Care  Family/ staff Communication:   Total Time:  Documentation:  Face to Face:  Family/Phone:   Labs/tests ordered:  No due, Recent labs reviewed- stable  Medication list reviewed and assessed for continued appropriateness. Monthly medication orders reviewed and signed.  Brynda Rim, NP-C [facility] [id number], [place] [address] Cell Phone (Mon-Fri 8am-5pm):  [phone] On Call:  [phone] & follow prompts after 5pm & weekends Office Phone:  [phone] Office Fax:  [phone]

## 2018-05-01 NOTE — Assessment & Plan Note (Signed)
Stable. Pt no longer complains of constant pain. Symptoms controlled with Neurontin 100 mg po Q Day, Norco 5/325 mg Q 6 hours scheduled and Q 4 hours prn

## 2018-05-03 ENCOUNTER — Other Ambulatory Visit
Admission: RE | Admit: 2018-05-03 | Discharge: 2018-05-03 | Disposition: A | Discharge status: Home / Self Care | Payer: Medicare Other | Source: Ambulatory Visit | Attending: Internal Medicine | Admitting: Internal Medicine

## 2018-05-03 DIAGNOSIS — R410 Disorientation, unspecified: Secondary | ICD-10-CM | POA: Insufficient documentation

## 2018-05-03 LAB — URINALYSIS, COMPLETE (UACMP) WITH MICROSCOPIC
Bacteria, UA: NONE SEEN
Bilirubin Urine: NEGATIVE
Glucose, UA: NEGATIVE mg/dL
Hgb urine dipstick: NEGATIVE
KETONES UR: NEGATIVE mg/dL
LEUKOCYTES UA: NEGATIVE
Nitrite: NEGATIVE
PROTEIN: NEGATIVE mg/dL
SQUAMOUS EPITHELIAL / LPF: NONE SEEN (ref 0–5)
Specific Gravity, Urine: 1.005 (ref 1.005–1.030)
pH: 7 (ref 5.0–8.0)

## 2018-05-05 LAB — URINE CULTURE: Culture: NO GROWTH

## 2018-05-12 ENCOUNTER — Encounter
Admission: RE | Admit: 2018-05-12 | Discharge: 2018-05-12 | Disposition: A | Discharge status: Home / Self Care | Payer: Medicare Other | Source: Ambulatory Visit | Attending: Internal Medicine | Admitting: Internal Medicine

## 2018-05-29 ENCOUNTER — Encounter: Payer: Self-pay | Admitting: Gerontology

## 2018-05-29 ENCOUNTER — Non-Acute Institutional Stay (SKILLED_NURSING_FACILITY): Payer: Medicare Other | Admitting: Gerontology

## 2018-05-29 DIAGNOSIS — M48061 Spinal stenosis, lumbar region without neurogenic claudication: Secondary | ICD-10-CM | POA: Diagnosis not present

## 2018-05-29 DIAGNOSIS — E538 Deficiency of other specified B group vitamins: Secondary | ICD-10-CM | POA: Diagnosis not present

## 2018-05-29 DIAGNOSIS — M5416 Radiculopathy, lumbar region: Secondary | ICD-10-CM | POA: Diagnosis not present

## 2018-05-29 DIAGNOSIS — I825Y2 Chronic embolism and thrombosis of unspecified deep veins of left proximal lower extremity: Secondary | ICD-10-CM

## 2018-06-11 ENCOUNTER — Encounter
Admission: RE | Admit: 2018-06-11 | Discharge: 2018-06-11 | Disposition: A | Discharge status: Home / Self Care | Payer: Medicare Other | Source: Ambulatory Visit | Attending: Internal Medicine | Admitting: Internal Medicine

## 2018-06-11 NOTE — Assessment & Plan Note (Signed)
Stable. On Cyanocobalamin 500 mcg po Q Day. Recent level 615.

## 2018-06-11 NOTE — Assessment & Plan Note (Signed)
Stable. Pt reports pain is better as of late. Minimal parasthesias. On Gabapentin 100 mg po Q Day

## 2018-06-11 NOTE — Assessment & Plan Note (Signed)
Stable. On Xarelto 15 mg po Q Day. Tolerating well. No further complaints of leg pain. No hematuria

## 2018-06-11 NOTE — Progress Notes (Signed)
Location:    [facility] Home Room Number: 336P Place of Service:  SNF (31) Provider:  Lorenso QuarryShannon Lorilee Cafarella, NP-C  Lauro RegulusAnderson, Marshall W, MD  Patient Care Team: Lauro RegulusAnderson, Marshall W, MD as PCP - General (Internal Medicine) Lorenso QuarryLeach, Yana Schorr, NP as Nurse Practitioner (Family Medicine)  Extended Emergency Contact Information Primary Emergency Contact: Marva PandaGilliam,Polly  [place] Home Phone: [phone] Mobile Phone: [phone] Relation: Spouse Secondary Emergency Contact: Gwynneth MunsonGilliam,Jeffery  [place] Mobile Phone: [phone] Relation: Son  Code Status:  dnr Goals of care: Advanced Directive information Advanced Directives 05/29/2018  Does Patient Have a Medical Advance Directive? Yes  Type of Advance Directive Out of facility DNR (pink MOST or yellow form)  Does patient want to make changes to medical advance directive? No - Patient declined  Copy of Healthcare Power of Attorney in Chart? -  Would patient like information on creating a medical advance directive? -  Pre-existing out of facility DNR order (yellow form or pink MOST form) Yellow form placed in chart (order not valid for inpatient use)     Chief Complaint  Patient presents with  . Medical Management of Chronic Issues    Routine Visit    HPI:  Pt is a 84 y.o. male seen today for medical management of chronic diseases.    Spinal stenosis of lumbar region with radiculopathy Stable. Pt reports pain is better as of late. Minimal parasthesias. On Gabapentin 100 mg po Q Day  Vitamin B12 deficiency Stable. On Cyanocobalamin 500 mcg po Q Day. Recent level 615.  Chronic deep vein thrombosis (DVT) of proximal vein of lower extremity (HCC) Stable. On Xarelto 15 mg po Q Day. Tolerating well. No further complaints of leg pain. No hematuria    Past Medical History:  Diagnosis Date  . Anxiety   . Arthritis   . Blood transfusion    hx of autologus transfusion  . Cataract cortical, senile   .  Chicken pox   . Clotting disorder (HCC)   . Complete heart block (HCC)   . Depression    unspecified  . DVT (deep venous thrombosis) (HCC) 09/2014   left leg  . GERD (gastroesophageal reflux disease)   . Hereditary and idiopathic neuropathy   . History of depression   . Hypertension   . MCI (mild cognitive impairment) 10/22/2013  . Memory deficit 03/24/2011  . Memory loss   . Movement disorder   . Neuromuscular disorder (HCC)    peripheral  neuropathy  . Neuropathy   . Osteoarthritis   . Other hammer toe(s) (acquired), unspecified foot   . Pacemaker   . Parkinson's disease (HCC)   . Parkinsonism (HCC) 10/22/2013  . Shortness of breath   . Spinal stenosis   . Synovitis and tenosynovitis, unspecified   . Third degree heart block (HCC)   . Tremor    Past Surgical History:  Procedure Laterality Date  . CATARACT EXTRACTION  06/2005  . COLONOSCOPY  06/13/2006   PH colon polyps (MUS) repeat 7/20  . CORRECTION HAMMER TOE    . defibrilator  04/04/2012   Dual Chamber  . INSERT / REPLACE / REMOVE PACEMAKER  04/04/2012  . JOINT REPLACEMENT Right    THA  . JOINT REPLACEMENT Bilateral    TKA  . KNEE ARTHROSCOPY Bilateral    Right x 2 and Left  . KNEE SURGERY  [phone],[id number],1996  . LEAD REVISION N/A 04/05/2012   Procedure: LEAD REVISION;  Surgeon: Duke SalviaSteven C Klein, MD;  [facility];  Service: Cardiovascular;  Laterality: N/A;  . PERMANENT PACEMAKER INSERTION N/A 04/04/2012   Procedure: PERMANENT PACEMAKER INSERTION;  Surgeon: Marinus Maw, MD;  Location: [facility] CATH LAB;  Service: Cardiovascular;  Laterality: N/A;  . REPLACEMENT TOTAL HIP W/  RESURFACING IMPLANTS    . REPLACEMENT TOTAL KNEE BILATERAL  1997  . ROTATOR CUFF REPAIR  06/2008  . ROTATOR CUFF REPAIR Left 2010  . SHOULDER ARTHROSCOPY Left 06/19/2008   WITH SUBACROMIAL DECOMPRESSION. DISTAL CLAVICLE EXCISION. ARTHROSCOPIC ROTATOR CUFF REPAIR.  Marland Kitchen TOTAL HIP ARTHROPLASTY  2009   right  . UMBILICAL HERNIA REPAIR  2001  .  VASECTOMY      Allergies  Allergen Reactions  . Citalopram     Other reaction(s): Unknown  . Codeine Nausea And Vomiting  . Sertraline     Other reaction(s): Unknown Other reaction(s): Unknown  . Lodine [Etodolac] Rash  . Percocet [Oxycodone-Acetaminophen] Rash    Tolerates tylenol Patient is able to take this recently    Allergies as of 05/29/2018      Reactions   Citalopram    Other reaction(s): Unknown   Codeine Nausea And Vomiting   Sertraline    Other reaction(s): Unknown Other reaction(s): Unknown   Lodine [etodolac] Rash   Percocet [oxycodone-acetaminophen] Rash   Tolerates tylenol Patient is able to take this recently      Medication List        Accurate as of 05/29/18 11:59 PM. Always use your most recent med list.          acetaminophen 325 MG tablet Commonly known as:  TYLENOL Take 650 mg by mouth 4 (four) times daily as needed. Max of 3000 mg of Apap in 24 hrs   BISCOLAX 10 MG suppository Generic drug:  bisacodyl Place 10 mg rectally 2 (two) times daily as needed for moderate constipation. Give until BM.   carbidopa-levodopa 25-250 MG tablet Commonly known as:  SINEMET IR Take 1 tablet by mouth 3 (three) times daily.   cetirizine 10 MG tablet Commonly known as:  ZYRTEC Take 10 mg by mouth at bedtime.   desvenlafaxine 50 MG 24 hr tablet Commonly known as:  PRISTIQ Take 50 mg by mouth every other day. at supper DO NOT CRUSH. depression   FLONASE 50 MCG/ACT nasal spray Generic drug:  fluticasone Place 1 spray into both nostrils daily.   gabapentin 100 MG capsule Commonly known as:  NEURONTIN Take 100 mg by mouth daily.   guaiFENesin 100 MG/5ML liquid Commonly known as:  ROBITUSSIN Take 200 mg by mouth every 4 (four) hours as needed for cough. Notify provider if cough worsens/ continues longer than 3 days.   HYDROcodone-acetaminophen 5-325 MG tablet Commonly known as:  NORCO/VICODIN Take 1 tablet by mouth every 4 (four) hours as needed.  breakthrough pain. Maximum dose of 3000 mg of Acetaminophen in 24 hours   HYDROcodone-acetaminophen 5-325 MG tablet Commonly known as:  NORCO/VICODIN Take 1 tablet by mouth every 6 (six) hours. pain. hold for sedation. may give prn doses in addition to scheduled for severe pain. Maximum of 3000 mg of Acetaminophen in 24 hours   ipratropium 0.03 % nasal spray Commonly known as:  ATROVENT Place 2 sprays into both nostrils every 12 (twelve) hours.   ipratropium-albuterol 0.5-2.5 (3) MG/3ML Soln Commonly known as:  DUONEB Take 3 mLs by nebulization every 6 (six) hours as needed (wheezing and shortness of breath with cough).   lisinopril 40 MG tablet Commonly known as:  PRINIVIL,ZESTRIL TAKE ONE TABLET BY  MOUTH EVERY DAY   magnesium hydroxide 400 MG/5ML suspension Commonly known as:  MILK OF MAGNESIA Take 30 mLs by mouth every 4 (four) hours as needed. If constipation/ no BM for 2 days   ondansetron 4 MG tablet Commonly known as:  ZOFRAN Take 4 mg by mouth every 6 (six) hours as needed for nausea or vomiting.   OXYGEN Inhale 2 L into the lungs as needed. Check O2 sat prior to placing on patient and at least every 4 hours after applying. Notify MD if O2 sat drop below baseline while receiving oxygen or no improvement in dyspnea   OXYGEN Inhale 2 L into the lungs at bedtime. For sleep apnea like behavior   pantoprazole 40 MG tablet Commonly known as:  PROTONIX Take 40 mg by mouth daily.   Rivaroxaban 15 MG Tabs tablet Commonly known as:  XARELTO Take 15 mg by mouth daily with supper. for anticoagulation. Monitor for bleeding/bruising.   sennosides-docusate sodium 8.6-50 MG tablet Commonly known as:  SENOKOT-S Take 2 tablets by mouth 2 (two) times daily.   sodium phosphate enema Place 1 enema rectally every three (3) days as needed. If constipation not relieved by milk of magnesia or bisacodyl suppository. Follow package directions   sorbitol 70 % solution Take 30 mLs by mouth  every 2 (two) hours as needed. Constipation. Give PO Q 2 hours until pt has large BM. Give first dose now along with Bisacodyl suppository   tamsulosin 0.4 MG Caps capsule Commonly known as:  FLOMAX Take 0.4 mg by mouth daily after supper. 30 mins after supper   vitamin B-12 500 MCG tablet Commonly known as:  CYANOCOBALAMIN Take 500 mcg by mouth daily.   Zinc Oxide 10 % Oint Apply liberal amount topically to areas of skin irritation as needed. Ok to leave at bedside.       Review of Systems  Constitutional: Negative for activity change, appetite change, chills, diaphoresis and fever.  HENT: Negative for congestion, mouth sores, nosebleeds, postnasal drip, sneezing, sore throat, trouble swallowing and voice change.   Respiratory: Negative for apnea, cough, choking, chest tightness, shortness of breath and wheezing.   Cardiovascular: Negative for chest pain, palpitations and leg swelling.  Gastrointestinal: Negative for abdominal distention, abdominal pain, constipation, diarrhea and nausea.  Genitourinary: Negative for difficulty urinating, dysuria, frequency and urgency.  Musculoskeletal: Positive for arthralgias (typical arthritis), back pain and gait problem. Negative for myalgias.  Skin: Negative for color change, pallor, rash and wound.  Neurological: Positive for weakness. Negative for dizziness, tremors, syncope, speech difficulty, numbness and headaches.  Psychiatric/Behavioral: Negative for agitation and behavioral problems.  All other systems reviewed and are negative.   Immunization History  Administered Date(s) Administered  . Influenza-Unspecified 10/20/2014, 09/10/2016, 09/02/2017  . PPD Test 12/05/2015, 11/26/2016  . Pneumococcal Polysaccharide-23 03/13/2014   Pertinent  Health Maintenance Due  Topic Date Due  . PNA vac Low Risk Adult (2 of 2 - PCV13) 03/14/2015  . INFLUENZA VACCINE  07/12/2018   No flowsheet data found. Functional Status Survey:    Vitals:     05/29/18 1205  BP: (!) 166/73  Pulse: 68  Resp: 16  Temp: (!) 97.5 F (36.4 C)  TempSrc: Oral  SpO2: 96%  Weight: 196 lb 4.8 oz (89 kg)  Height: 5\' 10"  (1.778 m)   Body mass index is 28.17 kg/m. Physical Exam  Constitutional: He is oriented to person, place, and time. Vital signs are normal. He appears well-developed and well-nourished. He is active  and cooperative. He does not appear ill. No distress.  HENT:  Head: Normocephalic and atraumatic.  Mouth/Throat: Uvula is midline, oropharynx is clear and moist and mucous membranes are normal. Mucous membranes are not pale, not dry and not cyanotic.  Eyes: Pupils are equal, round, and reactive to light. Conjunctivae, EOM and lids are normal.  Neck: Trachea normal, normal range of motion and full passive range of motion without pain. Neck supple. No JVD present. No tracheal deviation, no edema and no erythema present. No thyromegaly present.  Cardiovascular: Normal rate, regular rhythm, normal heart sounds, intact distal pulses and normal pulses. Exam reveals no gallop, no distant heart sounds and no friction rub.  No murmur heard. Pulses:      Dorsalis pedis pulses are 2+ on the right side, and 2+ on the left side.  No edema  Pulmonary/Chest: Effort normal and breath sounds normal. No accessory muscle usage. No respiratory distress. He has no decreased breath sounds. He has no wheezes. He has no rhonchi. He has no rales. He exhibits no tenderness.  Abdominal: Soft. Normal appearance and bowel sounds are normal. He exhibits no distension and no ascites. There is no tenderness.  Musculoskeletal: Normal range of motion. He exhibits no edema or tenderness.  Expected osteoarthritis, stiffness; Bilateral Calves soft, supple. Negative Homan's Sign. B- pedal pulses equal; generalized weakness, mobile in wheelchair  Neurological: He is alert and oriented to person, place, and time. He displays tremor. He exhibits abnormal muscle tone. Coordination  and gait abnormal.  Parkinsons  Skin: Skin is warm, dry and intact. He is not diaphoretic. No cyanosis. No pallor. Nails show no clubbing.  Psychiatric: He has a normal mood and affect. His speech is normal and behavior is normal. Judgment and thought content normal. Cognition and memory are impaired. He exhibits abnormal recent memory.  Nursing note and vitals reviewed.   Labs reviewed: Recent Labs    10/27/17 1110 11/01/17 1335 03/12/18 1454  NA 132* 138 138  K 3.8 3.6 3.9  CL 98* 106 104  CO2 22 25 26   GLUCOSE 113* 125* 115*  BUN 47* 10 18  CREATININE 2.69* 0.95 0.92  CALCIUM 8.5* 8.4* 9.1   Recent Labs    09/11/17 1506 11/01/17 1335 03/12/18 1454  AST 15 19 15   ALT 6* 6* 7*  ALKPHOS 63 63 63  BILITOT 0.6 0.7 0.9  PROT 6.7 6.1* 6.6  ALBUMIN 4.2 3.2* 4.2   Recent Labs    10/27/17 1110 11/01/17 1335 03/12/18 1454  WBC 13.5* 6.2 5.5  NEUTROABS 11.5* 3.9 3.0  HGB 11.8* 12.6* 13.1  HCT 36.5* 37.6* 38.6*  MCV 99.9 99.4 99.4  PLT 160 193 137*   Lab Results  Component Value Date   TSH 0.892 05/18/2016   No results found for: HGBA1C No results found for: CHOL, HDL, LDLCALC, LDLDIRECT, TRIG, CHOLHDL  Significant Diagnostic Results in last 30 days:  No results found.  Assessment/Plan Delmus was seen today for medical management of chronic issues.  Diagnoses and all orders for this visit:  Spinal stenosis of lumbar region with radiculopathy  Vitamin B12 deficiency  Chronic deep vein thrombosis (DVT) of proximal vein of left lower extremity (HCC)   Above listed conditions stable  Continue current medication regimen  Assist with ADLs as appropriate  Continue to encourage pt interaction with other residents and participation in activities  Monitor for worsening pain  Safety precautions   Fall precautions  Pt to continue to be followed by Palliative  Medicine  Family/ staff Communication:   Total Time:  Documentation:  Face to  Face:  Family/Phone:   Labs/tests ordered:  Not due- stable  Medication list reviewed and assessed for continued appropriateness. Monthly medication orders reviewed and signed.  Brynda Rim, NP-C Geriatrics [facility] [phone] [place], [place] [address] Cell Phone (Mon-Fri 8am-5pm):  [phone] On Call:  [phone] & follow prompts after 5pm & weekends Office Phone:  [phone] Office Fax:  [phone]

## 2018-06-22 ENCOUNTER — Encounter: Payer: Self-pay | Admitting: Adult Health

## 2018-06-22 ENCOUNTER — Non-Acute Institutional Stay (SKILLED_NURSING_FACILITY): Payer: Medicare Other | Admitting: Adult Health

## 2018-06-22 DIAGNOSIS — F325 Major depressive disorder, single episode, in full remission: Secondary | ICD-10-CM

## 2018-06-22 DIAGNOSIS — Z86718 Personal history of other venous thrombosis and embolism: Secondary | ICD-10-CM

## 2018-06-22 DIAGNOSIS — K219 Gastro-esophageal reflux disease without esophagitis: Secondary | ICD-10-CM

## 2018-06-22 DIAGNOSIS — I10 Essential (primary) hypertension: Secondary | ICD-10-CM

## 2018-06-22 DIAGNOSIS — G20 Parkinson's disease: Secondary | ICD-10-CM | POA: Diagnosis not present

## 2018-06-22 NOTE — Progress Notes (Signed)
Location:  [facility] Room Number: 336 Place of Service:  SNF [phone]) Provider:  Kenard Gower, NP  Patient Care Team: Lauro Regulus, MD as PCP - General (Internal Medicine)  Extended Emergency Contact Information Primary Emergency Contact: Marva Panda [place] Home Phone: [phone] Mobile Phone: [phone] Relation: Spouse Secondary Emergency Contact: Gwynneth Munson [place] Mobile Phone: [phone] Relation: Son  Code Status:  DNR  Goals of care: Advanced Directive information Advanced Directives 05/29/2018  Does Patient Have a Medical Advance Directive? Yes  Type of Advance Directive Out of facility DNR (pink MOST or yellow form)  Does patient want to make changes to medical advance directive? No - Patient declined  Copy of Healthcare Power of Attorney in Chart? -  Would patient like information on creating a medical advance directive? -  Pre-existing out of facility DNR order (yellow form or pink MOST form) Yellow form placed in chart (order not valid for inpatient use)     Chief Complaint  Patient presents with  . Medical Management of Chronic Issues    The patient is seen for a routine     HPI:  Pt is an 84 y.o. male seen today for medical management of chronic diseases.  He is a long-term care resident of [address] of [place].  He has a PMH of anxiety, arthritis, DVT, hypertension, and depression. He was seen in the room today. He did not verbalize any concerns.   Past Medical History:  Diagnosis Date  . Anxiety   . Arthritis   . Blood transfusion    hx of autologus transfusion  . Cataract cortical, senile   . Chicken pox   . Clotting disorder (HCC)   . Complete heart block (HCC)   . Depression    unspecified  . DVT (deep venous thrombosis) (HCC) 09/2014   left leg  . GERD (gastroesophageal reflux disease)   . Hereditary and idiopathic neuropathy   . History of  depression   . Hypertension   . MCI (mild cognitive impairment) 10/22/2013  . Memory deficit 03/24/2011  . Memory loss   . Movement disorder   . Neuromuscular disorder (HCC)    peripheral  neuropathy  . Neuropathy   . Osteoarthritis   . Other hammer toe(s) (acquired), unspecified foot   . Pacemaker   . Parkinson's disease (HCC)   . Parkinsonism (HCC) 10/22/2013  . Shortness of breath   . Spinal stenosis   . Synovitis and tenosynovitis, unspecified   . Third degree heart block (HCC)   . Tremor    Past Surgical History:  Procedure Laterality Date  . CATARACT EXTRACTION  06/2005  . COLONOSCOPY  06/13/2006   PH colon polyps (MUS) repeat 7/20  . CORRECTION HAMMER TOE    . defibrilator  04/04/2012   Dual Chamber  . INSERT / REPLACE / REMOVE PACEMAKER  04/04/2012  . JOINT REPLACEMENT Right    THA  . JOINT REPLACEMENT Bilateral    TKA  . KNEE ARTHROSCOPY Bilateral    Right x 2 and Left  . KNEE SURGERY  [phone]  . LEAD REVISION N/A 04/05/2012   Procedure: LEAD REVISION;  Surgeon: Duke Salvia, MD;  Location: [facility] CATH LAB;  Service: Cardiovascular;  Laterality: N/A;  . PERMANENT PACEMAKER INSERTION N/A 04/04/2012   Procedure: PERMANENT PACEMAKER INSERTION;  Surgeon: Marinus Maw, MD;  Location: [facility] CATH LAB;  Service: Cardiovascular;  Laterality: N/A;  . REPLACEMENT TOTAL HIP W/  RESURFACING IMPLANTS    . REPLACEMENT TOTAL KNEE BILATERAL  1997  . ROTATOR CUFF REPAIR  06/2008  . ROTATOR CUFF REPAIR Left 2010  . SHOULDER ARTHROSCOPY Left 06/19/2008   WITH SUBACROMIAL DECOMPRESSION. DISTAL CLAVICLE EXCISION. ARTHROSCOPIC ROTATOR CUFF REPAIR.  Marland Kitchen TOTAL HIP ARTHROPLASTY  2009   right  . UMBILICAL HERNIA REPAIR  2001  . VASECTOMY      Allergies  Allergen Reactions  . Citalopram     Other reaction(s): Unknown  . Codeine Nausea And Vomiting  . Sertraline     Other reaction(s): Unknown Other reaction(s): Unknown  . Lodine [Etodolac] Rash  . Percocet  [Oxycodone-Acetaminophen] Rash    Tolerates tylenol Patient is able to take this recently    Outpatient Encounter Medications as of 06/22/2018  Medication Sig  . acetaminophen (TYLENOL) 325 MG tablet Take 650 mg by mouth 4 (four) times daily as needed. Max of 3000 mg of Apap in 24 hrs  . bisacodyl (BISCOLAX) 10 MG suppository Place 10 mg rectally 2 (two) times daily as needed for moderate constipation. Give until BM.  . carbidopa-levodopa (SINEMET IR) 25-250 MG tablet Take 1 tablet by mouth 3 (three) times daily.  . cetirizine (ZYRTEC) 10 MG tablet Take 10 mg by mouth at bedtime.  . cyanocobalamin 500 MCG tablet Take 500 mcg by mouth daily.  Marland Kitchen desvenlafaxine (PRISTIQ) 50 MG 24 hr tablet Take 50 mg by mouth every other day. at supper DO NOT CRUSH. depression  . fluticasone (FLONASE) 50 MCG/ACT nasal spray Place 1 spray into both nostrils daily.   Marland Kitchen gabapentin (NEURONTIN) 100 MG capsule Take 100 mg by mouth daily.   Marland Kitchen guaiFENesin (ROBITUSSIN) 100 MG/5ML liquid Take 200 mg by mouth every 4 (four) hours as needed for cough. Notify provider if cough worsens/ continues longer than 3 days.  Marland Kitchen HYDROcodone-acetaminophen (NORCO/VICODIN) 5-325 MG tablet Take 1 tablet by mouth every 4 (four) hours as needed. breakthrough pain. Maximum dose of 3000 mg of Acetaminophen in 24 hours  . HYDROcodone-acetaminophen (NORCO/VICODIN) 5-325 MG tablet Take 1 tablet by mouth every 6 (six) hours. pain. hold for sedation. may give prn doses in addition to scheduled for severe pain. Maximum of 3000 mg of Acetaminophen in 24 hours  . ipratropium (ATROVENT) 0.03 % nasal spray Place 2 sprays into both nostrils every 12 (twelve) hours.  Marland Kitchen ipratropium-albuterol (DUONEB) 0.5-2.5 (3) MG/3ML SOLN Take 3 mLs by nebulization every 6 (six) hours as needed (wheezing and shortness of breath with cough).  Marland Kitchen lisinopril (PRINIVIL,ZESTRIL) 40 MG tablet TAKE ONE TABLET BY MOUTH EVERY DAY  . magnesium hydroxide (MILK OF MAGNESIA) 400 MG/5ML  suspension Take 30 mLs by mouth every 4 (four) hours as needed. If constipation/ no BM for 2 days  . ondansetron (ZOFRAN) 4 MG tablet Take 4 mg by mouth every 6 (six) hours as needed for nausea or vomiting.  . OXYGEN Inhale 2 L into the lungs as needed. Check O2 sat prior to placing on patient and at least every 4 hours after applying. Notify MD if O2 sat drop below baseline while receiving oxygen or no improvement in dyspnea  . OXYGEN Inhale 2 L into the lungs at bedtime. For sleep apnea like behavior  . pantoprazole (PROTONIX) 40 MG tablet Take 40 mg by mouth daily.  . Rivaroxaban (XARELTO) 15 MG TABS tablet Take 15 mg by mouth daily with supper. for anticoagulation. Monitor for bleeding/bruising.  . sennosides-docusate sodium (SENOKOT-S) 8.6-50 MG tablet Take 2 tablets by mouth 2 (two)  times daily.   . sodium phosphate (FLEET) enema Place 1 enema rectally every three (3) days as needed. If constipation not relieved by milk of magnesia or bisacodyl suppository. Follow package directions  . sorbitol 70 % solution Take 30 mLs by mouth every 2 (two) hours as needed. Constipation. Give PO Q 2 hours until pt has large BM. Give first dose now along with Bisacodyl suppository  . tamsulosin (FLOMAX) 0.4 MG CAPS capsule Take 0.4 mg by mouth daily after supper. 30 mins after supper  . Zinc Oxide 10 % OINT Apply liberal amount topically to areas of skin irritation as needed. Ok to leave at bedside.   No facility-administered encounter medications on file as of 06/22/2018.     Review of Systems  GENERAL: No change in appetite, no fatigue, no weight changes, no fever, chills or weakness MOUTH and THROAT: Denies oral discomfort, gingival pain or bleeding, pain from teeth or hoarseness   RESPIRATORY: no cough, SOB, DOE, wheezing, hemoptysis CARDIAC: No chest pain, edema or palpitations GI: No abdominal pain, diarrhea, constipation, heart burn, nausea or vomiting PSYCHIATRIC: Denies feelings of depression or  anxiety. No report of hallucinations, insomnia, paranoia, or agitation   Immunization History  Administered Date(s) Administered  . Influenza-Unspecified 10/20/2014, 09/10/2016, 09/02/2017  . PPD Test 12/05/2015, 11/26/2016  . Pneumococcal Polysaccharide-23 03/13/2014   Pertinent  Health Maintenance Due  Topic Date Due  . PNA vac Low Risk Adult (2 of 2 - PCV13) 03/14/2015  . INFLUENZA VACCINE  07/12/2018     Vitals:   06/22/18 1017  Weight: 196 lb 4.8 oz (89 kg)  Height: 5\' 10"  (1.778 m)   Body mass index is 28.17 kg/m.  Physical Exam  GENERAL APPEARANCE: Well nourished. In no acute distress. Normal body habitus SKIN:  Skin is warm and dry.  MOUTH and THROAT: Lips are without lesions. Oral mucosa is moist and without lesions. Tongue is normal in shape, size, and color and without lesions RESPIRATORY: Breathing is even & unlabored, BS CTAB CARDIAC: RRR, no murmur,no extra heart sounds, no edema, left chest pacemaker GI: Abdomen soft, normal BS, no masses, no tenderness EXTREMITIES:  No edema, no cyanosis PSYCHIATRIC: Alert to self and place, disoriented to time. Affect and behavior are appropriate   Labs reviewed: Recent Labs    10/27/17 1110 11/01/17 1335 03/12/18 1454  NA 132* 138 138  K 3.8 3.6 3.9  CL 98* 106 104  CO2 22 25 26   GLUCOSE 113* 125* 115*  BUN 47* 10 18  CREATININE 2.69* 0.95 0.92  CALCIUM 8.5* 8.4* 9.1   Recent Labs    09/11/17 1506 11/01/17 1335 03/12/18 1454  AST 15 19 15   ALT 6* 6* 7*  ALKPHOS 63 63 63  BILITOT 0.6 0.7 0.9  PROT 6.7 6.1* 6.6  ALBUMIN 4.2 3.2* 4.2   Recent Labs    10/27/17 1110 11/01/17 1335 03/12/18 1454  WBC 13.5* 6.2 5.5  NEUTROABS 11.5* 3.9 3.0  HGB 11.8* 12.6* 13.1  HCT 36.5* 37.6* 38.6*  MCV 99.9 99.4 99.4  PLT 160 193 137*   Lab Results  Component Value Date   TSH 0.892 05/18/2016    Assessment/Plan  1. Parkinson's disease (HCC) - stable, continue Carbidopa-levodopa 25-250 mg 1 tab TID   2.  History of DVT (deep vein thrombosis) - continue Xarelto 15 mg daily   3. Essential hypertension - well-controlled, continue Lisinopril 40 mg 1 tab daily   4. Gastroesophageal reflux disease without esophagitis - stable, continue  Protonix 40 mg daily   5. Major depression in remission (HCC) - mood is stable, continue Pristiq ER 24 hour 50 mg 1 tab daily      Family/ staff Communication: Discussed plan of care with resident.  Labs/tests ordered:  None  Goals of care:   Long-term care.   Kenard GowerMonina Medina-Vargas, NP [facility] [phone] (Monday-Friday 8:00 a.m. - 5:00 p.m.) [phone] (after hours)

## 2018-07-10 ENCOUNTER — Other Ambulatory Visit: Payer: Self-pay

## 2018-07-10 MED ORDER — GABAPENTIN 100 MG PO CAPS: 100.0000 mg | ORAL_CAPSULE | Freq: Two times a day (BID) | ORAL | 0 refills | Status: DC

## 2018-07-10 NOTE — Telephone Encounter (Signed)
Rx sent to [facility] phone : [phone] , fax : [phone]  

## 2018-07-12 ENCOUNTER — Encounter
Admission: RE | Admit: 2018-07-12 | Discharge: 2018-07-12 | Disposition: A | Discharge status: Home / Self Care | Payer: Medicare Other | Source: Ambulatory Visit | Attending: Internal Medicine | Admitting: Internal Medicine

## 2018-07-24 ENCOUNTER — Other Ambulatory Visit: Payer: Self-pay

## 2018-07-24 MED ORDER — HYDROCODONE-ACETAMINOPHEN 5-325 MG PO TABS: 1.0000 | ORAL_TABLET | Freq: Four times a day (QID) | ORAL | 0 refills | Status: DC | PRN

## 2018-07-24 MED ORDER — HYDROCODONE-ACETAMINOPHEN 5-325 MG PO TABS: 1.0000 | ORAL_TABLET | Freq: Three times a day (TID) | ORAL | 0 refills | Status: DC

## 2018-07-24 NOTE — Telephone Encounter (Signed)
Rx sent to [facility] phone : [phone] , fax : [phone]  

## 2018-07-26 ENCOUNTER — Telehealth: Payer: Self-pay | Admitting: Cardiology

## 2018-07-26 ENCOUNTER — Ambulatory Visit (INDEPENDENT_AMBULATORY_CARE_PROVIDER_SITE_OTHER): Payer: Medicare Other | Admitting: *Deleted

## 2018-07-26 DIAGNOSIS — I442 Atrioventricular block, complete: Secondary | ICD-10-CM

## 2018-07-26 NOTE — Telephone Encounter (Signed)
Confirmed remote transmission w/ pt wife.   

## 2018-07-27 NOTE — Progress Notes (Signed)
Remote pacemaker transmission.   

## 2018-08-06 ENCOUNTER — Encounter: Payer: Self-pay | Admitting: Adult Health

## 2018-08-06 ENCOUNTER — Non-Acute Institutional Stay (SKILLED_NURSING_FACILITY): Payer: Medicare Other | Admitting: Adult Health

## 2018-08-06 DIAGNOSIS — K219 Gastro-esophageal reflux disease without esophagitis: Secondary | ICD-10-CM

## 2018-08-06 DIAGNOSIS — I10 Essential (primary) hypertension: Secondary | ICD-10-CM

## 2018-08-06 DIAGNOSIS — G20 Parkinson's disease: Secondary | ICD-10-CM

## 2018-08-06 DIAGNOSIS — Z950 Presence of cardiac pacemaker: Secondary | ICD-10-CM

## 2018-08-06 DIAGNOSIS — I442 Atrioventricular block, complete: Secondary | ICD-10-CM | POA: Diagnosis not present

## 2018-08-06 DIAGNOSIS — R351 Nocturia: Secondary | ICD-10-CM

## 2018-08-06 DIAGNOSIS — M159 Polyosteoarthritis, unspecified: Secondary | ICD-10-CM

## 2018-08-06 DIAGNOSIS — J309 Allergic rhinitis, unspecified: Secondary | ICD-10-CM | POA: Diagnosis not present

## 2018-08-06 DIAGNOSIS — I825Y2 Chronic embolism and thrombosis of unspecified deep veins of left proximal lower extremity: Secondary | ICD-10-CM | POA: Diagnosis not present

## 2018-08-06 DIAGNOSIS — K5909 Other constipation: Secondary | ICD-10-CM

## 2018-08-06 NOTE — Progress Notes (Signed)
Location:   [facility] Room Number: [phone] Place of Service:  SNF (31)   CODE STATUS: DNR  Allergies  Allergen Reactions  . Citalopram     Other reaction(s): Unknown  . Codeine Nausea And Vomiting  . Sertraline     Other reaction(s): Unknown Other reaction(s): Unknown  . Lodine [Etodolac] Rash  . Percocet [Oxycodone-Acetaminophen] Rash    Tolerates tylenol Patient is able to take this recently    Chief Complaint  Patient presents with  . Medical Management of Chronic Issues    Dvt; complete heart block; hypertension.     HPI:  He is a long term resident of this facility being seen for the management of his chronic illnesses: dvt; complete heart block and hypertension. He does complain of urinary frequency. He denies any chest pain of shortness of breath. There are no nursing concerns at this time.   Past Medical History:  Diagnosis Date  . Anxiety   . Arthritis   . Blood transfusion    hx of autologus transfusion  . Cataract cortical, senile   . Chicken pox   . Clotting disorder (HCC)   . Complete heart block (HCC)   . Depression    unspecified  . DVT (deep venous thrombosis) (HCC) 09/2014   left leg  . GERD (gastroesophageal reflux disease)   . Hereditary and idiopathic neuropathy   . History of depression   . Hypertension   . MCI (mild cognitive impairment) 10/22/2013  . Memory deficit 03/24/2011  . Memory loss   . Movement disorder   . Neuromuscular disorder (HCC)    peripheral  neuropathy  . Neuropathy   . Osteoarthritis   . Other hammer toe(s) (acquired), unspecified foot   . Pacemaker   . Parkinson's disease (HCC)   . Parkinsonism (HCC) 10/22/2013  . Shortness of breath   . Spinal stenosis   . Synovitis and tenosynovitis, unspecified   . Third degree heart block (HCC)   . Tremor     Past Surgical History:  Procedure Laterality Date  . CATARACT EXTRACTION  06/2005  . COLONOSCOPY  06/13/2006   PH colon polyps (MUS)  repeat 7/20  . CORRECTION HAMMER TOE    . defibrilator  04/04/2012   Dual Chamber  . INSERT / REPLACE / REMOVE PACEMAKER  04/04/2012  . JOINT REPLACEMENT Right    THA  . JOINT REPLACEMENT Bilateral    TKA  . KNEE ARTHROSCOPY Bilateral    Right x 2 and Left  . KNEE SURGERY  [phone]  . LEAD REVISION N/A 04/05/2012   Procedure: LEAD REVISION;  Surgeon: Duke Salvia, MD;  Location: [facility] CATH LAB;  Service: Cardiovascular;  Laterality: N/A;  . PERMANENT PACEMAKER INSERTION N/A 04/04/2012   Procedure: PERMANENT PACEMAKER INSERTION;  Surgeon: Marinus Maw, MD;  Location: [facility] CATH LAB;  Service: Cardiovascular;  Laterality: N/A;  . REPLACEMENT TOTAL HIP W/  RESURFACING IMPLANTS    . REPLACEMENT TOTAL KNEE BILATERAL  1997  . ROTATOR CUFF REPAIR  06/2008  . ROTATOR CUFF REPAIR Left 2010  . SHOULDER ARTHROSCOPY Left 06/19/2008   WITH SUBACROMIAL DECOMPRESSION. DISTAL CLAVICLE EXCISION. ARTHROSCOPIC ROTATOR CUFF REPAIR.  Marland Kitchen TOTAL HIP ARTHROPLASTY  2009   right  . UMBILICAL HERNIA REPAIR  2001  . VASECTOMY      Social History   Socioeconomic History  . Marital status: Married    Spouse name: Glena Norfolk  . Number of children: 3  . Years of education:  College  . Highest education level: Not on file  Occupational History  . Occupation: Retired  Engineer, production  . Financial resource strain: Not on file  . Food insecurity:    Worry: Not on file    Inability: Not on file  . Transportation needs:    Medical: Not on file    Non-medical: Not on file  Tobacco Use  . Smoking status: Former Smoker    Packs/day: 1.00    Years: 25.00    Pack years: 25.00    Types: Cigarettes    Last attempt to quit: 06/24/1973    Years since quitting: 45.1  . Smokeless tobacco: Never Used  Substance and Sexual Activity  . Alcohol use: Yes    Comment: occasional  . Drug use: No  . Sexual activity: Not Currently  Lifestyle  . Physical activity:    Days per week: Not on file    Minutes per session: Not on  file  . Stress: Not on file  Relationships  . Social connections:    Talks on phone: Not on file    Gets together: Not on file    Attends religious service: Not on file    Active member of club or organization: Not on file    Attends meetings of clubs or organizations: Not on file    Relationship status: Not on file  . Intimate partner violence:    Fear of current or ex partner: Not on file    Emotionally abused: Not on file    Physically abused: Not on file    Forced sexual activity: Not on file  Other Topics Concern  . Not on file  Social History Narrative   Patient lives at home with spouse.   Caffeine Use: 2 cups of coffee daily      Admitted to [facility] 11/18/2015   Married    3 children   Former smoker   Occasional drinker   DNR   Family History  Problem Relation Age of Onset  . Hypertension Mother   . Heart disease Mother   . Hypertension Father   . Seizures Father        epilepsy  . Heart disease Father   . Prostate cancer Other   . Cancer Other   . Hypertension Other   . Asthma Other   . Hypertension Brother   . Breast cancer Daughter       VITAL SIGNS BP (!) 159/79   Pulse 60   Temp 97.6 F (36.4 C) (Oral)   Resp 18   Ht 5\' 10"  (1.778 m)   Wt 193 lb 12.8 oz (87.9 kg)   SpO2 99%   BMI 27.81 kg/m   Outpatient Encounter Medications as of 08/06/2018  Medication Sig  . acetaminophen (TYLENOL) 325 MG tablet Take 650 mg by mouth 4 (four) times daily as needed. Max of 3000 mg of Apap in 24 hrs  . bisacodyl (BISCOLAX) 10 MG suppository Place 10 mg rectally 2 (two) times daily as needed for moderate constipation. Give until BM.  . carbidopa-levodopa (SINEMET IR) 25-250 MG tablet Take 1 tablet by mouth 3 (three) times daily.  . cetirizine (ZYRTEC) 10 MG tablet Take 10 mg by mouth at bedtime.  . cyanocobalamin 500 MCG tablet Take 500 mcg by mouth daily.  . fluticasone (FLONASE) 50 MCG/ACT nasal spray Place 1 spray into both nostrils daily.   Marland Kitchen  gabapentin (NEURONTIN) 100 MG capsule Take 1 capsule (100 mg total) by mouth  2 (two) times daily.  Marland Kitchen. guaiFENesin (ROBITUSSIN) 100 MG/5ML liquid Take 200 mg by mouth every 4 (four) hours as needed for cough. Notify provider if cough worsens/ continues longer than 3 days.  Marland Kitchen. HYDROcodone-acetaminophen (NORCO/VICODIN) 5-325 MG tablet Take 1 tablet by mouth every 6 (six) hours as needed. breakthrough pain. Maximum dose of 3000 mg of Acetaminophen in 24 hours  . HYDROcodone-acetaminophen (NORCO/VICODIN) 5-325 MG tablet Take 1 tablet by mouth every 8 (eight) hours. pain. hold for sedation. may give prn doses in addition to scheduled for severe pain. Maximum of 3000 mg of Acetaminophen in 24 hours  . ipratropium (ATROVENT) 0.03 % nasal spray Place 2 sprays into both nostrils every 12 (twelve) hours.  Marland Kitchen. ipratropium-albuterol (DUONEB) 0.5-2.5 (3) MG/3ML SOLN Take 3 mLs by nebulization every 6 (six) hours as needed (wheezing and shortness of breath with cough).  Marland Kitchen. lisinopril (PRINIVIL,ZESTRIL) 40 MG tablet TAKE ONE TABLET BY MOUTH EVERY DAY  . magnesium hydroxide (MILK OF MAGNESIA) 400 MG/5ML suspension Take 30 mLs by mouth every 4 (four) hours as needed. If constipation/ no BM for 2 days  . NON FORMULARY Diet Type: Regular diet  . ondansetron (ZOFRAN) 4 MG tablet Take 4 mg by mouth every 6 (six) hours as needed for nausea or vomiting.  . OXYGEN Inhale 2 L into the lungs as needed. Check O2 sat prior to placing on patient and at least every 4 hours after applying. Notify MD if O2 sat drop below baseline while receiving oxygen or no improvement in dyspnea  . OXYGEN Inhale 2 L into the lungs at bedtime. For sleep apnea like behavior  . pantoprazole (PROTONIX) 40 MG tablet Take 40 mg by mouth daily.  . Rivaroxaban (XARELTO) 15 MG TABS tablet Take 15 mg by mouth daily with supper. for anticoagulation. Monitor for bleeding/bruising.  . sennosides-docusate sodium (SENOKOT-S) 8.6-50 MG tablet Take 2 tablets by mouth 2  (two) times daily.   . sodium phosphate (FLEET) enema Place 1 enema rectally every three (3) days as needed. If constipation not relieved by milk of magnesia or bisacodyl suppository. Follow package directions  . sorbitol 70 % solution Take 30 mLs by mouth every 2 (two) hours as needed. Constipation. Give PO Q 2 hours until pt has large BM. Give first dose now along with Bisacodyl suppository  . tamsulosin (FLOMAX) 0.4 MG CAPS capsule Take 0.4 mg by mouth daily after supper. 30 mins after supper  . Zinc Oxide 10 % OINT Apply liberal amount topically to areas of skin irritation as needed. Ok to leave at bedside.  . [DISCONTINUED] desvenlafaxine (PRISTIQ) 50 MG 24 hr tablet Take 50 mg by mouth every other day. at supper DO NOT CRUSH. depression   No facility-administered encounter medications on file as of 08/06/2018.      SIGNIFICANT DIAGNOSTIC EXAMS  LABS REVIEWED TODAY:   03-12-18: wbc 5.5; hgb 13.1; hct 38.6; mcv 99.4; plt 137; glucose 115; bun 18; creat 0.92; k+ 3.9; na++ 138; ca 9.1; liver normal albumin 4.2; vit B 12: 615;  05-03-18: urine culture: no growth   Review of Systems  Constitutional: Negative for malaise/fatigue.  Respiratory: Negative for cough and shortness of breath.   Cardiovascular: Negative for chest pain, palpitations and leg swelling.  Gastrointestinal: Negative for abdominal pain, constipation and heartburn.  Genitourinary: Positive for frequency.  Musculoskeletal: Negative for back pain, joint pain and myalgias.  Skin: Negative.   Neurological: Negative for dizziness.  Psychiatric/Behavioral: The patient is not nervous/anxious.  Physical Exam  Constitutional: He is oriented to person, place, and time. He appears well-developed and well-nourished. No distress.  Overweight   Neck: No thyromegaly present.  Cardiovascular: Normal rate, regular rhythm, normal heart sounds and intact distal pulses.  Pulmonary/Chest: Effort normal and breath sounds normal. No  respiratory distress.  Abdominal: Soft. Bowel sounds are normal. He exhibits no distension. There is no tenderness.  Musculoskeletal: He exhibits edema.  Is able to move all extremities Has trace bilateral lower extremity edema   Lymphadenopathy:    He has no cervical adenopathy.  Neurological: He is alert and oriented to person, place, and time.  Skin: Skin is warm and dry. He is not diaphoretic.  Psychiatric: He has a normal mood and affect.     ASSESSMENT/ PLAN:  TODAY:   1. Chronic deep vein thrombosis (DVT) of proximal vein of left lower extremity: is stable is on long term xarelto 15 mg daily   2. Complete heart block: is stable is status post pace maker insertion  3. Essential hypertension: is stable 159/79: will continue lisinopril 40 mg daily   4. Chronic allergic rhinitis: is stable will continue zyrtec 10 mg daily flonase daily; atrovent nasal spray twice daily and has duoneb every 6 hours as needed  5. Chronic constipation: is stable will continue senna s 2 tabs twice daily   6. GERD without esophagitis: is stable will continue protonix 40 mg daily  7. Parkinson's disease: is stable will continue sinemet 25/250 mg three times daily   8. Osteoarthritis involving multiple joints of both sides of body has spinal stenosis of lumbar region with radiculopathy:  is stable will continue neurontin 100 mg twice daily  vicodin 5/325 mg every 8 hours and every 6 hours as needed  9. Nocturia moe than twice per night: is stable will conitnue flomax 0.4 mg daily      MD is aware of resident's narcotic use and is in agreement with current plan of care. We will attempt to wean resident as apropriate   Synthia Innocent NP [facility]  Contact [phone] Monday through Friday 8am- 5pm  After hours call [phone]

## 2018-08-12 ENCOUNTER — Encounter
Admission: RE | Admit: 2018-08-12 | Discharge: 2018-08-12 | Disposition: A | Discharge status: Home / Self Care | Payer: Medicare Other | Source: Ambulatory Visit | Attending: Internal Medicine | Admitting: Internal Medicine

## 2018-08-24 ENCOUNTER — Other Ambulatory Visit
Admission: RE | Admit: 2018-08-24 | Discharge: 2018-08-24 | Disposition: A | Discharge status: Home / Self Care | Payer: Medicare Other | Source: Ambulatory Visit | Attending: Internal Medicine | Admitting: Internal Medicine

## 2018-08-24 DIAGNOSIS — R319 Hematuria, unspecified: Secondary | ICD-10-CM | POA: Diagnosis present

## 2018-08-24 LAB — URINALYSIS, COMPLETE (UACMP) WITH MICROSCOPIC
Bacteria, UA: NONE SEEN
Bilirubin Urine: NEGATIVE
Glucose, UA: NEGATIVE mg/dL
Hgb urine dipstick: NEGATIVE
Ketones, ur: NEGATIVE mg/dL
Leukocytes, UA: NEGATIVE
Nitrite: NEGATIVE
PH: 6 (ref 5.0–8.0)
Protein, ur: NEGATIVE mg/dL
SPECIFIC GRAVITY, URINE: 1.013 (ref 1.005–1.030)
SQUAMOUS EPITHELIAL / LPF: NONE SEEN (ref 0–5)

## 2018-08-24 LAB — CBC WITH DIFFERENTIAL/PLATELET
BAND NEUTROPHILS: 0 %
BASOS PCT: 0 %
Basophils Absolute: 0 10*3/uL (ref 0–0.1)
Blasts: 0 %
EOS PCT: 6 %
Eosinophils Absolute: 0.3 10*3/uL (ref 0–0.7)
HCT: 40 % (ref 40.0–52.0)
Hemoglobin: 13.4 g/dL (ref 13.0–18.0)
LYMPHS ABS: 2.6 10*3/uL (ref 1.0–3.6)
LYMPHS PCT: 45 %
MCH: 34.5 pg — AB (ref 26.0–34.0)
MCHC: 33.5 g/dL (ref 32.0–36.0)
MCV: 103 fL — AB (ref 80.0–100.0)
Metamyelocytes Relative: 0 %
Monocytes Absolute: 0.2 10*3/uL (ref 0.2–1.0)
Monocytes Relative: 3 %
Myelocytes: 0 %
NEUTROS PCT: 46 %
NRBC: 0 /100{WBCs}
Neutro Abs: 2.6 10*3/uL (ref 1.4–6.5)
OTHER: 0 %
PLATELETS: 122 10*3/uL — AB (ref 150–440)
Promyelocytes Relative: 0 %
RBC: 3.89 MIL/uL — ABNORMAL LOW (ref 4.40–5.90)
RDW: 14.6 % — AB (ref 11.5–14.5)
WBC: 5.7 10*3/uL (ref 3.8–10.6)

## 2018-08-26 LAB — URINE CULTURE: CULTURE: NO GROWTH

## 2018-08-31 ENCOUNTER — Non-Acute Institutional Stay (SKILLED_NURSING_FACILITY): Payer: Medicare Other | Admitting: Adult Health

## 2018-08-31 ENCOUNTER — Encounter: Payer: Self-pay | Admitting: Adult Health

## 2018-08-31 DIAGNOSIS — G20 Parkinson's disease: Secondary | ICD-10-CM | POA: Diagnosis not present

## 2018-08-31 DIAGNOSIS — Z86718 Personal history of other venous thrombosis and embolism: Secondary | ICD-10-CM | POA: Diagnosis not present

## 2018-08-31 DIAGNOSIS — G8929 Other chronic pain: Secondary | ICD-10-CM

## 2018-08-31 DIAGNOSIS — J309 Allergic rhinitis, unspecified: Secondary | ICD-10-CM | POA: Diagnosis not present

## 2018-08-31 DIAGNOSIS — F325 Major depressive disorder, single episode, in full remission: Secondary | ICD-10-CM

## 2018-08-31 DIAGNOSIS — N400 Enlarged prostate without lower urinary tract symptoms: Secondary | ICD-10-CM

## 2018-08-31 NOTE — Progress Notes (Signed)
[facility]:  [facility] Room Number: 336P Place of Service:  SNF [phone]) Provider:  Kenard Gower, NP  Patient Care Team: Lauro Regulus, MD as PCP - General (Internal Medicine)  Extended Emergency Contact Information Primary Emergency Contact: Marva Panda [place] of [place] Home Phone: [phone] Mobile Phone: [phone] Relation: Spouse Secondary Emergency Contact: Gwynneth Munson [place] of [place] Mobile Phone: [phone] Relation: Son  Code Status:  DNR  Goals of care: Advanced Directive information Advanced Directives 08/31/2018  Does Patient Have a Medical Advance Directive? Yes  Type of Advance Directive Out of facility DNR (pink MOST or yellow form)  Does patient want to make changes to medical advance directive? No - Patient declined  Copy of Healthcare Power of Attorney in Chart? -  Would patient like information on creating a medical advance directive? -  Pre-existing out of facility DNR order (yellow form or pink MOST form) Yellow form placed in chart (order not valid for inpatient use)     Chief Complaint  Patient presents with  . Medical Management of Chronic Issues    Routine Visit    HPI:  Pt is a 84 y.o. male seen today for medical management of chronic diseases. He has PMH anxiety, arthritis, DVT, hypertension, and depression. He is currently being followed by palliative care. He was seen in the room today sitting in his new recliner. He was pleasant. He verbalized that he has bilateral hip pain, 4/10. He is currently on Norco 5-325 mg Q 8hours for pain. It was reported that he has some "behaviors".  A recent urine culture revealed no growth and wbc 5.7 (08/24/18). No reported fever.    Past Medical History:  Diagnosis Date  . Anxiety   . Arthritis   . Blood transfusion    hx of autologus transfusion  . Cataract cortical, senile   . Chicken pox   . Clotting disorder (HCC)   . Complete  heart block (HCC)   . Depression    unspecified  . DVT (deep venous thrombosis) (HCC) 09/2014   left leg  . GERD (gastroesophageal reflux disease)   . Hereditary and idiopathic neuropathy   . History of depression   . Hypertension   . MCI (mild cognitive impairment) 10/22/2013  . Memory deficit 03/24/2011  . Memory loss   . Movement disorder   . Neuromuscular disorder (HCC)    peripheral  neuropathy  . Neuropathy   . Osteoarthritis   . Other hammer toe(s) (acquired), unspecified foot   . Pacemaker   . Parkinson's disease (HCC)   . Parkinsonism (HCC) 10/22/2013  . Shortness of breath   . Spinal stenosis   . Synovitis and tenosynovitis, unspecified   . Third degree heart block (HCC)   . Tremor    Past Surgical History:  Procedure Laterality Date  . CATARACT EXTRACTION  06/2005  . COLONOSCOPY  06/13/2006   PH colon polyps (MUS) repeat 7/20  . CORRECTION HAMMER TOE    . defibrilator  04/04/2012   Dual Chamber  . INSERT / REPLACE / REMOVE PACEMAKER  04/04/2012  . JOINT REPLACEMENT Right    THA  . JOINT REPLACEMENT Bilateral    TKA  . KNEE ARTHROSCOPY Bilateral    Right x 2 and Left  . KNEE SURGERY  [phone]  . LEAD REVISION N/A 04/05/2012   Procedure: LEAD REVISION;  Surgeon: Duke Salvia, MD;  Location: [facility] CATH LAB;  Service: Cardiovascular;  Laterality: N/A;  . PERMANENT  PACEMAKER INSERTION N/A 04/04/2012   Procedure: PERMANENT PACEMAKER INSERTION;  Surgeon: Marinus MawGregg W Taylor, MD;  [facility];  Service: Cardiovascular;  Laterality: N/A;  . REPLACEMENT TOTAL HIP W/  RESURFACING IMPLANTS    . REPLACEMENT TOTAL KNEE BILATERAL  1997  . ROTATOR CUFF REPAIR  06/2008  . ROTATOR CUFF REPAIR Left 2010  . SHOULDER ARTHROSCOPY Left 06/19/2008   WITH SUBACROMIAL DECOMPRESSION. DISTAL CLAVICLE EXCISION. ARTHROSCOPIC ROTATOR CUFF REPAIR.  Marland Kitchen. TOTAL HIP ARTHROPLASTY  2009   right  . UMBILICAL HERNIA REPAIR  2001  . VASECTOMY      Allergies  Allergen Reactions  .  Citalopram     Other reaction(s): Unknown  . Codeine Nausea And Vomiting  . Sertraline     Other reaction(s): Unknown Other reaction(s): Unknown  . Lodine [Etodolac] Rash  . Percocet [Oxycodone-Acetaminophen] Rash    Tolerates tylenol Patient is able to take this recently     Review of Systems  GENERAL: No change in appetite, no fatigue, no weight changes, no fever, chills or weakness MOUTH and THROAT: Denies oral discomfort, gingival pain or bleeding, pain from teeth or hoarseness   RESPIRATORY: no cough, SOB, DOE, wheezing, hemoptysis CARDIAC: No chest pain, edema or palpitations GI: No abdominal pain, diarrhea, constipation, heart burn, nausea or vomiting GU: Denies dysuria, frequency, hematuria, incontinence, or discharge PSYCHIATRIC: Denies feelings of depression or anxiety. No report of hallucinations, insomnia, paranoia, or agitation   Immunization History  Administered Date(s) Administered  . Influenza-Unspecified 10/20/2014, 09/10/2016, 09/02/2017  . PPD Test 12/05/2015, 11/26/2016  . Pneumococcal Polysaccharide-23 03/13/2014   Pertinent  Health Maintenance Due  Topic Date Due  . INFLUENZA VACCINE  07/12/2018  . PNA vac Low Risk Adult (2 of 2 - PCV13) 03/16/2019 (Originally 03/14/2015)       Vitals:   08/31/18 1055  BP: 124/62  Pulse: 60  Resp: 16  Temp: 98.4 F (36.9 C)  TempSrc: Oral  SpO2: 98%  Weight: 197 lb 8 oz (89.6 kg)  Height: 5\' 10"  (1.778 m)   Body mass index is 28.34 kg/m.  Physical Exam  GENERAL APPEARANCE: Well nourished. In no acute distress. Normal body habitus SKIN:  Skin is warm and dry.  MOUTH and THROAT: Lips are without lesions. Oral mucosa is moist and without lesions. Tongue is normal in shape, size, and color and without lesions RESPIRATORY: Breathing is even & unlabored, BS CTAB CARDIAC: RRR, no murmur,no extra heart sounds, no edema, left chest pacemaker GI: Abdomen soft, normal BS, no masses, no tenderness EXTREMITIES:   Able to move X 4 extremities NEUROLOGICAL: There is no tremor. Speech is clear PSYCHIATRIC: Alert to self and place, disoriented to time. Affect and behavior are appropriate   Labs reviewed: Recent Labs    10/27/17 1110 11/01/17 1335 03/12/18 1454  NA 132* 138 138  K 3.8 3.6 3.9  CL 98* 106 104  CO2 22 25 26   GLUCOSE 113* 125* 115*  BUN 47* 10 18  CREATININE 2.69* 0.95 0.92  CALCIUM 8.5* 8.4* 9.1   Recent Labs    09/11/17 1506 11/01/17 1335 03/12/18 1454  AST 15 19 15   ALT 6* 6* 7*  ALKPHOS 63 63 63  BILITOT 0.6 0.7 0.9  PROT 6.7 6.1* 6.6  ALBUMIN 4.2 3.2* 4.2   Recent Labs    11/01/17 1335 03/12/18 1454 08/24/18 1503  WBC 6.2 5.5 5.7  NEUTROABS 3.9 3.0 2.6  HGB 12.6* 13.1 13.4  HCT 37.6* 38.6* 40.0  MCV 99.4  99.4 103.0*  PLT 193 137* 122*   Lab Results  Component Value Date   TSH 0.892 05/18/2016      Assessment/Plan  1. Parkinson's disease (HCC) - stable, continue Sinemet 25-250 milligrams 1 tab BID   2. Other chronic pain - ell-controlled, continue Norco 5-3 25 mg 1 tab every 6 a.m., 2 PM and 10 PM, Neurontin 100 mg 1 capsule daily at bedtime till 09/07/18   3. History of DVT (deep vein thrombosis) - continue Xarelto 15 mg 1 tab at supper daily   4. Chronic allergic rhinitis - continue cetirizine 10 mg 1 tab daily at bedtime, Fluticasone 50 g/ACT1 spray to each nostril daily   5. Major depression in remission (HCC) - mood is stable,continue Pristiq ER 24 hour 50 1 tab every other day   6. BPH - continue tamsulosin ER 24-hour 0.4 mg 1 capsule daily    Family/ staff Communication: Discussed plan of care with resident.  Labs/tests ordered:  None  Goals of care:   Long Term / Palliative Care   Kenard Gower, NP [facility] [phone] (Monday-Friday 8:00 a.m. - 5:00 p.m.) [phone] (after hours)

## 2018-09-03 LAB — CUP PACEART REMOTE DEVICE CHECK
Battery Remaining Longevity: 42 mo
Brady Statistic AS VP Percent: 67 %
Brady Statistic AS VS Percent: 1 %
Date Time Interrogation Session: 20190815202603
Implantable Lead Implant Date: 20130424
Implantable Lead Location: 753860
Implantable Pulse Generator Implant Date: 20130424
Lead Channel Impedance Value: 574 Ohm
Lead Channel Pacing Threshold Amplitude: 0.625 V
Lead Channel Pacing Threshold Pulse Width: 0.4 ms
Lead Channel Setting Pacing Amplitude: 1.5 V
Lead Channel Setting Sensing Sensitivity: 4 mV
MDC IDC LEAD IMPLANT DT: 20130424
MDC IDC LEAD LOCATION: 753859
MDC IDC MSMT BATTERY IMPEDANCE: 1372 Ohm
MDC IDC MSMT BATTERY VOLTAGE: 2.77 V
MDC IDC MSMT LEADCHNL RA IMPEDANCE VALUE: 512 Ohm
MDC IDC MSMT LEADCHNL RA PACING THRESHOLD AMPLITUDE: 0.5 V
MDC IDC MSMT LEADCHNL RA PACING THRESHOLD PULSEWIDTH: 0.4 ms
MDC IDC SET LEADCHNL RV PACING AMPLITUDE: 2.5 V
MDC IDC SET LEADCHNL RV PACING PULSEWIDTH: 0.4 ms
MDC IDC STAT BRADY AP VP PERCENT: 32 %
MDC IDC STAT BRADY AP VS PERCENT: 0 %

## 2018-09-06 ENCOUNTER — Encounter: Payer: Self-pay | Admitting: Adult Health

## 2018-09-06 ENCOUNTER — Other Ambulatory Visit: Payer: Self-pay | Admitting: Adult Health

## 2018-09-06 NOTE — Progress Notes (Signed)
Opened in error; Disregard.

## 2018-09-11 ENCOUNTER — Encounter
Admission: RE | Admit: 2018-09-11 | Discharge: 2018-09-11 | Disposition: A | Discharge status: Home / Self Care | Payer: Medicare Other | Source: Ambulatory Visit | Attending: Internal Medicine | Admitting: Internal Medicine

## 2018-09-13 ENCOUNTER — Encounter: Payer: Self-pay | Admitting: Hematology and Oncology

## 2018-09-13 ENCOUNTER — Inpatient Hospital Stay: Payer: Medicare Other | Attending: Hematology and Oncology

## 2018-09-13 ENCOUNTER — Other Ambulatory Visit: Payer: Self-pay | Admitting: Hematology and Oncology

## 2018-09-13 ENCOUNTER — Inpatient Hospital Stay (HOSPITAL_BASED_OUTPATIENT_CLINIC_OR_DEPARTMENT_OTHER): Payer: Medicare Other | Admitting: Hematology and Oncology

## 2018-09-13 VITALS — BP 131/77 | HR 76 | Temp 97.8°F | Resp 18 | Wt 197.1 lb

## 2018-09-13 DIAGNOSIS — D7589 Other specified diseases of blood and blood-forming organs: Secondary | ICD-10-CM | POA: Diagnosis not present

## 2018-09-13 DIAGNOSIS — I825Y2 Chronic embolism and thrombosis of unspecified deep veins of left proximal lower extremity: Secondary | ICD-10-CM

## 2018-09-13 DIAGNOSIS — R888 Abnormal findings in other body fluids and substances: Secondary | ICD-10-CM

## 2018-09-13 DIAGNOSIS — E538 Deficiency of other specified B group vitamins: Secondary | ICD-10-CM

## 2018-09-13 DIAGNOSIS — Z86718 Personal history of other venous thrombosis and embolism: Secondary | ICD-10-CM

## 2018-09-13 DIAGNOSIS — Z7901 Long term (current) use of anticoagulants: Secondary | ICD-10-CM | POA: Diagnosis not present

## 2018-09-13 DIAGNOSIS — Z87891 Personal history of nicotine dependence: Secondary | ICD-10-CM | POA: Diagnosis not present

## 2018-09-13 DIAGNOSIS — D7289 Other specified disorders of white blood cells: Secondary | ICD-10-CM

## 2018-09-13 DIAGNOSIS — Z79899 Other long term (current) drug therapy: Secondary | ICD-10-CM | POA: Diagnosis not present

## 2018-09-13 LAB — CBC WITH DIFFERENTIAL/PLATELET
Basophils Absolute: 0.1 10*3/uL (ref 0–0.1)
Basophils Relative: 2 %
Eosinophils Absolute: 0.3 10*3/uL (ref 0–0.7)
Eosinophils Relative: 6 %
HCT: 37.9 % — ABNORMAL LOW (ref 40.0–52.0)
Hemoglobin: 12.6 g/dL — ABNORMAL LOW (ref 13.0–18.0)
Lymphocytes Relative: 31 %
Lymphs Abs: 1.7 10*3/uL (ref 1.0–3.6)
MCH: 34.3 pg — ABNORMAL HIGH (ref 26.0–34.0)
MCHC: 33.2 g/dL (ref 32.0–36.0)
MCV: 103.3 fL — ABNORMAL HIGH (ref 80.0–100.0)
Monocytes Absolute: 0.3 10*3/uL (ref 0.2–1.0)
Monocytes Relative: 5 %
Neutro Abs: 3.1 10*3/uL (ref 1.4–6.5)
Neutrophils Relative %: 56 %
Platelets: 147 10*3/uL — ABNORMAL LOW (ref 150–440)
RBC: 3.67 MIL/uL — ABNORMAL LOW (ref 4.40–5.90)
RDW: 14.5 % (ref 11.5–14.5)
WBC: 5.4 10*3/uL (ref 3.8–10.6)

## 2018-09-13 LAB — COMPREHENSIVE METABOLIC PANEL
ALT: 8 U/L (ref 0–44)
AST: 14 U/L — ABNORMAL LOW (ref 15–41)
Albumin: 4.2 g/dL (ref 3.5–5.0)
Alkaline Phosphatase: 59 U/L (ref 38–126)
Anion gap: 7 (ref 5–15)
BUN: 20 mg/dL (ref 8–23)
CO2: 28 mmol/L (ref 22–32)
Calcium: 9 mg/dL (ref 8.9–10.3)
Chloride: 106 mmol/L (ref 98–111)
Creatinine, Ser: 1.12 mg/dL (ref 0.61–1.24)
GFR calc Af Amer: >60 mL/min (ref >60)
GFR calc non Af Amer: 59 mL/min — ABNORMAL LOW (ref >60)
Glucose, Bld: 116 mg/dL — ABNORMAL HIGH (ref 70–99)
Potassium: 4.4 mmol/L (ref 3.5–5.1)
Sodium: 141 mmol/L (ref 135–145)
Total Bilirubin: 0.9 mg/dL (ref 0.3–1.2)
Total Protein: 6.4 g/dL — ABNORMAL LOW (ref 6.5–8.1)

## 2018-09-13 LAB — TSH: TSH: 1.231 u[IU]/mL (ref 0.350–4.500)

## 2018-09-13 LAB — FOLATE: Folate: 29 ng/mL (ref >5.9)

## 2018-09-13 NOTE — Progress Notes (Signed)
Patient offers no complaints today. 

## 2018-09-13 NOTE — Progress Notes (Signed)
[facility] day:  09/13/2018  Chief Complaint: Randy [name] is a 84 y.o. male with a history of left lower extremity deep venous thrombosis (DVT) who is seen for 6 month assessment on Xarelto.  HPI: The patient was last seen in the medical oncology clinic on 03/12/2018.  At that time, his mobility remained very limited.  Exam was stable.  CBC and chemistries were unremarkable. Ferritin was 93.  B12 was 615.  He continued Xarelto.  CBC on 08/24/2018 revealed a hematocrit of 40.0, hemoglobin 13.4, MCV 103.0, platelets 122,000, WBC 5700 with an ANC of 2600.  Differential included 46% segs, 45% lymphs, 3% monocytes, and 6% eosinophils.  Atypical lymphocytes were seen.  During the interim, patient has been doing well. He denies any acute symptoms. No lower extremity claudication pain or increased swelling. Patient denies bleeding; no hematochezia, melena, or gross hematuria. He has not experienced any episodes of shortness of breath or chest pain. Patient denies that he has experienced any B symptoms. He denies any interval infections.   Patient advises that he maintains an adequate appetite. He is eating well. Weight today is 197 lb 2 oz (89.4 kg), which compared to his last visit to the clinic, represents a 2 pound increase.   Patient complains of pain rated 7/10 in the clinic today.      Past Medical History:  Diagnosis Date  . Anxiety   . Arthritis   . Blood transfusion    hx of autologus transfusion  . Cataract cortical, senile   . Chicken pox   . Clotting disorder ([place])   . Complete heart block ([place])   . Depression    unspecified  . DVT (deep venous thrombosis) ([place]) 09/2014   left leg  . GERD (gastroesophageal reflux disease)   . Hereditary and idiopathic neuropathy   . History of depression   . Hypertension   . MCI (mild cognitive impairment) 10/22/2013  . Memory deficit 03/24/2011  . Memory loss   . Movement disorder   .  Neuromuscular disorder ([place])    peripheral  neuropathy  . Neuropathy   . Osteoarthritis   . Other hammer toe(s) (acquired), unspecified foot   . Pacemaker   . Parkinson's disease ([place])   . Parkinsonism ([place]) 10/22/2013  . Shortness of breath   . Spinal stenosis   . Synovitis and tenosynovitis, unspecified   . Third degree heart block ([place])   . Tremor     Past Surgical History:  Procedure Laterality Date  . CATARACT EXTRACTION  06/2005  . COLONOSCOPY  06/13/2006   PH colon polyps (MUS) repeat 7/20  . CORRECTION HAMMER TOE    . defibrilator  04/04/2012   Dual Chamber  . INSERT / REPLACE / REMOVE PACEMAKER  04/04/2012  . JOINT REPLACEMENT Right    THA  . JOINT REPLACEMENT Bilateral    TKA  . KNEE ARTHROSCOPY Bilateral    Right x 2 and Left  . KNEE SURGERY  [phone]  . LEAD REVISION N/A 04/05/2012   Procedure: LEAD REVISION;  Surgeon: Deboraha Sprang, MD;  Location: [facility] CATH LAB;  Service: Cardiovascular;  Laterality: N/A;  . PERMANENT PACEMAKER INSERTION N/A 04/04/2012   Procedure: PERMANENT PACEMAKER INSERTION;  Surgeon: Evans Lance, MD;  Location: [facility] CATH LAB;  Service: Cardiovascular;  Laterality: N/A;  . REPLACEMENT TOTAL HIP W/  RESURFACING IMPLANTS    . REPLACEMENT TOTAL KNEE BILATERAL  1997  . ROTATOR CUFF REPAIR  06/2008  . ROTATOR CUFF REPAIR Left 2010  . SHOULDER ARTHROSCOPY Left 06/19/2008   WITH SUBACROMIAL DECOMPRESSION. DISTAL CLAVICLE EXCISION. ARTHROSCOPIC ROTATOR CUFF REPAIR.  Marland Kitchen TOTAL HIP ARTHROPLASTY  2009   right  . UMBILICAL HERNIA REPAIR  2001  . VASECTOMY      Family History  Problem Relation Age of Onset  . Hypertension Mother   . Heart disease Mother   . Hypertension Father   . Seizures Father        epilepsy  . Heart disease Father   . Prostate cancer Other   . Cancer Other   . Hypertension Other   . Asthma Other   . Hypertension Brother   . Breast cancer Daughter     Social History:  reports that he quit smoking about 45 years  ago. His smoking use included cigarettes. He has a 25.00 pack-year smoking history. He has never used smokeless tobacco. He reports that he drinks alcohol. He reports that he does not use drugs.  He lives in [place]. They live in [place].  The patient is accompanied by his wife today.  Allergies:  Allergies  Allergen Reactions  . Citalopram     Other reaction(s): Unknown  . Codeine Nausea And Vomiting  . Sertraline     Other reaction(s): Unknown Other reaction(s): Unknown  . Lodine [Etodolac] Rash  . Percocet [Oxycodone-Acetaminophen] Rash    Tolerates tylenol Patient is able to take this recently    Current Medications: Current Outpatient Medications  Medication Sig Dispense Refill  . acetaminophen (TYLENOL) 325 MG tablet Take 650 mg by mouth 4 (four) times daily as needed. Max of 3000 mg of Apap in 24 hrs    . bisacodyl (BISCOLAX) 10 MG suppository Place 10 mg rectally 2 (two) times daily as needed for moderate constipation. Give until BM.    . carbidopa-levodopa (SINEMET IR) 25-250 MG tablet Take 1 tablet by mouth 2 (two) times daily.     . cetirizine (ZYRTEC) 10 MG tablet Take 10 mg by mouth at bedtime.    . cyanocobalamin 500 MCG tablet Take 500 mcg by mouth daily.    . fluticasone (FLONASE) 50 MCG/ACT nasal spray Place 1 spray into both nostrils daily.     Marland Kitchen guaiFENesin (ROBITUSSIN) 100 MG/5ML liquid Take 200 mg by mouth every 4 (four) hours as needed for cough. Notify provider if cough worsens/ continues longer than 3 days.    Marland Kitchen HYDROcodone-acetaminophen (NORCO/VICODIN) 5-325 MG tablet Take 1 tablet by mouth every 6 (six) hours as needed. breakthrough pain. Maximum dose of 3000 mg of Acetaminophen in 24 hours 60 tablet 0  . HYDROcodone-acetaminophen (NORCO/VICODIN) 5-325 MG tablet Take 1 tablet by mouth every 8 (eight) hours. pain. hold for sedation. may give prn doses in addition to scheduled for severe pain. Maximum of 3000 mg of Acetaminophen in 24 hours 60 tablet  0  . ipratropium (ATROVENT) 0.03 % nasal spray Place 2 sprays into both nostrils every 12 (twelve) hours.    Marland Kitchen ipratropium-albuterol (DUONEB) 0.5-2.5 (3) MG/3ML SOLN Take 3 mLs by nebulization every 6 (six) hours as needed (wheezing and shortness of breath with cough).    Marland Kitchen lisinopril (PRINIVIL,ZESTRIL) 40 MG tablet TAKE ONE TABLET BY MOUTH EVERY DAY 30 tablet 3  . magnesium hydroxide (MILK OF MAGNESIA) 400 MG/5ML suspension Take 30 mLs by mouth every 4 (four) hours as needed. If constipation/ no BM for 2 days    . OXYGEN Inhale 2 L into the lungs as needed.  Check O2 sat prior to placing on patient and at least every 4 hours after applying. Notify MD if O2 sat drop below baseline while receiving oxygen or no improvement in dyspnea    . OXYGEN Inhale 2 L into the lungs at bedtime. For sleep apnea like behavior    . pantoprazole (PROTONIX) 40 MG tablet Take 40 mg by mouth daily.    . Rivaroxaban (XARELTO) 15 MG TABS tablet Take 15 mg by mouth daily with supper. for anticoagulation. Monitor for bleeding/bruising.    . sennosides-docusate sodium (SENOKOT-S) 8.6-50 MG tablet Take 2 tablets by mouth 2 (two) times daily.     . sodium phosphate (FLEET) enema Place 1 enema rectally every three (3) days as needed. If constipation not relieved by milk of magnesia or bisacodyl suppository. Follow package directions    . sorbitol 70 % solution Take 30 mLs by mouth every 2 (two) hours as needed. Constipation. Give PO Q 2 hours until pt has large BM. Give first dose now along with Bisacodyl suppository    . tamsulosin (FLOMAX) 0.4 MG CAPS capsule Take 0.4 mg by mouth daily after supper. 30 mins after supper    . Zinc Oxide 10 % OINT Apply liberal amount topically to areas of skin irritation as needed. Ok to leave at bedside.    . desvenlafaxine (PRISTIQ) 50 MG 24 hr tablet Take 50 mg by mouth every other day.    . Melatonin 3 MG TABS Take 1 tablet by mouth at bedtime.    . NON FORMULARY Diet Type: Regular diet     . ondansetron (ZOFRAN) 4 MG tablet Take 4 mg by mouth every 6 (six) hours as needed for nausea or vomiting.     No current facility-administered medications for this visit.     Review of Systems  Constitutional: Negative for diaphoresis, fever, malaise/fatigue and weight loss (up 2 pounds).       Needing more assistance  HENT: Negative.   Eyes: Negative.   Respiratory: Negative for cough, hemoptysis, sputum production and shortness of breath.   Cardiovascular: Negative for chest pain, palpitations, orthopnea, leg swelling and PND.  Gastrointestinal: Negative for abdominal pain, blood in stool, constipation, diarrhea, melena, nausea and vomiting.  Genitourinary: Negative for dysuria, frequency, hematuria and urgency.       Urinary incontinence  Musculoskeletal: Positive for back pain and joint pain (OA in hips and shoulders). Negative for falls and myalgias.  Skin: Negative for itching and rash.  Neurological: Negative for dizziness, tremors, weakness (generalized) and headaches.       Decreased ability to ambulate; arrives in w/c  Endo/Heme/Allergies: Does not bruise/bleed easily.  Psychiatric/Behavioral: Negative for depression, memory loss and suicidal ideas. The patient is not nervous/anxious and does not have insomnia.   All other systems reviewed and are negative.  Performance status (ECOG): 3 - Symptomatic, >50% confined to bed  Vital Signs BP 131/77 (BP Location: Left Arm, Patient Position: Sitting)   Pulse 76   Temp 97.8 F (36.6 C) (Tympanic)   Resp 18   Wt 197 lb 2 oz (89.4 kg) Comment: As of 08-13-18  BMI 28.28 kg/m   Physical Exam  Constitutional: He is oriented to person, place, and time.  Fatigued appearing elderly gentleman sitting in a wheelchair in no acute distress.  HENT:  Head: Normocephalic and atraumatic.  Short gray hair  Eyes: Pupils are equal, round, and reactive to light. EOM are normal. No scleral icterus.  Glasses.  Blue eyes.  Neck:  Normal range  of motion. Neck supple. No tracheal deviation present. No thyromegaly present.  Cardiovascular: Normal rate, regular rhythm and normal heart sounds. Exam reveals no gallop and no friction rub.  No murmur heard. Pulmonary/Chest: Effort normal and breath sounds normal. No respiratory distress. He has no wheezes. He has no rales.  Poor diaphragmatic excursion  Abdominal: Soft. Bowel sounds are normal. He exhibits no distension. There is no tenderness.  Musculoskeletal: Normal range of motion. He exhibits edema (1+ BLE). He exhibits no tenderness.  Neurological: He is alert and oriented to person, place, and time. He displays weakness. Gait (2/2 debility) abnormal.  Skin: Skin is warm and dry. No rash noted. No erythema.  Left lower extremity bandage.  Psychiatric: Mood, affect and judgment normal.  Nursing note and vitals reviewed.   Appointment on 09/13/2018  Component Date Value Ref Range Status  . Sodium 09/13/2018 141  135 - 145 mmol/L Final  . Potassium 09/13/2018 4.4  3.5 - 5.1 mmol/L Final  . Chloride 09/13/2018 106  98 - 111 mmol/L Final  . CO2 09/13/2018 28  22 - 32 mmol/L Final  . Glucose, Bld 09/13/2018 116* 70 - 99 mg/dL Final  . BUN 09/13/2018 20  8 - 23 mg/dL Final  . Creatinine, Ser 09/13/2018 1.12  0.61 - 1.24 mg/dL Final  . Calcium 09/13/2018 9.0  8.9 - 10.3 mg/dL Final  . Total Protein 09/13/2018 6.4* 6.5 - 8.1 g/dL Final  . Albumin 09/13/2018 4.2  3.5 - 5.0 g/dL Final  . AST 09/13/2018 14* 15 - 41 U/L Final  . ALT 09/13/2018 8  0 - 44 U/L Final  . Alkaline Phosphatase 09/13/2018 59  38 - 126 U/L Final  . Total Bilirubin 09/13/2018 0.9  0.3 - 1.2 mg/dL Final  . GFR calc non Af Amer 09/13/2018 59* >60 mL/min Final  . GFR calc Af Amer 09/13/2018 >60  >60 mL/min Final   Comment: (NOTE) The eGFR has been calculated using the CKD EPI equation. This calculation has not been validated in all clinical situations. eGFR's persistently <60 mL/min signify possible Chronic  Kidney Disease.   Georgiann Hahn gap 09/13/2018 7  5 - 15 Final   Performed at [facility], [address] [place],  [address]  . WBC 09/13/2018 5.4  3.8 - 10.6 K/uL Final  . RBC 09/13/2018 3.67* 4.40 - 5.90 MIL/uL Final  . Hemoglobin 09/13/2018 12.6* 13.0 - 18.0 g/dL Final  . HCT 09/13/2018 37.9* 40.0 - 52.0 % Final  . MCV 09/13/2018 103.3* 80.0 - 100.0 fL Final  . MCH 09/13/2018 34.3* 26.0 - 34.0 pg Final  . MCHC 09/13/2018 33.2  32.0 - 36.0 g/dL Final  . RDW 09/13/2018 14.5  11.5 - 14.5 % Final  . Platelets 09/13/2018 147* 150 - 440 K/uL Final  . Neutrophils Relative % 09/13/2018 56  % Final  . Neutro Abs 09/13/2018 3.1  1.4 - 6.5 K/uL Final  . Lymphocytes Relative 09/13/2018 31  % Final  . Lymphs Abs 09/13/2018 1.7  1.0 - 3.6 K/uL Final  . Monocytes Relative 09/13/2018 5  % Final  . Monocytes Absolute 09/13/2018 0.3  0.2 - 1.0 K/uL Final  . Eosinophils Relative 09/13/2018 6  % Final  . Eosinophils Absolute 09/13/2018 0.3  0 - 0.7 K/uL Final  . Basophils Relative 09/13/2018 2  % Final  . Basophils Absolute 09/13/2018 0.1  0 - 0.1 K/uL Final   Performed at [facility], [place],  [place] [address]    Assessment:  Jerik Falletta is a 83 y.o. male with Parkinson's and a history of left lower extremity DVT in the setting of immobility and possible dehydration in 09/2014.  He remains on Xarelto secondary to his persistent immobility.  Hypercoagulable work-up on 03/17/2015 revealed the following normal studies:  Factor V Leiden, prothrombin gene mutation, lupus anticoagulant, and anticardiolipin antibodies.  He has no evidence of a myeloproliferative disorder.    Left lower extremity duplex on 03/17/2015 revealed interval improvement in the left femoral popliteal DVT.  Thrombus was nonocclusive.    He has thrombocytopenia possibly related to Mebetiq (?).  Platelet count ranges between 141,000 and 172,000.  He has a history of a mild normocytic  anemia.  Diet is good.  B12 was 268 on 02/11/2016.  MMA was normal on 09/08/2016 thus r/o B12 deficiency.  B12 was 1051 on 03/09/2017 and 615 on 03/12/2018.  He denies any melena or hematochezia.  Folate was 11.3 on 02/11/2016.  TSH was 0.892 on 05/18/2016.  Ferritin has been followed:  151 on 02/11/2016, 179 on 05/27/2016, 76 on 09/08/2016, 88 on 03/09/2017, 97 on 09/11/2017, and 93 on 03/12/2018.  Symptomatically, he is fatigued.  He complains of pain in his shoulders and hips due to osteoarthritis.  Patient has progressive mobility limitations secondary to general debility.  He arrives to clinic in wheelchair today.  Exam reveals chronic lower extremity edema.  Patient denies shortness of breath, chest pain, or claudication pain in his extremities.  B12 elevated at 1,003 pg/mL.  Plan: 1. Labs today:  CBC with diff, CMP, B12, folate, TSH. 2. Left lower extremity DVT  Doing well. No increased edema, shortness of breath, or claudication pain.   On chronic anticoagulation therapy with no increased bruising or bleeding.    Continue rivaroxaban as previously prescribed. 3. Macrocytosis  MCV elevated at 103.3  B12 level elevated at 1003.  Folate normal.  TSH normal.  Etiology unknown at this time.  Continue to monitor. 4. Atypical lymphocytes:  Etiology unclear (? transient or reactant vs CLL).  Consider flow cytometry.  No current indication for treatment if CLL diagnosed. 5.  RTC in 6 months for MD assessment and labs (CBC with diff, CMP, ferritin, B12).   Honor Loh, NP  09/13/2018, 3:23 PM   I saw and evaluated the patient, participating in the key portions of the service and reviewing pertinent diagnostic studies and records.  I reviewed the nurse practitioner's note and agree with the findings and the plan.  The assessment and plan were discussed with the patient.  A few questions were asked by the patient and answered.   Nolon Stalls, MD 09/13/2018, 3:23 PM

## 2018-09-14 LAB — VITAMIN B12: Vitamin B-12: 1003 pg/mL — ABNORMAL HIGH (ref 180–914)

## 2018-09-18 ENCOUNTER — Encounter: Payer: Medicare Other | Admitting: Internal Medicine

## 2018-09-25 ENCOUNTER — Non-Acute Institutional Stay (SKILLED_NURSING_FACILITY): Payer: Medicare Other | Admitting: Adult Health

## 2018-09-25 ENCOUNTER — Encounter: Payer: Self-pay | Admitting: Adult Health

## 2018-09-25 DIAGNOSIS — I10 Essential (primary) hypertension: Secondary | ICD-10-CM

## 2018-09-25 DIAGNOSIS — G20A1 Parkinson's disease without dyskinesia, without mention of fluctuations: Secondary | ICD-10-CM

## 2018-09-25 DIAGNOSIS — I825Y2 Chronic embolism and thrombosis of unspecified deep veins of left proximal lower extremity: Secondary | ICD-10-CM | POA: Diagnosis not present

## 2018-09-25 DIAGNOSIS — F419 Anxiety disorder, unspecified: Secondary | ICD-10-CM

## 2018-09-25 DIAGNOSIS — J309 Allergic rhinitis, unspecified: Secondary | ICD-10-CM | POA: Diagnosis not present

## 2018-09-25 DIAGNOSIS — N400 Enlarged prostate without lower urinary tract symptoms: Secondary | ICD-10-CM

## 2018-09-25 DIAGNOSIS — G8929 Other chronic pain: Secondary | ICD-10-CM

## 2018-09-25 DIAGNOSIS — F325 Major depressive disorder, single episode, in full remission: Secondary | ICD-10-CM

## 2018-09-25 DIAGNOSIS — G20 Parkinson's disease: Secondary | ICD-10-CM

## 2018-09-25 DIAGNOSIS — G4700 Insomnia, unspecified: Secondary | ICD-10-CM

## 2018-09-25 NOTE — Progress Notes (Deleted)
[facility] Room Number: 336-P Place of Service:  SNF (31) Provider:  Kenard Gower, NP  Patient Care Team: Lauro Regulus, MD as PCP - General (Internal Medicine)  Extended Emergency Contact Information Primary Emergency Contact: Marva Panda [place] Home Phone: [phone] Mobile Phone: [phone] Relation: Spouse Secondary Emergency Contact: Gwynneth Munson [place] Mobile Phone: [phone] Relation: Son  Code Status:  DNR  Goals of care: Advanced Directive information Advanced Directives 08/31/2018  Does Patient Have a Medical Advance Directive? Yes  Type of Advance Directive Out of facility DNR (pink MOST or yellow form)  Does patient want to make changes to medical advance directive? No - Patient declined  Copy of Healthcare Power of Attorney in Chart? -  Would patient like information on creating a medical advance directive? -  Pre-existing out of facility DNR order (yellow form or pink MOST form) Yellow form placed in chart (order not valid for inpatient use)     Chief Complaint  Patient presents with  . Medical Management of Chronic Issues    Routine [facility] visit    HPI:  Pt is an 84 y.o. male seen today for medical management of chronic diseases.  He is a long-term care resident of [facility]	[place].  He has a PMH of anxiety, arthritis, DVT, hypertension, and depression.  He is currently being followed by palliative care.   Past Medical History:  Diagnosis Date  . Anxiety   . Arthritis   . Blood transfusion    hx of autologus transfusion  . Cataract cortical, senile   . Chicken pox   . Clotting disorder (HCC)   . Complete heart block (HCC)   . Depression    unspecified  . DVT (deep venous thrombosis) (HCC) 09/2014   left leg  . GERD (gastroesophageal reflux disease)   . Hereditary and idiopathic neuropathy   . History of depression   . Hypertension    . MCI (mild cognitive impairment) 10/22/2013  . Memory deficit 03/24/2011  . Memory loss   . Movement disorder   . Neuromuscular disorder (HCC)    peripheral  neuropathy  . Neuropathy   . Osteoarthritis   . Other hammer toe(s) (acquired), unspecified foot   . Pacemaker   . Parkinson's disease (HCC)   . Parkinsonism (HCC) 10/22/2013  . Shortness of breath   . Spinal stenosis   . Synovitis and tenosynovitis, unspecified   . Third degree heart block (HCC)   . Tremor    Past Surgical History:  Procedure Laterality Date  . CATARACT EXTRACTION  06/2005  . COLONOSCOPY  06/13/2006   PH colon polyps (MUS) repeat 7/20  . CORRECTION HAMMER TOE    . defibrilator  04/04/2012   Dual Chamber  . INSERT / REPLACE / REMOVE PACEMAKER  04/04/2012  . JOINT REPLACEMENT Right    THA  . JOINT REPLACEMENT Bilateral    TKA  . KNEE ARTHROSCOPY Bilateral    Right x 2 and Left  . KNEE SURGERY  [phone]  . LEAD REVISION N/A 04/05/2012   Procedure: LEAD REVISION;  Surgeon: Duke Salvia, MD;  Location: [facility] CATH LAB;  Service: Cardiovascular;  Laterality: N/A;  . PERMANENT PACEMAKER INSERTION N/A 04/04/2012   Procedure: PERMANENT PACEMAKER INSERTION;  Surgeon: Marinus Maw, MD;  Location: [facility] CATH LAB;  Service: Cardiovascular;  Laterality: N/A;  . REPLACEMENT TOTAL HIP W/  RESURFACING IMPLANTS    . REPLACEMENT TOTAL  KNEE BILATERAL  1997  . ROTATOR CUFF REPAIR  06/2008  . ROTATOR CUFF REPAIR Left 2010  . SHOULDER ARTHROSCOPY Left 06/19/2008   WITH SUBACROMIAL DECOMPRESSION. DISTAL CLAVICLE EXCISION. ARTHROSCOPIC ROTATOR CUFF REPAIR.  Marland Kitchen TOTAL HIP ARTHROPLASTY  2009   right  . UMBILICAL HERNIA REPAIR  2001  . VASECTOMY      Allergies  Allergen Reactions  . Citalopram     Other reaction(s): Unknown  . Codeine Nausea And Vomiting  . Sertraline     Other reaction(s): Unknown Other reaction(s): Unknown  . Lodine [Etodolac] Rash  . Percocet [Oxycodone-Acetaminophen] Rash    Tolerates  tylenol Patient is able to take this recently    Outpatient Encounter Medications as of 09/25/2018  Medication Sig  . acetaminophen (TYLENOL) 325 MG tablet Take 650 mg by mouth 4 (four) times daily as needed. Max of 3000 mg of Apap in 24 hrs  . bisacodyl (BISCOLAX) 10 MG suppository Place 10 mg rectally 2 (two) times daily as needed for moderate constipation. Give until BM.  . carbidopa-levodopa (SINEMET IR) 25-250 MG tablet Take 1 tablet by mouth 2 (two) times daily.   . cetirizine (ZYRTEC) 10 MG tablet Take 10 mg by mouth at bedtime.  . cyanocobalamin 500 MCG tablet Take 500 mcg by mouth daily.  Marland Kitchen desvenlafaxine (PRISTIQ) 50 MG 24 hr tablet Take 50 mg by mouth every other day.  . fluticasone (FLONASE) 50 MCG/ACT nasal spray Place 1 spray into both nostrils daily.   Marland Kitchen guaiFENesin (ROBITUSSIN) 100 MG/5ML liquid Take 200 mg by mouth every 4 (four) hours as needed for cough. Notify provider if cough worsens/ continues longer than 3 days.  Marland Kitchen HYDROcodone-acetaminophen (NORCO/VICODIN) 5-325 MG tablet Take 1 tablet by mouth every 6 (six) hours as needed. breakthrough pain. Maximum dose of 3000 mg of Acetaminophen in 24 hours  . HYDROcodone-acetaminophen (NORCO/VICODIN) 5-325 MG tablet Take 1 tablet by mouth every 8 (eight) hours. pain. hold for sedation. may give prn doses in addition to scheduled for severe pain. Maximum of 3000 mg of Acetaminophen in 24 hours  . ipratropium (ATROVENT) 0.03 % nasal spray Place 2 sprays into both nostrils every 12 (twelve) hours.  Marland Kitchen ipratropium-albuterol (DUONEB) 0.5-2.5 (3) MG/3ML SOLN Take 3 mLs by nebulization every 6 (six) hours as needed (wheezing and shortness of breath with cough).  Marland Kitchen lisinopril (PRINIVIL,ZESTRIL) 40 MG tablet TAKE ONE TABLET BY MOUTH EVERY DAY  . LORazepam (ATIVAN) 0.5 MG tablet Take 0.25 mg by mouth at bedtime as needed for anxiety. Give 1/2 tablet to = 0.25 mg  . magnesium hydroxide (MILK OF MAGNESIA) 400 MG/5ML suspension Take 30 mLs by  mouth every 4 (four) hours as needed. If constipation/ no BM for 2 days  . Melatonin 3 MG TABS Take 1 tablet by mouth at bedtime.  . NON FORMULARY Diet Type: Regular diet  . ondansetron (ZOFRAN) 4 MG tablet Take 4 mg by mouth every 6 (six) hours as needed for nausea or vomiting.  . OXYGEN Inhale 2 L into the lungs as needed. Check O2 sat prior to placing on patient and at least every 4 hours after applying. Notify MD if O2 sat drop below baseline while receiving oxygen or no improvement in dyspnea  . OXYGEN Inhale 2 L into the lungs at bedtime. For sleep apnea like behavior  . pantoprazole (PROTONIX) 40 MG tablet Take 40 mg by mouth daily.  . Rivaroxaban (XARELTO) 15 MG TABS tablet Take 15 mg by mouth daily with supper. for  anticoagulation. Monitor for bleeding/bruising.  . sennosides-docusate sodium (SENOKOT-S) 8.6-50 MG tablet Take 2 tablets by mouth 2 (two) times daily.   . sodium phosphate (FLEET) enema Place 1 enema rectally every three (3) days as needed. If constipation not relieved by milk of magnesia or bisacodyl suppository. Follow package directions  . sorbitol 70 % solution Take 30 mLs by mouth every 2 (two) hours as needed. Constipation. Give PO Q 2 hours until pt has large BM. Give first dose now along with Bisacodyl suppository  . tamsulosin (FLOMAX) 0.4 MG CAPS capsule Take 0.4 mg by mouth daily after supper. 30 mins after supper  . Zinc Oxide 10 % OINT Apply liberal amount topically to areas of skin irritation as needed. Ok to leave at bedside.   No facility-administered encounter medications on file as of 09/25/2018.     Review of Systems  GENERAL: No change in appetite, no fatigue, no weight changes, no fever, chills or weakness SKIN: Denies rash, itching, wounds, ulcer sores, or nail abnormalities EYES: Denies change in vision, dry eyes, eye pain, itching or discharge EARS: Denies change in hearing, ringing in ears, or earache NOSE: Denies nasal congestion or  epistaxis MOUTH and THROAT: Denies oral discomfort, gingival pain or bleeding, pain from teeth or hoarseness   RESPIRATORY: no cough, SOB, DOE, wheezing, hemoptysis CARDIAC: No chest pain, edema or palpitations GI: No abdominal pain, diarrhea, constipation, heart burn, nausea or vomiting GU: Denies dysuria, frequency, hematuria, incontinence, or discharge MUSCULOSKELETAL: Denies joint pain, muscle pain, back pain, restricted movement, or unusual weakness CIRCULATION: Denies claudication, edema of legs, varicosities, or cold extremities NEUROLOGICAL: Denies dizziness, syncope, numbness, or headache PSYCHIATRIC: Denies feelings of depression or anxiety. No report of hallucinations, insomnia, paranoia, or agitation ENDOCRINE: Denies polyphagia, polyuria, polydipsia, heat or cold intolerance HEME/LYMPH: Denies excessive bruising, petechia, enlarged lymph nodes, or bleeding problems IMMUNOLOGIC: Denies history of frequent infections, AIDS, or use of immunosuppressive agents   Immunization History  Administered Date(s) Administered  . Influenza-Unspecified 10/20/2014, 09/10/2016, 09/02/2017  . PPD Test 12/05/2015, 11/26/2016  . Pneumococcal Polysaccharide-23 03/13/2014   Pertinent  Health Maintenance Due  Topic Date Due  . INFLUENZA VACCINE  07/12/2018  . PNA vac Low Risk Adult (2 of 2 - PCV13) 03/16/2019 (Originally 03/14/2015)     Vitals:   09/25/18 1618  BP: 124/62  Pulse: 60  Resp: 16  Temp: 98.4 F (36.9 C)  TempSrc: Oral  SpO2: 99%  Weight: 180 lb 14.4 oz (82.1 kg)  Height: 5\' 10"  (1.778 m)   Body mass index is 25.96 kg/m.  Physical Exam  GENERAL APPEARANCE: Well nourished. In no acute distress. Normal body habitus SKIN:  Skin is warm and dry. There are no suspicious lesions or rash HEAD: Normal in size and contour. No evidence of trauma EYES: Lids open and close normally. No blepharitis, entropion or ectropion. PERRL. Conjunctivae are clear and sclerae are white. Lenses  are without opacity EARS: Pinnae are normal. Patient hears normal voice tunes of the examiner MOUTH and THROAT: Lips are without lesions. Oral mucosa is moist and without lesions. Tongue is normal in shape, size, and color and without lesions NECK: supple, trachea midline, no neck masses, no thyroid tenderness, no thyromegaly LYMPHATICS: No LAN in the neck, no supraclavicular LAN RESPIRATORY: Breathing is even & unlabored, BS CTAB CARDIAC: RRR, no murmur,no extra heart sounds, no edema GI: Abdomen soft, normal BS, no masses, no tenderness, no hepatomegaly, no splenomegaly MUSCULOSKELETAL: No deformities. Movement at each extremity is full  and painless. Strength is 5/5 at each extremity. Back is without kyphosis or scoliosis CIRCULATION: Pedal pulses are 2+. There is no edema of the legs, ankles and feet NEUROLOGICAL: There is no tremor. Speech is clear PSYCHIATRIC: Alert and oriented X 3. Affect and behavior are appropriate  Labs reviewed: Recent Labs    11/01/17 1335 03/12/18 1454 09/13/18 1425  NA 138 138 141  K 3.6 3.9 4.4  CL 106 104 106  CO2 25 26 28   GLUCOSE 125* 115* 116*  BUN 10 18 20   CREATININE 0.95 0.92 1.12  CALCIUM 8.4* 9.1 9.0   Recent Labs    11/01/17 1335 03/12/18 1454 09/13/18 1425  AST 19 15 14*  ALT 6* 7* 8  ALKPHOS 63 63 59  BILITOT 0.7 0.9 0.9  PROT 6.1* 6.6 6.4*  ALBUMIN 3.2* 4.2 4.2   Recent Labs    03/12/18 1454 08/24/18 1503 09/13/18 1425  WBC 5.5 5.7 5.4  NEUTROABS 3.0 2.6 3.1  HGB 13.1 13.4 12.6*  HCT 38.6* 40.0 37.9*  MCV 99.4 103.0* 103.3*  PLT 137* 122* 147*   Lab Results  Component Value Date   TSH 1.231 09/13/2018   Assessment/Plan ***   Family/ staff Communication: ***  Labs/tests ordered:  ***  Goals of care:   Long-term care/palliative care.   Kenard Gower, NP [facility] and Adult Medicine [phone] (Monday-Friday 8:00 a.m. - 5:00 p.m.) [phone] (after hours)

## 2018-09-25 NOTE — Progress Notes (Signed)
[facility]:  [facility] Room Number: 336-P Place of Service:  SNF (31) Provider:  Kenard Gower, NP  Patient Care Team: Lauro Regulus, MD as PCP - General (Internal Medicine)  Extended Emergency Contact Information Primary Emergency Contact: Marva Panda [place] Home Phone: [phone] Mobile Phone: [phone] Relation: Spouse Secondary Emergency Contact: Gwynneth Munson [place] Mobile Phone: [phone] Relation: Son  Code Status:  DNR  Goals of care: Advanced Directive information Advanced Directives 08/31/2018  Does Patient Have a Medical Advance Directive? Yes  Type of Advance Directive Out of facility DNR (pink MOST or yellow form)  Does patient want to make changes to medical advance directive? No - Patient declined  Copy of Healthcare Power of Attorney in Chart? -  Would patient like information on creating a medical advance directive? -  Pre-existing out of facility DNR order (yellow form or pink MOST form) Yellow form placed in chart (order not valid for inpatient use)     Chief Complaint  Patient presents with  . Medical Management of Chronic Issues    Routine [facility] visit    HPI:  Pt is a 84 y.o. [name] seen today for medical management of chronic diseases.  He has PMH of anxiety, arthritis, DVT, hypertension and depression. He was reported to to get anxious when wife is not visiting. He was reported to have been hitting hitting on the call button and broke the toilet. He has Ativan PRN which needs to be re-ordered. He was seen in the room today. He was aware of his behavior and agreed to control his emotions when he gets stressed/whenever wife is not able to visit.   Past Medical History:  Diagnosis Date  . Anxiety   . Arthritis   . Blood transfusion    hx of autologus transfusion  . Cataract cortical, senile   . Chicken pox   . Clotting disorder (HCC)   .  Complete heart block (HCC)   . Depression    unspecified  . DVT (deep venous thrombosis) (HCC) 09/2014   left leg  . GERD (gastroesophageal reflux disease)   . Hereditary and idiopathic neuropathy   . History of depression   . Hypertension   . MCI (mild cognitive impairment) 10/22/2013  . Memory deficit 03/24/2011  . Memory loss   . Movement disorder   . Neuromuscular disorder (HCC)    peripheral  neuropathy  . Neuropathy   . Osteoarthritis   . Other hammer toe(s) (acquired), unspecified foot   . Pacemaker   . Parkinson's disease (HCC)   . Parkinsonism (HCC) 10/22/2013  . Shortness of breath   . Spinal stenosis   . Synovitis and tenosynovitis, unspecified   . Third degree heart block (HCC)   . Tremor    Past Surgical History:  Procedure Laterality Date  . CATARACT EXTRACTION  06/2005  . COLONOSCOPY  06/13/2006   PH colon polyps (MUS) repeat 7/20  . CORRECTION HAMMER TOE    . defibrilator  04/04/2012   Dual Chamber  . INSERT / REPLACE / REMOVE PACEMAKER  04/04/2012  . JOINT REPLACEMENT Right    THA  . JOINT REPLACEMENT Bilateral    TKA  . KNEE ARTHROSCOPY Bilateral    Right x 2 and Left  . KNEE SURGERY  [phone]  . LEAD REVISION N/A 04/05/2012   Procedure: LEAD REVISION;  Surgeon: Duke Salvia, MD;  Location: [facility] CATH LAB;  Service: Cardiovascular;  Laterality:  N/A;  . PERMANENT PACEMAKER INSERTION N/A 04/04/2012   Procedure: PERMANENT PACEMAKER INSERTION;  Surgeon: Marinus Maw, MD;  Location[facility] CATH LAB;  Service: Cardiovascular;  Laterality: N/A;  . REPLACEMENT TOTAL HIP W/  RESURFACING IMPLANTS    . REPLACEMENT TOTAL KNEE BILATERAL  1997  . ROTATOR CUFF REPAIR  06/2008  . ROTATOR CUFF REPAIR Left 2010  . SHOULDER ARTHROSCOPY Left 06/19/2008   WITH SUBACROMIAL DECOMPRESSION. DISTAL CLAVICLE EXCISION. ARTHROSCOPIC ROTATOR CUFF REPAIR.  Marland Kitchen TOTAL HIP ARTHROPLASTY  2009   right  . UMBILICAL HERNIA REPAIR  2001  . VASECTOMY      Allergies  Allergen Reactions   . Citalopram     Other reaction(s): Unknown  . Codeine Nausea And Vomiting  . Sertraline     Other reaction(s): Unknown Other reaction(s): Unknown  . Lodine [Etodolac] Rash  . Percocet [Oxycodone-Acetaminophen] Rash    Tolerates tylenol Patient is able to take this recently    Outpatient Encounter Medications as of 09/25/2018  Medication Sig  . acetaminophen (TYLENOL) 325 MG tablet Take 650 mg by mouth 4 (four) times daily as needed. Max of 3000 mg of Apap in 24 hrs  . bisacodyl (BISCOLAX) 10 MG suppository Place 10 mg rectally 2 (two) times daily as needed for moderate constipation. Give until BM.  . carbidopa-levodopa (SINEMET IR) 25-250 MG tablet Take 1 tablet by mouth 2 (two) times daily.   . cetirizine (ZYRTEC) 10 MG tablet Take 10 mg by mouth at bedtime.  . cyanocobalamin 500 MCG tablet Take 500 mcg by mouth daily.  Marland Kitchen desvenlafaxine (PRISTIQ) 50 MG 24 hr tablet Take 50 mg by mouth every other day.  . fluticasone (FLONASE) 50 MCG/ACT nasal spray Place 1 spray into both nostrils daily.   Marland Kitchen guaiFENesin (ROBITUSSIN) 100 MG/5ML liquid Take 200 mg by mouth every 4 (four) hours as needed for cough. Notify provider if cough worsens/ continues longer than 3 days.  Marland Kitchen HYDROcodone-acetaminophen (NORCO/VICODIN) 5-325 MG tablet Take 1 tablet by mouth every 6 (six) hours as needed. breakthrough pain. Maximum dose of 3000 mg of Acetaminophen in 24 hours  . HYDROcodone-acetaminophen (NORCO/VICODIN) 5-325 MG tablet Take 1 tablet by mouth every 8 (eight) hours. pain. hold for sedation. may give prn doses in addition to scheduled for severe pain. Maximum of 3000 mg of Acetaminophen in 24 hours  . ipratropium (ATROVENT) 0.03 % nasal spray Place 2 sprays into both nostrils every 12 (twelve) hours.  Marland Kitchen ipratropium-albuterol (DUONEB) 0.5-2.5 (3) MG/3ML SOLN Take 3 mLs by nebulization every 6 (six) hours as needed (wheezing and shortness of breath with cough).  Marland Kitchen lisinopril (PRINIVIL,ZESTRIL) 40 MG tablet  TAKE ONE TABLET BY MOUTH EVERY DAY  . LORazepam (ATIVAN) 0.5 MG tablet Take 0.25 mg by mouth at bedtime as needed for anxiety. Give 1/2 tablet to = 0.25 mg  . magnesium hydroxide (MILK OF MAGNESIA) 400 MG/5ML suspension Take 30 mLs by mouth every 4 (four) hours as needed. If constipation/ no BM for 2 days  . Melatonin 3 MG TABS Take 1 tablet by mouth at bedtime.  . NON FORMULARY Diet Type: Regular diet  . ondansetron (ZOFRAN) 4 MG tablet Take 4 mg by mouth every 6 (six) hours as needed for nausea or vomiting.  . OXYGEN Inhale 2 L into the lungs as needed. Check O2 sat prior to placing on patient and at least every 4 hours after applying. Notify MD if O2 sat drop below baseline while receiving oxygen or no improvement in dyspnea  .  OXYGEN Inhale 2 L into the lungs at bedtime. For sleep apnea like behavior  . pantoprazole (PROTONIX) 40 MG tablet Take 40 mg by mouth daily.  . Rivaroxaban (XARELTO) 15 MG TABS tablet Take 15 mg by mouth daily with supper. for anticoagulation. Monitor for bleeding/bruising.  . sennosides-docusate sodium (SENOKOT-S) 8.6-50 MG tablet Take 2 tablets by mouth 2 (two) times daily.   . sodium phosphate (FLEET) enema Place 1 enema rectally every three (3) days as needed. If constipation not relieved by milk of magnesia or bisacodyl suppository. Follow package directions  . sorbitol 70 % solution Take 30 mLs by mouth every 2 (two) hours as needed. Constipation. Give PO Q 2 hours until pt has large BM. Give first dose now along with Bisacodyl suppository  . tamsulosin (FLOMAX) 0.4 MG CAPS capsule Take 0.4 mg by mouth daily after supper. 30 mins after supper  . Zinc Oxide 10 % OINT Apply liberal amount topically to areas of skin irritation as needed. Ok to leave at bedside.   No facility-administered encounter medications on file as of 09/25/2018.     Review of Systems  GENERAL: No change in appetite, no fatigue, no weight changes, no fever, chills or weakness MOUTH and  THROAT: Denies oral discomfort, gingival pain or bleeding, pain from teeth or hoarseness   RESPIRATORY: no cough, SOB, DOE, wheezing, hemoptysis CARDIAC: No chest pain, edema or palpitations GI: No abdominal pain, diarrhea, constipation, heart burn, nausea or vomiting GU: Denies dysuria, frequency, hematuria, incontinence, or discharge PSYCHIATRIC: Denies feelings of depression or anxiety. No report of hallucinations, insomnia, paranoia, or agitation   Immunization History  Administered Date(s) Administered  . Influenza-Unspecified 10/20/2014, 09/10/2016, 09/02/2017  . PPD Test 12/05/2015, 11/26/2016  . Pneumococcal Polysaccharide-23 03/13/2014   Pertinent  Health Maintenance Due  Topic Date Due  . INFLUENZA VACCINE  07/12/2018  . PNA vac Low Risk Adult (2 of 2 - PCV13) 03/16/2019 (Originally 03/14/2015)       Vitals:   09/25/18 1618  BP: 124/62  Pulse: 60  Resp: 16  Temp: 98.4 F (36.9 C)  TempSrc: Oral  SpO2: 99%  Weight: 180 lb 14.4 oz (82.1 kg)  Height: 5\' 10"  (1.778 m)   Body mass index is 25.96 kg/m.  Physical Exam  GENERAL APPEARANCE: Well nourished. In no acute distress. Normal body habitus SKIN:  Skin is warm and dry.  MOUTH and THROAT: Lips are without lesions. Oral mucosa is moist and without lesions. Tongue is normal in shape, size, and color and without lesions RESPIRATORY: Breathing is even & unlabored, BS CTAB CARDIAC: RRR, no murmur,no extra heart sounds, no edema, left chest pacemaker GI: Abdomen soft, normal BS, no masses, no tenderness NEUROLOGICAL: There is no tremor. Speech is clear PSYCHIATRIC: Alert to self and place, disoriented to time. Affect and behavior are appropriate   Labs reviewed: Recent Labs    11/01/17 1335 03/12/18 1454 09/13/18 1425  NA 138 138 141  K 3.6 3.9 4.4  CL 106 104 106  CO2 25 26 28   GLUCOSE 125* 115* 116*  BUN 10 18 20   CREATININE 0.95 0.92 1.12  CALCIUM 8.4* 9.1 9.0   Recent Labs    11/01/17 1335  03/12/18 1454 09/13/18 1425  AST 19 15 14*  ALT 6* 7* 8  ALKPHOS 63 63 59  BILITOT 0.7 0.9 0.9  PROT 6.1* 6.6 6.4*  ALBUMIN 3.2* 4.2 4.2   Recent Labs    03/12/18 1454 08/24/18 1503 09/13/18 1425  WBC 5.5  5.7 5.4  NEUTROABS 3.0 2.6 3.1  HGB 13.1 13.4 12.6*  HCT 38.6* 40.0 37.9*  MCV 99.4 103.0* 103.3*  PLT 137* 122* 147*   Lab Results  Component Value Date   TSH 1.231 09/13/2018    Assessment/Plan  1. Allergic rhinitis, unspecified seasonality, unspecified trigger -stable, continue cetirizine 10 mg 1 tab nightly, fluticasone 50 mcg/ACT 1 spray to each nostril daily   2. Chronic deep vein thrombosis (DVT) of proximal vein of left lower extremity (HCC) -continue Xarelto 15 mg 1 tab daily   3. Essential hypertension -well-controlled, continue lisinopril 40 mg 1 tab daily   4. Insomnia, unspecified type -continue melatonin 3 mg 1 tab nightly   5. Other chronic pain -well-controlled, continue Norco 5-325 mg 1 tab every 6 a.m., 2 PM and 10 PM   6. Parkinson's disease (HCC) -stable, continue carbidopa-levodopa 25-250 mg 1 tab twice a day   7. Major depression in remission (HCC) -mood this is stable, continue Pristiq ER 24-hour 50 mg 1 tab daily   8. BPH without obstruction/lower urinary tract symptoms -stable, continue Flomax 0.4 mg ER 24-hour 1 capsule daily   9.  Anxiety- restart Ativan 0.5 mg 1/2 tab = 0.25 mg p.o. nightly as needed x14 days     Family/ staff Communication: Discussed plan of care with resident.  Labs/tests ordered: None  Goals of care:   Long-term care   Kenard Gower, NP [facility] and Adult Medicine [phone] (Monday-Friday 8:00 a.m. - 5:00 p.m.) [phone] (after hours)

## 2018-10-08 ENCOUNTER — Other Ambulatory Visit
Admission: RE | Admit: 2018-10-08 | Discharge: 2018-10-08 | Disposition: A | Discharge status: Home / Self Care | Payer: Medicare Other | Source: Skilled Nursing Facility | Attending: Internal Medicine | Admitting: Internal Medicine

## 2018-10-08 DIAGNOSIS — R319 Hematuria, unspecified: Secondary | ICD-10-CM | POA: Diagnosis present

## 2018-10-10 LAB — URINE CULTURE

## 2018-10-12 ENCOUNTER — Encounter
Admission: RE | Admit: 2018-10-12 | Discharge: 2018-10-12 | Disposition: A | Discharge status: Home / Self Care | Payer: Medicare Other | Source: Ambulatory Visit | Attending: Internal Medicine | Admitting: Internal Medicine

## 2018-10-13 ENCOUNTER — Other Ambulatory Visit
Admission: RE | Admit: 2018-10-13 | Discharge: 2018-10-13 | Disposition: A | Discharge status: Home / Self Care | Payer: Medicare Other | Source: Ambulatory Visit | Attending: Internal Medicine | Admitting: Internal Medicine

## 2018-10-13 DIAGNOSIS — R319 Hematuria, unspecified: Secondary | ICD-10-CM | POA: Insufficient documentation

## 2018-10-14 LAB — URINE CULTURE

## 2018-10-15 ENCOUNTER — Other Ambulatory Visit: Payer: Self-pay | Admitting: Adult Health

## 2018-10-15 MED ORDER — LORAZEPAM 0.5 MG PO TABS: ORAL_TABLET | ORAL | 0 refills | Status: DC

## 2018-10-17 ENCOUNTER — Other Ambulatory Visit: Payer: Self-pay | Admitting: Adult Health

## 2018-10-17 MED ORDER — LORAZEPAM 0.5 MG PO TABS: ORAL_TABLET | ORAL | 0 refills | Status: AC

## 2018-10-25 ENCOUNTER — Ambulatory Visit (INDEPENDENT_AMBULATORY_CARE_PROVIDER_SITE_OTHER): Payer: Medicare Other | Admitting: *Deleted

## 2018-10-25 DIAGNOSIS — I442 Atrioventricular block, complete: Secondary | ICD-10-CM

## 2018-10-25 NOTE — Progress Notes (Signed)
Remote pacemaker transmission.   

## 2018-10-26 ENCOUNTER — Encounter: Payer: Self-pay | Admitting: Adult Health

## 2018-10-26 ENCOUNTER — Non-Acute Institutional Stay (SKILLED_NURSING_FACILITY): Payer: Medicare Other | Admitting: Adult Health

## 2018-10-26 DIAGNOSIS — I442 Atrioventricular block, complete: Secondary | ICD-10-CM | POA: Diagnosis not present

## 2018-10-26 DIAGNOSIS — M159 Polyosteoarthritis, unspecified: Secondary | ICD-10-CM

## 2018-10-26 DIAGNOSIS — G20 Parkinson's disease: Secondary | ICD-10-CM

## 2018-10-26 DIAGNOSIS — I10 Essential (primary) hypertension: Secondary | ICD-10-CM

## 2018-10-26 DIAGNOSIS — K219 Gastro-esophageal reflux disease without esophagitis: Secondary | ICD-10-CM

## 2018-10-26 DIAGNOSIS — I825Y2 Chronic embolism and thrombosis of unspecified deep veins of left proximal lower extremity: Secondary | ICD-10-CM | POA: Diagnosis not present

## 2018-10-26 DIAGNOSIS — M5416 Radiculopathy, lumbar region: Secondary | ICD-10-CM

## 2018-10-26 DIAGNOSIS — M48061 Spinal stenosis, lumbar region without neurogenic claudication: Secondary | ICD-10-CM

## 2018-10-26 NOTE — Progress Notes (Signed)
Provider:  Synthia Innocent, NP [facility]:  [facility] Room Number: 336 P Place of Service:  SNF (31)   PCP: Lauro Regulus, MD Patient Care Team: Lauro Regulus, MD as PCP - General (Internal Medicine) Chilton Si Chong Sicilian, NP as Nurse Practitioner (Geriatric Medicine)  Extended Emergency Contact Information Primary Emergency Contact: Marva Panda [place] Home Phone: [phone] Mobile Phone: [phone] Relation: Spouse Secondary Emergency Contact: Gwynneth Munson [place] Mobile Phone: [phone] Relation: Son  Code Status: DNR Goals of Care: Advanced Directive information Advanced Directives 10/26/2018  Does Patient Have a Medical Advance Directive? Yes  Type of Advance Directive Out of facility DNR (pink MOST or yellow form)  Does patient want to make changes to medical advance directive? No - Patient declined  Copy of Healthcare Power of Attorney in Chart? -  Would patient like information on creating a medical advance directive? -  Pre-existing out of facility DNR order (yellow form or pink MOST form) Yellow form placed in chart (order not valid for inpatient use)      Allergies  Allergen Reactions  . Citalopram     Other reaction(s): Unknown  . Codeine Nausea And Vomiting  . Sertraline     Other reaction(s): Unknown Other reaction(s): Unknown  . Lodine [Etodolac] Rash  . Percocet [Oxycodone-Acetaminophen] Rash    Tolerates tylenol Patient is able to take this recently     Chief Complaint  Patient presents with  . Annual Exam        HPI: Patient is a 84 y.o. male seen today for an annual comprehensive examination.  He has remained overall this past year. He has not required any hospitalizations. He denies any changes in appetite; no uncontrolled pain; no cough or shortness of breath. Denies any anxiety or depressive thoughts. He continues to be followed for his chronic illnesses  including: dvt; hypertension; allergic rhinitis.   Past Medical History:  Diagnosis Date  . Anxiety   . Arthritis   . Blood transfusion    hx of autologus transfusion  . Cataract cortical, senile   . Chicken pox   . Clotting disorder (HCC)   . Complete heart block (HCC)   . Depression    unspecified  . DVT (deep venous thrombosis) (HCC) 09/2014   left leg  . GERD (gastroesophageal reflux disease)   . Hereditary and idiopathic neuropathy   . History of depression   . Hypertension   . MCI (mild cognitive impairment) 10/22/2013  . Memory deficit 03/24/2011  . Memory loss   . Movement disorder   . Neuromuscular disorder (HCC)    peripheral  neuropathy  . Neuropathy   . Osteoarthritis   . Other hammer toe(s) (acquired), unspecified foot   . Pacemaker   . Parkinson's disease (HCC)   . Parkinsonism (HCC) 10/22/2013  . Shortness of breath   . Spinal stenosis   . Synovitis and tenosynovitis, unspecified   . Third degree heart block (HCC)   . Tremor    Past Surgical History:  Procedure Laterality Date  . CATARACT EXTRACTION  06/2005  . COLONOSCOPY  06/13/2006   PH colon polyps (MUS) repeat 7/20  . CORRECTION HAMMER TOE    . defibrilator  04/04/2012   Dual Chamber  . INSERT / REPLACE / REMOVE PACEMAKER  04/04/2012  . JOINT REPLACEMENT Right    THA  . JOINT REPLACEMENT Bilateral    TKA  . KNEE ARTHROSCOPY Bilateral    Right  x 2 and Left  . KNEE SURGERY  [phone]  . LEAD REVISION N/A 04/05/2012   Procedure: LEAD REVISION;  Surgeon: Duke Salvia, MD;  Location: [facility] CATH LAB;  Service: Cardiovascular;  Laterality: N/A;  . PERMANENT PACEMAKER INSERTION N/A 04/04/2012   Procedure: PERMANENT PACEMAKER INSERTION;  Surgeon: Marinus Maw, MD;  Location: [facility] CATH LAB;  Service: Cardiovascular;  Laterality: N/A;  . REPLACEMENT TOTAL HIP W/  RESURFACING IMPLANTS    . REPLACEMENT TOTAL KNEE BILATERAL  1997  . ROTATOR CUFF REPAIR  06/2008  . ROTATOR CUFF REPAIR Left 2010  .  SHOULDER ARTHROSCOPY Left 06/19/2008   WITH SUBACROMIAL DECOMPRESSION. DISTAL CLAVICLE EXCISION. ARTHROSCOPIC ROTATOR CUFF REPAIR.  Marland Kitchen TOTAL HIP ARTHROPLASTY  2009   right  . UMBILICAL HERNIA REPAIR  2001  . VASECTOMY      reports that he quit smoking about 45 years ago. His smoking use included cigarettes. He has a 25.00 pack-year smoking history. He has never used smokeless tobacco. He reports that he drinks alcohol. He reports that he does not use drugs. Social History   Socioeconomic History  . Marital status: Married    Spouse name: Glena Norfolk  . Number of children: 3  . Years of education: College  . Highest education level: Not on file  Occupational History  . Occupation: Retired  Engineer, production  . Financial resource strain: Not on file  . Food insecurity:    Worry: Not on file    Inability: Not on file  . Transportation needs:    Medical: Not on file    Non-medical: Not on file  Tobacco Use  . Smoking status: Former Smoker    Packs/day: 1.00    Years: 25.00    Pack years: 25.00    Types: Cigarettes    Last attempt to quit: 06/24/1973    Years since quitting: 45.3  . Smokeless tobacco: Never Used  Substance and Sexual Activity  . Alcohol use: Yes    Comment: occasional  . Drug use: No  . Sexual activity: Not Currently  Lifestyle  . Physical activity:    Days per week: Not on file    Minutes per session: Not on file  . Stress: Not on file  Relationships  . Social connections:    Talks on phone: Not on file    Gets together: Not on file    Attends religious service: Not on file    Active member of club or organization: Not on file    Attends meetings of clubs or organizations: Not on file    Relationship status: Not on file  . Intimate partner violence:    Fear of current or ex partner: Not on file    Emotionally abused: Not on file    Physically abused: Not on file    Forced sexual activity: Not on file  Other Topics Concern  . Not on file  Social History  Narrative   Patient lives at home with spouse.   Caffeine Use: 2 cups of coffee daily      Admitted to [facility] 11/18/2015   Married    3 children   Former smoker   Occasional drinker   DNR   Family History  Problem Relation Age of Onset  . Hypertension Mother   . Heart disease Mother   . Hypertension Father   . Seizures Father        epilepsy  . Heart disease Father   . Prostate cancer Other   .  Cancer Other   . Hypertension Other   . Asthma Other   . Hypertension Brother   . Breast cancer Daughter     Vitals:   10/26/18 1017  Weight: 193 lb (87.5 kg)  Height: 5\' 10"  (1.778 m)   Body mass index is 27.69 kg/m.  Allergies as of 10/26/2018      Reactions   Citalopram    Other reaction(s): Unknown   Codeine Nausea And Vomiting   Sertraline    Other reaction(s): Unknown Other reaction(s): Unknown   Lodine [etodolac] Rash   Percocet [oxycodone-acetaminophen] Rash   Tolerates tylenol Patient is able to take this recently      Medication List        Accurate as of 10/26/18 10:26 AM. Always use your most recent med list.          acetaminophen 325 MG tablet Commonly known as:  TYLENOL Take 650 mg by mouth 4 (four) times daily as needed. Max of 3000 mg of Apap in 24 hrs   BISCOLAX 10 MG suppository Generic drug:  bisacodyl Place 10 mg rectally 2 (two) times daily as needed for moderate constipation. Give until BM.   carbidopa-levodopa 25-250 MG tablet Commonly known as:  SINEMET IR Take 1 tablet by mouth 2 (two) times daily.   cetirizine 10 MG tablet Commonly known as:  ZYRTEC Take 10 mg by mouth at bedtime.   desvenlafaxine 50 MG 24 hr tablet Commonly known as:  PRISTIQ Take 50 mg by mouth every other day.   FLONASE 50 MCG/ACT nasal spray Generic drug:  fluticasone Place 1 spray into both nostrils daily.   guaiFENesin 100 MG/5ML liquid Commonly known as:  ROBITUSSIN Take 200 mg by mouth every 4 (four) hours as needed for cough.  Notify provider if cough worsens/ continues longer than 3 days.   HYDROcodone-acetaminophen 5-325 MG tablet Commonly known as:  NORCO/VICODIN Take 1 tablet by mouth every 6 (six) hours as needed. breakthrough pain. Maximum dose of 3000 mg of Acetaminophen in 24 hours   HYDROcodone-acetaminophen 5-325 MG tablet Commonly known as:  NORCO/VICODIN Take 1 tablet by mouth every 8 (eight) hours. pain. hold for sedation. may give prn doses in addition to scheduled for severe pain. Maximum of 3000 mg of Acetaminophen in 24 hours   ipratropium 0.03 % nasal spray Commonly known as:  ATROVENT Place 2 sprays into both nostrils every 12 (twelve) hours.   ipratropium-albuterol 0.5-2.5 (3) MG/3ML Soln Commonly known as:  DUONEB Take 3 mLs by nebulization every 6 (six) hours as needed (wheezing and shortness of breath with cough).   lisinopril 40 MG tablet Commonly known as:  PRINIVIL,ZESTRIL TAKE ONE TABLET BY MOUTH EVERY DAY   LORazepam 0.5 MG tablet Commonly known as:  ATIVAN Give 1/2 tablet to = 0.25 mg  As needed at bedtime for anxiety   magnesium hydroxide 400 MG/5ML suspension Commonly known as:  MILK OF MAGNESIA Take 30 mLs by mouth every 4 (four) hours as needed. If constipation/ no BM for 2 days   Melatonin 3 MG Tabs Take 1 tablet by mouth at bedtime.   NON FORMULARY Diet Type: Regular diet   ondansetron 4 MG tablet Commonly known as:  ZOFRAN Take 4 mg by mouth every 6 (six) hours as needed for nausea or vomiting.   OXYGEN Inhale 2 L into the lungs as needed. Check O2 sat prior to placing on patient and at least every 4 hours after applying. Notify MD if O2 sat drop  below baseline while receiving oxygen or no improvement in dyspnea   OXYGEN Inhale 2 L into the lungs at bedtime. For sleep apnea like behavior   pantoprazole 40 MG tablet Commonly known as:  PROTONIX Take 40 mg by mouth daily.   Rivaroxaban 15 MG Tabs tablet Commonly known as:  XARELTO Take 15 mg by mouth  daily with supper. for anticoagulation. Monitor for bleeding/bruising.   sennosides-docusate sodium 8.6-50 MG tablet Commonly known as:  SENOKOT-S Take 2 tablets by mouth 2 (two) times daily.   sodium phosphate enema Place 1 enema rectally every three (3) days as needed. If constipation not relieved by milk of magnesia or bisacodyl suppository. Follow package directions   sorbitol 70 % solution Take 30 mLs by mouth every 2 (two) hours as needed. Constipation. Give PO Q 2 hours until pt has large BM. Give first dose now along with Bisacodyl suppository   tamsulosin 0.4 MG Caps capsule Commonly known as:  FLOMAX Take 0.4 mg by mouth daily after supper. 30 mins after supper   vitamin B-12 500 MCG tablet Commonly known as:  CYANOCOBALAMIN Take 500 mcg by mouth daily.   Zinc Oxide 10 % Oint Apply liberal amount topically to areas of skin irritation as needed. Ok to leave at bedside.        SIGNIFICANT DIAGNOSTIC EXAMS  LABS REVIEWED PREVIOUS:   03-12-18: wbc 5.5; hgb 13.1; hct 38.6; mcv 99.4; plt 137; glucose 115; bun 18; creat 0.92; k+ 3.9; na++ 138; ca 9.1; liver normal albumin 4.2; vit B 12: 615;  05-03-18: urine culture: no growth   TODAY:   08-24-18: urine culture: none 09-13-18: wbc  5.4; hgb 12.6; hc6 37.9; mcv 103.3; plt 147; glucose 116; bun 20; creat 1.12; k+ 4.4; na++ 41; ca 9.0; liver normal albumin 4.2 tsh 1.231 10-08-18:  Urine culture: multiple species 10-13-18: urine culture: <10,000 colonies.   Review of Systems  Constitutional: Negative for malaise/fatigue.  HENT: Negative for congestion.   Eyes: Negative for pain.  Respiratory: Negative for cough and shortness of breath.   Cardiovascular: Negative for chest pain, palpitations and leg swelling.  Gastrointestinal: Negative for abdominal pain, constipation and heartburn.  Genitourinary: Negative for dysuria.  Musculoskeletal: Negative for back pain, joint pain and myalgias.  Skin: Negative.   Neurological:  Negative for dizziness.  Psychiatric/Behavioral: The patient is not nervous/anxious.     Physical Exam  Constitutional: He is oriented to person, place, and time. He appears well-developed and well-nourished. No distress.  Neck: No thyromegaly present.  Cardiovascular: Normal rate, regular rhythm, normal heart sounds and intact distal pulses.  Pulmonary/Chest: Effort normal and breath sounds normal. No respiratory distress.  Abdominal: Soft. Bowel sounds are normal. He exhibits no distension. There is no tenderness.  Musculoskeletal: He exhibits edema.  Is able to move all extremities Has trace bilateral lower extremity edema   Lymphadenopathy:    He has no cervical adenopathy.  Neurological: He is alert and oriented to person, place, and time.  Skin: Skin is warm and dry. He is not diaphoretic.  Psychiatric: He has a normal mood and affect.     ASSESSMENT/ PLAN:  TODAY:   1. Chronic deep vein thrombosis (DVT) of proximal vein of left lower extremity: is stable is on long term xarelto 15 mg daily   2. Complete heart block: is stable is status post pace maker insertion  3. Essential hypertension: is stable: will continue lisinopril 40 mg daily   4. Chronic allergic rhinitis: is stable  will continue zyrtec 10 mg daily flonase daily; atrovent nasal spray twice daily and has duoneb every 6 hours as needed  5. Chronic constipation: is stable will continue senna s 2 tabs twice daily   6. GERD without esophagitis: is stable will continue protonix 40 mg daily  7. Parkinson's disease: is stable will continue sinemet 25/250 mg three times daily   8. Osteoarthritis involving multiple joints of both sides of body has spinal stenosis of lumbar region with radiculopathy:  is stable will continue neurontin 100 mg twice daily  vicodin 5/325 mg every 8 hours and every 6 hours as needed  9. Nocturia more than twice per night: is stable will conitnue flomax 0.4 mg daily   Health maintenance  is up to date.   MD is aware of resident's narcotic use and is in agreement with current plan of care. We will wean dosage as appropriate for resident   Synthia InnocentDeborah Tiauna Whisnant NP [facility]  Contact [phone] Monday through Friday 8am- 5pm  After hours call [phone]

## 2018-10-30 ENCOUNTER — Encounter: Payer: Self-pay | Admitting: Adult Health

## 2018-11-03 ENCOUNTER — Other Ambulatory Visit
Admission: RE | Admit: 2018-11-03 | Discharge: 2018-11-03 | Disposition: A | Discharge status: Home / Self Care | Payer: Medicare Other | Source: Ambulatory Visit | Attending: Internal Medicine | Admitting: Internal Medicine

## 2018-11-03 DIAGNOSIS — R410 Disorientation, unspecified: Secondary | ICD-10-CM | POA: Insufficient documentation

## 2018-11-03 DIAGNOSIS — R3915 Urgency of urination: Secondary | ICD-10-CM | POA: Insufficient documentation

## 2018-11-03 DIAGNOSIS — R350 Frequency of micturition: Secondary | ICD-10-CM | POA: Diagnosis present

## 2018-11-03 LAB — URINALYSIS, COMPLETE (UACMP) WITH MICROSCOPIC
Bacteria, UA: NONE SEEN
Bilirubin Urine: NEGATIVE
GLUCOSE, UA: NEGATIVE mg/dL
HGB URINE DIPSTICK: NEGATIVE
Ketones, ur: NEGATIVE mg/dL
LEUKOCYTES UA: NEGATIVE
NITRITE: NEGATIVE
PROTEIN: NEGATIVE mg/dL
Specific Gravity, Urine: 1.003 — ABNORMAL LOW (ref 1.005–1.030)
Squamous Epithelial / LPF: NONE SEEN (ref 0–5)
pH: 7 (ref 5.0–8.0)

## 2018-11-05 LAB — URINE CULTURE: Culture: NO GROWTH

## 2018-11-07 ENCOUNTER — Other Ambulatory Visit: Payer: Self-pay | Admitting: Adult Health

## 2018-11-07 ENCOUNTER — Other Ambulatory Visit
Admission: RE | Admit: 2018-11-07 | Discharge: 2018-11-07 | Disposition: A | Discharge status: Home / Self Care | Payer: Medicare Other | Source: Skilled Nursing Facility | Attending: Internal Medicine | Admitting: Internal Medicine

## 2018-11-07 DIAGNOSIS — R350 Frequency of micturition: Secondary | ICD-10-CM | POA: Insufficient documentation

## 2018-11-07 DIAGNOSIS — R410 Disorientation, unspecified: Secondary | ICD-10-CM | POA: Diagnosis not present

## 2018-11-07 DIAGNOSIS — R3915 Urgency of urination: Secondary | ICD-10-CM | POA: Diagnosis not present

## 2018-11-07 LAB — URINALYSIS, ROUTINE W REFLEX MICROSCOPIC
Bilirubin Urine: NEGATIVE
Glucose, UA: NEGATIVE mg/dL
Hgb urine dipstick: NEGATIVE
Ketones, ur: NEGATIVE mg/dL
LEUKOCYTES UA: NEGATIVE
NITRITE: NEGATIVE
PH: 7 (ref 5.0–8.0)
Protein, ur: NEGATIVE mg/dL
SPECIFIC GRAVITY, URINE: 1.006 (ref 1.005–1.030)

## 2018-11-07 MED ORDER — LORAZEPAM 0.5 MG PO TABS: 0.5000 mg | ORAL_TABLET | Freq: Every day | ORAL | 0 refills | Status: DC

## 2018-11-07 MED ORDER — LORAZEPAM 0.5 MG PO TABS: 0.5000 mg | ORAL_TABLET | Freq: Two times a day (BID) | ORAL | 0 refills | Status: DC | PRN

## 2018-11-09 LAB — URINE CULTURE

## 2018-11-12 ENCOUNTER — Other Ambulatory Visit: Payer: Self-pay | Admitting: Adult Health

## 2018-11-12 MED ORDER — HYDROCODONE-ACETAMINOPHEN 5-325 MG PO TABS: 1.0000 | ORAL_TABLET | Freq: Four times a day (QID) | ORAL | 0 refills | Status: DC | PRN

## 2018-11-12 MED ORDER — HYDROCODONE-ACETAMINOPHEN 5-325 MG PO TABS: ORAL_TABLET | ORAL | 0 refills | Status: DC

## 2018-11-12 MED ORDER — HYDROCODONE-ACETAMINOPHEN 5-325 MG PO TABS: 1.0000 | ORAL_TABLET | Freq: Three times a day (TID) | ORAL | 0 refills | Status: DC

## 2018-11-23 ENCOUNTER — Encounter: Payer: Self-pay | Admitting: Adult Health

## 2018-11-23 ENCOUNTER — Non-Acute Institutional Stay (SKILLED_NURSING_FACILITY): Payer: Medicare Other | Admitting: Adult Health

## 2018-11-23 DIAGNOSIS — I10 Essential (primary) hypertension: Secondary | ICD-10-CM | POA: Diagnosis not present

## 2018-11-23 DIAGNOSIS — I825Y2 Chronic embolism and thrombosis of unspecified deep veins of left proximal lower extremity: Secondary | ICD-10-CM

## 2018-11-23 DIAGNOSIS — I442 Atrioventricular block, complete: Secondary | ICD-10-CM | POA: Diagnosis not present

## 2018-11-23 NOTE — Progress Notes (Signed)
Location:   [facility] Room Number: 336 P Place of Service:  SNF (31)   CODE STATUS: DNR  Allergies  Allergen Reactions  . Citalopram     Other reaction(s): Unknown  . Codeine Nausea And Vomiting  . Sertraline     Other reaction(s): Unknown Other reaction(s): Unknown  . Lodine [Etodolac] Rash  . Percocet [Oxycodone-Acetaminophen] Rash    Tolerates tylenol Patient is able to take this recently    Chief Complaint  Patient presents with  . Medical Management of Chronic Issues    Chronic deep vein thrombosis (DVT) of proximal vein of left lower extremity; complete heart block; essential hypertension     HPI:  He is a 84 year old long term resident of this facility being seen for the management of her chronic illnesses: dvt; chb; and hypertension. He denies any chest pain or palpitations; he denies any uncontrolled hip pain and no changes in appetite.   Past Medical History:  Diagnosis Date  . Anxiety   . Arthritis   . Blood transfusion    hx of autologus transfusion  . Cataract cortical, senile   . Chicken pox   . Clotting disorder (HCC)   . Complete heart block (HCC)   . Depression    unspecified  . DVT (deep venous thrombosis) (HCC) 09/2014   left leg  . GERD (gastroesophageal reflux disease)   . Hereditary and idiopathic neuropathy   . History of depression   . Hypertension   . MCI (mild cognitive impairment) 10/22/2013  . Memory deficit 03/24/2011  . Memory loss   . Movement disorder   . Neuromuscular disorder (HCC)    peripheral  neuropathy  . Neuropathy   . Osteoarthritis   . Other hammer toe(s) (acquired), unspecified foot   . Pacemaker   . Parkinson's disease (HCC)   . Parkinsonism (HCC) 10/22/2013  . Shortness of breath   . Spinal stenosis   . Synovitis and tenosynovitis, unspecified   . Third degree heart block (HCC)   . Tremor     Past Surgical History:  Procedure Laterality Date  . CATARACT EXTRACTION  06/2005    . COLONOSCOPY  06/13/2006   PH colon polyps (MUS) repeat 7/20  . CORRECTION HAMMER TOE    . defibrilator  04/04/2012   Dual Chamber  . INSERT / REPLACE / REMOVE PACEMAKER  04/04/2012  . JOINT REPLACEMENT Right    THA  . JOINT REPLACEMENT Bilateral    TKA  . KNEE ARTHROSCOPY Bilateral    Right x 2 and Left  . KNEE SURGERY  [phone]  . LEAD REVISION N/A 04/05/2012   Procedure: LEAD REVISION;  Surgeon: Duke Salvia, MD;  Location: [facility] CATH LAB;  Service: Cardiovascular;  Laterality: N/A;  . PERMANENT PACEMAKER INSERTION N/A 04/04/2012   Procedure: PERMANENT PACEMAKER INSERTION;  Surgeon: Marinus Maw, MD;  Location[facility] CATH LAB;  Service: Cardiovascular;  Laterality: N/A;  . REPLACEMENT TOTAL HIP W/  RESURFACING IMPLANTS    . REPLACEMENT TOTAL KNEE BILATERAL  1997  . ROTATOR CUFF REPAIR  06/2008  . ROTATOR CUFF REPAIR Left 2010  . SHOULDER ARTHROSCOPY Left 06/19/2008   WITH SUBACROMIAL DECOMPRESSION. DISTAL CLAVICLE EXCISION. ARTHROSCOPIC ROTATOR CUFF REPAIR.  Marland Kitchen TOTAL HIP ARTHROPLASTY  2009   right  . UMBILICAL HERNIA REPAIR  2001  . VASECTOMY      Social History   Socioeconomic History  . Marital status: Married    Spouse name: Glena Norfolk  .  Number of children: 3  . Years of education: College  . Highest education level: Not on file  Occupational History  . Occupation: Retired  Engineer, production  . Financial resource strain: Not on file  . Food insecurity:    Worry: Not on file    Inability: Not on file  . Transportation needs:    Medical: Not on file    Non-medical: Not on file  Tobacco Use  . Smoking status: Former Smoker    Packs/day: 1.00    Years: 25.00    Pack years: 25.00    Types: Cigarettes    Last attempt to quit: 06/24/1973    Years since quitting: 45.4  . Smokeless tobacco: Never Used  Substance and Sexual Activity  . Alcohol use: Not Currently    Comment: occasional  . Drug use: No  . Sexual activity: Not Currently  Lifestyle  . Physical activity:     Days per week: Not on file    Minutes per session: Not on file  . Stress: Not on file  Relationships  . Social connections:    Talks on phone: Not on file    Gets together: Not on file    Attends religious service: Not on file    Active member of club or organization: Not on file    Attends meetings of clubs or organizations: Not on file    Relationship status: Not on file  . Intimate partner violence:    Fear of current or ex partner: Not on file    Emotionally abused: Not on file    Physically abused: Not on file    Forced sexual activity: Not on file  Other Topics Concern  . Not on file  Social History Narrative   Patient lives at home with spouse.   Caffeine Use: 2 cups of coffee daily      Admitted to [facility] 11/18/2015   Married    3 children   Former smoker   Occasional drinker   DNR   Family History  Problem Relation Age of Onset  . Hypertension Mother   . Heart disease Mother   . Hypertension Father   . Seizures Father        epilepsy  . Heart disease Father   . Prostate cancer Other   . Cancer Other   . Hypertension Other   . Asthma Other   . Hypertension Brother   . Breast cancer Daughter       VITAL SIGNS BP 118/60   Pulse 100   Temp 98.9 F (37.2 C)   Resp 20   Ht 5\' 10"  (1.778 m)   Wt 193 lb (87.5 kg)   SpO2 98%   BMI 27.69 kg/m   Outpatient Encounter Medications as of 11/23/2018  Medication Sig  . acetaminophen (TYLENOL) 325 MG tablet Take 650 mg by mouth 4 (four) times daily as needed. Max of 3000 mg of Apap in 24 hrs  . alum & mag hydroxide-simeth (MAALOX PLUS) 400-400-40 MG/5ML suspension Take 30 mLs by mouth every 4 (four) hours as needed for indigestion.  . bisacodyl (BISCOLAX) 10 MG suppository Place 10 mg rectally 2 (two) times daily as needed for moderate constipation. Give until BM.  . carbidopa-levodopa (SINEMET IR) 25-250 MG tablet Take 1 tablet by mouth 2 (two) times daily.   . cetirizine (ZYRTEC) 10 MG tablet  Take 10 mg by mouth at bedtime.  Marland Kitchen desvenlafaxine (PRISTIQ) 50 MG 24 hr tablet Take 50 mg  by mouth every other day.  . fluticasone (FLONASE) 50 MCG/ACT nasal spray Place 1 spray into both nostrils daily.   Marland Kitchen. guaiFENesin (ROBITUSSIN) 100 MG/5ML liquid Take 200 mg by mouth every 4 (four) hours as needed for cough. Notify provider if cough worsens/ continues longer than 3 days.  Marland Kitchen. HYDROcodone-acetaminophen (NORCO) 5-325 MG tablet Take 1 tab every 8 hours routinely and every 6 hours as needed for pain.  Marland Kitchen. ipratropium (ATROVENT) 0.03 % nasal spray Place 2 sprays into both nostrils every 12 (twelve) hours.  Marland Kitchen. ipratropium-albuterol (DUONEB) 0.5-2.5 (3) MG/3ML SOLN Take 3 mLs by nebulization every 6 (six) hours as needed (wheezing and shortness of breath with cough).  Marland Kitchen. lisinopril (PRINIVIL,ZESTRIL) 40 MG tablet TAKE ONE TABLET BY MOUTH EVERY DAY  . magnesium hydroxide (MILK OF MAGNESIA) 400 MG/5ML suspension Take 30 mLs by mouth every 4 (four) hours as needed. If constipation/ no BM for 2 days  . Melatonin 3 MG TABS Take 1 tablet by mouth at bedtime. Should take 1 - 2 hours before sleep  . NON FORMULARY Diet Type: Regular diet  . ondansetron (ZOFRAN) 4 MG tablet Take 4 mg by mouth every 6 (six) hours as needed for nausea or vomiting.  . OXYGEN Inhale 2 L into the lungs as needed. Check O2 sat prior to placing on patient and at least every 4 hours after applying. Notify MD if O2 sat drop below baseline while receiving oxygen or no improvement in dyspnea  . OXYGEN Inhale 2 L into the lungs at bedtime. For sleep apnea like behavior  . pantoprazole (PROTONIX) 40 MG tablet Take 40 mg by mouth daily.  . Rivaroxaban (XARELTO) 15 MG TABS tablet Take 15 mg by mouth daily with supper. for anticoagulation. Monitor for bleeding/bruising.  . sennosides-docusate sodium (SENOKOT-S) 8.6-50 MG tablet Take 2 tablets by mouth 2 (two) times daily.   . sodium phosphate (FLEET) enema Place 1 enema rectally every three (3) days  as needed. If constipation not relieved by milk of magnesia or bisacodyl suppository. Follow package directions  . sorbitol 70 % solution Take 30 mLs by mouth every 2 (two) hours as needed. Constipation. Give PO Q 2 hours until pt has large BM. Give first dose now along with Bisacodyl suppository  . tamsulosin (FLOMAX) 0.4 MG CAPS capsule Take 0.4 mg by mouth daily after supper. 30 mins after supper  . Zinc Oxide 10 % OINT Apply liberal amount topically to areas of skin irritation as needed. Ok to leave at bedside.  . [DISCONTINUED] cyanocobalamin 500 MCG tablet Take 500 mcg by mouth daily.  . [DISCONTINUED] LORazepam (ATIVAN) 0.5 MG tablet Take 1 tablet (0.5 mg total) by mouth at bedtime. And twice daily as needed for anxiety or agitation (Patient not taking: Reported on 11/23/2018)   No facility-administered encounter medications on file as of 11/23/2018.      SIGNIFICANT DIAGNOSTIC EXAMS  LABS REVIEWED PREVIOUS:   03-12-18: wbc 5.5; hgb 13.1; hct 38.6; mcv 99.4; plt 137; glucose 115; bun 18; creat 0.92; k+ 3.9; na++ 138; ca 9.1; liver normal albumin 4.2; vit B 12: 615;  05-03-18: urine culture: no growth  08-24-18: urine culture: none 09-13-18: wbc  5.4; hgb 12.6; hc6 37.9; mcv 103.3; plt 147; glucose 116; bun 20; creat 1.12; k+ 4.4; na++ 41; ca 9.0; liver normal albumin 4.2 tsh 1.231 10-08-18:  Urine culture: multiple species 10-13-18: urine culture: <10,000 colonies.   TODAY:   11-03-18: urine culture: no growth 11-07-18: urine culture: <10,000  Review of Systems  Constitutional: Negative for malaise/fatigue.  Respiratory: Negative for cough and shortness of breath.   Cardiovascular: Negative for chest pain, palpitations and leg swelling.  Gastrointestinal: Negative for abdominal pain, constipation and heartburn.  Musculoskeletal: Negative for back pain, joint pain and myalgias.  Skin: Negative.   Neurological: Negative for dizziness.  Psychiatric/Behavioral: The patient is not  nervous/anxious.     Physical Exam Constitutional:      General: He is not in acute distress.    Appearance: Normal appearance. He is well-developed. He is not diaphoretic.  Neck:     Musculoskeletal: Neck supple.     Thyroid: No thyromegaly.  Cardiovascular:     Rate and Rhythm: Normal rate and regular rhythm.     Pulses: Normal pulses.     Heart sounds: Normal heart sounds.     Comments: Has pace maker Pulmonary:     Effort: Pulmonary effort is normal. No respiratory distress.     Breath sounds: Normal breath sounds.     Comments: Uses 02 at night  Abdominal:     General: Bowel sounds are normal. There is no distension.     Palpations: Abdomen is soft.     Tenderness: There is no abdominal tenderness.  Musculoskeletal:     Right lower leg: No edema.     Left lower leg: No edema.     Comments: Is able to move all extremities  Has history of knee and hip replacements   Lymphadenopathy:     Cervical: No cervical adenopathy.  Skin:    General: Skin is warm and dry.  Neurological:     Mental Status: He is alert and oriented to person, place, and time. Mental status is at baseline.      ASSESSMENT/ PLAN:  TODAY:   1. Chronic deep vein thrombosis (DVT) of proximal vein of left lower extremity: is stable is on long term xarelto 15 mg daily   2. Complete heart block: is stable is status post pace maker insertion  3. Essential hypertension: is stable:b/p 118/60 will continue lisinopril 40 mg daily   PREVIOUS   4. Chronic allergic rhinitis: is stable will continue zyrtec 10 mg daily flonase daily; atrovent nasal spray twice daily and has duoneb every 6 hours as needed  5. Chronic constipation: is stable will continue senna s 2 tabs twice daily   6. GERD without esophagitis: is stable will continue protonix 40 mg daily  7. Parkinson's disease: is stable will continue sinemet 25/250 mg two  times daily   8. Osteoarthritis involving multiple joints of both sides of body  has spinal stenosis of lumbar region with radiculopathy:  is stable will continue  vicodin 5/325 mg every 8 hours and every 6 hours as needed  9. Nocturia more than twice per night: is stable will conitnue flomax 0.4 mg daily   10. Major depression recurrent, chronic: is stable will continue pristiq 50 mg daily     MD is aware of resident's narcotic use and is in agreement with current plan of care. We will attempt to wean resident as apropriate   Synthia Innocent NP [facility]  Contact [phone] Monday through Friday 8am- 5pm  After hours call [phone]

## 2018-11-25 DIAGNOSIS — F339 Major depressive disorder, recurrent, unspecified: Secondary | ICD-10-CM | POA: Insufficient documentation

## 2018-11-26 ENCOUNTER — Other Ambulatory Visit: Payer: Self-pay | Admitting: Adult Health

## 2018-11-26 MED ORDER — LORAZEPAM 0.5 MG PO TABS: 0.5000 mg | ORAL_TABLET | Freq: Two times a day (BID) | ORAL | 0 refills | Status: AC | PRN

## 2018-12-20 ENCOUNTER — Other Ambulatory Visit
Admission: RE | Admit: 2018-12-20 | Discharge: 2018-12-20 | Disposition: A | Discharge status: Home / Self Care | Payer: Medicare Other | Source: Skilled Nursing Facility | Attending: Adult Health | Admitting: Adult Health

## 2018-12-20 DIAGNOSIS — R350 Frequency of micturition: Secondary | ICD-10-CM | POA: Insufficient documentation

## 2018-12-20 DIAGNOSIS — R410 Disorientation, unspecified: Secondary | ICD-10-CM | POA: Diagnosis not present

## 2018-12-20 LAB — URINALYSIS, COMPLETE (UACMP) WITH MICROSCOPIC
BILIRUBIN URINE: NEGATIVE
Bacteria, UA: NONE SEEN
GLUCOSE, UA: NEGATIVE mg/dL
Hgb urine dipstick: NEGATIVE
Ketones, ur: 5 mg/dL — AB
LEUKOCYTES UA: NEGATIVE
NITRITE: NEGATIVE
Protein, ur: NEGATIVE mg/dL
SPECIFIC GRAVITY, URINE: 1.023 (ref 1.005–1.030)
pH: 6 (ref 5.0–8.0)

## 2018-12-22 LAB — URINE CULTURE: Culture: NO GROWTH

## 2018-12-24 LAB — CUP PACEART REMOTE DEVICE CHECK
Battery Remaining Longevity: 39 mo
Battery Voltage: 2.77 V
Date Time Interrogation Session: 20191114155807
Implantable Lead Implant Date: 20130424
Implantable Lead Location: 753860
Implantable Lead Model: 5076
Implantable Pulse Generator Implant Date: 20130424
Lead Channel Pacing Threshold Amplitude: 0.75 V
Lead Channel Pacing Threshold Pulse Width: 0.4 ms
Lead Channel Setting Pacing Amplitude: 1.5 V
Lead Channel Setting Pacing Pulse Width: 0.4 ms
Lead Channel Setting Sensing Sensitivity: 4 mV
MDC IDC LEAD IMPLANT DT: 20130424
MDC IDC LEAD LOCATION: 753859
MDC IDC MSMT BATTERY IMPEDANCE: 1511 Ohm
MDC IDC MSMT LEADCHNL RA IMPEDANCE VALUE: 544 Ohm
MDC IDC MSMT LEADCHNL RA PACING THRESHOLD AMPLITUDE: 0.5 V
MDC IDC MSMT LEADCHNL RA PACING THRESHOLD PULSEWIDTH: 0.4 ms
MDC IDC MSMT LEADCHNL RV IMPEDANCE VALUE: 626 Ohm
MDC IDC SET LEADCHNL RV PACING AMPLITUDE: 2.5 V
MDC IDC STAT BRADY AP VP PERCENT: 32 %
MDC IDC STAT BRADY AP VS PERCENT: 0 %
MDC IDC STAT BRADY AS VP PERCENT: 65 %
MDC IDC STAT BRADY AS VS PERCENT: 3 %

## 2018-12-26 ENCOUNTER — Encounter: Payer: Self-pay | Admitting: Adult Health

## 2018-12-26 ENCOUNTER — Non-Acute Institutional Stay (SKILLED_NURSING_FACILITY): Payer: Medicare Other | Admitting: Adult Health

## 2018-12-26 DIAGNOSIS — J309 Allergic rhinitis, unspecified: Secondary | ICD-10-CM

## 2018-12-26 DIAGNOSIS — K5909 Other constipation: Secondary | ICD-10-CM

## 2018-12-26 DIAGNOSIS — G20 Parkinson's disease: Secondary | ICD-10-CM

## 2018-12-26 DIAGNOSIS — K219 Gastro-esophageal reflux disease without esophagitis: Secondary | ICD-10-CM

## 2018-12-26 NOTE — Progress Notes (Signed)
[facility]   [facility] Room Number: 336 P Place of Service:  SNF (31)   CODE STATUS: DNR  Allergies  Allergen Reactions  . Citalopram     Other reaction(s): Unknown  . Codeine Nausea And Vomiting  . Sertraline     Other reaction(s): Unknown Other reaction(s): Unknown  . Lodine [Etodolac] Rash  . Percocet [Oxycodone-Acetaminophen] Rash    Tolerates tylenol Patient is able to take this recently    Chief Complaint  Patient presents with  . Medical Management of Chronic Issues    Chronic allergic rhinitis; chronic constipation; gastroesophageal reflex disease without esophagitis; parkinson's disease.     HPI:  He is a 85 year old long term resident of this facility being seen for the management of his chronic illnesses: allergic rhinitis; constipation; gerd; parkinsons disease. There are reports that he is having more confusion and more difficulty finding his correct words. There are no reports of fevers; no changes in appetite; no insomnia.   Past Medical History:  Diagnosis Date  . Anxiety   . Arthritis   . Blood transfusion    hx of autologus transfusion  . Cataract cortical, senile   . Chicken pox   . Clotting disorder (HCC)   . Complete heart block (HCC)   . Depression    unspecified  . DVT (deep venous thrombosis) (HCC) 09/2014   left leg  . GERD (gastroesophageal reflux disease)   . Hereditary and idiopathic neuropathy   . History of depression   . Hypertension   . MCI (mild cognitive impairment) 10/22/2013  . Memory deficit 03/24/2011  . Memory loss   . Movement disorder   . Neuromuscular disorder (HCC)    peripheral  neuropathy  . Neuropathy   . Osteoarthritis   . Other hammer toe(s) (acquired), unspecified foot   . Pacemaker   . Parkinson's disease (HCC)   . Parkinsonism (HCC) 10/22/2013  . Shortness of breath   . Spinal stenosis   . Synovitis and tenosynovitis, unspecified   . Third degree heart block (HCC)   . Tremor      Past Surgical History:  Procedure Laterality Date  . CATARACT EXTRACTION  06/2005  . COLONOSCOPY  06/13/2006   PH colon polyps (MUS) repeat 7/20  . CORRECTION HAMMER TOE    . defibrilator  04/04/2012   Dual Chamber  . INSERT / REPLACE / REMOVE PACEMAKER  04/04/2012  . JOINT REPLACEMENT Right    THA  . JOINT REPLACEMENT Bilateral    TKA  . KNEE ARTHROSCOPY Bilateral    Right x 2 and Left  . KNEE SURGERY  [phone]  . LEAD REVISION N/A 04/05/2012   Procedure: LEAD REVISION;  Surgeon: Duke Salvia, MD;  Location: [facility] CATH LAB;  Service: Cardiovascular;  Laterality: N/A;  . PERMANENT PACEMAKER INSERTION N/A 04/04/2012   Procedure: PERMANENT PACEMAKER INSERTION;  Surgeon: Marinus Maw, MD;  Location: [facility] CATH LAB;  Service: Cardiovascular;  Laterality: N/A;  . REPLACEMENT TOTAL HIP W/  RESURFACING IMPLANTS    . REPLACEMENT TOTAL KNEE BILATERAL  1997  . ROTATOR CUFF REPAIR  06/2008  . ROTATOR CUFF REPAIR Left 2010  . SHOULDER ARTHROSCOPY Left 06/19/2008   WITH SUBACROMIAL DECOMPRESSION. DISTAL CLAVICLE EXCISION. ARTHROSCOPIC ROTATOR CUFF REPAIR.  Marland Kitchen TOTAL HIP ARTHROPLASTY  2009   right  . UMBILICAL HERNIA REPAIR  2001  . VASECTOMY      Social History   Socioeconomic History  . Marital status: Married  Spouse name: Polly  . Number of children: 3  . Years of education: College  . Highest education level: Not on file  Occupational History  . Occupation: Retired  Engineer, production  . Financial resource strain: Not on file  . Food insecurity:    Worry: Not on file    Inability: Not on file  . Transportation needs:    Medical: Not on file    Non-medical: Not on file  Tobacco Use  . Smoking status: Former Smoker    Packs/day: 1.00    Years: 25.00    Pack years: 25.00    Types: Cigarettes    Last attempt to quit: 06/24/1973    Years since quitting: 45.5  . Smokeless tobacco: Never Used  Substance and Sexual Activity  . Alcohol use: Not Currently    Comment:  occasional  . Drug use: No  . Sexual activity: Not Currently  Lifestyle  . Physical activity:    Days per week: Not on file    Minutes per session: Not on file  . Stress: Not on file  Relationships  . Social connections:    Talks on phone: Not on file    Gets together: Not on file    Attends religious service: Not on file    Active member of club or organization: Not on file    Attends meetings of clubs or organizations: Not on file    Relationship status: Not on file  . Intimate partner violence:    Fear of current or ex partner: Not on file    Emotionally abused: Not on file    Physically abused: Not on file    Forced sexual activity: Not on file  Other Topics Concern  . Not on file  Social History Narrative   Patient lives at home with spouse.   Caffeine Use: 2 cups of coffee daily      Admitted to [facility] 11/18/2015   Married    3 children   Former smoker   Occasional drinker   DNR   Family History  Problem Relation Age of Onset  . Hypertension Mother   . Heart disease Mother   . Hypertension Father   . Seizures Father        epilepsy  . Heart disease Father   . Prostate cancer Other   . Cancer Other   . Hypertension Other   . Asthma Other   . Hypertension Brother   . Breast cancer Daughter       VITAL SIGNS BP 120/72   Pulse 76   Temp 98.9 F (37.2 C)   Resp 20   Ht 5\' 10"  (1.778 m)   Wt 186 lb 4.8 oz (84.5 kg)   SpO2 98%   BMI 26.73 kg/m   Outpatient Encounter Medications as of 12/26/2018  Medication Sig  . acetaminophen (TYLENOL) 325 MG tablet Take 650 mg by mouth 4 (four) times daily as needed. Max of 3000 mg of Apap in 24 hrs  . alum & mag hydroxide-simeth (MAALOX PLUS) 400-400-40 MG/5ML suspension Take 30 mLs by mouth every 4 (four) hours as needed for indigestion.  . bisacodyl (BISCOLAX) 10 MG suppository Place 10 mg rectally 2 (two) times daily as needed for moderate constipation. Give until BM.  . carbidopa-levodopa (SINEMET  IR) 25-250 MG tablet Take 1 tablet by mouth 2 (two) times daily.   . cetirizine (ZYRTEC) 10 MG tablet Take 10 mg by mouth at bedtime.  Marland Kitchen desvenlafaxine (PRISTIQ) 50  MG 24 hr tablet Take 50 mg by mouth every other day.  . fluticasone (FLONASE) 50 MCG/ACT nasal spray Place 1 spray into both nostrils daily.   Marland Kitchen. guaiFENesin (ROBITUSSIN) 100 MG/5ML liquid Take 200 mg by mouth every 4 (four) hours as needed for cough. Notify provider if cough worsens/ continues longer than 3 days.  Marland Kitchen. ipratropium (ATROVENT) 0.03 % nasal spray Place 2 sprays into both nostrils every 12 (twelve) hours.  Marland Kitchen. ipratropium-albuterol (DUONEB) 0.5-2.5 (3) MG/3ML SOLN Take 3 mLs by nebulization every 6 (six) hours as needed (wheezing and shortness of breath with cough).  Marland Kitchen. lisinopril (PRINIVIL,ZESTRIL) 40 MG tablet TAKE ONE TABLET BY MOUTH EVERY DAY  . LORazepam (ATIVAN) 0.5 MG tablet Take 0.5 mg by mouth every 12 (twelve) hours as needed for anxiety.  . magnesium hydroxide (MILK OF MAGNESIA) 400 MG/5ML suspension Take 30 mLs by mouth every 4 (four) hours as needed. If constipation/ no BM for 2 days  . Melatonin 3 MG TABS Take 1 tablet by mouth at bedtime. Should take 1 - 2 hours before sleep  . NON FORMULARY Diet Type: Regular diet  . ondansetron (ZOFRAN) 4 MG tablet Take 4 mg by mouth every 6 (six) hours as needed for nausea or vomiting.  . OXYGEN Inhale 2 L into the lungs as needed. Check O2 sat prior to placing on patient and at least every 4 hours after applying. Notify MD if O2 sat drop below baseline while receiving oxygen or no improvement in dyspnea  . OXYGEN Inhale 2 L into the lungs at bedtime. For sleep apnea like behavior  . pantoprazole (PROTONIX) 40 MG tablet Take 40 mg by mouth daily.  . Rivaroxaban (XARELTO) 15 MG TABS tablet Take 15 mg by mouth daily with supper. for anticoagulation. Monitor for bleeding/bruising.  . sennosides-docusate sodium (SENOKOT-S) 8.6-50 MG tablet Take 2 tablets by mouth 2 (two) times daily.    . sodium phosphate (FLEET) enema Place 1 enema rectally every three (3) days as needed. If constipation not relieved by milk of magnesia or bisacodyl suppository. Follow package directions  . sorbitol 70 % solution Take 30 mLs by mouth every 2 (two) hours as needed. Constipation. Give PO Q 2 hours until pt has large BM. Give first dose now along with Bisacodyl suppository  . tamsulosin (FLOMAX) 0.4 MG CAPS capsule Take 0.4 mg by mouth daily after supper. 30 mins after supper  . Zinc Oxide 10 % OINT Apply liberal amount topically to areas of skin irritation as needed. Ok to leave at bedside.  . [DISCONTINUED] HYDROcodone-acetaminophen (NORCO) 5-325 MG tablet Take 1 tab every 8 hours routinely and every 6 hours as needed for pain.   No facility-administered encounter medications on file as of 12/26/2018.      SIGNIFICANT DIAGNOSTIC EXAMS  LABS REVIEWED PREVIOUS:   03-12-18: wbc 5.5; hgb 13.1; hct 38.6; mcv 99.4; plt 137; glucose 115; bun 18; creat 0.92; k+ 3.9; na++ 138; ca 9.1; liver normal albumin 4.2; vit B 12: 615;  05-03-18: urine culture: no growth  08-24-18: urine culture: none 09-13-18: wbc  5.4; hgb 12.6; hc6 37.9; mcv 103.3; plt 147; glucose 116; bun 20; creat 1.12; k+ 4.4; na++ 41; ca 9.0; liver normal albumin 4.2 tsh 1.231 10-08-18:  Urine culture: multiple species 10-13-18: urine culture: <10,000 colonies.  11-03-18: urine culture: no growth 11-07-18: urine culture: <10,000  TODAY:   12-20-18: urine culture: no growth     Review of Systems  Constitutional: Negative for malaise/fatigue.  Respiratory:  Negative for cough and shortness of breath.   Cardiovascular: Negative for chest pain, palpitations and leg swelling.  Gastrointestinal: Negative for abdominal pain, constipation and heartburn.  Musculoskeletal: Negative for back pain, joint pain and myalgias.  Skin: Negative.   Neurological: Negative for dizziness.       Is having increasing confusion Increased difficulty  finding words  Psychiatric/Behavioral: The patient is not nervous/anxious.       Physical Exam Constitutional:      General: He is not in acute distress.    Appearance: He is well-developed. He is not diaphoretic.  Neck:     Musculoskeletal: Neck supple.     Thyroid: No thyromegaly.  Cardiovascular:     Rate and Rhythm: Normal rate and regular rhythm.     Pulses: Normal pulses.     Heart sounds: Normal heart sounds.     Comments: Has pace maker Pulmonary:     Effort: Pulmonary effort is normal. No respiratory distress.     Breath sounds: Normal breath sounds.     Comments: 02 2L at HS Abdominal:     General: Bowel sounds are normal. There is no distension.     Palpations: Abdomen is soft.     Tenderness: There is no abdominal tenderness.  Musculoskeletal:     Right lower leg: No edema.     Left lower leg: No edema.     Comments:  Is able to move all extremities  Has history of knee and hip replacements    Lymphadenopathy:     Cervical: No cervical adenopathy.  Skin:    General: Skin is warm and dry.  Neurological:     Mental Status: He is alert and oriented to person, place, and time.  Psychiatric:        Mood and Affect: Mood normal.     ASSESSMENT/ PLAN:  TODAY:   1. Chronic allergic rhinitis: is stable will continue zyrtec 10 mg daily flonase daily; atrovent nasal spray twice daily and has duoneb every 6 hours as needed  2. Chronic constipation: is stable will continue senna s 2 tabs twice daily   3. GERD without esophagitis: is stable will continue protonix 40 mg daily  4. Parkinson's disease: is stable will continue sinemet 25/250 mg two  times daily   PREVIOUS   5. Osteoarthritis involving multiple joints of both sides of body has spinal stenosis of lumbar region with radiculopathy:  is stable will monitor  6. Nocturia more than twice per night: is stable will conitnue flomax 0.4 mg daily   7. Major depression recurrent, chronic: is stable will  continue pristiq 50 mg daily    8. Chronic deep vein thrombosis (DVT) of proximal vein of left lower extremity: is stable is on long term xarelto 15 mg daily   9. Complete heart block: is stable is status post pace maker insertion  10. Essential hypertension: is stable:b/p 120/72 will continue lisinopril 40 mg daily   11. Peripheral neuropathy: is stable is currently off neurontin will monitor    Will check cbc; cmp; will have speech therapy evaluate and treat as indicated for cognition and expressive aphasia.    MD is aware of resident's narcotic use and is in agreement with current plan of care. We will attempt to wean resident as apropriate   Synthia Innocenteborah Ludmila Ebarb NP [facility]  Contact [phone] Monday through Friday 8am- 5pm  After hours call [phone]

## 2018-12-27 ENCOUNTER — Other Ambulatory Visit: Payer: Self-pay | Admitting: Adult Health

## 2018-12-27 ENCOUNTER — Encounter: Payer: Self-pay | Admitting: Adult Health

## 2018-12-27 ENCOUNTER — Other Ambulatory Visit
Admission: RE | Admit: 2018-12-27 | Discharge: 2018-12-27 | Disposition: A | Discharge status: Home / Self Care | Payer: Medicare Other | Source: Ambulatory Visit | Attending: Adult Health | Admitting: Adult Health

## 2018-12-27 ENCOUNTER — Telehealth: Payer: Self-pay

## 2018-12-27 DIAGNOSIS — I10 Essential (primary) hypertension: Secondary | ICD-10-CM | POA: Diagnosis present

## 2018-12-27 LAB — CBC WITH DIFFERENTIAL/PLATELET
ABS IMMATURE GRANULOCYTES: 0.02 10*3/uL (ref 0.00–0.07)
Basophils Absolute: 0 10*3/uL (ref 0.0–0.1)
Basophils Relative: 1 %
Eosinophils Absolute: 0.4 10*3/uL (ref 0.0–0.5)
Eosinophils Relative: 8 %
HCT: 40.4 % (ref 39.0–52.0)
HEMOGLOBIN: 12.8 g/dL — AB (ref 13.0–17.0)
IMMATURE GRANULOCYTES: 0 %
LYMPHS PCT: 43 %
Lymphs Abs: 2.5 10*3/uL (ref 0.7–4.0)
MCH: 34 pg (ref 26.0–34.0)
MCHC: 31.7 g/dL (ref 30.0–36.0)
MCV: 107.4 fL — ABNORMAL HIGH (ref 80.0–100.0)
MONO ABS: 0.3 10*3/uL (ref 0.1–1.0)
MONOS PCT: 5 %
NEUTROS ABS: 2.6 10*3/uL (ref 1.7–7.7)
Neutrophils Relative %: 43 %
Platelets: 137 10*3/uL — ABNORMAL LOW (ref 150–400)
RBC: 3.76 MIL/uL — ABNORMAL LOW (ref 4.22–5.81)
RDW: 12.9 % (ref 11.5–15.5)
WBC: 5.9 10*3/uL (ref 4.0–10.5)
nRBC: 0 % (ref 0.0–0.2)

## 2018-12-27 LAB — COMPREHENSIVE METABOLIC PANEL
ALBUMIN: 3.9 g/dL (ref 3.5–5.0)
ALK PHOS: 64 U/L (ref 38–126)
ALT: 8 U/L (ref 0–44)
AST: 14 U/L — AB (ref 15–41)
Anion gap: 5 (ref 5–15)
BUN: 20 mg/dL (ref 8–23)
CO2: 27 mmol/L (ref 22–32)
CREATININE: 0.91 mg/dL (ref 0.61–1.24)
Calcium: 8.8 mg/dL — ABNORMAL LOW (ref 8.9–10.3)
Chloride: 107 mmol/L (ref 98–111)
GFR calc non Af Amer: >60 mL/min (ref >60)
GLUCOSE: 101 mg/dL — AB (ref 70–99)
Potassium: 4.8 mmol/L (ref 3.5–5.1)
SODIUM: 139 mmol/L (ref 135–145)
Total Bilirubin: 0.9 mg/dL (ref 0.3–1.2)
Total Protein: 6.2 g/dL — ABNORMAL LOW (ref 6.5–8.1)

## 2018-12-27 MED ORDER — HYDROCODONE-ACETAMINOPHEN 5-325 MG PO TABS: ORAL_TABLET | ORAL | 0 refills | Status: DC

## 2018-12-27 NOTE — Telephone Encounter (Signed)
Received call from Bri at [facility] stating they need clarification on an RX that was sent in today for Hydrocodone.

## 2018-12-31 ENCOUNTER — Encounter: Payer: Self-pay | Admitting: Adult Health

## 2018-12-31 ENCOUNTER — Non-Acute Institutional Stay (SKILLED_NURSING_FACILITY): Payer: Medicare Other | Admitting: Adult Health

## 2018-12-31 DIAGNOSIS — G20 Parkinson's disease: Secondary | ICD-10-CM

## 2018-12-31 DIAGNOSIS — F339 Major depressive disorder, recurrent, unspecified: Secondary | ICD-10-CM | POA: Diagnosis not present

## 2018-12-31 NOTE — Progress Notes (Signed)
Location:   [facility] Room Number: 336 P Place of Service:  SNF (31)   CODE STATUS: DNR  Allergies  Allergen Reactions  . Citalopram     Other reaction(s): Unknown  . Codeine Nausea And Vomiting  . Sertraline     Other reaction(s): Unknown Other reaction(s): Unknown  . Lodine [Etodolac] Rash  . Percocet [Oxycodone-Acetaminophen] Rash    Tolerates tylenol Patient is able to take this recently    Chief Complaint  Patient presents with  . Acute Visit    Inceased Aggitation    HPI:  Staff reports that he is experiencing increased agitation. He tells me that he is more anxious and is having increased worrying. He does have some insomnia. There are no reports of changes in appetite.    Past Medical History:  Diagnosis Date  . Anxiety   . Arthritis   . Blood transfusion    hx of autologus transfusion  . Cataract cortical, senile   . Chicken pox   . Clotting disorder (HCC)   . Complete heart block (HCC)   . Depression    unspecified  . DVT (deep venous thrombosis) (HCC) 09/2014   left leg  . GERD (gastroesophageal reflux disease)   . Hereditary and idiopathic neuropathy   . History of depression   . Hypertension   . MCI (mild cognitive impairment) 10/22/2013  . Memory deficit 03/24/2011  . Memory loss   . Movement disorder   . Neuromuscular disorder (HCC)    peripheral  neuropathy  . Neuropathy   . Osteoarthritis   . Other hammer toe(s) (acquired), unspecified foot   . Pacemaker   . Parkinson's disease (HCC)   . Parkinsonism (HCC) 10/22/2013  . Shortness of breath   . Spinal stenosis   . Synovitis and tenosynovitis, unspecified   . Third degree heart block (HCC)   . Tremor     Past Surgical History:  Procedure Laterality Date  . CATARACT EXTRACTION  06/2005  . COLONOSCOPY  06/13/2006   PH colon polyps (MUS) repeat 7/20  . CORRECTION HAMMER TOE    . defibrilator  04/04/2012   Dual Chamber  . INSERT / REPLACE / REMOVE  PACEMAKER  04/04/2012  . JOINT REPLACEMENT Right    THA  . JOINT REPLACEMENT Bilateral    TKA  . KNEE ARTHROSCOPY Bilateral    Right x 2 and Left  . KNEE SURGERY  [phone]  . LEAD REVISION N/A 04/05/2012   Procedure: LEAD REVISION;  Surgeon: Duke Salvia, MD;  Location: [facility] CATH LAB;  Service: Cardiovascular;  Laterality: N/A;  . PERMANENT PACEMAKER INSERTION N/A 04/04/2012   Procedure: PERMANENT PACEMAKER INSERTION;  Surgeon: Marinus Maw, MD;  Location: [facility] CATH LAB;  Service: Cardiovascular;  Laterality: N/A;  . REPLACEMENT TOTAL HIP W/  RESURFACING IMPLANTS    . REPLACEMENT TOTAL KNEE BILATERAL  1997  . ROTATOR CUFF REPAIR  06/2008  . ROTATOR CUFF REPAIR Left 2010  . SHOULDER ARTHROSCOPY Left 06/19/2008   WITH SUBACROMIAL DECOMPRESSION. DISTAL CLAVICLE EXCISION. ARTHROSCOPIC ROTATOR CUFF REPAIR.  Marland Kitchen TOTAL HIP ARTHROPLASTY  2009   right  . UMBILICAL HERNIA REPAIR  2001  . VASECTOMY      Social History   Socioeconomic History  . Marital status: Married    Spouse name: Glena Norfolk  . Number of children: 3  . Years of education: College  . Highest education level: Not on file  Occupational History  . Occupation: Retired  Chief Executive Officer  Needs  . Financial resource strain: Not on file  . Food insecurity:    Worry: Not on file    Inability: Not on file  . Transportation needs:    Medical: Not on file    Non-medical: Not on file  Tobacco Use  . Smoking status: Former Smoker    Packs/day: 1.00    Years: 25.00    Pack years: 25.00    Types: Cigarettes    Last attempt to quit: 06/24/1973    Years since quitting: 45.5  . Smokeless tobacco: Never Used  Substance and Sexual Activity  . Alcohol use: Not Currently    Comment: occasional  . Drug use: No  . Sexual activity: Not Currently  Lifestyle  . Physical activity:    Days per week: Not on file    Minutes per session: Not on file  . Stress: Not on file  Relationships  . Social connections:    Talks on phone: Not on file     Gets together: Not on file    Attends religious service: Not on file    Active member of club or organization: Not on file    Attends meetings of clubs or organizations: Not on file    Relationship status: Not on file  . Intimate partner violence:    Fear of current or ex partner: Not on file    Emotionally abused: Not on file    Physically abused: Not on file    Forced sexual activity: Not on file  Other Topics Concern  . Not on file  Social History Narrative   Patient lives at home with spouse.   Caffeine Use: 2 cups of coffee daily      Admitted to [facility] 11/18/2015   Married    3 children   Former smoker   Occasional drinker   DNR   Family History  Problem Relation Age of Onset  . Hypertension Mother   . Heart disease Mother   . Hypertension Father   . Seizures Father        epilepsy  . Heart disease Father   . Prostate cancer Other   . Cancer Other   . Hypertension Other   . Asthma Other   . Hypertension Brother   . Breast cancer Daughter       VITAL SIGNS BP 120/72   Pulse 76   Temp 98.9 F (37.2 C)   Resp 20   Ht 5\' 10"  (1.778 m)   Wt 188 lb 6.4 oz (85.5 kg)   SpO2 98%   BMI 27.03 kg/m   Outpatient Encounter Medications as of 12/31/2018  Medication Sig  . acetaminophen (TYLENOL) 325 MG tablet Take 650 mg by mouth 4 (four) times daily as needed. Max of 3000 mg of Apap in 24 hrs  . alum & mag hydroxide-simeth (MAALOX PLUS) 400-400-40 MG/5ML suspension Take 30 mLs by mouth every 4 (four) hours as needed for indigestion.  . bisacodyl (BISCOLAX) 10 MG suppository Place 10 mg rectally 2 (two) times daily as needed for moderate constipation. Give until BM.  . carbidopa-levodopa (SINEMET IR) 25-250 MG tablet Take 1 tablet by mouth 2 (two) times daily.   . cetirizine (ZYRTEC) 10 MG tablet Take 10 mg by mouth at bedtime.  Marland Kitchen desvenlafaxine (PRISTIQ) 50 MG 24 hr tablet Take 50 mg by mouth every other day.  . fluticasone (FLONASE) 50 MCG/ACT nasal  spray Place 1 spray into both nostrils daily.   Marland Kitchen guaiFENesin (ROBITUSSIN)  100 MG/5ML liquid Take 200 mg by mouth every 4 (four) hours as needed for cough. Notify provider if cough worsens/ continues longer than 3 days.  Marland Kitchen. HYDROcodone-acetaminophen (NORCO) 5-325 MG tablet Take 1 tab every 8 hours routinely and every 6 hours as needed for pain.  Marland Kitchen. ipratropium (ATROVENT) 0.03 % nasal spray Place 2 sprays into both nostrils every 12 (twelve) hours.  Marland Kitchen. ipratropium-albuterol (DUONEB) 0.5-2.5 (3) MG/3ML SOLN Take 3 mLs by nebulization every 6 (six) hours as needed (wheezing and shortness of breath with cough).  Marland Kitchen. lisinopril (PRINIVIL,ZESTRIL) 40 MG tablet TAKE ONE TABLET BY MOUTH EVERY DAY  . LORazepam (ATIVAN) 0.5 MG tablet Take 0.5 mg by mouth every 12 (twelve) hours as needed for anxiety.  . magnesium hydroxide (MILK OF MAGNESIA) 400 MG/5ML suspension Take 30 mLs by mouth every 4 (four) hours as needed. If constipation/ no BM for 2 days  . Melatonin 3 MG TABS Take 1 tablet by mouth at bedtime. Should take 1 - 2 hours before sleep  . NON FORMULARY Diet Type: Regular diet  . ondansetron (ZOFRAN) 4 MG tablet Take 4 mg by mouth every 6 (six) hours as needed for nausea or vomiting.  . OXYGEN Inhale 2 L into the lungs as needed. Check O2 sat prior to placing on patient and at least every 4 hours after applying. Notify MD if O2 sat drop below baseline while receiving oxygen or no improvement in dyspnea  . OXYGEN Inhale 2 L into the lungs at bedtime. For sleep apnea like behavior  . pantoprazole (PROTONIX) 40 MG tablet Take 40 mg by mouth daily.  . Rivaroxaban (XARELTO) 15 MG TABS tablet Take 15 mg by mouth daily with supper. for anticoagulation. Monitor for bleeding/bruising.  . sennosides-docusate sodium (SENOKOT-S) 8.6-50 MG tablet Take 2 tablets by mouth 2 (two) times daily.   . sodium phosphate (FLEET) enema Place 1 enema rectally every three (3) days as needed. If constipation not relieved by milk of  magnesia or bisacodyl suppository. Follow package directions  . sorbitol 70 % solution Take 30 mLs by mouth every 2 (two) hours as needed. Constipation. Give PO Q 2 hours until pt has large BM. Give first dose now along with Bisacodyl suppository  . tamsulosin (FLOMAX) 0.4 MG CAPS capsule Take 0.4 mg by mouth daily after supper. 30 mins after supper  . Zinc Oxide 10 % OINT Apply liberal amount topically to areas of skin irritation as needed. Ok to leave at bedside.   No facility-administered encounter medications on file as of 12/31/2018.      SIGNIFICANT DIAGNOSTIC EXAMS  LABS REVIEWED PREVIOUS:   03-12-18: wbc 5.5; hgb 13.1; hct 38.6; mcv 99.4; plt 137; glucose 115; bun 18; creat 0.92; k+ 3.9; na++ 138; ca 9.1; liver normal albumin 4.2; vit B 12: 615;  05-03-18: urine culture: no growth  08-24-18: urine culture: none 09-13-18: wbc  5.4; hgb 12.6; hc6 37.9; mcv 103.3; plt 147; glucose 116; bun 20; creat 1.12; k+ 4.4; na++ 41; ca 9.0; liver normal albumin 4.2 tsh 1.231 10-08-18:  Urine culture: multiple species 10-13-18: urine culture: <10,000 colonies.  11-03-18: urine culture: no growth 11-07-18: urine culture: <10,000 12-20-18: urine culture: no growth  NO NEW LABS.     Review of Systems  Constitutional: Negative for malaise/fatigue.  Respiratory: Negative for cough and shortness of breath.   Cardiovascular: Negative for chest pain, palpitations and leg swelling.  Gastrointestinal: Negative for abdominal pain, constipation and heartburn.  Musculoskeletal: Negative for back pain,  joint pain and myalgias.  Skin: Negative.   Neurological: Negative for dizziness.  Psychiatric/Behavioral: Positive for depression. The patient is nervous/anxious and has insomnia.     Physical Exam Constitutional:      General: He is not in acute distress.    Appearance: He is well-developed. He is not diaphoretic.  Neck:     Musculoskeletal: Neck supple.     Thyroid: No thyromegaly.  Cardiovascular:      Rate and Rhythm: Normal rate and regular rhythm.     Pulses: Normal pulses.     Heart sounds: Normal heart sounds.     Comments: Has pace maker  Pulmonary:     Effort: Pulmonary effort is normal. No respiratory distress.     Breath sounds: Normal breath sounds.     Comments: 02 at HS  Abdominal:     General: Bowel sounds are normal. There is no distension.     Palpations: Abdomen is soft.     Tenderness: There is no abdominal tenderness.  Musculoskeletal:     Right lower leg: No edema.     Left lower leg: No edema.     Comments: Is able to move all extremities  Has history of knee and hip replacements     Lymphadenopathy:     Cervical: No cervical adenopathy.  Skin:    General: Skin is warm and dry.  Neurological:     Mental Status: He is alert and oriented to person, place, and time.  Psychiatric:     Comments: Has depressed affect       ASSESSMENT/ PLAN:  TODAY:   1.Parkinson's disease: 2.  Major depression, recurrent, chronic is worse  Will increase his pristiq to 50 mg daily and will monitor his status.       MD is aware of resident's narcotic use and is in agreement with current plan of care. We will attempt to wean resident as apropriate   Synthia Innocenteborah Marivel Mcclarty NP [facility]  Contact [phone] Monday through Friday 8am- 5pm  After hours call [phone]

## 2019-01-02 ENCOUNTER — Non-Acute Institutional Stay (SKILLED_NURSING_FACILITY): Payer: Medicare Other | Admitting: Adult Health

## 2019-01-02 ENCOUNTER — Encounter: Payer: Self-pay | Admitting: Adult Health

## 2019-01-02 DIAGNOSIS — G20 Parkinson's disease: Secondary | ICD-10-CM

## 2019-01-02 DIAGNOSIS — M159 Polyosteoarthritis, unspecified: Secondary | ICD-10-CM | POA: Diagnosis not present

## 2019-01-02 NOTE — Progress Notes (Signed)
[facility]   [facility] Room Number: 336 P Place of Service:  SNF (31)   CODE STATUS: DNR  Allergies  Allergen Reactions  . Citalopram     Other reaction(s): Unknown  . Codeine Nausea And Vomiting  . Sertraline     Other reaction(s): Unknown Other reaction(s): Unknown  . Lodine [Etodolac] Rash  . Percocet [Oxycodone-Acetaminophen] Rash    Tolerates tylenol Patient is able to take this recently    Chief Complaint  Patient presents with  . Acute Visit    Care Plan Meeting    HPI:  We have come together for his routine care plan meeting. He does have family present. He is cognitively intact. He does state that he has low energy an dis moving slower. His appetite is good and he is sleeping good at night. His weight has been stable over the past 6 months. He has shoulder knee and hip pain which is mild. He is declining globally. We have reviewed his advanced directives. His family has been given a MOST form; we have discussed his code status which is DNR; this has been explained. We discussed hospitalizations; abt; ivf and tube feeding. A this time his family would like to discuss this; make decisions and return the MOST form; have verbalized understanding.    Past Medical History:  Diagnosis Date  . Anxiety   . Arthritis   . Blood transfusion    hx of autologus transfusion  . Cataract cortical, senile   . Chicken pox   . Clotting disorder (HCC)   . Complete heart block (HCC)   . Depression    unspecified  . DVT (deep venous thrombosis) (HCC) 09/2014   left leg  . GERD (gastroesophageal reflux disease)   . Hereditary and idiopathic neuropathy   . History of depression   . Hypertension   . MCI (mild cognitive impairment) 10/22/2013  . Memory deficit 03/24/2011  . Memory loss   . Movement disorder   . Neuromuscular disorder (HCC)    peripheral  neuropathy  . Neuropathy   . Osteoarthritis   . Other hammer toe(s) (acquired), unspecified  foot   . Pacemaker   . Parkinson's disease (HCC)   . Parkinsonism (HCC) 10/22/2013  . Shortness of breath   . Spinal stenosis   . Synovitis and tenosynovitis, unspecified   . Third degree heart block (HCC)   . Tremor     Past Surgical History:  Procedure Laterality Date  . CATARACT EXTRACTION  06/2005  . COLONOSCOPY  06/13/2006   PH colon polyps (MUS) repeat 7/20  . CORRECTION HAMMER TOE    . defibrilator  04/04/2012   Dual Chamber  . INSERT / REPLACE / REMOVE PACEMAKER  04/04/2012  . JOINT REPLACEMENT Right    THA  . JOINT REPLACEMENT Bilateral    TKA  . KNEE ARTHROSCOPY Bilateral    Right x 2 and Left  . KNEE SURGERY  [phone]  . LEAD REVISION N/A 04/05/2012   Procedure: LEAD REVISION;  Surgeon: Duke Salvia, MD;  Location: [facility] CATH LAB;  Service: Cardiovascular;  Laterality: N/A;  . PERMANENT PACEMAKER INSERTION N/A 04/04/2012   Procedure: PERMANENT PACEMAKER INSERTION;  Surgeon: Marinus Maw, MD;  Location: [facility] CATH LAB;  Service: Cardiovascular;  Laterality: N/A;  . REPLACEMENT TOTAL HIP W/  RESURFACING IMPLANTS    . REPLACEMENT TOTAL KNEE BILATERAL  1997  . ROTATOR CUFF REPAIR  06/2008  . ROTATOR CUFF REPAIR Left 2010  .  SHOULDER ARTHROSCOPY Left 06/19/2008   WITH SUBACROMIAL DECOMPRESSION. DISTAL CLAVICLE EXCISION. ARTHROSCOPIC ROTATOR CUFF REPAIR.  Marland Kitchen. TOTAL HIP ARTHROPLASTY  2009   right  . UMBILICAL HERNIA REPAIR  2001  . VASECTOMY      Social History   Socioeconomic History  . Marital status: Married    Spouse name: Glena Norfolkolly  . Number of children: 3  . Years of education: College  . Highest education level: Not on file  Occupational History  . Occupation: Retired  Engineer, productionocial Needs  . Financial resource strain: Not on file  . Food insecurity:    Worry: Not on file    Inability: Not on file  . Transportation needs:    Medical: Not on file    Non-medical: Not on file  Tobacco Use  . Smoking status: Former Smoker    Packs/day: 1.00    Years: 25.00     Pack years: 25.00    Types: Cigarettes    Last attempt to quit: 06/24/1973    Years since quitting: 45.5  . Smokeless tobacco: Never Used  Substance and Sexual Activity  . Alcohol use: Not Currently    Comment: occasional  . Drug use: No  . Sexual activity: Not Currently  Lifestyle  . Physical activity:    Days per week: Not on file    Minutes per session: Not on file  . Stress: Not on file  Relationships  . Social connections:    Talks on phone: Not on file    Gets together: Not on file    Attends religious service: Not on file    Active member of club or organization: Not on file    Attends meetings of clubs or organizations: Not on file    Relationship status: Not on file  . Intimate partner violence:    Fear of current or ex partner: Not on file    Emotionally abused: Not on file    Physically abused: Not on file    Forced sexual activity: Not on file  Other Topics Concern  . Not on file  Social History Narrative   Patient lives at home with spouse.   Caffeine Use: 2 cups of coffee daily      [facility]   Married    3 children   Former smoker   Occasional drinker   DNR   Family History  Problem Relation Age of Onset  . Hypertension Mother   . Heart disease Mother   . Hypertension Father   . Seizures Father        epilepsy  . Heart disease Father   . Prostate cancer Other   . Cancer Other   . Hypertension Other   . Asthma Other   . Hypertension Brother   . Breast cancer Daughter       VITAL SIGNS Ht 5\' 10"  (1.778 m)   Wt 188 lb 6.4 oz (85.5 kg)   BMI 27.03 kg/m   Outpatient Encounter Medications as of 01/02/2019  Medication Sig  . acetaminophen (TYLENOL) 325 MG tablet Take 650 mg by mouth 4 (four) times daily as needed. Max of 3000 mg of Apap in 24 hrs  . alum & mag hydroxide-simeth (MAALOX PLUS) 400-400-40 MG/5ML suspension Take 30 mLs by mouth every 4 (four) hours as needed for indigestion.  . bisacodyl (BISCOLAX)  10 MG suppository Place 10 mg rectally 2 (two) times daily as needed for moderate constipation. Give until BM.  . carbidopa-levodopa (SINEMET IR)  25-250 MG tablet Take 1 tablet by mouth 2 (two) times daily.   . cetirizine (ZYRTEC) 10 MG tablet Take 10 mg by mouth at bedtime.  Marland Kitchen. desvenlafaxine (PRISTIQ) 50 MG 24 hr tablet Take 50 mg by mouth daily.   . fluticasone (FLONASE) 50 MCG/ACT nasal spray Place 1 spray into both nostrils daily.   Marland Kitchen. guaiFENesin (ROBITUSSIN) 100 MG/5ML liquid Take 200 mg by mouth every 4 (four) hours as needed for cough. Notify provider if cough worsens/ continues longer than 3 days.  Marland Kitchen. HYDROcodone-acetaminophen (NORCO) 5-325 MG tablet Take 1 tab every 8 hours routinely and every 6 hours as needed for pain.  Marland Kitchen. ipratropium (ATROVENT) 0.03 % nasal spray Place 2 sprays into both nostrils every 12 (twelve) hours.  Marland Kitchen. ipratropium-albuterol (DUONEB) 0.5-2.5 (3) MG/3ML SOLN Take 3 mLs by nebulization every 6 (six) hours as needed (wheezing and shortness of breath with cough).  Marland Kitchen. lisinopril (PRINIVIL,ZESTRIL) 40 MG tablet TAKE ONE TABLET BY MOUTH EVERY DAY  . LORazepam (ATIVAN) 0.5 MG tablet Take 0.5 mg by mouth every 12 (twelve) hours as needed for anxiety.  . magnesium hydroxide (MILK OF MAGNESIA) 400 MG/5ML suspension Take 30 mLs by mouth every 4 (four) hours as needed. If constipation/ no BM for 2 days  . Melatonin 3 MG TABS Take 1 tablet by mouth at bedtime. Should take 1 - 2 hours before sleep  . NON FORMULARY Diet Type: Regular diet  . ondansetron (ZOFRAN) 4 MG tablet Take 4 mg by mouth every 6 (six) hours as needed for nausea or vomiting.  . OXYGEN Inhale 2 L into the lungs as needed. Check O2 sat prior to placing on patient and at least every 4 hours after applying. Notify MD if O2 sat drop below baseline while receiving oxygen or no improvement in dyspnea  . OXYGEN Inhale 2 L into the lungs at bedtime. For sleep apnea like behavior  . pantoprazole (PROTONIX) 40 MG tablet Take  40 mg by mouth daily.  . Rivaroxaban (XARELTO) 15 MG TABS tablet Take 15 mg by mouth daily with supper. for anticoagulation. Monitor for bleeding/bruising.  . sennosides-docusate sodium (SENOKOT-S) 8.6-50 MG tablet Take 2 tablets by mouth 2 (two) times daily.   . sodium phosphate (FLEET) enema Place 1 enema rectally every three (3) days as needed. If constipation not relieved by milk of magnesia or bisacodyl suppository. Follow package directions  . sorbitol 70 % solution Take 30 mLs by mouth every 2 (two) hours as needed. Constipation. Give PO Q 2 hours until pt has large BM. Give first dose now along with Bisacodyl suppository  . tamsulosin (FLOMAX) 0.4 MG CAPS capsule Take 0.4 mg by mouth daily after supper. 30 mins after supper  . Zinc Oxide 10 % OINT Apply liberal amount topically to areas of skin irritation as needed. Ok to leave at bedside.   No facility-administered encounter medications on file as of 01/02/2019.      SIGNIFICANT DIAGNOSTIC EXAMS   LABS REVIEWED PREVIOUS:   03-12-18: wbc 5.5; hgb 13.1; hct 38.6; mcv 99.4; plt 137; glucose 115; bun 18; creat 0.92; k+ 3.9; na++ 138; ca 9.1; liver normal albumin 4.2; vit B 12: 615;  05-03-18: urine culture: no growth  08-24-18: urine culture: none 09-13-18: wbc  5.4; hgb 12.6; hc6 37.9; mcv 103.3; plt 147; glucose 116; bun 20; creat 1.12; k+ 4.4; na++ 41; ca 9.0; liver normal albumin 4.2 tsh 1.231 10-08-18:  Urine culture: multiple species 10-13-18: urine culture: <10,000 colonies.  11-03-18:  urine culture: no growth 11-07-18: urine culture: <10,000 12-20-18: urine culture: no growth  NO NEW LABS.     Review of Systems  Constitutional: Negative for malaise/fatigue.  Respiratory: Negative for cough and shortness of breath.   Cardiovascular: Negative for chest pain, palpitations and leg swelling.  Gastrointestinal: Negative for abdominal pain, constipation and heartburn.  Musculoskeletal: Negative for back pain, joint pain and myalgias.    Skin: Negative.   Neurological: Negative for dizziness.  Psychiatric/Behavioral: Positive for depression. The patient is not nervous/anxious.      Physical Exam Constitutional:      General: He is not in acute distress.    Appearance: He is well-developed. He is not diaphoretic.  Neck:     Musculoskeletal: Neck supple.     Thyroid: No thyromegaly.  Cardiovascular:     Rate and Rhythm: Normal rate and regular rhythm.     Pulses: Normal pulses.     Heart sounds: Normal heart sounds.     Comments: Pace maker  Pulmonary:     Effort: Pulmonary effort is normal. No respiratory distress.     Breath sounds: Normal breath sounds.     Comments: 02 at HS  Abdominal:     General: Bowel sounds are normal. There is no distension.     Palpations: Abdomen is soft.     Tenderness: There is no abdominal tenderness.  Musculoskeletal:     Right lower leg: No edema.     Left lower leg: No edema.     Comments:  Is able to move all extremities  Has history of knee and hip replacements      Lymphadenopathy:     Cervical: No cervical adenopathy.  Skin:    General: Skin is warm and dry.  Neurological:     Mental Status: He is alert and oriented to person, place, and time.  Psychiatric:        Mood and Affect: Mood normal.      ASSESSMENT/ PLAN:  TODAY:   1. Parkinson's disease  2. Osteoarthritis involving multiple joints on both sides of body.  He has recently had his pristiq increased Will continue his current plan of care His family will fill out MOST form and return.   Time spent with patient 40 minutes (20 minutes with advanced directives) discussed medications; goals of care and MOST form has verbalized understanding.     MD is aware of resident's narcotic use and is in agreement with current plan of care. We will attempt to wean resident as apropriate   Synthia Innocent NP [facility]  Contact [phone] Monday through Friday 8am- 5pm  After hours call  [phone]

## 2019-01-08 ENCOUNTER — Encounter: Payer: Self-pay | Admitting: Adult Health

## 2019-01-08 ENCOUNTER — Non-Acute Institutional Stay (SKILLED_NURSING_FACILITY): Payer: Medicare Other | Admitting: Adult Health

## 2019-01-08 DIAGNOSIS — F339 Major depressive disorder, recurrent, unspecified: Secondary | ICD-10-CM | POA: Diagnosis not present

## 2019-01-08 NOTE — Progress Notes (Signed)
[facility] Room Number: 336 P Place of Service:  SNF (31)   CODE STATUS: DNR  Allergies  Allergen Reactions  . Citalopram     Other reaction(s): Unknown  . Codeine Nausea And Vomiting  . Sertraline     Other reaction(s): Unknown Other reaction(s): Unknown  . Lodine [Etodolac] Rash  . Percocet [Oxycodone-Acetaminophen] Rash    Tolerates tylenol Patient is able to take this recently    Chief Complaint  Patient presents with  . Acute Visit    Anxiety    HPI:  He is having worse anxiety especially in the evening to night hours. He has been using ativan 0.5 nightly; is on long term pristiq 50 mg daily. He is not sleeping well at night. There are no reports of changes in appetite.   Past Medical History:  Diagnosis Date  . Anxiety   . Arthritis   . Blood transfusion    hx of autologus transfusion  . Cataract cortical, senile   . Chicken pox   . Clotting disorder (HCC)   . Complete heart block (HCC)   . Depression    unspecified  . DVT (deep venous thrombosis) (HCC) 09/2014   left leg  . GERD (gastroesophageal reflux disease)   . Hereditary and idiopathic neuropathy   . History of depression   . Hypertension   . MCI (mild cognitive impairment) 10/22/2013  . Memory deficit 03/24/2011  . Memory loss   . Movement disorder   . Neuromuscular disorder (HCC)    peripheral  neuropathy  . Neuropathy   . Osteoarthritis   . Other hammer toe(s) (acquired), unspecified foot   . Pacemaker   . Parkinson's disease (HCC)   . Parkinsonism (HCC) 10/22/2013  . Shortness of breath   . Spinal stenosis   . Synovitis and tenosynovitis, unspecified   . Third degree heart block (HCC)   . Tremor     Past Surgical History:  Procedure Laterality Date  . CATARACT EXTRACTION  06/2005  . COLONOSCOPY  06/13/2006   PH colon polyps (MUS) repeat 7/20  . CORRECTION HAMMER TOE    . defibrilator  04/04/2012   Dual Chamber  . INSERT / REPLACE /  REMOVE PACEMAKER  04/04/2012  . JOINT REPLACEMENT Right    THA  . JOINT REPLACEMENT Bilateral    TKA  . KNEE ARTHROSCOPY Bilateral    Right x 2 and Left  . KNEE SURGERY  [phone]  . LEAD REVISION N/A 04/05/2012   Procedure: LEAD REVISION;  Surgeon: Duke SalviaSteven C Klein, MD;  [facility];  Service: Cardiovascular;  Laterality: N/A;  . PERMANENT PACEMAKER INSERTION N/A 04/04/2012   Procedure: PERMANENT PACEMAKER INSERTION;  Surgeon: Marinus MawGregg W Taylor, MD;  [facility];  Service: Cardiovascular;  Laterality: N/A;  . REPLACEMENT TOTAL HIP W/  RESURFACING IMPLANTS    . REPLACEMENT TOTAL KNEE BILATERAL  1997  . ROTATOR CUFF REPAIR  06/2008  . ROTATOR CUFF REPAIR Left 2010  . SHOULDER ARTHROSCOPY Left 06/19/2008   WITH SUBACROMIAL DECOMPRESSION. DISTAL CLAVICLE EXCISION. ARTHROSCOPIC ROTATOR CUFF REPAIR.  Marland Kitchen. TOTAL HIP ARTHROPLASTY  2009   right  . UMBILICAL HERNIA REPAIR  2001  . VASECTOMY      Social History   Socioeconomic History  . Marital status: Married    Spouse name: Glena Norfolkolly  . Number of children: 3  . Years of education: College  . Highest education level: Not on file  Occupational History  .  Occupation: Retired  Engineer, productionocial Needs  . Financial resource strain: Not on file  . Food insecurity:    Worry: Not on file    Inability: Not on file  . Transportation needs:    Medical: Not on file    Non-medical: Not on file  Tobacco Use  . Smoking status: Former Smoker    Packs/day: 1.00    Years: 25.00    Pack years: 25.00    Types: Cigarettes    Last attempt to quit: 06/24/1973    Years since quitting: 45.5  . Smokeless tobacco: Never Used  Substance and Sexual Activity  . Alcohol use: Not Currently    Comment: occasional  . Drug use: No  . Sexual activity: Not Currently  Lifestyle  . Physical activity:    Days per week: Not on file    Minutes per session: Not on file  . Stress: Not on file  Relationships  . Social connections:    Talks on phone: Not on  file    Gets together: Not on file    Attends religious service: Not on file    Active member of club or organization: Not on file    Attends meetings of clubs or organizations: Not on file    Relationship status: Not on file  . Intimate partner violence:    Fear of current or ex partner: Not on file    Emotionally abused: Not on file    Physically abused: Not on file    Forced sexual activity: Not on file  Other Topics Concern  . Not on file  Social History Narrative   Patient lives at home with spouse.   Caffeine Use: 2 cups of coffee daily      [facility]   Married    3 children   Former smoker   Occasional drinker   DNR   Family History  Problem Relation Age of Onset  . Hypertension Mother   . Heart disease Mother   . Hypertension Father   . Seizures Father        epilepsy  . Heart disease Father   . Prostate cancer Other   . Cancer Other   . Hypertension Other   . Asthma Other   . Hypertension Brother   . Breast cancer Daughter       VITAL SIGNS BP (!) 132/93   Pulse 83   Temp 98.3 F (36.8 C)   Resp 15   Ht 5\' 10"  (1.778 m)   Wt 185 lb 4.8 oz (84.1 kg)   SpO2 93%   BMI 26.59 kg/m   Outpatient Encounter Medications as of 01/08/2019  Medication Sig  . acetaminophen (TYLENOL) 325 MG tablet Take 650 mg by mouth 4 (four) times daily as needed. Max of 3000 mg of Apap in 24 hrs  . alum & mag hydroxide-simeth (MAALOX PLUS) 400-400-40 MG/5ML suspension Take 30 mLs by mouth every 4 (four) hours as needed for indigestion.  . bisacodyl (BISCOLAX) 10 MG suppository Place 10 mg rectally 2 (two) times daily as needed for moderate constipation. Give until BM.  . carbidopa-levodopa (SINEMET IR) 25-250 MG tablet Take 1 tablet by mouth 2 (two) times daily.   . cetirizine (ZYRTEC) 10 MG tablet Take 10 mg by mouth at bedtime.  Marland Kitchen. desvenlafaxine (PRISTIQ) 50 MG 24 hr tablet Take 50 mg by mouth daily.   . fluticasone (FLONASE) 50 MCG/ACT  nasal spray Place 1 spray into both nostrils daily.   .Marland Kitchen  guaiFENesin (ROBITUSSIN) 100 MG/5ML liquid Take 200 mg by mouth every 4 (four) hours as needed for cough. Notify provider if cough worsens/ continues longer than 3 days.  Marland Kitchen HYDROcodone-acetaminophen (NORCO) 5-325 MG tablet Take 1 tab every 8 hours routinely and every 6 hours as needed for pain.  Marland Kitchen ipratropium (ATROVENT) 0.03 % nasal spray Place 2 sprays into both nostrils every 12 (twelve) hours.  Marland Kitchen ipratropium-albuterol (DUONEB) 0.5-2.5 (3) MG/3ML SOLN Take 3 mLs by nebulization every 6 (six) hours as needed (wheezing and shortness of breath with cough).  Marland Kitchen lisinopril (PRINIVIL,ZESTRIL) 40 MG tablet TAKE ONE TABLET BY MOUTH EVERY DAY  . LORazepam (ATIVAN) 0.5 MG tablet Take 0.5 mg by mouth every 12 (twelve) hours as needed for anxiety.  . magnesium hydroxide (MILK OF MAGNESIA) 400 MG/5ML suspension Take 30 mLs by mouth every 4 (four) hours as needed. If constipation/ no BM for 2 days  . Melatonin 3 MG TABS Take 1 tablet by mouth at bedtime. Should take 1 - 2 hours before sleep  . NON FORMULARY Diet Type: Regular diet  . ondansetron (ZOFRAN) 4 MG tablet Take 4 mg by mouth every 6 (six) hours as needed for nausea or vomiting.  . OXYGEN Inhale 2 L into the lungs as needed. Check O2 sat prior to placing on patient and at least every 4 hours after applying. Notify MD if O2 sat drop below baseline while receiving oxygen or no improvement in dyspnea  . OXYGEN Inhale 2 L into the lungs at bedtime. For sleep apnea like behavior  . pantoprazole (PROTONIX) 40 MG tablet Take 40 mg by mouth daily.  . Rivaroxaban (XARELTO) 15 MG TABS tablet Take 15 mg by mouth daily with supper. for anticoagulation. Monitor for bleeding/bruising.  . sennosides-docusate sodium (SENOKOT-S) 8.6-50 MG tablet Take 2 tablets by mouth 2 (two) times daily.   . sodium phosphate (FLEET) enema Place 1 enema rectally every three (3) days as needed. If constipation not relieved by milk  of magnesia or bisacodyl suppository. Follow package directions  . sorbitol 70 % solution Take 30 mLs by mouth every 2 (two) hours as needed. Constipation. Give PO Q 2 hours until pt has large BM. Give first dose now along with Bisacodyl suppository  . tamsulosin (FLOMAX) 0.4 MG CAPS capsule Take 0.4 mg by mouth daily after supper. 30 mins after supper  . Zinc Oxide 10 % OINT Apply liberal amount topically to areas of skin irritation as needed. Ok to leave at bedside.   No facility-administered encounter medications on file as of 01/08/2019.      SIGNIFICANT DIAGNOSTIC EXAMS  LABS REVIEWED PREVIOUS:   03-12-18: wbc 5.5; hgb 13.1; hct 38.6; mcv 99.4; plt 137; glucose 115; bun 18; creat 0.92; k+ 3.9; na++ 138; ca 9.1; liver normal albumin 4.2; vit B 12: 615;  05-03-18: urine culture: no growth  08-24-18: urine culture: none 09-13-18: wbc  5.4; hgb 12.6; hc6 37.9; mcv 103.3; plt 147; glucose 116; bun 20; creat 1.12; k+ 4.4; na++ 41; ca 9.0; liver normal albumin 4.2 tsh 1.231 10-08-18:  Urine culture: multiple species 10-13-18: urine culture: <10,000 colonies.  11-03-18: urine culture: no growth 11-07-18: urine culture: <10,000 12-20-18: urine culture: no growth  TODAY:  12-27-18: wbc  5.9; hgb 12.8; hct 40.4; mcv 107.4; plt 137; glucose 101; bun 20; creat 0.91; k+ 4.8; na++ 139; ca 8.8; liver normal albumin 3.9  Review of Systems  Constitutional: Negative for malaise/fatigue.  Respiratory: Negative for cough and shortness of breath.  Cardiovascular: Negative for chest pain, palpitations and leg swelling.  Gastrointestinal: Negative for abdominal pain, constipation and heartburn.  Musculoskeletal: Negative for back pain, joint pain and myalgias.  Skin: Negative.   Neurological: Negative for dizziness.  Psychiatric/Behavioral: Positive for depression. The patient is nervous/anxious.      Physical Exam Constitutional:      General: He is not in acute distress.    Appearance: He is  well-developed. He is not diaphoretic.  Neck:     Thyroid: No thyromegaly.  Cardiovascular:     Rate and Rhythm: Normal rate and regular rhythm.     Pulses: Normal pulses.     Heart sounds: Normal heart sounds.     Comments: Pace maker  Pulmonary:     Effort: Pulmonary effort is normal. No respiratory distress.     Breath sounds: Normal breath sounds.     Comments: 02 at HS  Abdominal:     General: Bowel sounds are normal. There is no distension.     Palpations: Abdomen is soft.     Tenderness: There is no abdominal tenderness.  Musculoskeletal:     Right lower leg: No edema.     Left lower leg: No edema.     Comments: Is able to move all extremities  Kyphosis  Has history of knee and hip replacements       Lymphadenopathy:     Cervical: No cervical adenopathy.  Skin:    General: Skin is warm and dry.  Neurological:     Mental Status: He is alert and oriented to person, place, and time.      ASSESSMENT/ PLAN:  TODAY:   1. Major depression, recurrent, chronic is worse; will continue pristiq 50 mg daily will change ativan to 0.5 mg nightly routinely and will have 1 dose available during the day as needed for 2 weeks.      MD is aware of resident's narcotic use and is in agreement with current plan of care. We will attempt to wean resident as apropriate   Synthia Innocent NP [facility] Adult Medicine  Contact [phone] Monday through Friday 8am- 5pm  After hours call [phone]

## 2019-01-14 ENCOUNTER — Encounter: Payer: Self-pay | Admitting: Adult Health

## 2019-01-14 ENCOUNTER — Encounter
Admission: RE | Admit: 2019-01-14 | Discharge: 2019-01-14 | Disposition: A | Discharge status: Home / Self Care | Payer: Medicare Other | Source: Ambulatory Visit | Attending: Internal Medicine | Admitting: Internal Medicine

## 2019-01-14 ENCOUNTER — Non-Acute Institutional Stay (SKILLED_NURSING_FACILITY): Payer: Medicare Other | Admitting: Adult Health

## 2019-01-14 ENCOUNTER — Other Ambulatory Visit: Payer: Self-pay | Admitting: Adult Health

## 2019-01-14 DIAGNOSIS — M159 Polyosteoarthritis, unspecified: Secondary | ICD-10-CM | POA: Diagnosis not present

## 2019-01-14 DIAGNOSIS — F339 Major depressive disorder, recurrent, unspecified: Secondary | ICD-10-CM

## 2019-01-14 MED ORDER — LORAZEPAM 0.5 MG PO TABS: 0.5000 mg | ORAL_TABLET | Freq: Two times a day (BID) | ORAL | 0 refills | Status: AC | PRN

## 2019-01-14 NOTE — Progress Notes (Addendum)
Location:   [facility] Room Number: 336 P Place of Service:  SNF (31)   CODE STATUS: DNR  Allergies  Allergen Reactions  . Citalopram     Other reaction(s): Unknown  . Codeine Nausea And Vomiting  . Sertraline     Other reaction(s): Unknown Other reaction(s): Unknown  . Lodine [Etodolac] Rash  . Percocet [Oxycodone-Acetaminophen] Rash    Tolerates tylenol Patient is able to take this recently    Chief Complaint  Patient presents with  . Acute Visit    Anxiety    HPI:  He has been been on as needed ativan for anxiety. He has not needed this medication on a routine bases. He tells me that he does worry at times about family and being in SNF. He has not used his vicodin since 12-31-18. He denies any uncontrolled pain; no changes in appetite.   Past Medical History:  Diagnosis Date  . Anxiety   . Arthritis   . Blood transfusion    hx of autologus transfusion  . Cataract cortical, senile   . Chicken pox   . Clotting disorder (HCC)   . Complete heart block (HCC)   . Depression    unspecified  . DVT (deep venous thrombosis) (HCC) 09/2014   left leg  . GERD (gastroesophageal reflux disease)   . Hereditary and idiopathic neuropathy   . History of depression   . Hypertension   . MCI (mild cognitive impairment) 10/22/2013  . Memory deficit 03/24/2011  . Memory loss   . Movement disorder   . Neuromuscular disorder (HCC)    peripheral  neuropathy  . Neuropathy   . Osteoarthritis   . Other hammer toe(s) (acquired), unspecified foot   . Pacemaker   . Parkinson's disease (HCC)   . Parkinsonism (HCC) 10/22/2013  . Shortness of breath   . Spinal stenosis   . Synovitis and tenosynovitis, unspecified   . Third degree heart block (HCC)   . Tremor     Past Surgical History:  Procedure Laterality Date  . CATARACT EXTRACTION  06/2005  . COLONOSCOPY  06/13/2006   PH colon polyps (MUS) repeat 7/20  . CORRECTION HAMMER TOE    . defibrilator   04/04/2012   Dual Chamber  . INSERT / REPLACE / REMOVE PACEMAKER  04/04/2012  . JOINT REPLACEMENT Right    THA  . JOINT REPLACEMENT Bilateral    TKA  . KNEE ARTHROSCOPY Bilateral    Right x 2 and Left  . KNEE SURGERY  [id number]  . LEAD REVISION N/A 04/05/2012   Procedure: LEAD REVISION;  Surgeon: Duke Salvia, MD;  Location: [facility] CATH LAB;  Service: Cardiovascular;  Laterality: N/A;  . PERMANENT PACEMAKER INSERTION N/A 04/04/2012   Procedure: PERMANENT PACEMAKER INSERTION;  Surgeon: Marinus Maw, MD;  Location: [facility] CATH LAB;  Service: Cardiovascular;  Laterality: N/A;  . REPLACEMENT TOTAL HIP W/  RESURFACING IMPLANTS    . REPLACEMENT TOTAL KNEE BILATERAL  1997  . ROTATOR CUFF REPAIR  06/2008  . ROTATOR CUFF REPAIR Left 2010  . SHOULDER ARTHROSCOPY Left 06/19/2008   WITH SUBACROMIAL DECOMPRESSION. DISTAL CLAVICLE EXCISION. ARTHROSCOPIC ROTATOR CUFF REPAIR.  Marland Kitchen TOTAL HIP ARTHROPLASTY  2009   right  . UMBILICAL HERNIA REPAIR  2001  . VASECTOMY      Social History   Socioeconomic History  . Marital status: Married    Spouse name: Glena Norfolk  . Number of children: 3  . Years of education: College  .  Highest education level: Not on file  Occupational History  . Occupation: Retired  Engineer, production  . Financial resource strain: Not on file  . Food insecurity:    Worry: Not on file    Inability: Not on file  . Transportation needs:    Medical: Not on file    Non-medical: Not on file  Tobacco Use  . Smoking status: Former Smoker    Packs/day: 1.00    Years: 25.00    Pack years: 25.00    Types: Cigarettes    Last attempt to quit: 06/24/1973    Years since quitting: 45.5  . Smokeless tobacco: Never Used  Substance and Sexual Activity  . Alcohol use: Not Currently    Comment: occasional  . Drug use: No  . Sexual activity: Not Currently  Lifestyle  . Physical activity:    Days per week: Not on file    Minutes per session: Not on file  . Stress: Not on file  Relationships  .  Social connections:    Talks on phone: Not on file    Gets together: Not on file    Attends religious service: Not on file    Active member of club or organization: Not on file    Attends meetings of clubs or organizations: Not on file    Relationship status: Not on file  . Intimate partner violence:    Fear of current or ex partner: Not on file    Emotionally abused: Not on file    Physically abused: Not on file    Forced sexual activity: Not on file  Other Topics Concern  . Not on file  Social History Narrative   Patient lives at home with spouse.   Caffeine Use: 2 cups of coffee daily      Admitted to [facility] 11/18/2015   Married    3 children   Former smoker   Occasional drinker   DNR   Family History  Problem Relation Age of Onset  . Hypertension Mother   . Heart disease Mother   . Hypertension Father   . Seizures Father        epilepsy  . Heart disease Father   . Prostate cancer Other   . Cancer Other   . Hypertension Other   . Asthma Other   . Hypertension Brother   . Breast cancer Daughter       VITAL SIGNS BP (!) 132/93   Pulse 83   Temp 98.3 F (36.8 C)   Resp 15   Ht 5\' 10"  (1.778 m)   Wt 187 lb 11.2 oz (85.1 kg)   SpO2 100%   BMI 26.93 kg/m   Outpatient Encounter Medications as of 01/14/2019  Medication Sig  . acetaminophen (TYLENOL) 325 MG tablet Take 650 mg by mouth 4 (four) times daily as needed. Max of 3000 mg of Apap in 24 hrs  . alum & mag hydroxide-simeth (MAALOX PLUS) 400-400-40 MG/5ML suspension Take 30 mLs by mouth every 4 (four) hours as needed for indigestion.  . bisacodyl (BISCOLAX) 10 MG suppository Place 10 mg rectally 2 (two) times daily as needed for moderate constipation. Give until BM.  . carbidopa-levodopa (SINEMET IR) 25-250 MG tablet Take 1 tablet by mouth 2 (two) times daily.   . cetirizine (ZYRTEC) 10 MG tablet Take 10 mg by mouth at bedtime.  Marland Kitchen desvenlafaxine (PRISTIQ) 50 MG 24 hr tablet Take 50 mg by mouth  daily.   . fluticasone (FLONASE) 50  MCG/ACT nasal spray Place 1 spray into both nostrils daily.   Marland Kitchen. guaiFENesin (ROBITUSSIN) 100 MG/5ML liquid Take 200 mg by mouth every 4 (four) hours as needed for cough. Notify provider if cough worsens/ continues longer than 3 days.  Marland Kitchen. HYDROcodone-acetaminophen (NORCO/VICODIN) 5-325 MG tablet Take 1 tablet by mouth every 8 (eight) hours.  Marland Kitchen. ipratropium (ATROVENT) 0.03 % nasal spray Place 2 sprays into both nostrils every 12 (twelve) hours.  Marland Kitchen. ipratropium-albuterol (DUONEB) 0.5-2.5 (3) MG/3ML SOLN Take 3 mLs by nebulization every 6 (six) hours as needed (wheezing and shortness of breath with cough).  Marland Kitchen. lisinopril (PRINIVIL,ZESTRIL) 40 MG tablet TAKE ONE TABLET BY MOUTH EVERY DAY  . LORazepam (ATIVAN) 0.5 MG tablet Take 1 tablet (0.5 mg total) by mouth every 12 (twelve) hours as needed for up to 14 days for anxiety.  . magnesium hydroxide (MILK OF MAGNESIA) 400 MG/5ML suspension Take 30 mLs by mouth every 4 (four) hours as needed. If constipation/ no BM for 2 days  . Melatonin 3 MG TABS Take 1 tablet by mouth at bedtime. Should take 1 - 2 hours before sleep  . NON FORMULARY Diet Type: Regular diet  . ondansetron (ZOFRAN) 4 MG tablet Take 4 mg by mouth every 6 (six) hours as needed for nausea or vomiting.  . OXYGEN Inhale 2 L into the lungs as needed. Check O2 sat prior to placing on patient and at least every 4 hours after applying. Notify MD if O2 sat drop below baseline while receiving oxygen or no improvement in dyspnea  . OXYGEN Inhale 2 L into the lungs at bedtime. For sleep apnea like behavior  . pantoprazole (PROTONIX) 40 MG tablet Take 40 mg by mouth daily.  . Rivaroxaban (XARELTO) 15 MG TABS tablet Take 15 mg by mouth daily with supper. for anticoagulation. Monitor for bleeding/bruising.  . sennosides-docusate sodium (SENOKOT-S) 8.6-50 MG tablet Take 2 tablets by mouth 2 (two) times daily.   . sodium phosphate (FLEET) enema Place 1 enema rectally every  three (3) days as needed. If constipation not relieved by milk of magnesia or bisacodyl suppository. Follow package directions  . sorbitol 70 % solution Take 30 mLs by mouth every 2 (two) hours as needed. Constipation. Give PO Q 2 hours until pt has large BM. Give first dose now along with Bisacodyl suppository  . tamsulosin (FLOMAX) 0.4 MG CAPS capsule Take 0.4 mg by mouth daily after supper. 30 mins after supper  . Zinc Oxide 10 % OINT Apply liberal amount topically to areas of skin irritation as needed. Ok to leave at bedside.  . [DISCONTINUED] HYDROcodone-acetaminophen (NORCO) 5-325 MG tablet Take 1 tab every 8 hours routinely and every 6 hours as needed for pain. (Patient not taking: Reported on 01/14/2019)   No facility-administered encounter medications on file as of 01/14/2019.      SIGNIFICANT DIAGNOSTIC EXAMS  LABS REVIEWED PREVIOUS:   03-12-18: wbc 5.5; hgb 13.1; hct 38.6; mcv 99.4; plt 137; glucose 115; bun 18; creat 0.92; k+ 3.9; na++ 138; ca 9.1; liver normal albumin 4.2; vit B 12: 615;  05-03-18: urine culture: no growth  08-24-18: urine culture: none 09-13-18: wbc  5.4; hgb 12.6; hc6 37.9; mcv 103.3; plt 147; glucose 116; bun 20; creat 1.12; k+ 4.4; na++ 41; ca 9.0; liver normal albumin 4.2 tsh 1.231 10-08-18:  Urine culture: multiple species 10-13-18: urine culture: <10,000 colonies.  11-03-18: urine culture: no growth 11-07-18: urine culture: <10,000 12-20-18: urine culture: no growth 12-27-18: wbc  5.9; hgb  12.8; hct 40.4; mcv 107.4; plt 137; glucose 101; bun 20; creat 0.91; k+ 4.8; na++ 139; ca 8.8; liver normal albumin 3.9  NO NEW LABS.    Review of Systems  Constitutional: Negative for malaise/fatigue.  Respiratory: Negative for cough and shortness of breath.   Cardiovascular: Negative for chest pain, palpitations and leg swelling.  Gastrointestinal: Negative for abdominal pain, constipation and heartburn.  Musculoskeletal: Negative for back pain, joint pain and myalgias.    Skin: Negative.   Neurological: Negative for dizziness.  Psychiatric/Behavioral: Positive for depression. The patient is nervous/anxious.        Prn ativan does help      Physical Exam Constitutional:      General: He is not in acute distress.    Appearance: He is well-developed. He is not diaphoretic.  Neck:     Musculoskeletal: Neck supple.     Thyroid: No thyromegaly.  Cardiovascular:     Rate and Rhythm: Normal rate and regular rhythm.     Heart sounds: Normal heart sounds.     Comments: Pace maker  Pulmonary:     Effort: Pulmonary effort is normal. No respiratory distress.     Breath sounds: Normal breath sounds.     Comments: 02 at HS Abdominal:     General: Bowel sounds are normal. There is no distension.     Palpations: Abdomen is soft.     Tenderness: There is no abdominal tenderness.  Musculoskeletal:     Right lower leg: No edema.     Left lower leg: No edema.     Comments: Is able to move all extremities  Kyphosis  Has history of knee and hip replacements       Lymphadenopathy:     Cervical: No cervical adenopathy.  Skin:    General: Skin is warm and dry.  Neurological:     Mental Status: He is alert and oriented to person, place, and time.  Psychiatric:     Comments: Is anxious at times.        ASSESSMENT/ PLAN:  TODAY:   1. Major depression, recurrent, chronic is without change ; will continue pristiq 50 mg daily  ativan  0.5 mg nightly routinely will continue his prn ativan 0.5 mg one time daily as needed for 2 weeks.   2. Osteoarthritis involving mulitple joints on both sides of body: stable: will stop vicodin due to non-use.    MD is aware of resident's narcotic use and is in agreement with current plan of care. We will attempt to wean resident as apropriate   Synthia Innocenteborah Geraldyne Barraclough NP [facility]  Contact [phone] Monday through Friday 8am- 5pm  After hours call [phone]

## 2019-01-24 ENCOUNTER — Ambulatory Visit (INDEPENDENT_AMBULATORY_CARE_PROVIDER_SITE_OTHER): Payer: Medicare Other

## 2019-01-24 DIAGNOSIS — I442 Atrioventricular block, complete: Secondary | ICD-10-CM

## 2019-01-25 ENCOUNTER — Telehealth: Payer: Self-pay

## 2019-01-25 NOTE — Telephone Encounter (Signed)
Left message for patient to remind of missed remote transmission.  

## 2019-01-26 LAB — CUP PACEART REMOTE DEVICE CHECK
Battery Impedance: 1569 Ohm
Battery Remaining Longevity: 38 mo
Battery Voltage: 2.76 V
Brady Statistic AP VP Percent: 32 %
Brady Statistic AP VS Percent: 0 %
Brady Statistic AS VP Percent: 64 %
Brady Statistic AS VS Percent: 4 %
Date Time Interrogation Session: 20200214193354
Implantable Lead Implant Date: 20130424
Implantable Lead Implant Date: 20130424
Implantable Lead Location: 753859
Implantable Lead Location: 753860
Implantable Lead Model: 5076
Implantable Lead Model: 5076
Implantable Pulse Generator Implant Date: 20130424
Lead Channel Impedance Value: 514 Ohm
Lead Channel Impedance Value: 580 Ohm
Lead Channel Pacing Threshold Amplitude: 0.5 V
Lead Channel Pacing Threshold Amplitude: 0.625 V
Lead Channel Pacing Threshold Pulse Width: 0.4 ms
Lead Channel Pacing Threshold Pulse Width: 0.4 ms
Lead Channel Setting Pacing Amplitude: 1.5 V
Lead Channel Setting Pacing Amplitude: 2.5 V
Lead Channel Setting Pacing Pulse Width: 0.4 ms
Lead Channel Setting Sensing Sensitivity: 4 mV

## 2019-01-30 ENCOUNTER — Other Ambulatory Visit: Payer: Self-pay | Admitting: Adult Health

## 2019-01-30 ENCOUNTER — Encounter: Payer: Self-pay | Admitting: Nurse Practitioner

## 2019-01-30 ENCOUNTER — Non-Acute Institutional Stay: Payer: Medicare Other | Admitting: Nurse Practitioner

## 2019-01-30 ENCOUNTER — Encounter: Payer: Self-pay | Admitting: Adult Health

## 2019-01-30 ENCOUNTER — Non-Acute Institutional Stay (SKILLED_NURSING_FACILITY): Payer: Medicare Other | Admitting: Adult Health

## 2019-01-30 VITALS — HR 82 | Temp 98.0°F | Resp 18 | Wt 186.7 lb

## 2019-01-30 DIAGNOSIS — F419 Anxiety disorder, unspecified: Secondary | ICD-10-CM

## 2019-01-30 DIAGNOSIS — F339 Major depressive disorder, recurrent, unspecified: Secondary | ICD-10-CM | POA: Diagnosis not present

## 2019-01-30 DIAGNOSIS — G8929 Other chronic pain: Secondary | ICD-10-CM | POA: Diagnosis not present

## 2019-01-30 DIAGNOSIS — R413 Other amnesia: Secondary | ICD-10-CM | POA: Insufficient documentation

## 2019-01-30 DIAGNOSIS — Z515 Encounter for palliative care: Secondary | ICD-10-CM

## 2019-01-30 DIAGNOSIS — R5381 Other malaise: Secondary | ICD-10-CM | POA: Insufficient documentation

## 2019-01-30 MED ORDER — LORAZEPAM 0.5 MG PO TABS: 0.5000 mg | ORAL_TABLET | Freq: Two times a day (BID) | ORAL | 0 refills | Status: DC | PRN

## 2019-01-30 MED ORDER — DESVENLAFAXINE SUCCINATE ER 50 MG PO TB24: 50.0000 mg | ORAL_TABLET | Freq: Every day | ORAL | 0 refills | Status: DC

## 2019-01-30 MED ORDER — HYDROCODONE-ACETAMINOPHEN 5-325 MG PO TABS: 1.0000 | ORAL_TABLET | Freq: Three times a day (TID) | ORAL | 0 refills | Status: DC

## 2019-01-30 NOTE — Telephone Encounter (Signed)
Refill request completed and sent to Kenard Gower, NP for approval.  He gets his medication from an outside pharmacy, [facility].

## 2019-01-30 NOTE — Progress Notes (Signed)
Therapist, nutritional Palliative Care Consult Note Telephone: [phone]  Fax: [phone]  PATIENT NAME: Randy [name] DOB: 1934/11/04 MRN: [id number]  PRIMARY CARE PROVIDER:   Lauro Regulus, MD  REFERRING PROVIDER:  Dr Anderson/[facility] RESPONSIBLE PARTY:   Bartholomew Liljedahl [phone] wife  RECOMMENDATIONS and PLAN:   1. Palliative care encounter Z51.5; Palliative medicine team will continue to support patient, patient's family, and medical team. Visit consisted of counseling and education dealing with the complex and emotionally intense issues of symptom management and palliative care in the setting of serious and potentially life-threatening illness  2. Debility R53.81 secondary to parkinson vs parkinsonism symptoms, encourage passive rom; encourage to transfer to geri-chair.   3. Memory loss R41.3 appears progressive. Medical goals to continue to focus on Comfort, redirecting with supportive measures.  ASSESSMENT:     I visited and observed Randy [name]. We talked about purpose for palliative care visit. We talked about symptoms of pain and shortness of breath which he denies. We talked about weakness and fatigue. We talked about energy conservation. We talked about his mobility. Praise Randy [name] for walking with his walker. Continue to encourage him to ask staff for assistance. He was very proud of himself and talked at length about his walking. We talked about his appetite. We talked about foods that he likes. We talked about activities that he likes to go to including movies that the facility puts on. He talked about other social gatherings that he likes to do including meaning his wife for dinner. He talked about other residents he interacts with. He talked about family dynamics. Therapeutic listening and emotional support provided. Discussed will contact his wife for update on palliative care visit. DNR does remain in place. Medical goals  to continue to treat what is treatable. I updated nursing staff in the new changes to current goals or plan of care.  I called Randy [name], Randy [name] wife. Very lengthy discussion with Randy [name]. We talked about purpose for palliative care visit. Update given on palliative care visit with Randy [name]. We talked about symptoms including her concern for increasing hallucinations. We talked about his visual hallucinations are not scary to him as  he watches them like a TV show. We talked about chronic disease progression of Parkinson's versus parkinsonian symptoms. We talked about medications including Ativan and Pristiq which he currently is on. We talked about his past neurology appointments with Dr. Sandria Manly, retired neurologist and now his current neurologist Dr. Sherryll Burger, neurologist. We talked about recommendation for psychiatry with psychotherapy to follow at [facility]illed [facility] Facility. Randy [name] endorses that she will request that of as she will have to sign consents. We talked about challenges residing in Skilled Nursing Facility. We talked about what frustrates Randy [name]. We talked about caregiver fatigue and burn out. We talked about self care for [facility]. We talked about his social request about going to dinner and interacting with other residents. We talked about medical goals of care. DNR does remain in place. We talked about role of palliative care and plan of care. Therapeutic listening and emotional support provided. Questions answered to satisfaction. Discuss will follow up in 3 months if needed or sooner should he declined. Aside from hallucinations and slow cognitive functional changes he does continue to appear stable.  7 / 12 / 2018 weight 196.4 lbs 2 / 19 / 2020 weight 186.7 lb  1 / 16 / 2,020 sodium 139, potassium 4.8, chloride 107, Co2 27, calcium 8.8, bun  20, creatinine 0.91, glucose 101, albumin 3.9, total protein 6.2, WBC 5.9, hemoglobin 12.8, hematocrit 40.4, platelets 137  I spent 90 minutes  providing this consultation,  from 2:00pm to 3:30pm. More than 50% of the time in this consultation was spent coordinating communication.   HISTORY OF PRESENT ILLNESS:  Randy [name] is a 85 y.o. year old male with multiple medical problems including Pacemaker secondary to third degree heart block, Parkinson's disease versus parkinsonian symptoms, spinal stenosis, Tremor, neuromuscular disorder, peripheral neuropathy, osteoporosis, hypertension, gerd, dvt left leg 2015, clotting disorder, anxiety, depression, history of umbilical hernia repair, right total hip arthroplasty, left shoulder arthroplasty, left rotator cuff repair, total knee replacement, hammertoes correction, cataract extraction, vasectomy.  Randy [name] continues to reside at [facility] at [facility] [place]. He has progressed to being able to stand in ambulate with a walker and assistance. He is total ADL care. He does require assistance with toileting. He goes to the dining area to eat his meals. He meets his wife for dinner who also resides in the independent apartments at [facility] [place]. He is able to feed himself. Appetite has been good per staff. He is able to verbalize his needs. No recent infections, wounds, falls, hospitalizations. DNR does remain in place. At present Randy [name] is sitting in the recliner in his room. He appears comfortable. Palliative Care was asked to help address goals of care.   CODE STATUS: DNR  PPS: 40% HOSPICE ELIGIBILITY/DIAGNOSIS: TBD  PAST MEDICAL HISTORY:  Past Medical History:  Diagnosis Date  . Anxiety   . Arthritis   . Blood transfusion    hx of autologus transfusion  . Cataract cortical, senile   . Chicken pox   . Clotting disorder (HCC)   . Complete heart block (HCC)   . Depression    unspecified  . DVT (deep venous thrombosis) (HCC) 09/2014   left leg  . GERD (gastroesophageal reflux disease)   . Hereditary and idiopathic neuropathy     . History of depression   . Hypertension   . MCI (mild cognitive impairment) 10/22/2013  . Memory deficit 03/24/2011  . Memory loss   . Movement disorder   . Neuromuscular disorder (HCC)    peripheral  neuropathy  . Neuropathy   . Osteoarthritis   . Other hammer toe(s) (acquired), unspecified foot   . Pacemaker   . Parkinson's disease (HCC)   . Parkinsonism (HCC) 10/22/2013  . Shortness of breath   . Spinal stenosis   . Synovitis and tenosynovitis, unspecified   . Third degree heart block (HCC)   . Tremor     SOCIAL HX:  Social History   Tobacco Use  . Smoking status: Former Smoker    Packs/day: 1.00    Years: 25.00    Pack years: 25.00    Types: Cigarettes    Last attempt to quit: 06/24/1973    Years since quitting: 45.6  . Smokeless tobacco: Never Used  Substance Use Topics  . Alcohol use: Not Currently    Comment: occasional    ALLERGIES:  Allergies  Allergen Reactions  . Citalopram     Other reaction(s): Unknown  . Codeine Nausea And Vomiting  . Sertraline     Other reaction(s): Unknown Other reaction(s): Unknown  . Lodine [Etodolac] Rash  . Percocet [Oxycodone-Acetaminophen] Rash    Tolerates tylenol Patient is able to take this recently     PERTINENT MEDICATIONS:  Outpatient Encounter Medications as of 01/30/2019  Medication  Sig  . acetaminophen (TYLENOL) 325 MG tablet Take 650 mg by mouth 4 (four) times daily as needed. Max of 3000 mg of Apap in 24 hrs  . alum & mag hydroxide-simeth (MAALOX PLUS) 400-400-40 MG/5ML suspension Take 30 mLs by mouth every 4 (four) hours as needed for indigestion.  . bisacodyl (BISCOLAX) 10 MG suppository Place 10 mg rectally 2 (two) times daily as needed for moderate constipation. Give until BM.  . carbidopa-levodopa (SINEMET IR) 25-250 MG tablet Take 1 tablet by mouth 2 (two) times daily.   . cetirizine (ZYRTEC) 10 MG tablet Take 10 mg by mouth at bedtime.  Marland Kitchen. desvenlafaxine (PRISTIQ) 50 MG 24 hr tablet Take 1 tablet (50  mg total) by mouth daily.  . fluticasone (FLONASE) 50 MCG/ACT nasal spray Place 1 spray into both nostrils daily.   Marland Kitchen. guaiFENesin (ROBITUSSIN) 100 MG/5ML liquid Take 200 mg by mouth every 4 (four) hours as needed for cough. Notify provider if cough worsens/ continues longer than 3 days.  Marland Kitchen. HYDROcodone-acetaminophen (NORCO/VICODIN) 5-325 MG tablet Take 1 tablet by mouth every 8 (eight) hours.  Marland Kitchen. ipratropium (ATROVENT) 0.03 % nasal spray Place 2 sprays into both nostrils every 12 (twelve) hours.  Marland Kitchen. ipratropium-albuterol (DUONEB) 0.5-2.5 (3) MG/3ML SOLN Take 3 mLs by nebulization every 6 (six) hours as needed (wheezing and shortness of breath with cough).  Marland Kitchen. lisinopril (PRINIVIL,ZESTRIL) 40 MG tablet TAKE ONE TABLET BY MOUTH EVERY DAY  . LORazepam (ATIVAN) 0.5 MG tablet Take 1 tablet (0.5 mg total) by mouth every 12 (twelve) hours as needed for anxiety. 0.5 mg qhs scheduled and 0.5 mg once daily prn  . magnesium hydroxide (MILK OF MAGNESIA) 400 MG/5ML suspension Take 30 mLs by mouth every 4 (four) hours as needed. If constipation/ no BM for 2 days  . Melatonin 3 MG TABS Take 1 tablet by mouth at bedtime. Should take 1 - 2 hours before sleep  . NON FORMULARY Diet Type: Regular diet  . ondansetron (ZOFRAN) 4 MG tablet Take 4 mg by mouth every 6 (six) hours as needed for nausea or vomiting.  . OXYGEN Inhale 2 L into the lungs as needed. Check O2 sat prior to placing on patient and at least every 4 hours after applying. Notify MD if O2 sat drop below baseline while receiving oxygen or no improvement in dyspnea  . pantoprazole (PROTONIX) 40 MG tablet Take 40 mg by mouth daily.  . Rivaroxaban (XARELTO) 15 MG TABS tablet Take 15 mg by mouth daily with supper. for anticoagulation. Monitor for bleeding/bruising.  . sennosides-docusate sodium (SENOKOT-S) 8.6-50 MG tablet Take 2 tablets by mouth 2 (two) times daily.   . sodium phosphate (FLEET) enema Place 1 enema rectally every three (3) days as needed. If  constipation not relieved by milk of magnesia or bisacodyl suppository. Follow package directions  . sorbitol 70 % solution Take 30 mLs by mouth every 2 (two) hours as needed. Constipation. Give PO Q 2 hours until pt has large BM. Give first dose now along with Bisacodyl suppository  . tamsulosin (FLOMAX) 0.4 MG CAPS capsule Take 0.4 mg by mouth daily after supper. 30 mins after supper  . Zinc Oxide 10 % OINT Apply liberal amount topically to areas of skin irritation as needed. Ok to leave at bedside.   No facility-administered encounter medications on file as of 01/30/2019.     PHYSICAL EXAM:   General: NAD, obese, debilitated male Cardiovascular: regular rate and rhythm Pulmonary: clear ant fields Abdomen: soft, nontender, +  bowel sounds GU: no suprapubic tenderness Extremities: mild BLE edema, no joint deformities Skin: no rashes Neurological: Weakness but otherwise nonfocal/unsteady gait  Caramia Boutin Prince RomeZ Murdis Flitton, NP

## 2019-01-30 NOTE — Progress Notes (Signed)
[facility] Room Number: 336-P Place of Service:  SNF ([phone]) Provider:  Kenard Gower, NP  Patient Care Team: Lauro Regulus, MD as PCP - General (Internal Medicine) Sharee Holster, NP as Nurse Practitioner (Geriatric Medicine)  Extended Emergency Contact Information Primary Emergency Contact: Marva Panda [place] Home Phone: [phone] Mobile Phone: [phone] Relation: Spouse Secondary Emergency Contact: Gwynneth Munson [place] Mobile Phone: [phone] Relation: Son  Code Status:  DNR  Goals of care: Advanced Directive information Advanced Directives 01/30/2019  Does Patient Have a Medical Advance Directive? Yes  Type of Advance Directive Out of facility DNR (pink MOST or yellow form)  Does patient want to make changes to medical advance directive? No - Patient declined  Copy of Healthcare Power of Attorney in Chart? -  Would patient like information on creating a medical advance directive? -  Pre-existing out of facility DNR order (yellow form or pink MOST form) -     Chief Complaint  Patient presents with  . Acute Visit    Patient is seen for medication management to assess the need for continued use of lorazepam and Norco.    HPI:  Pt is an 85 y.o. male seen today for acute visit for medication management to assess the need for continued use of lorazepam and Norco. He is a long-term care resident of [facility]	[place].  He has a PMH of anxiety, arthritis, DVT, HTN, and depression. He was seen in his room today. He verbalized that his pain is well-controlled. He said that his neck and hip are the usually the parts of his body that hurts. He takes Norco 5-325 mg Q 8 hours.  He said that he doesn't feel anxious but sometimes needing medication to calm down whenever a new situation comes up.    Past Medical History:  Diagnosis Date  . Anxiety   . Arthritis   . Blood  transfusion    hx of autologus transfusion  . Cataract cortical, senile   . Chicken pox   . Clotting disorder (HCC)   . Complete heart block (HCC)   . Depression    unspecified  . DVT (deep venous thrombosis) (HCC) 09/2014   left leg  . GERD (gastroesophageal reflux disease)   . Hereditary and idiopathic neuropathy   . History of depression   . Hypertension   . MCI (mild cognitive impairment) 10/22/2013  . Memory deficit 03/24/2011  . Memory loss   . Movement disorder   . Neuromuscular disorder (HCC)    peripheral  neuropathy  . Neuropathy   . Osteoarthritis   . Other hammer toe(s) (acquired), unspecified foot   . Pacemaker   . Parkinson's disease (HCC)   . Parkinsonism (HCC) 10/22/2013  . Shortness of breath   . Spinal stenosis   . Synovitis and tenosynovitis, unspecified   . Third degree heart block (HCC)   . Tremor    Past Surgical History:  Procedure Laterality Date  . CATARACT EXTRACTION  06/2005  . COLONOSCOPY  06/13/2006   PH colon polyps (MUS) repeat 7/20  . CORRECTION HAMMER TOE    . defibrilator  04/04/2012   Dual Chamber  . INSERT / REPLACE / REMOVE PACEMAKER  04/04/2012  . JOINT REPLACEMENT Right    THA  . JOINT REPLACEMENT Bilateral    TKA  . KNEE ARTHROSCOPY Bilateral    Right x 2 and Left  . KNEE SURGERY  [id number]  .  LEAD REVISION N/A 04/05/2012   Procedure: LEAD REVISION;  Surgeon: Duke Salvia, MD;  Location: [facility] CATH LAB;  Service: Cardiovascular;  Laterality: N/A;  . PERMANENT PACEMAKER INSERTION N/A 04/04/2012   Procedure: PERMANENT PACEMAKER INSERTION;  Surgeon: Marinus Maw, MD;  Location: [facility] CATH LAB;  Service: Cardiovascular;  Laterality: N/A;  . REPLACEMENT TOTAL HIP W/  RESURFACING IMPLANTS    . REPLACEMENT TOTAL KNEE BILATERAL  1997  . ROTATOR CUFF REPAIR  06/2008  . ROTATOR CUFF REPAIR Left 2010  . SHOULDER ARTHROSCOPY Left 06/19/2008   WITH SUBACROMIAL DECOMPRESSION. DISTAL CLAVICLE EXCISION. ARTHROSCOPIC ROTATOR CUFF REPAIR.  Marland Kitchen  TOTAL HIP ARTHROPLASTY  2009   right  . UMBILICAL HERNIA REPAIR  2001  . VASECTOMY      Allergies  Allergen Reactions  . Citalopram     Other reaction(s): Unknown  . Codeine Nausea And Vomiting  . Sertraline     Other reaction(s): Unknown Other reaction(s): Unknown  . Lodine [Etodolac] Rash  . Percocet [Oxycodone-Acetaminophen] Rash    Tolerates tylenol Patient is able to take this recently    Outpatient Encounter Medications as of 01/30/2019  Medication Sig  . acetaminophen (TYLENOL) 325 MG tablet Take 650 mg by mouth 4 (four) times daily as needed. Max of 3000 mg of Apap in 24 hrs  . alum & mag hydroxide-simeth (MAALOX PLUS) 400-400-40 MG/5ML suspension Take 30 mLs by mouth every 4 (four) hours as needed for indigestion.  . bisacodyl (BISCOLAX) 10 MG suppository Place 10 mg rectally 2 (two) times daily as needed for moderate constipation. Give until BM.  . carbidopa-levodopa (SINEMET IR) 25-250 MG tablet Take 1 tablet by mouth 2 (two) times daily.   . cetirizine (ZYRTEC) 10 MG tablet Take 10 mg by mouth at bedtime.  Marland Kitchen desvenlafaxine (PRISTIQ) 50 MG 24 hr tablet Take 50 mg by mouth daily.   . fluticasone (FLONASE) 50 MCG/ACT nasal spray Place 1 spray into both nostrils daily.   Marland Kitchen guaiFENesin (ROBITUSSIN) 100 MG/5ML liquid Take 200 mg by mouth every 4 (four) hours as needed for cough. Notify provider if cough worsens/ continues longer than 3 days.  Marland Kitchen HYDROcodone-acetaminophen (NORCO/VICODIN) 5-325 MG tablet Take 1 tablet by mouth every 8 (eight) hours.   Marland Kitchen ipratropium (ATROVENT) 0.03 % nasal spray Place 2 sprays into both nostrils every 12 (twelve) hours.  Marland Kitchen ipratropium-albuterol (DUONEB) 0.5-2.5 (3) MG/3ML SOLN Take 3 mLs by nebulization every 6 (six) hours as needed (wheezing and shortness of breath with cough).  Marland Kitchen lisinopril (PRINIVIL,ZESTRIL) 40 MG tablet TAKE ONE TABLET BY MOUTH EVERY DAY  . magnesium hydroxide (MILK OF MAGNESIA) 400 MG/5ML suspension Take 30 mLs by mouth every  4 (four) hours as needed. If constipation/ no BM for 2 days  . Melatonin 3 MG TABS Take 1 tablet by mouth at bedtime. Should take 1 - 2 hours before sleep  . NON FORMULARY Diet Type: Regular diet  . ondansetron (ZOFRAN) 4 MG tablet Take 4 mg by mouth every 6 (six) hours as needed for nausea or vomiting.  . OXYGEN Inhale 2 L into the lungs as needed. Check O2 sat prior to placing on patient and at least every 4 hours after applying. Notify MD if O2 sat drop below baseline while receiving oxygen or no improvement in dyspnea  . pantoprazole (PROTONIX) 40 MG tablet Take 40 mg by mouth daily.  . Rivaroxaban (XARELTO) 15 MG TABS tablet Take 15 mg by mouth daily with supper. for anticoagulation. Monitor for bleeding/bruising.  Marland Kitchen  sennosides-docusate sodium (SENOKOT-S) 8.6-50 MG tablet Take 2 tablets by mouth 2 (two) times daily.   . sodium phosphate (FLEET) enema Place 1 enema rectally every three (3) days as needed. If constipation not relieved by milk of magnesia or bisacodyl suppository. Follow package directions  . sorbitol 70 % solution Take 30 mLs by mouth every 2 (two) hours as needed. Constipation. Give PO Q 2 hours until pt has large BM. Give first dose now along with Bisacodyl suppository  . tamsulosin (FLOMAX) 0.4 MG CAPS capsule Take 0.4 mg by mouth daily after supper. 30 mins after supper  . Zinc Oxide 10 % OINT Apply liberal amount topically to areas of skin irritation as needed. Ok to leave at bedside.  . [DISCONTINUED] OXYGEN Inhale 2 L into the lungs at bedtime. For sleep apnea like behavior   No facility-administered encounter medications on file as of 01/30/2019.     Review of Systems  GENERAL: No change in appetite, no fatigue, no weight changes, no fever, chills or weakness MOUTH and THROAT: Denies oral discomfort, gingival pain or bleeding RESPIRATORY: no cough, SOB, DOE, wheezing, hemoptysis CARDIAC: No chest pain, edema or palpitations GI: No abdominal pain, diarrhea,  constipation, heart burn, nausea or vomiting GU: Denies dysuria, frequency, hematuria, or discharge PSYCHIATRIC: Denies feelings of depression or anxiety. No report of hallucinations, insomnia, paranoia, or agitation    Immunization History  Administered Date(s) Administered  . Influenza-Unspecified 10/20/2014, 09/10/2016, 09/02/2017, 10/12/2018  . PPD Test 12/05/2015, 11/26/2016, 11/11/2018  . Pneumococcal Polysaccharide-23 03/13/2014  . Pneumococcal-Unspecified 10/20/2014   Pertinent  Health Maintenance Due  Topic Date Due  . PNA vac Low Risk Adult (2 of 2 - PCV13) 03/16/2019 (Originally 10/21/2015)  . INFLUENZA VACCINE  Completed   Fall Risk  10/30/2018  Falls in the past year? 0     Vitals:   01/30/19 1230  BP: (!) 132/93  Pulse: 83  Resp: 15  Temp: 98.3 F (36.8 C)  TempSrc: Oral  SpO2: 98%  Weight: 184 lb 4.8 oz (83.6 kg)  Height: 5\' 10"  (1.778 m)   Body mass index is 26.44 kg/m.  Physical Exam  GENERAL APPEARANCE: Well nourished. In no acute distress. Normal body habitus SKIN:  Skin is warm and dry.  MOUTH and THROAT: Lips are without lesions. Oral mucosa is moist and without lesions. Tongue is normal in shape, size, and color and without lesions RESPIRATORY: Breathing is even & unlabored, BS CTAB CARDIAC: RRR, no murmur,no extra heart sounds, no edema GI: Abdomen soft, normal BS, no masses, no tenderness EXTREMITIES:  Able to move X 4 extremities NEUROLOGICAL: There is no tremor. Speech is clear. Alert to self, disoriented to time and place. PSYCHIATRIC:  Affect and behavior are appropriate   Labs reviewed: Recent Labs    03/12/18 1454 09/13/18 1425 12/27/18 0620  NA 138 141 139  K 3.9 4.4 4.8  CL 104 106 107  CO2 26 28 27   GLUCOSE 115* 116* 101*  BUN 18 20 20   CREATININE 0.92 1.12 0.91  CALCIUM 9.1 9.0 8.8*   Recent Labs    03/12/18 1454 09/13/18 1425 12/27/18 0620  AST 15 14* 14*  ALT 7* 8 8  ALKPHOS 63 59 64  BILITOT 0.9 0.9 0.9    PROT 6.6 6.4* 6.2*  ALBUMIN 4.2 4.2 3.9   Recent Labs    08/24/18 1503 09/13/18 1425 12/27/18 0620  WBC 5.7 5.4 5.9  NEUTROABS 2.6 3.1 2.6  HGB 13.4 12.6* 12.8*  HCT 40.0  37.9* 40.4  MCV 103.0* 103.3* 107.4*  PLT 122* 147* 137*   Lab Results  Component Value Date   TSH 1.231 09/13/2018   Assessment/Plan  1. Major depression, recurrent, chronic (HCC) - denies feeling depressed, will continue Pristiq extended release 24-hour 50 mg 1 tab daily  2. Other chronic pain -Well-controlled, continue Norco 5-325 mg 1 tab every 8 hours  3. Anxiety -Mood is a stable, will continue Ativan 0.5 mg 1 tab every 12 hours needed   Family/ staff Communication: Discussed plan of care with resident.  Labs/tests ordered:  None  Goals of care:   Long-term care.   Kenard Gower, NP [facility] and Adult Medicine [phone] (Monday-Friday 8:00 a.m. - 5:00 p.m.) [phone] (after hours)

## 2019-02-04 ENCOUNTER — Encounter: Payer: Self-pay | Admitting: Adult Health

## 2019-02-04 ENCOUNTER — Non-Acute Institutional Stay (SKILLED_NURSING_FACILITY): Payer: Medicare Other | Admitting: Adult Health

## 2019-02-04 DIAGNOSIS — G4709 Other insomnia: Secondary | ICD-10-CM

## 2019-02-04 DIAGNOSIS — I10 Essential (primary) hypertension: Secondary | ICD-10-CM

## 2019-02-04 DIAGNOSIS — M159 Polyosteoarthritis, unspecified: Secondary | ICD-10-CM

## 2019-02-04 DIAGNOSIS — F419 Anxiety disorder, unspecified: Secondary | ICD-10-CM

## 2019-02-04 DIAGNOSIS — I825Y2 Chronic embolism and thrombosis of unspecified deep veins of left proximal lower extremity: Secondary | ICD-10-CM | POA: Diagnosis not present

## 2019-02-04 DIAGNOSIS — F339 Major depressive disorder, recurrent, unspecified: Secondary | ICD-10-CM

## 2019-02-04 DIAGNOSIS — G20 Parkinson's disease: Secondary | ICD-10-CM

## 2019-02-04 NOTE — Progress Notes (Signed)
[facility] Room Number: 336-P Place of Service:  SNF ([phone]) Provider:  Kenard GowerMedina-Vargas, Kayron Kalmar, NP  Patient Care Team: Lauro RegulusAnderson, Marshall W, MD as PCP - General (Internal Medicine) Sharee HolsterGreen, Deborah S, NP as Nurse Practitioner (Geriatric Medicine)  Extended Emergency Contact Information Primary Emergency Contact: Marva PandaGilliam,Polly  [place] Home Phone: [phone] Mobile Phone: [phone] Relation: Spouse Secondary Emergency Contact: Gwynneth MunsonGilliam,Jeffery  [place] Mobile Phone: [phone] Relation: Son  Code Status:  DNR  Goals of care: Advanced Directive information Advanced Directives 01/30/2019  Does Patient Have a Medical Advance Directive? Yes  Type of Advance Directive Out of facility DNR (pink MOST or yellow form)  Does patient want to make changes to medical advance directive? No - Patient declined  Copy of Healthcare Power of Attorney in Chart? -  Would patient like information on creating a medical advance directive? -  Pre-existing out of facility DNR order (yellow form or pink MOST form) -     Chief Complaint  Patient presents with  . Medical Management of Chronic Issues    Routine [facility] visit    HPI:  Pt is an 85 y.o. male seen today for medical management of chronic diseases.  [facility] is a [facility].  He has a PMH of anxiety, arthritis, DVT, hypertension, and depression. He was seen in his room today. He was seen in his room today. He takes Norco Q 8 hours for pain. He verbalized that his pain is 2/10 on his right hip. Earlier he was seen walking with a walker  to the dining area. BPs are stable - 139/71, 126/52.   Past Medical History:  Diagnosis Date  . Anxiety   . Arthritis   . Blood transfusion    hx of autologus transfusion  . Cataract cortical, senile   . Chicken pox   . Clotting disorder (HCC)   . Complete heart block (HCC)   . Depression    unspecified  . DVT (deep venous thrombosis) (HCC) 09/2014   left leg  . GERD (gastroesophageal reflux disease)   . Hereditary and idiopathic neuropathy   . History of depression   . Hypertension   . MCI (mild cognitive impairment) 10/22/2013  . Memory deficit 03/24/2011  . Memory loss   . Movement disorder   . Neuromuscular disorder (HCC)    peripheral  neuropathy  . Neuropathy   . Osteoarthritis   . Other hammer toe(s) (acquired), unspecified foot   . Pacemaker   . Parkinson's disease (HCC)   . Parkinsonism (HCC) 10/22/2013  . Shortness of breath   . Spinal stenosis   . Synovitis and tenosynovitis, unspecified   . Third degree heart block (HCC)   . Tremor    Past Surgical History:  Procedure Laterality Date  . CATARACT EXTRACTION  06/2005  . COLONOSCOPY  06/13/2006   PH colon polyps (MUS) repeat 7/20  . CORRECTION HAMMER TOE    . defibrilator  04/04/2012   Dual Chamber  . INSERT / REPLACE / REMOVE PACEMAKER  04/04/2012  . JOINT REPLACEMENT Right    THA  . JOINT REPLACEMENT Bilateral    TKA  . KNEE ARTHROSCOPY Bilateral    Right x 2 and Left  . KNEE SURGERY  [phone]  . LEAD REVISION N/A 04/05/2012   Procedure: LEAD REVISION;  Surgeon: Duke SalviaSteven C Klein, MD;  [facility];  Service: Cardiovascular;  Laterality: N/A;  . PERMANENT  PACEMAKER INSERTION N/A 04/04/2012   Procedure: PERMANENT PACEMAKER INSERTION;  Surgeon: Marinus Maw, MD;  Location: [facility] CATH LAB;  Service: Cardiovascular;  Laterality: N/A;  . REPLACEMENT TOTAL HIP W/  RESURFACING IMPLANTS    . REPLACEMENT TOTAL KNEE BILATERAL  1997  . ROTATOR CUFF REPAIR  06/2008  . ROTATOR CUFF REPAIR Left 2010  . SHOULDER ARTHROSCOPY Left 06/19/2008   WITH SUBACROMIAL DECOMPRESSION. DISTAL CLAVICLE EXCISION. ARTHROSCOPIC ROTATOR CUFF REPAIR.  Marland Kitchen TOTAL HIP ARTHROPLASTY  2009   right  . UMBILICAL HERNIA REPAIR  2001  . VASECTOMY      Allergies  Allergen Reactions  . Citalopram     Other reaction(s): Unknown   . Codeine Nausea And Vomiting  . Sertraline     Other reaction(s): Unknown Other reaction(s): Unknown  . Lodine [Etodolac] Rash  . Percocet [Oxycodone-Acetaminophen] Rash    Tolerates tylenol Patient is able to take this recently    Outpatient Encounter Medications as of 02/04/2019  Medication Sig  . acetaminophen (TYLENOL) 325 MG tablet Take 650 mg by mouth 4 (four) times daily as needed. Max of 3000 mg of Apap in 24 hrs  . alum & mag hydroxide-simeth (MAALOX PLUS) 400-400-40 MG/5ML suspension Take 30 mLs by mouth every 4 (four) hours as needed for indigestion.  . bisacodyl (BISCOLAX) 10 MG suppository Place 10 mg rectally 2 (two) times daily as needed for moderate constipation. Give until BM.  . carbidopa-levodopa (SINEMET IR) 25-250 MG tablet Take 1 tablet by mouth 2 (two) times daily.   . cetirizine (ZYRTEC) 10 MG tablet Take 10 mg by mouth at bedtime.  Marland Kitchen desvenlafaxine (PRISTIQ) 50 MG 24 hr tablet Take 1 tablet (50 mg total) by mouth daily.  . fluticasone (FLONASE) 50 MCG/ACT nasal spray Place 1 spray into both nostrils daily.   Marland Kitchen guaiFENesin (ROBITUSSIN) 100 MG/5ML liquid Take 200 mg by mouth every 4 (four) hours as needed for cough. Notify provider if cough worsens/ continues longer than 3 days.  Marland Kitchen HYDROcodone-acetaminophen (NORCO/VICODIN) 5-325 MG tablet Take 1 tablet by mouth every 8 (eight) hours.  Marland Kitchen ipratropium (ATROVENT) 0.03 % nasal spray Place 2 sprays into both nostrils every 12 (twelve) hours.  Marland Kitchen ipratropium-albuterol (DUONEB) 0.5-2.5 (3) MG/3ML SOLN Take 3 mLs by nebulization every 6 (six) hours as needed (wheezing and shortness of breath with cough).  Marland Kitchen lisinopril (PRINIVIL,ZESTRIL) 40 MG tablet TAKE ONE TABLET BY MOUTH EVERY DAY  . LORazepam (ATIVAN) 0.5 MG tablet Take 1 tablet (0.5 mg total) by mouth every 12 (twelve) hours as needed for anxiety. 0.5 mg qhs scheduled and 0.5 mg once daily prn  . magnesium hydroxide (MILK OF MAGNESIA) 400 MG/5ML suspension Take 30 mLs by  mouth every 4 (four) hours as needed. If constipation/ no BM for 2 days  . Melatonin 3 MG TABS Take 1 tablet by mouth at bedtime. Should take 1 - 2 hours before sleep  . NON FORMULARY Diet Type: Regular diet  . ondansetron (ZOFRAN) 4 MG tablet Take 4 mg by mouth every 6 (six) hours as needed for nausea or vomiting.  . OXYGEN Inhale 2 L into the lungs as needed. Check O2 sat prior to placing on patient and at least every 4 hours after applying. Notify MD if O2 sat drop below baseline while receiving oxygen or no improvement in dyspnea  . pantoprazole (PROTONIX) 40 MG tablet Take 40 mg by mouth daily.  . Rivaroxaban (XARELTO) 15 MG TABS tablet Take 15 mg by mouth daily with supper.  for anticoagulation. Monitor for bleeding/bruising.  . sennosides-docusate sodium (SENOKOT-S) 8.6-50 MG tablet Take 2 tablets by mouth 2 (two) times daily.   . Skin Protectants, Misc. (ENDIT EX) Apply 1 application topically as needed (Skin irritation).  . sodium phosphate (FLEET) enema Place 1 enema rectally every three (3) days as needed. If constipation not relieved by milk of magnesia or bisacodyl suppository. Follow package directions  . sorbitol 70 % solution Take 30 mLs by mouth every 2 (two) hours as needed. Constipation. Give PO Q 2 hours until pt has large BM. Give first dose now along with Bisacodyl suppository  . tamsulosin (FLOMAX) 0.4 MG CAPS capsule Take 0.4 mg by mouth daily after supper. 30 mins after supper  . [DISCONTINUED] Zinc Oxide 10 % OINT Apply liberal amount topically to areas of skin irritation as needed. Ok to leave at bedside.   No facility-administered encounter medications on file as of 02/04/2019.     Review of Systems  GENERAL: No change in appetite, no fatigue, no weight changes, no fever, chills or weakness MOUTH and THROAT: Denies oral discomfort, gingival pain or bleeding RESPIRATORY: no cough, SOB, DOE, wheezing, hemoptysis CARDIAC: No chest pain, edema or palpitations GI: No  abdominal pain, diarrhea, constipation, heart burn, nausea or vomiting NEUROLOGICAL: Denies dizziness, syncope, numbness, or headache PSYCHIATRIC: Denies feelings of depression or anxiety. No report of hallucinations, insomnia, paranoia, or agitation    Immunization History  Administered Date(s) Administered  . Influenza-Unspecified 10/20/2014, 09/10/2016, 09/02/2017, 10/12/2018  . PPD Test 12/05/2015, 11/26/2016, 11/11/2018  . Pneumococcal Polysaccharide-23 03/13/2014  . Pneumococcal-Unspecified 10/20/2014   Pertinent  Health Maintenance Due  Topic Date Due  . PNA vac Low Risk Adult (2 of 2 - PCV13) 03/16/2019 (Originally 10/21/2015)  . INFLUENZA VACCINE  Completed   Fall Risk  10/30/2018  Falls in the past year? 0     Vitals:   02/04/19 0836  BP: (!) 126/52  Pulse: 75  Resp: 16  Temp: (!) 97.5 F (36.4 C)  TempSrc: Oral  SpO2: 98%  Weight: 186 lb 11.2 oz (84.7 kg)  Height: 5\' 10"  (1.778 m)   Body mass index is 26.79 kg/m.  Physical Exam  GENERAL APPEARANCE: Well nourished. In no acute distress. Normal body habitus SKIN:  Skin is warm and dry.  MOUTH and THROAT: Lips are without lesions. Oral mucosa is moist and without lesions. Tongue is normal in shape, size, and color and without lesions RESPIRATORY: Breathing is even & unlabored, BS CTAB CARDIAC: RRR, no murmur,no extra heart sounds, no edema, Left chest pacemaker GI: Abdomen soft, normal BS, no masses, no tenderness EXTREMITIES:  Able to move X 4 extremities NEUROLOGICAL: Speech is clear. Alert and oriented X 3 PSYCHIATRIC:  Affect and behavior are appropriate   Labs reviewed: Recent Labs    03/12/18 1454 09/13/18 1425 12/27/18 0620  NA 138 141 139  K 3.9 4.4 4.8  CL 104 106 107  CO2 26 28 27   GLUCOSE 115* 116* 101*  BUN 18 20 20   CREATININE 0.92 1.12 0.91  CALCIUM 9.1 9.0 8.8*   Recent Labs    03/12/18 1454 09/13/18 1425 12/27/18 0620  AST 15 14* 14*  ALT 7* 8 8  ALKPHOS 63 59 64    BILITOT 0.9 0.9 0.9  PROT 6.6 6.4* 6.2*  ALBUMIN 4.2 4.2 3.9   Recent Labs    08/24/18 1503 09/13/18 1425 12/27/18 0620  WBC 5.7 5.4 5.9  NEUTROABS 2.6 3.1 2.6  HGB 13.4 12.6* 12.8*  HCT 40.0 37.9* 40.4  MCV 103.0* 103.3* 107.4*  PLT 122* 147* 137*   Lab Results  Component Value Date   TSH 1.231 09/13/2018    Assessment/Plan  1. Essential hypertension -Well-controlled, continue lisinopril 40 mg 1 tab daily  2. Chronic deep vein thrombosis (DVT) of proximal vein of left lower extremity (HCC) -Continue Xarelto 15 mg 1 tab daily  3. Osteoarthritis involving multiple joints on both sides of body -Right hip pain 2/10, will decrease Norco 5-325 mg from 1 tab every 8 hours to every morning and at bedtime  4. Parkinson's disease (HCC) -Continue carbidopa-levodopa 25-250 mg 1 tab twice a day  5. Major depression, recurrent, chronic (HCC) -Mood is stable, continue Pristiq ER 50 mg 1 tab daily  6. Other insomnia -Verbalized sleeping well at night, continue melatonin 3 mg 1 tab at bedtime  7.  Anxiety -Continue Lorazepam 0.5 mg 1 tab every 12 hours as needed    Family/ staff Communication: Discussed plan of care with resident.  Labs/tests ordered:  None  Goals of care:   Long-term care.   Kenard Gower, NP [facility] and Adult Medicine [phone] (Monday-Friday 8:00 a.m. - 5:00 p.m.) [phone] (after hours)

## 2019-02-05 NOTE — Progress Notes (Signed)
Remote pacemaker transmission.   

## 2019-02-13 ENCOUNTER — Encounter
Admission: RE | Admit: 2019-02-13 | Discharge: 2019-02-13 | Disposition: A | Discharge status: Home / Self Care | Payer: Medicare Other | Source: Ambulatory Visit | Attending: Internal Medicine | Admitting: Internal Medicine

## 2019-02-13 ENCOUNTER — Other Ambulatory Visit: Payer: Self-pay | Admitting: Adult Health

## 2019-02-13 MED ORDER — LORAZEPAM 0.5 MG PO TABS: 0.5000 mg | ORAL_TABLET | Freq: Two times a day (BID) | ORAL | 0 refills | Status: AC | PRN

## 2019-02-13 NOTE — Telephone Encounter (Signed)
Refill request completed and pended, forwarded to Kenard Gower, NP for approval and transmittal to [facility]. Patient prefers this pharmacy instead of the facility pharmacy [facility]).

## 2019-02-14 ENCOUNTER — Encounter: Payer: Self-pay | Admitting: Adult Health

## 2019-02-14 ENCOUNTER — Non-Acute Institutional Stay (SKILLED_NURSING_FACILITY): Payer: Medicare Other | Admitting: Adult Health

## 2019-02-14 DIAGNOSIS — F419 Anxiety disorder, unspecified: Secondary | ICD-10-CM

## 2019-02-14 DIAGNOSIS — F339 Major depressive disorder, recurrent, unspecified: Secondary | ICD-10-CM | POA: Diagnosis not present

## 2019-02-14 DIAGNOSIS — G8929 Other chronic pain: Secondary | ICD-10-CM | POA: Diagnosis not present

## 2019-02-14 MED ORDER — HYDROCODONE-ACETAMINOPHEN 5-325 MG PO TABS: 1.0000 | ORAL_TABLET | Freq: Two times a day (BID) | ORAL | 0 refills | Status: AC

## 2019-02-14 NOTE — Progress Notes (Signed)
[facility] Room Number: 336-P Place of Service:  SNF [phone]) Provider:  Kenard Gower, NP  Patient Care Team: Lauro Regulus, MD as PCP - General (Internal Medicine) Sharee Holster, NP as Nurse Practitioner (Geriatric Medicine)  Extended Emergency Contact Information Primary Emergency Contact: Marva Panda [place] of [place] Home Phone: [phone] Mobile Phone: [phone] Relation: Spouse Secondary Emergency Contact: Gwynneth Munson [place] Mobile Phone: [phone] Relation: Son  Code Status:  DNR  Goals of care: Advanced Directive information Advanced Directives 02/14/2019  Does Patient Have a Medical Advance Directive? Yes  Type of Advance Directive Out of facility DNR (pink MOST or yellow form)  Does patient want to make changes to medical advance directive? No - Patient declined  Copy of Healthcare Power of Attorney in Chart? -  Would patient like information on creating a medical advance directive? -  Pre-existing out of facility DNR order (yellow form or pink MOST form) -     Chief Complaint  Patient presents with  . Acute Visit    Patient is seen for medication management.    HPI:  Pt is an 85 y.o. male seen today for medication management, to assess for the continued need for Norco 5-325 q12h and Ativan q12h prn. He is a long-term care resident of [facility]. He has a PMH of anxiety, arthritis, DVT, HTN, and depression. He was seen today and evaluated for his PRN Ativan and Norco BID. He verbalized having occasional anxiety, especially when he doesn't know what is going on. He said that his bilateral hips and knees pain is 4/10, stable. He walks to the dining room with assistance and gait belt. He takes Pristiq for depression. He verbalized having good mood today. He denies having depression.   Past Medical History:  Diagnosis Date  . Anxiety   . Arthritis   . Blood transfusion    hx  of autologus transfusion  . Cataract cortical, senile   . Chicken pox   . Clotting disorder (HCC)   . Complete heart block (HCC)   . Depression    unspecified  . DVT (deep venous thrombosis) (HCC) 09/2014   left leg  . GERD (gastroesophageal reflux disease)   . Hereditary and idiopathic neuropathy   . History of depression   . Hypertension   . MCI (mild cognitive impairment) 10/22/2013  . Memory deficit 03/24/2011  . Memory loss   . Movement disorder   . Neuromuscular disorder (HCC)    peripheral  neuropathy  . Neuropathy   . Osteoarthritis   . Other hammer toe(s) (acquired), unspecified foot   . Pacemaker   . Parkinson's disease (HCC)   . Parkinsonism (HCC) 10/22/2013  . Shortness of breath   . Spinal stenosis   . Synovitis and tenosynovitis, unspecified   . Third degree heart block (HCC)   . Tremor    Past Surgical History:  Procedure Laterality Date  . CATARACT EXTRACTION  06/2005  . COLONOSCOPY  06/13/2006   PH colon polyps (MUS) repeat 7/20  . CORRECTION HAMMER TOE    . defibrilator  04/04/2012   Dual Chamber  . INSERT / REPLACE / REMOVE PACEMAKER  04/04/2012  . JOINT REPLACEMENT Right    THA  . JOINT REPLACEMENT Bilateral    TKA  . KNEE ARTHROSCOPY Bilateral    Right x 2 and Left  . KNEE SURGERY  [phone]  . LEAD REVISION N/A 04/05/2012   Procedure: LEAD REVISION;  Surgeon: Duke Salvia, MD;  Location: [facility] CATH LAB;  Service: Cardiovascular;  Laterality: N/A;  . PERMANENT PACEMAKER INSERTION N/A 04/04/2012   Procedure: PERMANENT PACEMAKER INSERTION;  Surgeon: Marinus Maw, MD;  Location: [facility] CATH LAB;  Service: Cardiovascular;  Laterality: N/A;  . REPLACEMENT TOTAL HIP W/  RESURFACING IMPLANTS    . REPLACEMENT TOTAL KNEE BILATERAL  1997  . ROTATOR CUFF REPAIR  06/2008  . ROTATOR CUFF REPAIR Left 2010  . SHOULDER ARTHROSCOPY Left 06/19/2008   WITH SUBACROMIAL DECOMPRESSION. DISTAL CLAVICLE EXCISION. ARTHROSCOPIC ROTATOR CUFF REPAIR.  Marland Kitchen TOTAL HIP  ARTHROPLASTY  2009   right  . UMBILICAL HERNIA REPAIR  2001  . VASECTOMY      Allergies  Allergen Reactions  . Citalopram     Other reaction(s): Unknown  . Codeine Nausea And Vomiting  . Sertraline     Other reaction(s): Unknown Other reaction(s): Unknown  . Lodine [Etodolac] Rash  . Percocet [Oxycodone-Acetaminophen] Rash    Tolerates tylenol Patient is able to take this recently    Outpatient Encounter Medications as of 02/14/2019  Medication Sig  . acetaminophen (TYLENOL) 325 MG tablet Take 650 mg by mouth 4 (four) times daily as needed for fever. Max of 3000 mg of Apap in 24 hrs  . alum & mag hydroxide-simeth (MAALOX PLUS) 400-400-40 MG/5ML suspension Take 30 mLs by mouth every 4 (four) hours as needed for indigestion.  . bisacodyl (BISCOLAX) 10 MG suppository Place 10 mg rectally 2 (two) times daily as needed for moderate constipation. Give until BM.  . carbidopa-levodopa (SINEMET IR) 25-250 MG tablet Take 1 tablet by mouth 2 (two) times daily.   . cetirizine (ZYRTEC) 10 MG tablet Take 10 mg by mouth at bedtime.  Marland Kitchen desvenlafaxine (PRISTIQ) 50 MG 24 hr tablet Take 1 tablet (50 mg total) by mouth daily.  . fluticasone (FLONASE) 50 MCG/ACT nasal spray Place 1 spray into both nostrils daily.   Marland Kitchen HYDROcodone-acetaminophen (NORCO/VICODIN) 5-325 MG tablet Take 1 tablet by mouth every 12 (twelve) hours.  Marland Kitchen ipratropium (ATROVENT) 0.03 % nasal spray Place 2 sprays into both nostrils every 12 (twelve) hours.  Marland Kitchen ipratropium-albuterol (DUONEB) 0.5-2.5 (3) MG/3ML SOLN Take 3 mLs by nebulization every 6 (six) hours as needed (wheezing and shortness of breath with cough).  Marland Kitchen lisinopril (PRINIVIL,ZESTRIL) 40 MG tablet TAKE ONE TABLET BY MOUTH EVERY DAY  . LORazepam (ATIVAN) 0.5 MG tablet Take 1 tablet (0.5 mg total) by mouth every 12 (twelve) hours as needed for up to 14 days for anxiety.  . magnesium hydroxide (MILK OF MAGNESIA) 400 MG/5ML suspension Take 30 mLs by mouth every 4 (four) hours as  needed. If constipation/ no BM for 2 days  . Melatonin 3 MG TABS Take 1 tablet by mouth at bedtime. Should take 1 - 2 hours before sleep  . NON FORMULARY Diet Type: Regular diet  . ondansetron (ZOFRAN) 4 MG tablet Take 4 mg by mouth every 6 (six) hours as needed for nausea or vomiting.  . OXYGEN Inhale 2 L into the lungs as needed. Check O2 sat prior to placing on patient and at least every 4 hours after applying. Notify MD if O2 sat drop below baseline while receiving oxygen or no improvement in dyspnea  . pantoprazole (PROTONIX) 40 MG tablet Take 40 mg by mouth daily.  . Rivaroxaban (XARELTO) 15 MG TABS tablet Take 15 mg by mouth daily with supper. for anticoagulation. Monitor for bleeding/bruising.  . sennosides-docusate sodium (SENOKOT-S) 8.6-50 MG tablet Take  2 tablets by mouth 2 (two) times daily.   . Skin Protectants, Misc. (ENDIT EX) Apply 1 application topically as needed (Skin irritation).  . sodium phosphate (FLEET) enema Place 1 enema rectally every three (3) days as needed. If constipation not relieved by milk of magnesia or bisacodyl suppository. Follow package directions  . sorbitol 70 % solution Take 30 mLs by mouth every 2 (two) hours as needed. Constipation. Give PO Q 2 hours until pt has large BM. Give first dose now along with Bisacodyl suppository  . tamsulosin (FLOMAX) 0.4 MG CAPS capsule Take 0.4 mg by mouth daily after supper. 30 mins after supper  . [DISCONTINUED] guaiFENesin (ROBITUSSIN) 100 MG/5ML liquid Take 200 mg by mouth every 4 (four) hours as needed for cough. Notify provider if cough worsens/ continues longer than 3 days.  . [DISCONTINUED] HYDROcodone-acetaminophen (NORCO/VICODIN) 5-325 MG tablet Take 1 tablet by mouth every 8 (eight) hours.   No facility-administered encounter medications on file as of 02/14/2019.     Review of Systems  GENERAL: No change in appetite, no fatigue, no weight changes, no fever, chills or weakness MOUTH and THROAT: Denies oral  discomfort, gingival pain or bleeding RESPIRATORY: no cough, SOB, DOE, wheezing, hemoptysis CARDIAC: No chest pain, edema or palpitations GI: No abdominal pain, diarrhea, constipation, heart burn, nausea or vomiting GU: Denies dysuria, frequency, hematuria, or discharge NEUROLOGICAL: Denies dizziness, syncope, numbness, or headache PSYCHIATRIC: Denies feelings of depression or anxiety. No report of hallucinations, insomnia, paranoia, or agitation   Immunization History  Administered Date(s) Administered  . Influenza-Unspecified 10/20/2014, 09/10/2016, 09/02/2017, 10/12/2018  . PPD Test 12/05/2015, 11/26/2016, 11/11/2018  . Pneumococcal Polysaccharide-23 03/13/2014  . Pneumococcal-Unspecified 10/20/2014   Pertinent  Health Maintenance Due  Topic Date Due  . PNA vac Low Risk Adult (2 of 2 - PCV13) 03/16/2019 (Originally 10/21/2015)  . INFLUENZA VACCINE  Completed   Fall Risk  10/30/2018  Falls in the past year? 0     Vitals:   02/14/19 0845  Weight: 187 lb 9.6 oz (85.1 kg)  Height:  (1.778 m)   Body mass index is 26.92 kg/m.  Physical Exam  GENERAL APPEARANCE: Well nourished. In no acute distress. Normal body habitus SKIN:  Skin is warm and dry.  MOUTH and THROAT: Lips are without lesions. Oral mucosa is moist and without lesions. Tongue is normal in shape, size, and color and without lesions RESPIRATORY: Breathing is even & unlabored, BS CTAB CARDIAC: RRR, no murmur,no extra heart sounds, no edema, Left chest pacemaker GI: Abdomen soft, normal BS, no masses, no tenderness EXTREMITIES:  Able to move X 4 extremities NEUROLOGICAL: There is no tremor. Speech is clear. Alert to self, disoriented to time and place. PSYCHIATRIC:  Affect and behavior are appropriate   Labs reviewed: Recent Labs    03/12/18 1454 09/13/18 1425 12/27/18 0620  NA 138 141 139  K 3.9 4.4 4.8  CL 104 106 107  CO2 GLUCOSE 115* 116* 101*  BUN CREATININE 0.92 1.12 0.91   CALCIUM 9.1 9.0 8.8*   Recent Labs    03/12/18 1454 09/13/18 1425 12/27/18 0620  AST 15 14* 14*  ALT 7* 8 8  ALKPHOS 63 59 64  BILITOT 0.9 0.9 0.9  PROT 6.6 6.4* 6.2*  ALBUMIN 4.2 4.2 3.9   Recent Labs    08/24/18 1503 09/13/18 1425 12/27/18 0620  WBC 5.7 5.4 5.9  NEUTROABS 2.6 3.1 2.6  HGB 13.4 12.6* 12.8*  HCT 40.0 37.9* 40.4  MCV 103.0* 103.3* 107.4*  PLT 122* 147* 137*   Lab Results  Component Value Date   TSH 1.231 09/13/2018    Assessment/Plan  1. Anxiety -Has occasional anxiety, continue Ativan 0.5 mg 1 tab every 12 hours as needed  2. Other chronic pain -Bilateral hips and knees pain 4/10, continue Norco 5-325 mg 1 tab every 12 hours  3. Major depression, recurrent, chronic (HCC) -Mood is stable, continue Pristiq   Family/ staff Communication: Discussed plan of care with resident.  Labs/tests ordered: None  Goals of care:   Long-term care.   Kenard Gower, NP [facility] [phone] (Monday-Friday 8:00 a.m. - 5:00 p.m.) [phone] (after hours)

## 2019-02-25 ENCOUNTER — Non-Acute Institutional Stay (SKILLED_NURSING_FACILITY): Payer: Medicare Other | Admitting: Adult Health

## 2019-02-25 ENCOUNTER — Encounter: Payer: Self-pay | Admitting: Adult Health

## 2019-02-25 DIAGNOSIS — G8929 Other chronic pain: Secondary | ICD-10-CM

## 2019-02-25 DIAGNOSIS — I825Y2 Chronic embolism and thrombosis of unspecified deep veins of left proximal lower extremity: Secondary | ICD-10-CM

## 2019-02-25 DIAGNOSIS — G20 Parkinson's disease: Secondary | ICD-10-CM

## 2019-02-25 DIAGNOSIS — L219 Seborrheic dermatitis, unspecified: Secondary | ICD-10-CM | POA: Diagnosis not present

## 2019-02-25 DIAGNOSIS — G4709 Other insomnia: Secondary | ICD-10-CM

## 2019-02-25 DIAGNOSIS — J309 Allergic rhinitis, unspecified: Secondary | ICD-10-CM

## 2019-02-25 DIAGNOSIS — F418 Other specified anxiety disorders: Secondary | ICD-10-CM

## 2019-02-25 NOTE — Progress Notes (Signed)
Location:  [facility] Room Number: 336-P Place of Service:  SNF ([phone]) Provider:  Kenard Gower, NP  Patient Care Team: Lauro Regulus, MD as PCP - General (Internal Medicine) Sharee Holster, NP as Nurse Practitioner (Geriatric Medicine)  Extended Emergency Contact Information Primary Emergency Contact: Marva Panda [place] of [place] Home Phone: [phone] Mobile Phone: [phone] Relation: Spouse Secondary Emergency Contact: Gwynneth Munson [place] Mobile Phone: [phone] Relation: Son  Code Status:  DNR  Goals of care: Advanced Directive information Advanced Directives 02/14/2019  Does Patient Have a Medical Advance Directive? Yes  Type of Advance Directive Out of facility DNR (pink MOST or yellow form)  Does patient want to make changes to medical advance directive? No - Patient declined  Copy of Healthcare Power of Attorney in Chart? -  Would patient like information on creating a medical advance directive? -  Pre-existing out of facility DNR order (yellow form or pink MOST form) -     Chief Complaint  Patient presents with  . Medical Management of Chronic Issues    Routine [facility] visit    HPI:  Pt is an 85 y.o. male seen today for medical management of chronic diseases.  He is a long-term care resident of [facility]. He has a PMH of anxiety, arthritis, DVT, HTN, and depression. He was seen in the room today. He was seen sitting in his recliner. He verbalized that his chronic pain (left hip and bilateral knees) is 3/10. He currently takes Norco 5-325mg  Q 12 hours.. He denies sneezing. He is currently taking Cetirizine and Fluticasone for allergic rhinitis. He was recently ordered Ketoconazole 2% cream for Seborrhea on his scalp.   Past Medical History:  Diagnosis Date  . Anxiety   . Arthritis   . Blood transfusion    hx of autologus transfusion  . Cataract cortical, senile   . Chicken pox   .  Clotting disorder (HCC)   . Complete heart block (HCC)   . Depression    unspecified  . DVT (deep venous thrombosis) (HCC) 09/2014   left leg  . GERD (gastroesophageal reflux disease)   . Hereditary and idiopathic neuropathy   . History of depression   . Hypertension   . MCI (mild cognitive impairment) 10/22/2013  . Memory deficit 03/24/2011  . Memory loss   . Movement disorder   . Neuromuscular disorder (HCC)    peripheral  neuropathy  . Neuropathy   . Osteoarthritis   . Other hammer toe(s) (acquired), unspecified foot   . Pacemaker   . Parkinson's disease (HCC)   . Parkinsonism (HCC) 10/22/2013  . Shortness of breath   . Spinal stenosis   . Synovitis and tenosynovitis, unspecified   . Third degree heart block (HCC)   . Tremor    Past Surgical History:  Procedure Laterality Date  . CATARACT EXTRACTION  06/2005  . COLONOSCOPY  06/13/2006   PH colon polyps (MUS) repeat 7/20  . CORRECTION HAMMER TOE    . defibrilator  04/04/2012   Dual Chamber  . INSERT / REPLACE / REMOVE PACEMAKER  04/04/2012  . JOINT REPLACEMENT Right    THA  . JOINT REPLACEMENT Bilateral    TKA  . KNEE ARTHROSCOPY Bilateral    Right x 2 and Left  . KNEE SURGERY  [id number]  . LEAD REVISION N/A 04/05/2012   Procedure: LEAD REVISION;  Surgeon: Duke Salvia, MD;  Location: [facility] CATH LAB;  Service: Cardiovascular;  Laterality: N/A;  . PERMANENT PACEMAKER INSERTION N/A 04/04/2012   Procedure: PERMANENT PACEMAKER INSERTION;  Surgeon: Marinus Maw, MD;  Location: [facility] CATH LAB;  Service: Cardiovascular;  Laterality: N/A;  . REPLACEMENT TOTAL HIP W/  RESURFACING IMPLANTS    . REPLACEMENT TOTAL KNEE BILATERAL  1997  . ROTATOR CUFF REPAIR  06/2008  . ROTATOR CUFF REPAIR Left 2010  . SHOULDER ARTHROSCOPY Left 06/19/2008   WITH SUBACROMIAL DECOMPRESSION. DISTAL CLAVICLE EXCISION. ARTHROSCOPIC ROTATOR CUFF REPAIR.  Marland Kitchen TOTAL HIP ARTHROPLASTY  2009   right  . UMBILICAL HERNIA REPAIR  2001  . VASECTOMY       Allergies  Allergen Reactions  . Citalopram     Other reaction(s): Unknown  . Codeine Nausea And Vomiting  . Sertraline     Other reaction(s): Unknown Other reaction(s): Unknown  . Lodine [Etodolac] Rash  . Percocet [Oxycodone-Acetaminophen] Rash    Tolerates tylenol Patient is able to take this recently    Outpatient Encounter Medications as of 02/25/2019  Medication Sig  . acetaminophen (TYLENOL) 325 MG tablet Take 650 mg by mouth 4 (four) times daily as needed for fever. Max of 3000 mg of Apap in 24 hrs  . alum & mag hydroxide-simeth (MAALOX PLUS) 400-400-40 MG/5ML suspension Take 30 mLs by mouth every 4 (four) hours as needed for indigestion.  . bisacodyl (BISCOLAX) 10 MG suppository Place 10 mg rectally 2 (two) times daily as needed for moderate constipation. Give until BM.  . carbidopa-levodopa (SINEMET IR) 25-250 MG tablet Take 1 tablet by mouth 2 (two) times daily.   . cetirizine (ZYRTEC) 10 MG tablet Take 10 mg by mouth at bedtime.  Marland Kitchen desvenlafaxine (PRISTIQ) 50 MG 24 hr tablet Take 1 tablet (50 mg total) by mouth daily.  . fluticasone (FLONASE) 50 MCG/ACT nasal spray Place 1 spray into both nostrils daily.   Marland Kitchen HYDROcodone-acetaminophen (NORCO/VICODIN) 5-325 MG tablet Take 1 tablet by mouth every 12 (twelve) hours for 30 days.  Marland Kitchen ipratropium (ATROVENT) 0.03 % nasal spray Place 2 sprays into both nostrils every 12 (twelve) hours.  Marland Kitchen ipratropium-albuterol (DUONEB) 0.5-2.5 (3) MG/3ML SOLN Take 3 mLs by nebulization every 6 (six) hours as needed (wheezing and shortness of breath with cough).  Marland Kitchen ketoconazole (NIZORAL) 2 % cream Apply 1 application topically 2 (two) times daily.  Marland Kitchen lisinopril (PRINIVIL,ZESTRIL) 40 MG tablet TAKE ONE TABLET BY MOUTH EVERY DAY  . LORazepam (ATIVAN) 0.5 MG tablet Take 1 tablet (0.5 mg total) by mouth every 12 (twelve) hours as needed for up to 14 days for anxiety.  . magnesium hydroxide (MILK OF MAGNESIA) 400 MG/5ML suspension Take 30 mLs by mouth every  4 (four) hours as needed. If constipation/ no BM for 2 days  . Melatonin 3 MG TABS Take 1 tablet by mouth at bedtime. Should take 1 - 2 hours before sleep  . NON FORMULARY Diet Type: Regular diet  . ondansetron (ZOFRAN) 4 MG tablet Take 4 mg by mouth every 6 (six) hours as needed for nausea or vomiting.  . OXYGEN Inhale 2 L into the lungs as needed. Check O2 sat prior to placing on patient and at least every 4 hours after applying. Notify MD if O2 sat drop below baseline while receiving oxygen or no improvement in dyspnea  . pantoprazole (PROTONIX) 40 MG tablet Take 40 mg by mouth daily.  . Rivaroxaban (XARELTO) 15 MG TABS tablet Take 15 mg by mouth daily with supper. for anticoagulation. Monitor for bleeding/bruising.  . sennosides-docusate sodium (SENOKOT-S)  8.6-50 MG tablet Take 2 tablets by mouth 2 (two) times daily.   . Skin Protectants, Misc. (ENDIT EX) Apply 1 application topically as needed (Skin irritation).  . sodium phosphate (FLEET) enema Place 1 enema rectally every three (3) days as needed. If constipation not relieved by milk of magnesia or bisacodyl suppository. Follow package directions  . sorbitol 70 % solution Take 30 mLs by mouth every 2 (two) hours as needed. Constipation. Give PO Q 2 hours until pt has large BM. Give first dose now along with Bisacodyl suppository  . tamsulosin (FLOMAX) 0.4 MG CAPS capsule Take 0.4 mg by mouth daily after supper. 30 mins after supper   No facility-administered encounter medications on file as of 02/25/2019.     Review of Systems  GENERAL: No change in appetite, no fatigue, no weight changes, no fever, chills or weakness MOUTH and THROAT: Denies oral discomfort, gingival pain or bleeding RESPIRATORY: no cough, SOB, DOE, wheezing, hemoptysis CARDIAC: No chest pain, edema or palpitations GI: No abdominal pain, diarrhea, constipation, heart burn, nausea or vomiting GU: Denies dysuria, frequency, hematuria, or discharge NEUROLOGICAL: Denies  dizziness, syncope, numbness, or headache PSYCHIATRIC: Denies feelings of depression or anxiety. No report of hallucinations, insomnia, paranoia, or agitation   Immunization History  Administered Date(s) Administered  . Influenza-Unspecified 10/20/2014, 09/10/2016, 09/02/2017, 10/12/2018  . PPD Test 12/05/2015, 11/26/2016, 11/11/2018  . Pneumococcal Polysaccharide-23 03/13/2014  . Pneumococcal-Unspecified 10/20/2014   Pertinent  Health Maintenance Due  Topic Date Due  . PNA vac Low Risk Adult (2 of 2 - PCV13) 03/16/2019 (Originally 10/21/2015)  . INFLUENZA VACCINE  Completed   Fall Risk  10/30/2018  Falls in the past year? 0     Vitals:   02/25/19 0933  BP: (!) 144/73  Pulse: 67  Resp: 18  Temp: (!) 97.5 F (36.4 C)  TempSrc: Oral  SpO2: 98%  Weight: 183 lb 6.4 oz (83.2 kg)  Height:  (1.778 m)   Body mass index is 26.32 kg/m.  Physical Exam  GENERAL APPEARANCE: Well nourished. In no acute distress. Normal body habitus SKIN:  Skin is warm and dry. There are no suspicious lesions or rash HEAD: Normal in size and contour. No evidence of trauma MOUTH and THROAT: Lips are without lesions. Oral mucosa is moist and without lesions. RESPIRATORY: Breathing is even & unlabored, BS CTAB CARDIAC: RRR, no murmur,no extra heart sounds, no edema, +left chest pacemaker GI: Abdomen soft, normal BS, no masses, no tenderness EXTREMITIES:  Able to move X 4 extremities NEUROLOGICAL: There is no tremor. Speech is clear. Alert and oriented X 3.  PSYCHIATRIC: Affect and behavior are appropriate   Labs reviewed: Recent Labs    03/12/18 1454 09/13/18 1425 12/27/18 0620  NA 138 141 139  K 3.9 4.4 4.8  CL 104 106 107  CO2 GLUCOSE 115* 116* 101*  BUN CREATININE 0.92 1.12 0.91  CALCIUM 9.1 9.0 8.8*   Recent Labs    03/12/18 1454 09/13/18 1425 12/27/18 0620  AST 15 14* 14*  ALT 7* 8 8  ALKPHOS 63 59 64  BILITOT 0.9 0.9 0.9  PROT 6.6 6.4* 6.2*   ALBUMIN 4.2 4.2 3.9   Recent Labs    08/24/18 1503 09/13/18 1425 12/27/18 0620  WBC 5.7 5.4 5.9  NEUTROABS 2.6 3.1 2.6  HGB 13.4 12.6* 12.8*  HCT 40.0 37.9* 40.4  MCV 103.0* 103.3* 107.4*  PLT 122* 147* 137*   Lab Results  Component Value Date   TSH 1.231 09/13/2018    Assessment/Plan  1. Seborrhea - scalp has dry and scaly rashes, will continue Ketoconazole2% cream topically BID  2. Parkinson's disease (HCC) -Stable, continue carbidopa-levodopa 25-250 mg 1 tab twice a day  3. Chronic deep vein thrombosis (DVT) of proximal vein of left lower extremity (HCC) -Continue Xarelto 15 mg 1 tab daily, no bruising noted  4. Other chronic pain -Verbalized 3/10 pain on his left hip and bilateral knees (osteoarthritis,multiple joints), continue Norco 5-325 mg 1 tab every 12 hours  5. Depression with anxiety -Mood is stable despite having no family visitors allowed, will continue Pristiq ER 50 mg 1 tab daily, has been using Ativan as needed almost daily, will continue Ativan 0.5 mg 1 tab every 12 hours as needed  6. Chronic allergic rhinitis -Stable, denies sneezing, continue cetirizine 10 mg 1 tab at bedtime and fluticasone 50 mcg/ACT 1 spray in each nostril daily  7. Other insomnia -Continue melatonin 3 mg 1 capsule at bedtime   Family/ staff Communication: Discussed plan of care with resident.  Labs/tests ordered:  None  Goals of care:   Long-term care.   Kenard Gower, NP [facility] [phone] (Monday-Friday 8:00 a.m. - 5:00 p.m.) [phone] (after hours)

## 2019-03-04 ENCOUNTER — Encounter: Payer: Self-pay | Admitting: Adult Health

## 2019-03-04 ENCOUNTER — Non-Acute Institutional Stay (SKILLED_NURSING_FACILITY): Payer: Medicare Other | Admitting: Adult Health

## 2019-03-04 ENCOUNTER — Other Ambulatory Visit: Payer: Self-pay | Admitting: Adult Health

## 2019-03-04 DIAGNOSIS — F419 Anxiety disorder, unspecified: Secondary | ICD-10-CM | POA: Diagnosis not present

## 2019-03-04 MED ORDER — LORAZEPAM 0.5 MG PO TABS: 0.5000 mg | ORAL_TABLET | Freq: Two times a day (BID) | ORAL | 0 refills | Status: DC | PRN

## 2019-03-04 MED ORDER — DESVENLAFAXINE SUCCINATE ER 50 MG PO TB24: 50.0000 mg | ORAL_TABLET | Freq: Every day | ORAL | 0 refills | Status: DC

## 2019-03-04 NOTE — Progress Notes (Signed)
[facility] Room Number: 336-P Place of Service:  SNF ([phone]) Provider:  Kenard Gower, NP  Patient Care Team: Lauro Regulus, MD as PCP - General (Internal Medicine) Sharee Holster, NP as Nurse Practitioner (Geriatric Medicine)  Extended Emergency Contact Information Primary Emergency Contact: Marva Panda [place] Home Phone: [phone] Mobile Phone: [phone] Relation: Spouse Secondary Emergency Contact: Gwynneth Munson [place] Mobile Phone: [phone] Relation: Son  Code Status:  DNR  Goals of care: Advanced Directive information Advanced Directives 02/25/2019  Does Patient Have a Medical Advance Directive? Yes  Type of Advance Directive Out of facility DNR (pink MOST or yellow form)  Does patient want to make changes to medical advance directive? No - Patient declined  Copy of Healthcare Power of Attorney in Chart? -  Would patient like information on creating a medical advance directive? -  Pre-existing out of facility DNR order (yellow form or pink MOST form) -     Chief Complaint  Patient presents with  . Acute Visit    Patient is seen for medication management.    HPI:  Pt is a 85 y.o. [name] seen today for medication management to assess his need for continued use of lorazepam. He is a long-term care resident of [facility]. He has a PMH of anxiety, arthritis, DVT, HTN, and depression. He has used PRN Ativan X 4 days in the past week. He was denies being anxious during the visit but admits being anxious at times.    Past Medical History:  Diagnosis Date  . Anxiety   . Arthritis   . Blood transfusion    hx of autologus transfusion  . Cataract cortical, senile   . Chicken pox   . Clotting disorder (HCC)   . Complete heart block (HCC)   . Depression    unspecified  . DVT (deep venous thrombosis) (HCC) 09/2014   left leg  . GERD (gastroesophageal reflux disease)   .  Hereditary and idiopathic neuropathy   . History of depression   . Hypertension   . MCI (mild cognitive impairment) 10/22/2013  . Memory deficit 03/24/2011  . Memory loss   . Movement disorder   . Neuromuscular disorder (HCC)    peripheral  neuropathy  . Neuropathy   . Osteoarthritis   . Other hammer toe(s) (acquired), unspecified foot   . Pacemaker   . Parkinson's disease (HCC)   . Parkinsonism (HCC) 10/22/2013  . Shortness of breath   . Spinal stenosis   . Synovitis and tenosynovitis, unspecified   . Third degree heart block (HCC)   . Tremor    Past Surgical History:  Procedure Laterality Date  . CATARACT EXTRACTION  06/2005  . COLONOSCOPY  06/13/2006   PH colon polyps (MUS) repeat 7/20  . CORRECTION HAMMER TOE    . defibrilator  04/04/2012   Dual Chamber  . INSERT / REPLACE / REMOVE PACEMAKER  04/04/2012  . JOINT REPLACEMENT Right    THA  . JOINT REPLACEMENT Bilateral    TKA  . KNEE ARTHROSCOPY Bilateral    Right x 2 and Left  . KNEE SURGERY  [phone]  . LEAD REVISION N/A 04/05/2012   Procedure: LEAD REVISION;  Surgeon: Duke Salvia, MD;  Location: [facility] CATH LAB;  Service: Cardiovascular;  Laterality: N/A;  . PERMANENT PACEMAKER INSERTION N/A 04/04/2012   Procedure: PERMANENT PACEMAKER INSERTION;  Surgeon: Marinus Maw, MD;  Location: [facility] CATH LAB;  Service:  Cardiovascular;  Laterality: N/A;  . REPLACEMENT TOTAL HIP W/  RESURFACING IMPLANTS    . REPLACEMENT TOTAL KNEE BILATERAL  1997  . ROTATOR CUFF REPAIR  06/2008  . ROTATOR CUFF REPAIR Left 2010  . SHOULDER ARTHROSCOPY Left 06/19/2008   WITH SUBACROMIAL DECOMPRESSION. DISTAL CLAVICLE EXCISION. ARTHROSCOPIC ROTATOR CUFF REPAIR.  Marland Kitchen TOTAL HIP ARTHROPLASTY  2009   right  . UMBILICAL HERNIA REPAIR  2001  . VASECTOMY      Allergies  Allergen Reactions  . Citalopram     Other reaction(s): Unknown  . Codeine Nausea And Vomiting  . Sertraline     Other reaction(s): Unknown Other reaction(s): Unknown  . Lodine  [Etodolac] Rash  . Percocet [Oxycodone-Acetaminophen] Rash    Tolerates tylenol Patient is able to take this recently    Outpatient Encounter Medications as of 03/04/2019  Medication Sig  . acetaminophen (TYLENOL) 325 MG tablet Take 650 mg by mouth 4 (four) times daily as needed for fever. Max of 3000 mg of Apap in 24 hrs  . alum & mag hydroxide-simeth (MAALOX PLUS) 400-400-40 MG/5ML suspension Take 30 mLs by mouth every 4 (four) hours as needed for indigestion.  . bisacodyl (BISCOLAX) 10 MG suppository Place 10 mg rectally 2 (two) times daily as needed for moderate constipation. Give until BM.  . carbidopa-levodopa (SINEMET IR) 25-250 MG tablet Take 1 tablet by mouth 2 (two) times daily.   . cetirizine (ZYRTEC) 10 MG tablet Take 10 mg by mouth at bedtime.  Marland Kitchen desvenlafaxine (PRISTIQ) 50 MG 24 hr tablet Take 1 tablet (50 mg total) by mouth daily.  . fluticasone (FLONASE) 50 MCG/ACT nasal spray Place 1 spray into both nostrils daily.   Marland Kitchen HYDROcodone-acetaminophen (NORCO/VICODIN) 5-325 MG tablet Take 1 tablet by mouth every 12 (twelve) hours for 30 days.  Marland Kitchen ipratropium (ATROVENT) 0.03 % nasal spray Place 2 sprays into both nostrils every 12 (twelve) hours.  Marland Kitchen ipratropium-albuterol (DUONEB) 0.5-2.5 (3) MG/3ML SOLN Take 3 mLs by nebulization every 6 (six) hours as needed (wheezing and shortness of breath with cough).  Marland Kitchen ketoconazole (NIZORAL) 2 % cream Apply 1 application topically 2 (two) times daily.  Marland Kitchen lisinopril (PRINIVIL,ZESTRIL) 40 MG tablet TAKE ONE TABLET BY MOUTH EVERY DAY  . LORazepam (ATIVAN) 0.5 MG tablet Take 0.5 mg by mouth every 12 (twelve) hours as needed for anxiety.  . magnesium hydroxide (MILK OF MAGNESIA) 400 MG/5ML suspension Take 30 mLs by mouth every 4 (four) hours as needed. If constipation/ no BM for 2 days  . Melatonin 3 MG TABS Take 1 tablet by mouth at bedtime. Should take 1 - 2 hours before sleep  . NON FORMULARY Diet Type: Regular diet  . ondansetron (ZOFRAN) 4 MG  tablet Take 4 mg by mouth every 6 (six) hours as needed for nausea or vomiting.  . OXYGEN Inhale 2 L into the lungs as needed. Check O2 sat prior to placing on patient and at least every 4 hours after applying. Notify MD if O2 sat drop below baseline while receiving oxygen or no improvement in dyspnea  . pantoprazole (PROTONIX) 40 MG tablet Take 40 mg by mouth daily.  . Rivaroxaban (XARELTO) 15 MG TABS tablet Take 15 mg by mouth daily with supper. for anticoagulation. Monitor for bleeding/bruising.  . sennosides-docusate sodium (SENOKOT-S) 8.6-50 MG tablet Take 2 tablets by mouth 2 (two) times daily.   . Skin Protectants, Misc. (ENDIT EX) Apply 1 application topically as needed (Skin irritation).  . sodium phosphate (FLEET) enema Place 1  enema rectally every three (3) days as needed. If constipation not relieved by milk of magnesia or bisacodyl suppository. Follow package directions  . sorbitol 70 % solution Take 30 mLs by mouth every 2 (two) hours as needed. Constipation. Give PO Q 2 hours until pt has large BM. Give first dose now along with Bisacodyl suppository  . tamsulosin (FLOMAX) 0.4 MG CAPS capsule Take 0.4 mg by mouth daily after supper. 30 mins after supper   No facility-administered encounter medications on file as of 03/04/2019.     Review of Systems  GENERAL: No change in appetite, no fatigue, no weight changes, no fever, chills or weakness MOUTH and THROAT: Denies oral discomfort, gingival pain or bleeding RESPIRATORY: no cough, SOB, DOE, wheezing, hemoptysis CARDIAC: No chest pain, edema or palpitations GI: No abdominal pain, diarrhea, constipation, heart burn, nausea or vomiting PSYCHIATRIC: +occasional anxiety  Immunization History  Administered Date(s) Administered  . Influenza-Unspecified 10/20/2014, 09/10/2016, 09/02/2017, 10/12/2018  . PPD Test 12/05/2015, 11/26/2016, 11/11/2018  . Pneumococcal Polysaccharide-23 03/13/2014  . Pneumococcal-Unspecified 10/20/2014    Pertinent  Health Maintenance Due  Topic Date Due  . PNA vac Low Risk Adult (2 of 2 - PCV13) 03/16/2019 (Originally 10/21/2015)  . INFLUENZA VACCINE  Completed   Fall Risk  10/30/2018  Falls in the past year? 0     Vitals:   03/04/19 0915  BP: (!) 144/73  Pulse: 67  Resp: 18  Temp: 97.8 F (36.6 C)  TempSrc: Oral  SpO2: 98%  Weight: 185 lb 9.6 oz (84.2 kg)  Height:  (1.778 m)   Body mass index is 26.63 kg/m.  Physical Exam  GENERAL APPEARANCE: Well nourished. In no acute distress. Normal body habitus SKIN:  Skin is warm and dry.  MOUTH and THROAT: Lips are without lesions. Oral mucosa is moist and without lesions. Tongue is normal in shape, size, and color and without lesions RESPIRATORY: Breathing is even & unlabored, BS CTAB CARDIAC: RRR, no murmur,no extra heart sounds, no edema, left chest pacemaker GI: Abdomen soft, normal BS, no masses, no tenderness EXTREMITIES: Able to move X 4 extremities NEUROLOGICAL: There is no tremor. Speech is clear. Alert and oriented X 3.  PSYCHIATRIC: Affect and behavior are appropriate   Labs reviewed: Recent Labs    03/12/18 1454 09/13/18 1425 12/27/18 0620  NA 138 141 139  K 3.9 4.4 4.8  CL 104 106 107  CO2 GLUCOSE 115* 116* 101*  BUN CREATININE 0.92 1.12 0.91  CALCIUM 9.1 9.0 8.8*   Recent Labs    03/12/18 1454 09/13/18 1425 12/27/18 0620  AST 15 14* 14*  ALT 7* 8 8  ALKPHOS 63 59 64  BILITOT 0.9 0.9 0.9  PROT 6.6 6.4* 6.2*  ALBUMIN 4.2 4.2 3.9   Recent Labs    08/24/18 1503 09/13/18 1425 12/27/18 0620  WBC 5.7 5.4 5.9  NEUTROABS 2.6 3.1 2.6  HGB 13.4 12.6* 12.8*  HCT 40.0 37.9* 40.4  MCV 103.0* 103.3* 107.4*  PLT 122* 147* 137*   Lab Results  Component Value Date   TSH 1.231 09/13/2018    Assessment/Plan  1. Anxiety - took PRN Ativan 4x in the last week, will continue Ativan 0.5 mg 1 tab Q 12 hours PRN and monitor behavior    Family/ staff Communication: Discussed  plan of care with resident.  Labs/tests ordered:  None  Goals of care:   Long-term care.   Kenard Gower, NP [facility] [phone] (Monday-Friday 8:00 a.m. - 5:00 p.m.) [phone] (after hours)

## 2019-03-05 ENCOUNTER — Other Ambulatory Visit: Payer: Self-pay

## 2019-03-05 NOTE — Telephone Encounter (Signed)
Opened in error; Disregard.

## 2019-03-06 ENCOUNTER — Other Ambulatory Visit: Payer: Self-pay

## 2019-03-06 MED ORDER — FLUTICASONE PROPIONATE 50 MCG/ACT NA SUSP: 1.0000 | Freq: Every day | NASAL | 6 refills | Status: AC

## 2019-03-06 NOTE — Telephone Encounter (Signed)
Received request for fluticasone refill.  Completed and sent to pharmacy of choice, [facility].

## 2019-03-13 ENCOUNTER — Encounter: Payer: Self-pay | Admitting: Adult Health

## 2019-03-13 ENCOUNTER — Non-Acute Institutional Stay (SKILLED_NURSING_FACILITY): Payer: Medicare Other | Admitting: Adult Health

## 2019-03-13 ENCOUNTER — Encounter
Admission: RE | Admit: 2019-03-13 | Discharge: 2019-03-13 | Disposition: A | Discharge status: Home / Self Care | Payer: Medicare Other | Source: Ambulatory Visit | Attending: Internal Medicine | Admitting: Internal Medicine

## 2019-03-13 DIAGNOSIS — F419 Anxiety disorder, unspecified: Secondary | ICD-10-CM

## 2019-03-13 DIAGNOSIS — F339 Major depressive disorder, recurrent, unspecified: Secondary | ICD-10-CM

## 2019-03-13 DIAGNOSIS — G8929 Other chronic pain: Secondary | ICD-10-CM

## 2019-03-13 NOTE — Progress Notes (Signed)
[facility]  [facility] Room Number: 336P Place of Service:  SNF ([phone]) Provider:  Kenard Gower, NP  Patient Care Team: Lauro Regulus, MD as PCP - General (Internal Medicine) Sharee Holster, NP as Nurse Practitioner (Geriatric Medicine)  Extended Emergency Contact Information Primary Emergency Contact: Marva Panda [place] Home Phone: [phone] Mobile Phone: [phone] Relation: Spouse Secondary Emergency Contact: Gwynneth Munson [place] Mobile Phone: [phone] Relation: Son  Code Status:  DNR  Goals of care: Advanced Directive information Advanced Directives 03/04/2019  Does Patient Have a Medical Advance Directive? Yes  Type of Advance Directive Out of facility DNR (pink MOST or yellow form)  Does patient want to make changes to medical advance directive? No - Patient declined  Copy of Healthcare Power of Attorney in Chart? -  Would patient like information on creating a medical advance directive? -  Pre-existing out of facility DNR order (yellow form or pink MOST form) -     Chief Complaint  Patient presents with  . Acute Visit    Anxiety    HPI:  Pt is a 85 y.o. male seen today for medical management of chronic diseases. He has PMH of Parkinson's disease, spinal stenosis, neuropathy, hypertension and DVT. He was reported by charge nurse to be anxious - calling out for help several times a day and not using his call light in front of him. He was given Ativan PRN 4 hours ago. His current order for Ativan is 0.5 mg Q 12 hours prn. Currently [facility] is closed and no visitors are allowed in the building   Past Medical History:  Diagnosis Date  . Anxiety   . Arthritis   . Blood transfusion    hx of autologus transfusion  . Cataract cortical, senile   . Chicken pox   . Clotting disorder (HCC)   . Complete heart block (HCC)   . Depression    unspecified  . DVT (deep venous  thrombosis) (HCC) 09/2014   left leg  . GERD (gastroesophageal reflux disease)   . Hereditary and idiopathic neuropathy   . History of depression   . Hypertension   . MCI (mild cognitive impairment) 10/22/2013  . Memory deficit 03/24/2011  . Memory loss   . Movement disorder   . Neuromuscular disorder (HCC)    peripheral  neuropathy  . Neuropathy   . Osteoarthritis   . Other hammer toe(s) (acquired), unspecified foot   . Pacemaker   . Parkinson's disease (HCC)   . Parkinsonism (HCC) 10/22/2013  . Shortness of breath   . Spinal stenosis   . Synovitis and tenosynovitis, unspecified   . Third degree heart block (HCC)   . Tremor    Past Surgical History:  Procedure Laterality Date  . CATARACT EXTRACTION  06/2005  . COLONOSCOPY  06/13/2006   PH colon polyps (MUS) repeat 7/20  . CORRECTION HAMMER TOE    . defibrilator  04/04/2012   Dual Chamber  . INSERT / REPLACE / REMOVE PACEMAKER  04/04/2012  . JOINT REPLACEMENT Right    THA  . JOINT REPLACEMENT Bilateral    TKA  . KNEE ARTHROSCOPY Bilateral    Right x 2 and Left  . KNEE SURGERY  [phone]  . LEAD REVISION N/A 04/05/2012   Procedure: LEAD REVISION;  Surgeon: Duke Salvia, MD;  Location: [facility] CATH LAB;  Service: Cardiovascular;  Laterality: N/A;  . PERMANENT PACEMAKER INSERTION N/A 04/04/2012   Procedure: PERMANENT  PACEMAKER INSERTION;  Surgeon: Marinus Maw, MD;  Location: [facility] CATH LAB;  Service: Cardiovascular;  Laterality: N/A;  . REPLACEMENT TOTAL HIP W/  RESURFACING IMPLANTS    . REPLACEMENT TOTAL KNEE BILATERAL  1997  . ROTATOR CUFF REPAIR  06/2008  . ROTATOR CUFF REPAIR Left 2010  . SHOULDER ARTHROSCOPY Left 06/19/2008   WITH SUBACROMIAL DECOMPRESSION. DISTAL CLAVICLE EXCISION. ARTHROSCOPIC ROTATOR CUFF REPAIR.  Marland Kitchen TOTAL HIP ARTHROPLASTY  2009   right  . UMBILICAL HERNIA REPAIR  2001  . VASECTOMY      Allergies  Allergen Reactions  . Citalopram     Other reaction(s): Unknown  . Codeine Nausea And Vomiting   . Sertraline     Other reaction(s): Unknown Other reaction(s): Unknown  . Lodine [Etodolac] Rash  . Percocet [Oxycodone-Acetaminophen] Rash    Tolerates tylenol Patient is able to take this recently    Outpatient Encounter Medications as of 03/13/2019  Medication Sig  . acetaminophen (TYLENOL) 325 MG tablet Take 650 mg by mouth 4 (four) times daily as needed for fever. Max of 3000 mg of Apap in 24 hrs  . alum & mag hydroxide-simeth (MAALOX PLUS) 400-400-40 MG/5ML suspension Take 30 mLs by mouth every 4 (four) hours as needed for indigestion.  . bisacodyl (BISCOLAX) 10 MG suppository Place 10 mg rectally 2 (two) times daily as needed for moderate constipation. Give until BM.  . carbidopa-levodopa (SINEMET IR) 25-250 MG tablet Take 1 tablet by mouth 2 (two) times daily.   . cetirizine (ZYRTEC) 10 MG tablet Take 10 mg by mouth at bedtime.  Marland Kitchen desvenlafaxine (PRISTIQ) 50 MG 24 hr tablet Take 1 tablet (50 mg total) by mouth daily.  . fluticasone (FLONASE) 50 MCG/ACT nasal spray Place 1 spray into both nostrils daily.  Marland Kitchen HYDROcodone-acetaminophen (NORCO/VICODIN) 5-325 MG tablet Take 1 tablet by mouth every 12 (twelve) hours for 30 days.  Marland Kitchen ipratropium (ATROVENT) 0.03 % nasal spray Place 2 sprays into both nostrils every 12 (twelve) hours.  Marland Kitchen ipratropium-albuterol (DUONEB) 0.5-2.5 (3) MG/3ML SOLN Take 3 mLs by nebulization every 6 (six) hours as needed (wheezing and shortness of breath with cough).  Marland Kitchen ketoconazole (NIZORAL) 2 % cream Apply 1 application topically 2 (two) times daily.  Marland Kitchen lisinopril (PRINIVIL,ZESTRIL) 40 MG tablet TAKE ONE TABLET BY MOUTH EVERY DAY  . LORazepam (ATIVAN) 0.5 MG tablet Take 1 tablet (0.5 mg total) by mouth every 12 (twelve) hours as needed for anxiety.  . magnesium hydroxide (MILK OF MAGNESIA) 400 MG/5ML suspension Take 30 mLs by mouth every 4 (four) hours as needed. If constipation/ no BM for 2 days  . Melatonin 3 MG TABS Take 1 tablet by mouth at bedtime. Should take 1  - 2 hours before sleep  . NON FORMULARY Diet Type: Regular diet  . ondansetron (ZOFRAN) 4 MG tablet Take 4 mg by mouth every 6 (six) hours as needed for nausea or vomiting.  . OXYGEN Inhale 2 L into the lungs as needed. Check O2 sat prior to placing on patient and at least every 4 hours after applying. Notify MD if O2 sat drop below baseline while receiving oxygen or no improvement in dyspnea  . pantoprazole (PROTONIX) 40 MG tablet Take 40 mg by mouth daily.  . Rivaroxaban (XARELTO) 15 MG TABS tablet Take 15 mg by mouth daily with supper. for anticoagulation. Monitor for bleeding/bruising.  . sennosides-docusate sodium (SENOKOT-S) 8.6-50 MG tablet Take 2 tablets by mouth 2 (two) times daily.   . Skin Protectants, Misc. (ENDIT  EX) Apply 1 application topically as needed (Skin irritation).  . sodium phosphate (FLEET) enema Place 1 enema rectally every three (3) days as needed. If constipation not relieved by milk of magnesia or bisacodyl suppository. Follow package directions  . sorbitol 70 % solution Take 30 mLs by mouth every 2 (two) hours as needed. Constipation. Give PO Q 2 hours until pt has large BM. Give first dose now along with Bisacodyl suppository  . tamsulosin (FLOMAX) 0.4 MG CAPS capsule Take 0.4 mg by mouth daily after supper. 30 mins after supper   No facility-administered encounter medications on file as of 03/13/2019.     Review of Systems  GENERAL: No change in appetite, no fatigue, no weight changes, no fever, chills or weakness MOUTH and THROAT: Denies oral discomfort, gingival pain or bleeding, pain from teeth or hoarseness   RESPIRATORY: no cough, SOB, DOE, wheezing, hemoptysis CARDIAC: No chest pain, or palpitations GI: No abdominal pain, diarrhea, constipation, heart burn, nausea or vomiting NEUROLOGICAL: +anxiety  Immunization History  Administered Date(s) Administered  . Influenza-Unspecified 10/20/2014, 09/10/2016, 09/02/2017, 10/12/2018  . PPD Test 12/05/2015,  11/26/2016, 11/11/2018  . Pneumococcal Polysaccharide-23 03/13/2014  . Pneumococcal-Unspecified 10/20/2014   Pertinent  Health Maintenance Due  Topic Date Due  . PNA vac Low Risk Adult (2 of 2 - PCV13) 03/16/2019 (Originally 10/21/2015)  . INFLUENZA VACCINE  07/13/2019   Fall Risk  10/30/2018  Falls in the past year? 0     Vitals:   03/13/19 1435  BP: (!) 144/73  Pulse: 67  Resp: 18  Temp: 97.8 F (36.6 C)  SpO2: 97%  Weight: 182 lb (82.6 kg)  Height: 5\' 10"  (1.778 m)   Body mass index is 26.11 kg/m.  Physical Exam  GENERAL APPEARANCE: Well nourished. In no acute distress. Normal body habitus SKIN:  Skin is warm and dry.  MOUTH and THROAT: Lips are without lesions. Oral mucosa is moist and without lesions.  RESPIRATORY: Breathing is even & unlabored, BS CTAB CARDIAC: RRR, no murmur,no extra heart sounds, BLE trace edema, left chest pacemaker GI: Abdomen soft, normal BS, no masses, no tenderness EXTREMITIES:  Able to move X 4 extremities NEUROLOGICAL: There is no tremor. Speech is clear. Alert and oriented X 3. PSYCHIATRIC:  Affect and behavior are appropriate  Labs reviewed: Recent Labs    09/13/18 1425 12/27/18 0620  NA 141 139  K 4.4 4.8  CL 106 107  CO2 28 27  GLUCOSE 116* 101*  BUN 20 20  CREATININE 1.12 0.91  CALCIUM 9.0 8.8*   Recent Labs    09/13/18 1425 12/27/18 0620  AST 14* 14*  ALT 8 8  ALKPHOS 59 64  BILITOT 0.9 0.9  PROT 6.4* 6.2*  ALBUMIN 4.2 3.9   Recent Labs    08/24/18 1503 09/13/18 1425 12/27/18 0620  WBC 5.7 5.4 5.9  NEUTROABS 2.6 3.1 2.6  HGB 13.4 12.6* 12.8*  HCT 40.0 37.9* 40.4  MCV 103.0* 103.3* 107.4*  PLT 122* 147* 137*   Lab Results  Component Value Date   TSH 1.231 09/13/2018    Assessment/Plan  1. Anxiety - will start on Ativan 0.5 mg 1 tab Q D and continue Ativan 0.5 mg 1 tab Q 12 hours PRN  2. Other chronic pain Well-controlled, continue Norco 5-325 mg 1 tab Q 12 hours  3. Major depression,  recurrent, chronic (HCC) -stable, continue Pristiq ER 1 tab daily   Family/ staff Communication: Discussed plan of care with resident.  Labs/tests ordered:  None  Goals of care:  Long-term care   Kenard Gower, NP [facility] [phone] (Monday-Friday 8:00 a.m. - 5:00 p.m.) [phone] (after hours)

## 2019-03-18 ENCOUNTER — Ambulatory Visit: Payer: Medicare Other | Admitting: Oncology

## 2019-03-18 ENCOUNTER — Other Ambulatory Visit: Payer: Medicare Other

## 2019-03-18 ENCOUNTER — Ambulatory Visit: Payer: Medicare Other | Admitting: Hematology and Oncology

## 2019-03-25 ENCOUNTER — Encounter: Payer: Self-pay | Admitting: Adult Health

## 2019-03-25 ENCOUNTER — Non-Acute Institutional Stay (SKILLED_NURSING_FACILITY): Payer: Medicare Other | Admitting: Adult Health

## 2019-03-25 DIAGNOSIS — F339 Major depressive disorder, recurrent, unspecified: Secondary | ICD-10-CM

## 2019-03-25 DIAGNOSIS — I825Y2 Chronic embolism and thrombosis of unspecified deep veins of left proximal lower extremity: Secondary | ICD-10-CM

## 2019-03-25 DIAGNOSIS — I10 Essential (primary) hypertension: Secondary | ICD-10-CM | POA: Diagnosis not present

## 2019-03-25 DIAGNOSIS — J309 Allergic rhinitis, unspecified: Secondary | ICD-10-CM | POA: Diagnosis not present

## 2019-03-25 DIAGNOSIS — G20 Parkinson's disease: Secondary | ICD-10-CM | POA: Diagnosis not present

## 2019-03-25 DIAGNOSIS — F419 Anxiety disorder, unspecified: Secondary | ICD-10-CM

## 2019-03-25 DIAGNOSIS — G8929 Other chronic pain: Secondary | ICD-10-CM
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
## 2019-03-26 ENCOUNTER — Other Ambulatory Visit
Admission: RE | Admit: 2019-03-26 | Discharge: 2019-03-26 | Disposition: A | Discharge status: Home / Self Care | Payer: Medicare Other | Source: Ambulatory Visit | Attending: Internal Medicine | Admitting: Internal Medicine

## 2019-03-26 DIAGNOSIS — R410 Disorientation, unspecified: Secondary | ICD-10-CM | POA: Diagnosis present

## 2019-03-26 LAB — URINALYSIS, ROUTINE W REFLEX MICROSCOPIC
Bilirubin Urine: NEGATIVE
Glucose, UA: NEGATIVE mg/dL
Hgb urine dipstick: NEGATIVE
Ketones, ur: 5 mg/dL — AB
Leukocytes,Ua: NEGATIVE
Nitrite: NEGATIVE
Protein, ur: NEGATIVE mg/dL
Specific Gravity, Urine: 1.019 (ref 1.005–1.030)
pH: 7 (ref 5.0–8.0)

## 2019-03-27 LAB — URINE CULTURE

## 2019-03-28 ENCOUNTER — Other Ambulatory Visit: Payer: Self-pay | Admitting: Adult Health

## 2019-03-28 MED ORDER — HYDROCODONE-ACETAMINOPHEN 5-325 MG PO TABS: 1.0000 | ORAL_TABLET | Freq: Two times a day (BID) | ORAL | 0 refills | Status: DC

## 2019-04-04 ENCOUNTER — Other Ambulatory Visit: Payer: Self-pay | Admitting: Adult Health

## 2019-04-04 MED ORDER — DESVENLAFAXINE SUCCINATE ER 50 MG PO TB24: 50.0000 mg | ORAL_TABLET | Freq: Every day | ORAL | 2 refills | Status: AC

## 2019-04-12 ENCOUNTER — Encounter
Admission: RE | Admit: 2019-04-12 | Discharge: 2019-04-12 | Disposition: A | Discharge status: Home / Self Care | Payer: Medicare Other | Source: Ambulatory Visit | Attending: Internal Medicine | Admitting: Internal Medicine

## 2019-04-15 ENCOUNTER — Other Ambulatory Visit: Payer: Self-pay | Admitting: Adult Health

## 2019-04-15 MED ORDER — CARBIDOPA-LEVODOPA 25-250 MG PO TABS: 1.0000 | ORAL_TABLET | Freq: Two times a day (BID) | ORAL | 0 refills | Status: AC

## 2019-04-22 ENCOUNTER — Emergency Department: Payer: Medicare Other

## 2019-04-22 ENCOUNTER — Encounter: Payer: Self-pay | Admitting: Adult Health

## 2019-04-22 ENCOUNTER — Encounter: Payer: Self-pay | Admitting: Emergency Medicine

## 2019-04-22 ENCOUNTER — Inpatient Hospital Stay
Admission: EM | Admit: 2019-04-22 | Discharge: 2019-04-25 | DRG: 689 | Disposition: A | Discharge status: Skilled Nursing Facility | Payer: Medicare Other | Source: Skilled Nursing Facility | Attending: Internal Medicine | Admitting: Internal Medicine

## 2019-04-22 ENCOUNTER — Other Ambulatory Visit: Payer: Self-pay

## 2019-04-22 DIAGNOSIS — D539 Nutritional anemia, unspecified: Secondary | ICD-10-CM | POA: Diagnosis present

## 2019-04-22 DIAGNOSIS — F329 Major depressive disorder, single episode, unspecified: Secondary | ICD-10-CM | POA: Diagnosis present

## 2019-04-22 DIAGNOSIS — N390 Urinary tract infection, site not specified: Principal | ICD-10-CM | POA: Diagnosis present

## 2019-04-22 DIAGNOSIS — Z7901 Long term (current) use of anticoagulants: Secondary | ICD-10-CM | POA: Diagnosis not present

## 2019-04-22 DIAGNOSIS — R410 Disorientation, unspecified: Secondary | ICD-10-CM | POA: Diagnosis present

## 2019-04-22 DIAGNOSIS — Z20828 Contact with and (suspected) exposure to other viral communicable diseases: Secondary | ICD-10-CM | POA: Diagnosis present

## 2019-04-22 DIAGNOSIS — Z66 Do not resuscitate: Secondary | ICD-10-CM | POA: Diagnosis present

## 2019-04-22 DIAGNOSIS — D696 Thrombocytopenia, unspecified: Secondary | ICD-10-CM | POA: Diagnosis present

## 2019-04-22 DIAGNOSIS — B9620 Unspecified Escherichia coli [E. coli] as the cause of diseases classified elsewhere: Secondary | ICD-10-CM | POA: Diagnosis not present

## 2019-04-22 DIAGNOSIS — G9341 Metabolic encephalopathy: Secondary | ICD-10-CM | POA: Diagnosis present

## 2019-04-22 DIAGNOSIS — R17 Unspecified jaundice: Secondary | ICD-10-CM | POA: Diagnosis present

## 2019-04-22 DIAGNOSIS — R32 Unspecified urinary incontinence: Secondary | ICD-10-CM | POA: Diagnosis present

## 2019-04-22 DIAGNOSIS — J9811 Atelectasis: Secondary | ICD-10-CM | POA: Diagnosis present

## 2019-04-22 DIAGNOSIS — G20 Parkinson's disease: Secondary | ICD-10-CM | POA: Diagnosis present

## 2019-04-22 DIAGNOSIS — R7881 Bacteremia: Secondary | ICD-10-CM | POA: Diagnosis present

## 2019-04-22 DIAGNOSIS — Z8249 Family history of ischemic heart disease and other diseases of the circulatory system: Secondary | ICD-10-CM | POA: Diagnosis not present

## 2019-04-22 DIAGNOSIS — I10 Essential (primary) hypertension: Secondary | ICD-10-CM | POA: Diagnosis present

## 2019-04-22 DIAGNOSIS — K219 Gastro-esophageal reflux disease without esophagitis: Secondary | ICD-10-CM | POA: Diagnosis present

## 2019-04-22 DIAGNOSIS — Z87891 Personal history of nicotine dependence: Secondary | ICD-10-CM | POA: Diagnosis not present

## 2019-04-22 DIAGNOSIS — Z885 Allergy status to narcotic agent status: Secondary | ICD-10-CM | POA: Diagnosis not present

## 2019-04-22 DIAGNOSIS — Z96641 Presence of right artificial hip joint: Secondary | ICD-10-CM | POA: Diagnosis present

## 2019-04-22 DIAGNOSIS — Z950 Presence of cardiac pacemaker: Secondary | ICD-10-CM | POA: Diagnosis not present

## 2019-04-22 DIAGNOSIS — N3944 Nocturnal enuresis: Secondary | ICD-10-CM | POA: Diagnosis not present

## 2019-04-22 DIAGNOSIS — Z86718 Personal history of other venous thrombosis and embolism: Secondary | ICD-10-CM

## 2019-04-22 DIAGNOSIS — L8990 Pressure ulcer of unspecified site, unspecified stage: Secondary | ICD-10-CM

## 2019-04-22 DIAGNOSIS — Z96653 Presence of artificial knee joint, bilateral: Secondary | ICD-10-CM | POA: Diagnosis not present

## 2019-04-22 LAB — COMPREHENSIVE METABOLIC PANEL
ALT: 9 U/L (ref 0–44)
AST: 19 U/L (ref 15–41)
Albumin: 4 g/dL (ref 3.5–5.0)
Alkaline Phosphatase: 61 U/L (ref 38–126)
Anion gap: 9 (ref 5–15)
BUN: 23 mg/dL (ref 8–23)
CO2: 26 mmol/L (ref 22–32)
Calcium: 8.8 mg/dL — ABNORMAL LOW (ref 8.9–10.3)
Chloride: 105 mmol/L (ref 98–111)
Creatinine, Ser: 0.96 mg/dL (ref 0.61–1.24)
GFR calc Af Amer: >60 mL/min (ref >60)
GFR calc non Af Amer: >60 mL/min (ref >60)
Glucose, Bld: 116 mg/dL — ABNORMAL HIGH (ref 70–99)
Potassium: 4.1 mmol/L (ref 3.5–5.1)
Sodium: 140 mmol/L (ref 135–145)
Total Bilirubin: 2 mg/dL — ABNORMAL HIGH (ref 0.3–1.2)
Total Protein: 6.3 g/dL — ABNORMAL LOW (ref 6.5–8.1)

## 2019-04-22 LAB — URINALYSIS, COMPLETE (UACMP) WITH MICROSCOPIC
Bilirubin Urine: NEGATIVE
Glucose, UA: NEGATIVE mg/dL
Ketones, ur: 5 mg/dL — AB
Nitrite: POSITIVE — AB
Protein, ur: 100 mg/dL — AB
RBC / HPF: >50 RBC/hpf — ABNORMAL HIGH (ref 0–5)
Specific Gravity, Urine: 1.014 (ref 1.005–1.030)
WBC, UA: >50 WBC/hpf — ABNORMAL HIGH (ref 0–5)
pH: 6 (ref 5.0–8.0)

## 2019-04-22 LAB — CBC WITH DIFFERENTIAL/PLATELET
Abs Immature Granulocytes: 0.07 10*3/uL (ref 0.00–0.07)
Basophils Absolute: 0 10*3/uL (ref 0.0–0.1)
Basophils Relative: 0 %
Eosinophils Absolute: 0.2 10*3/uL (ref 0.0–0.5)
Eosinophils Relative: 1 %
HCT: 38.6 % — ABNORMAL LOW (ref 39.0–52.0)
Hemoglobin: 12.7 g/dL — ABNORMAL LOW (ref 13.0–17.0)
Immature Granulocytes: 1 %
Lymphocytes Relative: 14 %
Lymphs Abs: 1.6 10*3/uL (ref 0.7–4.0)
MCH: 34.6 pg — ABNORMAL HIGH (ref 26.0–34.0)
MCHC: 32.9 g/dL (ref 30.0–36.0)
MCV: 105.2 fL — ABNORMAL HIGH (ref 80.0–100.0)
Monocytes Absolute: 0.7 10*3/uL (ref 0.1–1.0)
Monocytes Relative: 6 %
Neutro Abs: 9.3 10*3/uL — ABNORMAL HIGH (ref 1.7–7.7)
Neutrophils Relative %: 78 %
Platelets: 121 10*3/uL — ABNORMAL LOW (ref 150–400)
RBC: 3.67 MIL/uL — ABNORMAL LOW (ref 4.22–5.81)
RDW: 13 % (ref 11.5–15.5)
WBC: 11.8 10*3/uL — ABNORMAL HIGH (ref 4.0–10.5)
nRBC: 0 % (ref 0.0–0.2)

## 2019-04-22 LAB — GLUCOSE, CAPILLARY: Glucose-Capillary: 102 mg/dL — ABNORMAL HIGH (ref 70–99)

## 2019-04-22 LAB — LIPASE, BLOOD: Lipase: 21 U/L (ref 11–51)

## 2019-04-22 LAB — PROTIME-INR
INR: 2.3 — ABNORMAL HIGH (ref 0.8–1.2)
Prothrombin Time: 25 seconds — ABNORMAL HIGH (ref 11.4–15.2)

## 2019-04-22 LAB — SARS CORONAVIRUS 2 BY RT PCR (HOSPITAL ORDER, PERFORMED IN ~~LOC~~ HOSPITAL LAB): SARS Coronavirus 2: NEGATIVE

## 2019-04-22 LAB — APTT: aPTT: 46 seconds — ABNORMAL HIGH (ref 24–36)

## 2019-04-22 LAB — MRSA PCR SCREENING: MRSA by PCR: NEGATIVE

## 2019-04-22 MED ORDER — CARBIDOPA-LEVODOPA 25-250 MG PO TABS
1.0000 | ORAL_TABLET | Freq: Two times a day (BID) | ORAL | Status: DC
  Administered 2019-04-22 – 2019-04-25 (×6): 1 via ORAL
  Filled 2019-04-22 (×7): qty 1

## 2019-04-22 MED ORDER — SODIUM CHLORIDE 0.9 % IV BOLUS
500.0000 mL | Freq: Once | INTRAVENOUS | Status: AC
  Administered 2019-04-22: 13:00:00 500 mL via INTRAVENOUS

## 2019-04-22 MED ORDER — CARBIDOPA-LEVODOPA 25-250 MG PO TABS
1.0000 | ORAL_TABLET | Freq: Two times a day (BID) | ORAL | Status: DC
  Filled 2019-04-22 (×2): qty 1

## 2019-04-22 MED ORDER — PANTOPRAZOLE SODIUM 40 MG PO TBEC
40.0000 mg | DELAYED_RELEASE_TABLET | Freq: Every day | ORAL | Status: DC
  Administered 2019-04-23 – 2019-04-25 (×3): 40 mg via ORAL
  Filled 2019-04-22 (×3): qty 1

## 2019-04-22 MED ORDER — IPRATROPIUM-ALBUTEROL 0.5-2.5 (3) MG/3ML IN SOLN: 3.0000 mL | Freq: Four times a day (QID) | RESPIRATORY_TRACT | Status: DC | PRN

## 2019-04-22 MED ORDER — SODIUM CHLORIDE 0.9 % IV SOLN
1.0000 g | Freq: Once | INTRAVENOUS | Status: AC
  Administered 2019-04-22: 13:00:00 1 g via INTRAVENOUS
  Filled 2019-04-22: qty 10

## 2019-04-22 MED ORDER — IPRATROPIUM BROMIDE 0.03 % NA SOLN
2.0000 | Freq: Two times a day (BID) | NASAL | Status: DC
  Administered 2019-04-22 – 2019-04-25 (×5): 2 via NASAL
  Filled 2019-04-22: qty 30

## 2019-04-22 MED ORDER — LISINOPRIL 20 MG PO TABS
40.0000 mg | ORAL_TABLET | Freq: Every day | ORAL | Status: DC
  Administered 2019-04-24 – 2019-04-25 (×2): 40 mg via ORAL
  Filled 2019-04-22 (×2): qty 2

## 2019-04-22 MED ORDER — ACETAMINOPHEN 325 MG PO TABS
650.0000 mg | ORAL_TABLET | Freq: Four times a day (QID) | ORAL | Status: DC | PRN
  Administered 2019-04-22: 19:00:00 650 mg via ORAL
  Filled 2019-04-22: qty 2

## 2019-04-22 MED ORDER — HEPARIN SODIUM (PORCINE) 5000 UNIT/ML IJ SOLN: 5000.0000 [IU] | Freq: Three times a day (TID) | INTRAMUSCULAR | Status: DC

## 2019-04-22 MED ORDER — DOCUSATE SODIUM 100 MG PO CAPS
100.0000 mg | ORAL_CAPSULE | Freq: Two times a day (BID) | ORAL | Status: DC | PRN
  Administered 2019-04-24: 22:00:00 100 mg via ORAL
  Filled 2019-04-22: qty 1

## 2019-04-22 MED ORDER — SODIUM CHLORIDE 0.9 % IV SOLN
1.0000 g | INTRAVENOUS | Status: DC
  Filled 2019-04-22: qty 10

## 2019-04-22 MED ORDER — TAMSULOSIN HCL 0.4 MG PO CAPS
0.4000 mg | ORAL_CAPSULE | Freq: Every day | ORAL | Status: DC
  Administered 2019-04-22 – 2019-04-24 (×3): 0.4 mg via ORAL
  Filled 2019-04-22 (×3): qty 1

## 2019-04-22 MED ORDER — RIVAROXABAN 15 MG PO TABS
15.0000 mg | ORAL_TABLET | Freq: Every day | ORAL | Status: DC
  Administered 2019-04-22 – 2019-04-24 (×3): 15 mg via ORAL
  Filled 2019-04-22 (×5): qty 1

## 2019-04-22 MED ORDER — ONDANSETRON HCL 4 MG PO TABS: 4.0000 mg | ORAL_TABLET | Freq: Four times a day (QID) | ORAL | Status: DC | PRN

## 2019-04-22 MED ORDER — SENNOSIDES-DOCUSATE SODIUM 8.6-50 MG PO TABS
2.0000 | ORAL_TABLET | Freq: Two times a day (BID) | ORAL | Status: DC
  Administered 2019-04-22 – 2019-04-25 (×6): 2 via ORAL
  Filled 2019-04-22 (×6): qty 2

## 2019-04-22 MED ORDER — FLUTICASONE PROPIONATE 50 MCG/ACT NA SUSP
1.0000 | Freq: Every day | NASAL | Status: DC
  Administered 2019-04-23 – 2019-04-25 (×3): 1 via NASAL
  Filled 2019-04-22: qty 16

## 2019-04-22 MED ORDER — MELATONIN 5 MG PO TABS
5.0000 mg | ORAL_TABLET | Freq: Every day | ORAL | Status: DC
  Administered 2019-04-22 – 2019-04-24 (×3): 5 mg via ORAL
  Filled 2019-04-22 (×4): qty 1

## 2019-04-22 NOTE — H&P (Addendum)
[facility] [place] at [facility]   PATIENT NAME: Randy [name]    MR#:  [id number]  DATE OF BIRTH:  07-19-1934  DATE OF ADMISSION:  04/22/2019  PRIMARY CARE PHYSICIAN: Lauro RegulusAnderson, Marshall W, MD   REQUESTING/REFERRING PHYSICIAN: Quale  CHIEF COMPLAINT:   Chief Complaint  Patient presents with  . Altered Mental Status    HISTORY OF PRESENT ILLNESS: Randy QuillWilliam Mckibbin  is a 85 y.o. male with a known history of anxiety, clotting disorder, heart block, DVT, peripheral neuropathy, osteoarthritis, status post pacemaker-sent from the nursing home with complaint of increasing fatigue.  Patient denies any pain or fever.  He is not very good about complaining regarding any symptoms.  He was noted to have UTI, COVID test was negative.  PAST MEDICAL HISTORY:   Past Medical History:  Diagnosis Date  . Anxiety   . Arthritis   . Blood transfusion    hx of autologus transfusion  . Cataract cortical, senile   . Chicken pox   . Clotting disorder (HCC)   . Complete heart block (HCC)   . Depression    unspecified  . DVT (deep venous thrombosis) (HCC) 09/2014   left leg  . GERD (gastroesophageal reflux disease)   . Hereditary and idiopathic neuropathy   . History of depression   . Hypertension   . MCI (mild cognitive impairment) 10/22/2013  . Memory deficit 03/24/2011  . Memory loss   . Movement disorder   . Neuromuscular disorder (HCC)    peripheral  neuropathy  . Neuropathy   . Osteoarthritis   . Other hammer toe(s) (acquired), unspecified foot   . Pacemaker   . Parkinson's disease (HCC)   . Parkinsonism (HCC) 10/22/2013  . Shortness of breath   . Spinal stenosis   . Synovitis and tenosynovitis, unspecified   . Third degree heart block (HCC)   . Tremor     PAST SURGICAL HISTORY:  Past Surgical History:  Procedure Laterality Date  . CATARACT EXTRACTION  06/2005  . COLONOSCOPY  06/13/2006   PH colon polyps (MUS) repeat 7/20  . CORRECTION HAMMER TOE    .  defibrilator  04/04/2012   Dual Chamber  . INSERT / REPLACE / REMOVE PACEMAKER  04/04/2012  . JOINT REPLACEMENT Right    THA  . JOINT REPLACEMENT Bilateral    TKA  . KNEE ARTHROSCOPY Bilateral    Right x 2 and Left  . KNEE SURGERY  [phone],[id number]  . LEAD REVISION N/A 04/05/2012   Procedure: LEAD REVISION;  Surgeon: Duke SalviaSteven C Klein, MD;  [facility];  Service: Cardiovascular;  Laterality: N/A;  . PERMANENT PACEMAKER INSERTION N/A 04/04/2012   Procedure: PERMANENT PACEMAKER INSERTION;  Surgeon: Marinus MawGregg W Taylor, MD;  [facility];  Service: Cardiovascular;  Laterality: N/A;  . REPLACEMENT TOTAL HIP W/  RESURFACING IMPLANTS    . REPLACEMENT TOTAL KNEE BILATERAL  1997  . ROTATOR CUFF REPAIR  06/2008  . ROTATOR CUFF REPAIR Left 2010  . SHOULDER ARTHROSCOPY Left 06/19/2008   WITH SUBACROMIAL DECOMPRESSION. DISTAL CLAVICLE EXCISION. ARTHROSCOPIC ROTATOR CUFF REPAIR.  Marland Kitchen. TOTAL HIP ARTHROPLASTY  2009   right  . UMBILICAL HERNIA REPAIR  2001  . VASECTOMY      SOCIAL HISTORY:  Social History   Tobacco Use  . Smoking status: Former Smoker    Packs/day: 1.00    Years: 25.00    Pack years: 25.00    Types: Cigarettes    Last attempt to quit: 06/24/1973  Years since quitting: 45.8  . Smokeless tobacco: Never Used  Substance Use Topics  . Alcohol use: Not Currently    Comment: occasional    FAMILY HISTORY:  Family History  Problem Relation Age of Onset  . Hypertension Mother   . Heart disease Mother   . Hypertension Father   . Seizures Father        epilepsy  . Heart disease Father   . Prostate cancer Other   . Cancer Other   . Hypertension Other   . Asthma Other   . Hypertension Brother   . Breast cancer Daughter     DRUG ALLERGIES:  Allergies  Allergen Reactions  . Citalopram     Other reaction(s): Unknown  . Codeine Nausea And Vomiting  . Sertraline     Other reaction(s): Unknown Other reaction(s): Unknown  . Lodine [Etodolac] Rash  . Percocet  [Oxycodone-Acetaminophen] Rash    Tolerates tylenol Patient is able to take this recently    REVIEW OF SYSTEMS:   CONSTITUTIONAL: No fever, have fatigue or weakness.  EYES: No blurred or double vision.  EARS, NOSE, AND THROAT: No tinnitus or ear pain.  RESPIRATORY: No cough, shortness of breath, wheezing or hemoptysis.  CARDIOVASCULAR: No chest pain, orthopnea, edema.  GASTROINTESTINAL: No nausea, vomiting, diarrhea or abdominal pain.  GENITOURINARY: No dysuria, hematuria.  ENDOCRINE: No polyuria, nocturia,  HEMATOLOGY: No anemia, easy bruising or bleeding SKIN: No rash or lesion. MUSCULOSKELETAL: No joint pain or arthritis.   NEUROLOGIC: No tingling, numbness, weakness.  PSYCHIATRY: No anxiety or depression.   MEDICATIONS AT HOME:  Prior to Admission medications   Medication Sig Start Date End Date Taking? Authorizing Provider  acetaminophen (TYLENOL) 325 MG tablet Take 650 mg by mouth 4 (four) times daily as needed for fever. Max of 3000 mg of Apap in 24 hrs   Yes [provider]  alum & mag hydroxide-simeth (MAALOX PLUS) 400-400-40 MG/5ML suspension Take 30 mLs by mouth every 4 (four) hours as needed for indigestion. 11/04/18  Yes [provider]  bisacodyl (BISCOLAX) 10 MG suppository Place 10 mg rectally 2 (two) times daily as needed for moderate constipation. Give until BM.   Yes [provider]  carbidopa-levodopa (SINEMET IR) 25-250 MG tablet Take 1 tablet by mouth 2 (two) times daily for 30 days. 04/15/19 05/15/19 Yes Medina-Vargas, Monina C, NP  cetirizine (ZYRTEC) 10 MG tablet Take 10 mg by mouth at bedtime.   Yes [provider]  desvenlafaxine (PRISTIQ) 50 MG 24 hr tablet Take 1 tablet (50 mg total) by mouth daily. 04/04/19  Yes Medina-Vargas, Monina C, NP  fluticasone (FLONASE) 50 MCG/ACT nasal spray Place 1 spray into both nostrils daily. 03/06/19  Yes Medina-Vargas, Monina C, NP  HYDROcodone-acetaminophen (NORCO/VICODIN) 5-325 MG tablet  Take 1 tablet by mouth every 12 (twelve) hours as needed for moderate pain.   Yes [provider]  ipratropium (ATROVENT) 0.03 % nasal spray Place 2 sprays into both nostrils every 12 (twelve) hours.   Yes [provider]  ipratropium-albuterol (DUONEB) 0.5-2.5 (3) MG/3ML SOLN Take 3 mLs by nebulization every 6 (six) hours as needed (wheezing and shortness of breath with cough).   Yes [provider]  lisinopril (PRINIVIL,ZESTRIL) 40 MG tablet TAKE ONE TABLET BY MOUTH EVERY DAY 05/05/16  Yes Duke Salvia, MD  LORazepam (ATIVAN) 0.5 MG tablet Take 0.5 mg by mouth daily. Hold for sedation   Yes [provider]  magnesium hydroxide (MILK OF MAGNESIA) 400 MG/5ML suspension  Take 30 mLs by mouth every 4 (four) hours as needed. If constipation/ no BM for 2 days   Yes [provider]  Melatonin 3 MG TABS Take 1 tablet by mouth at bedtime. Should take 1 - 2 hours before sleep   Yes [provider]  ondansetron (ZOFRAN) 4 MG tablet Take 4 mg by mouth every 6 (six) hours as needed for nausea or vomiting.   Yes [provider]  pantoprazole (PROTONIX) 40 MG tablet Take 40 mg by mouth daily.   Yes [provider]  Rivaroxaban (XARELTO) 15 MG TABS tablet Take 15 mg by mouth daily with supper. for anticoagulation. Monitor for bleeding/bruising.   Yes [provider]  sennosides-docusate sodium (SENOKOT-S) 8.6-50 MG tablet Take 2 tablets by mouth 2 (two) times daily.    Yes [provider]  sodium phosphate (FLEET) enema Place 1 enema rectally every three (3) days as needed. If constipation not relieved by milk of magnesia or bisacodyl suppository. Follow package directions   Yes [provider]  sorbitol 70 % solution Take 30 mLs by mouth every 2 (two) hours as needed. Constipation. Give PO Q 2 hours until pt has large BM. Give first dose now along with Bisacodyl suppository   Yes [provider]  tamsulosin  (FLOMAX) 0.4 MG CAPS capsule Take 0.4 mg by mouth daily after supper. 30 mins after supper   Yes [provider]  zinc oxide 20 % ointment Apply 1 application topically as needed for irritation. Apply liberal amount to areas of skin irritation as needed   Yes [provider]      PHYSICAL EXAMINATION:   VITAL SIGNS: Blood pressure 138/78, pulse 96, temperature 98.6 F (37 C), temperature source Oral, resp. rate 17, height 5\' 10"  (1.778 m), weight 81.5 kg, SpO2 94 %.  GENERAL:  85 y.o.-year-old patient lying in the bed with no acute distress.  EYES: Pupils equal, round, reactive to light and accommodation. No scleral icterus. Extraocular muscles intact.  HEENT: Head atraumatic, normocephalic. Oropharynx and nasopharynx clear.  NECK:  Supple, no jugular venous distention. No thyroid enlargement, no tenderness.  LUNGS: Normal breath sounds bilaterally, no wheezing, rales,rhonchi or crepitation. No use of accessory muscles of respiration.  CARDIOVASCULAR: S1, S2 normal. No murmurs, rubs, or gallops.  ABDOMEN: Soft, nontender, nondistended. Bowel sounds present. No organomegaly or mass.  EXTREMITIES: No pedal edema, cyanosis, or clubbing.  NEUROLOGIC: Cranial nerves II through XII are intact. Muscle strength 3/5 in all extremities. Sensation intact. Gait not checked.  PSYCHIATRIC: The patient is alert and oriented x 3.  SKIN: No obvious rash, lesion, or ulcer.   LABORATORY PANEL:   CBC Recent Labs  Lab 04/22/19 0951  WBC 11.8*  HGB 12.7*  HCT 38.6*  PLT 121*  MCV 105.2*  MCH 34.6*  MCHC 32.9  RDW 13.0  LYMPHSABS 1.6  MONOABS 0.7  EOSABS 0.2  BASOSABS 0.0   ------------------------------------------------------------------------------------------------------------------  Chemistries  Recent Labs  Lab 04/22/19 0951  NA 140  K 4.1  CL 105  CO2 26  GLUCOSE 116*  BUN 23  CREATININE 0.96  CALCIUM 8.8*  AST 19  ALT 9  ALKPHOS 61  BILITOT 2.0*    ------------------------------------------------------------------------------------------------------------------ estimated creatinine clearance is 60.2 mL/min (by C-G formula based on SCr of 0.96 mg/dL). ------------------------------------------------------------------------------------------------------------------ No results for input(s): TSH, T4TOTAL, T3FREE, THYROIDAB in the last 72 hours.  Invalid input(s): FREET3   Coagulation profile Recent Labs  Lab 04/22/19 0951  INR 2.3*   -------------------------------------------------------------------------------------------------------------------  No results for input(s): DDIMER in the last 72 hours. -------------------------------------------------------------------------------------------------------------------  Cardiac Enzymes No results for input(s): CKMB, TROPONINI, MYOGLOBIN in the last 168 hours.  Invalid input(s): CK ------------------------------------------------------------------------------------------------------------------ Invalid input(s): POCBNP  ---------------------------------------------------------------------------------------------------------------  Urinalysis    Component Value Date/Time   COLORURINE AMBER (A) 04/22/2019 1115   APPEARANCEUR CLOUDY (A) 04/22/2019 1115   APPEARANCEUR Clear 10/17/2014 1411   LABSPEC 1.014 04/22/2019 1115   LABSPEC 1.008 10/17/2014 1411   PHURINE 6.0 04/22/2019 1115   GLUCOSEU NEGATIVE 04/22/2019 1115   GLUCOSEU Negative 10/17/2014 1411   HGBUR MODERATE (A) 04/22/2019 1115   BILIRUBINUR NEGATIVE 04/22/2019 1115   BILIRUBINUR Negative 10/17/2014 1411   KETONESUR 5 (A) 04/22/2019 1115   PROTEINUR 100 (A) 04/22/2019 1115   NITRITE POSITIVE (A) 04/22/2019 1115   LEUKOCYTESUR MODERATE (A) 04/22/2019 1115   LEUKOCYTESUR Negative 10/17/2014 1411     RADIOLOGY: Dg Chest 2 View  Result Date: 04/22/2019 CLINICAL DATA:  Right sided facial droop and left arm  weakness. EXAM: CHEST - 2 VIEW COMPARISON:  10/15/2014 FINDINGS: Chronic dual lead pacemaker. Chronic cardiomegaly. Chronic aortic atherosclerosis. Slight worsening of bilateral lower lobe atelectasis. Patchy basilar pneumonia not excluded. Upper lobes remain clear. IMPRESSION: Slight worsening of patchy density at both lung bases that could be simple atelectasis or mild bibasilar pneumonia. Electronically Signed   By: Paulina Fusi M.D.   On: 04/22/2019 10:40   Ct Head Wo Contrast  Result Date: 04/22/2019 CLINICAL DATA:  RIGHT-sided facial droop and LEFT arm weakness. History of Parkinson's disease. Patient is anticoagulated. EXAM: CT HEAD WITHOUT CONTRAST TECHNIQUE: Contiguous axial images were obtained from the base of the skull through the vertex without intravenous contrast. COMPARISON:  CT head 11/14/2016. FINDINGS: Brain: No evidence of acute infarction, hemorrhage, hydrocephalus, extra-axial collection or mass lesion/mass effect. Generalized atrophy. Hypoattenuation of white matter, likely small vessel disease. Vascular: Calcification of the cavernous internal carotid arteries consistent with cerebrovascular atherosclerotic disease. No signs of intracranial large vessel occlusion. Skull: Calvarium intact. Sinuses/Orbits: No acute finding. Other: Compared with priors, similar appearance. IMPRESSION: Atrophy and small vessel disease. No acute intracranial findings. Electronically Signed   By: Elsie Stain M.D.   On: 04/22/2019 10:28    EKG: Orders placed or performed during the hospital encounter of 04/22/19  . ED EKG  . ED EKG  . EKG 12-Lead  . EKG 12-Lead    IMPRESSION AND PLAN:  * UTI Functional decline  IV ceftriaxone and urine culture. Follow the cultures.  Blood cultures are sent. Physical therapy evaluation.  * History of DVT Continue Xarelto.  * Hypertension Continue lisinopril.  *Thrombocytopenia Continue to monitor.  *Parkinson's disease Continue carbidopa  levodopa.  *Atelectasis on lung Patient does not have symptoms of pneumonia, will give incentive spirometry and continue to monitor.  All the records are reviewed and case discussed with ED provider. Management plans discussed with the patient, family and they are in agreement.  CODE STATUS: DNR Advance Directive Documentation     Most Recent Value  Type of Advance Directive  Out of facility DNR (pink MOST or yellow form)  Pre-existing out of facility DNR order (yellow form or pink MOST form)  Yellow form placed in chart (order not valid for inpatient use)  "MOST" Form in Place?  -     Patient have out of facility DNR form sent with him from the facility.  TOTAL TIME TAKING CARE OF THIS PATIENT: 45 minutes.    Altamese Dilling M.D on 04/22/2019   Between  7am to 6pm - Pager - [phone]  After 6pm go to [email or web address] - Social research officer, government  [facility]  Office  [phone]  CC: Primary care physician; Lauro Regulus, MD   Note: This dictation was prepared with Dragon dictation along with smaller phrase technology. Any transcriptional errors that result from this process are unintentional.

## 2019-04-22 NOTE — Progress Notes (Signed)
Attempted to call [facility] x3 to complete admission profile with no answer. Bo Mcclintock, RN

## 2019-04-22 NOTE — Progress Notes (Signed)
This encounter was created in error - please disregard.

## 2019-04-22 NOTE — ED Notes (Signed)
ED TO INPATIENT HANDOFF REPORT  ED Nurse Name and Phone #:    S Name/Age/Gender [name] 85 y.o. male Room/Bed: ED26A/ED26A  Code Status   Code Status: DNR  Home/SNF/Other Rehab Patient oriented to: self, place and time Is this baseline? No   Triage Complete: Triage complete  Chief Complaint UTI   Triage Note Pt [name]. 911 call placed by staff for R-side facial droop and L arm weakness. EMS report their stroke screen was negative. No facial droop or focal weakness noted on arrival. EMS report foul odor of urine. On xarelto. Hx parkinson's, pacemaker. Pt is confused, able to answer some simple questions. Per SNF pt is more confused than usual. Denies pain at this time.    Allergies Allergies  Allergen Reactions  . Citalopram     Other reaction(s): Unknown  . Codeine Nausea And Vomiting  . Sertraline     Other reaction(s): Unknown Other reaction(s): Unknown  . Lodine [Etodolac] Rash  . Percocet [Oxycodone-Acetaminophen] Rash    Tolerates tylenol Patient is able to take this recently    Level of Care/Admitting Diagnosis ED Disposition    ED Disposition Condition Comment   Admit  [facility] Area[name] [[id number]]  Level of Care: Med-Surg [16]  Covid Evaluation: N/A  Diagnosis: UTI (urinary tract infection) [161096]  Admitting Physician: [name] [phone]  Attending Physician: [name] [phone]  Estimated length of stay: past midnight tomorrow  Certification:: I certify this patient will need inpatient services for at least 2 midnights  PT Class (Do Not Modify): Inpatient [101]  PT Acc Code (Do Not Modify): Private [1]       B Medical/Surgery History Past Medical History:  Diagnosis Date  . Anxiety   . Arthritis   . Blood transfusion    hx of autologus transfusion  . Cataract cortical, senile   . Chicken pox   . Clotting disorder (HCC)   .  Complete heart block (HCC)   . Depression    unspecified  . DVT (deep venous thrombosis) (HCC) 09/2014   left leg  . GERD (gastroesophageal reflux disease)   . Hereditary and idiopathic neuropathy   . History of depression   . Hypertension   . MCI (mild cognitive impairment) 10/22/2013  . Memory deficit 03/24/2011  . Memory loss   . Movement disorder   . Neuromuscular disorder (HCC)    peripheral  neuropathy  . Neuropathy   . Osteoarthritis   . Other hammer toe(s) (acquired), unspecified foot   . Pacemaker   . Parkinson's disease (HCC)   . Parkinsonism (HCC) 10/22/2013  . Shortness of breath   . Spinal stenosis   . Synovitis and tenosynovitis, unspecified   . Third degree heart block (HCC)   . Tremor    Past Surgical History:  Procedure Laterality Date  . CATARACT EXTRACTION  06/2005  . COLONOSCOPY  06/13/2006   PH colon polyps (MUS) repeat 7/20  . CORRECTION HAMMER TOE    . defibrilator  04/04/2012   Dual Chamber  . INSERT / REPLACE / REMOVE PACEMAKER  04/04/2012  . JOINT REPLACEMENT Right    THA  . JOINT REPLACEMENT Bilateral    TKA  . KNEE ARTHROSCOPY Bilateral    Right x 2 and Left  . KNEE SURGERY  [phone]  . LEAD REVISION N/A 04/05/2012   Procedure: LEAD REVISION;  Surgeon: Randy Salvia, MD;  Location: [facility] CATH LAB;  Service:  Cardiovascular;  Laterality: N/A;  . PERMANENT PACEMAKER INSERTION N/A 04/04/2012   Procedure: PERMANENT PACEMAKER INSERTION;  Surgeon: Randy MawGregg W Taylor, MD;  [facility];  Service: Cardiovascular;  Laterality: N/A;  . REPLACEMENT TOTAL HIP W/  RESURFACING IMPLANTS    . REPLACEMENT TOTAL KNEE BILATERAL  1997  . ROTATOR CUFF REPAIR  06/2008  . ROTATOR CUFF REPAIR Left 2010  . SHOULDER ARTHROSCOPY Left 06/19/2008   WITH SUBACROMIAL DECOMPRESSION. DISTAL CLAVICLE EXCISION. ARTHROSCOPIC ROTATOR CUFF REPAIR.  Marland Kitchen. TOTAL HIP ARTHROPLASTY  2009   right  . UMBILICAL HERNIA REPAIR  2001  . VASECTOMY       A IV  Location/Drains/Wounds Patient Lines/Drains/Airways Status   Active Line/Drains/Airways    Name:   Placement date:   Placement time:   Site:   Days:   Peripheral IV 04/22/19 Right Forearm   04/22/19    0950    Forearm   less than 1   Incision 04/04/12 Chest Left;Upper   04/04/12    1458     2574          Intake/Output Last 24 hours No intake or output data in the 24 hours ending 04/22/19 1628  Labs/Imaging Results for orders placed or performed during the hospital encounter of 04/22/19 (from the past 48 hour(s))  CBC with Differential     Status: Abnormal   Collection Time: 04/22/19  9:51 AM  Result Value Ref Range   WBC 11.8 (H) 4.0 - 10.5 K/uL   RBC 3.67 (L) 4.22 - 5.81 MIL/uL   Hemoglobin 12.7 (L) 13.0 - 17.0 g/dL   HCT 16.138.6 (L) 09.639.0 - 04.552.0 %   MCV 105.2 (H) 80.0 - 100.0 fL   MCH 34.6 (H) 26.0 - 34.0 pg   MCHC 32.9 30.0 - 36.0 g/dL   RDW 40.913.0 81.111.5 - 91.415.5 %   Platelets 121 (L) 150 - 400 K/uL    Comment: Immature Platelet Fraction may be clinically indicated, consider ordering this additional test [id number]    nRBC 0.0 0.0 - 0.2 %   Neutrophils Relative % 78 %   Neutro Abs 9.3 (H) 1.7 - 7.7 K/uL   Lymphocytes Relative 14 %   Lymphs Abs 1.6 0.7 - 4.0 K/uL   Monocytes Relative 6 %   Monocytes Absolute 0.7 0.1 - 1.0 K/uL   Eosinophils Relative 1 %   Eosinophils Absolute 0.2 0.0 - 0.5 K/uL   Basophils Relative 0 %   Basophils Absolute 0.0 0.0 - 0.1 K/uL   Immature Granulocytes 1 %   Abs Immature Granulocytes 0.07 0.00 - 0.07 K/uL    Comment: Performed at [facility], [address] [place], [place] [address]  Comprehensive metabolic panel     Status: Abnormal   Collection Time: 04/22/19  9:51 AM  Result Value Ref Range   Sodium 140 135 - 145 mmol/L   Potassium 4.1 3.5 - 5.1 mmol/L   Chloride 105 98 - 111 mmol/L   CO2 26 22 - 32 mmol/L   Glucose, Bld 116 (H) 70 - 99 mg/dL   BUN 23 8 - 23 mg/dL   Creatinine, Ser 7.840.96 0.61 - 1.24 mg/dL   Calcium 8.8 (L)  8.9 - 10.3 mg/dL   Total Protein 6.3 (L) 6.5 - 8.1 g/dL   Albumin 4.0 3.5 - 5.0 g/dL   AST 19 15 - 41 U/L   ALT 9 0 - 44 U/L   Alkaline Phosphatase 61 38 - 126 U/L   Total  Bilirubin 2.0 (H) 0.3 - 1.2 mg/dL   GFR calc non Af Amer >60 >60 mL/min   GFR calc Af Amer >60 >60 mL/min   Anion gap 9 5 - 15    Comment: Performed at [name], [address] [place], [place] [address]  Lipase, blood     Status: None   Collection Time: 04/22/19  9:51 AM  Result Value Ref Range   Lipase 21 11 - 51 U/L    Comment: Performed at [facility], [address] [place], [place] [address]  Protime-INR     Status: Abnormal   Collection Time: 04/22/19  9:51 AM  Result Value Ref Range   Prothrombin Time 25.0 (H) 11.4 - 15.2 seconds   INR 2.3 (H) 0.8 - 1.2    Comment: (NOTE) INR goal varies based on device and disease states. Performed at [facility], [address] [place], [place] [address]   APTT     Status: Abnormal   Collection Time: 04/22/19  9:51 AM  Result Value Ref Range   aPTT 46 (H) 24 - 36 seconds    Comment:        IF BASELINE aPTT IS ELEVATED, SUGGEST PATIENT RISK ASSESSMENT BE USED TO DETERMINE APPROPRIATE ANTICOAGULANT THERAPY. Performed at [facility], [address] [place], [place] [address]   Urinalysis, Complete w Microscopic     Status: Abnormal   Collection Time: 04/22/19 11:15 AM  Result Value Ref Range   Color, Urine AMBER (A) YELLOW    Comment: BIOCHEMICALS MAY BE AFFECTED BY COLOR   APPearance CLOUDY (A) CLEAR   Specific Gravity, Urine 1.014 1.005 - 1.030   pH 6.0 5.0 - 8.0   Glucose, UA NEGATIVE NEGATIVE mg/dL   Hgb urine dipstick MODERATE (A) NEGATIVE   Bilirubin Urine NEGATIVE NEGATIVE   Ketones, ur 5 (A) NEGATIVE mg/dL   Protein, ur 962 (A) NEGATIVE mg/dL   Nitrite POSITIVE (A) NEGATIVE   Leukocytes,Ua MODERATE (A) NEGATIVE   RBC / HPF >50 (H) 0 - 5 RBC/hpf   WBC, UA >50 (H) 0 - 5 WBC/hpf   Bacteria, UA MANY  (A) NONE SEEN   Squamous Epithelial / LPF 0-5 0 - 5   WBC Clumps PRESENT    Mucus PRESENT     Comment: Performed at [facility], [address] [place], [place] [address]  SARS Coronavirus 2 [facility] order, Performed in [name] hospital lab)     Status: None   Collection Time: 04/22/19 12:55 PM  Result Value Ref Range   SARS Coronavirus 2 NEGATIVE NEGATIVE    Comment: (NOTE) If result is NEGATIVE SARS-CoV-2 target nucleic acids are NOT DETECTED. The SARS-CoV-2 RNA is generally detectable in upper and lower  respiratory specimens during the acute phase of infection. The lowest  concentration of SARS-CoV-2 viral copies this assay can detect is 250  copies / mL. A negative result does not preclude SARS-CoV-2 infection  and should not be used as the sole basis for treatment or other  patient management decisions.  A negative result may occur with  improper specimen collection / handling, submission of specimen other  than nasopharyngeal swab, presence of viral mutation(s) within the  areas targeted by this assay, and inadequate number of viral copies  (<250 copies / mL). A negative result must be combined with clinical  observations, patient history, and epidemiological information. If result is POSITIVE SARS-CoV-2 target nucleic acids are DETECTED. The SARS-CoV-2 RNA is generally  detectable in upper and lower  respiratory specimens dur ing the acute phase of infection.  Positive  results are indicative of active infection with SARS-CoV-2.  Clinical  correlation with patient history and other diagnostic information is  necessary to determine patient infection status.  Positive results do  not rule out bacterial infection or co-infection with other viruses. If result is PRESUMPTIVE POSTIVE SARS-CoV-2 nucleic acids MAY BE PRESENT.   A presumptive positive result was obtained on the submitted specimen  and confirmed on repeat testing.  While 2019 novel coronavirus   (SARS-CoV-2) nucleic acids may be present in the submitted sample  additional confirmatory testing may be necessary for epidemiological  and / or clinical management purposes  to differentiate between  SARS-CoV-2 and other Sarbecovirus currently known to infect humans.  If clinically indicated additional testing with an alternate test  methodology [id number]) is advised. The SARS-CoV-2 RNA is generally  detectable in upper and lower respiratory sp ecimens during the acute  phase of infection. The expected result is Negative. Fact Sheet for Patients:  BoilerBrush.com.cy Fact Sheet for Healthcare Providers: https://pope.com/ This test is not yet approved or cleared by the [place] FDA and has been authorized for detection and/or diagnosis of SARS-CoV-2 by FDA under an Emergency Use Authorization (EUA).  This EUA will remain in effect (meaning this test can be used) for the duration of the COVID-19 declaration under Section 564(b)(1) of the Act, 21 U.S.C. section 360bbb-3(b)(1), unless the authorization is terminated or revoked sooner. Performed at [facility], [address] [place], [place] [address]   Glucose, capillary     Status: Abnormal   Collection Time: 04/22/19  3:15 PM  Result Value Ref Range   Glucose-Capillary 102 (H) 70 - 99 mg/dL   Dg Chest 2 View  Result Date: 04/22/2019 CLINICAL DATA:  Right sided facial droop and left arm weakness. EXAM: CHEST - 2 VIEW COMPARISON:  10/15/2014 FINDINGS: Chronic dual lead pacemaker. Chronic cardiomegaly. Chronic aortic atherosclerosis. Slight worsening of bilateral lower lobe atelectasis. Patchy basilar pneumonia not excluded. Upper lobes remain clear. IMPRESSION: Slight worsening of patchy density at both lung bases that could be simple atelectasis or mild bibasilar pneumonia. Electronically Signed   By: Paulina Fusi M.D.   On: 04/22/2019 10:40   Ct Head Wo  Contrast  Result Date: 04/22/2019 CLINICAL DATA:  RIGHT-sided facial droop and LEFT arm weakness. History of Parkinson's disease. Patient is anticoagulated. EXAM: CT HEAD WITHOUT CONTRAST TECHNIQUE: Contiguous axial images were obtained from the base of the skull through the vertex without intravenous contrast. COMPARISON:  CT head 11/14/2016. FINDINGS: Brain: No evidence of acute infarction, hemorrhage, hydrocephalus, extra-axial collection or mass lesion/mass effect. Generalized atrophy. Hypoattenuation of white matter, likely small vessel disease. Vascular: Calcification of the cavernous internal carotid arteries consistent with cerebrovascular atherosclerotic disease. No signs of intracranial large vessel occlusion. Skull: Calvarium intact. Sinuses/Orbits: No acute finding. Other: Compared with priors, similar appearance. IMPRESSION: Atrophy and small vessel disease. No acute intracranial findings. Electronically Signed   By: Elsie Stain M.D.   On: 04/22/2019 10:28    Pending Labs Unresulted Labs (From admission, onward)    Start     Ordered   04/23/19 0500  Basic metabolic panel  Tomorrow morning,   STAT     04/22/19 1627   04/23/19 0500  CBC  Tomorrow morning,   STAT     04/22/19 1627   04/22/19 1628  CBC  (heparin)  Once,   STAT  Comments:  Baseline for heparin therapy IF NOT ALREADY DRAWN.  Notify MD if PLT < 100 K.    04/22/19 1627   04/22/19 1628  Creatinine, serum  (heparin)  Once,   STAT    Comments:  Baseline for heparin therapy IF NOT ALREADY DRAWN.    04/22/19 1627   04/22/19 1245  Culture, blood (Routine X 2) w Reflex to ID Panel  BLOOD CULTURE X 2,   STAT    Question:  Patient immune status  Answer:  Normal   04/22/19 1245   04/22/19 1245  Urine Culture  Add-on,   AD    Question:  Patient immune status  Answer:  Normal   04/22/19 1245          Vitals/Pain Today's Vitals   04/22/19 1000 04/22/19 1001 04/22/19 1230 04/22/19 1518  BP: (!) 117/56   138/78  Pulse:  65 64 93 96  Resp: Temp:    98.6 F (37 C)  TempSrc:    Oral  SpO2: 97% 97% 97% 94%  Weight:      Height:      PainSc:    0-No pain    Isolation Precautions Droplet and Contact precautions  Medications Medications  acetaminophen (TYLENOL) tablet 650 mg (has no administration in time range)  lisinopril (ZESTRIL) tablet 40 mg (has no administration in time range)  ondansetron (ZOFRAN) tablet 4 mg (has no administration in time range)  pantoprazole (PROTONIX) EC tablet 40 mg (has no administration in time range)  sennosides-docusate sodium (SENOKOT-S) 8.6-50 MG tablet 2 tablet (has no administration in time range)  tamsulosin (FLOMAX) capsule 0.4 mg (has no administration in time range)  Rivaroxaban (XARELTO) tablet 15 mg (has no administration in time range)  Melatonin TABS 3 mg (has no administration in time range)  fluticasone (FLONASE) 50 MCG/ACT nasal spray 1 spray (has no administration in time range)  ipratropium (ATROVENT) 0.03 % nasal spray 2 spray (has no administration in time range)  ipratropium-albuterol (DUONEB) 0.5-2.5 (3) MG/3ML nebulizer solution 3 mL (has no administration in time range)  docusate sodium (COLACE) capsule 100 mg (has no administration in time range)  heparin injection 5,000 Units (has no administration in time range)  cefTRIAXone (ROCEPHIN) 1 g in sodium chloride 0.9 % 100 mL IVPB (has no administration in time range)  carbidopa-levodopa (SINEMET IR) 25-250 MG per tablet immediate release 1 tablet (has no administration in time range)  sodium chloride 0.9 % bolus 500 mL (0 mLs Intravenous Stopped 04/22/19 1350)  cefTRIAXone (ROCEPHIN) 1 g in sodium chloride 0.9 % 100 mL IVPB (0 g Intravenous Stopped 04/22/19 1324)    Mobility walks with device High fall risk   Focused Assessments Neuro Assessment Handoff:  Swallow screen pass? Yes  Cardiac Rhythm: Ventricular paced NIH Stroke Scale ( + Modified Stroke Scale Criteria)  Interval:  Shift assessment Level of Consciousness (1a.)   : Alert, keenly responsive LOC Questions (1b. )   +: Answers one question correctly LOC Commands (1c. )   + : Performs both tasks correctly Best Gaze (2. )  +: Normal Visual (3. )  +: No visual loss Facial Palsy (4. )    : Normal symmetrical movements Motor Arm, Left (5a. )   +: No drift Motor Arm, Right (5b. )   +: No drift Motor Leg, Left (6a. )   +: No drift Motor Leg, Right (6b. )   +: No drift Limb Ataxia (7. ): Absent  Sensory (8. )   +: Normal, no sensory loss Best Language (9. )   +: Mild-to-moderate aphasia Dysarthria (10. ): Normal Extinction/Inattention (11.)   +: No Abnormality Modified SS Total  +: 2 Complete NIHSS TOTAL: 2     Neuro Assessment: Exceptions to WDL Neuro Checks:   Shift assessment (04/22/19 1522)  Last Documented NIHSS Modified Score: 2 (04/22/19 1522) Has TPA been given? No If patient is a Neuro Trauma and patient is going to OR before floor call report to 4N Charge nurse: [phone] or [phone]     R Recommendations: See Admitting Provider Note  Report given to:   Additional Notes:

## 2019-04-22 NOTE — Progress Notes (Signed)
   04/22/19 1900  Clinical Encounter Type  Visited With Patient;Family  Visit Type Initial  Referral From Chaplain  Stress Factors  Family Stress Factors Health changes   Referral received from [facility] to check on the patient per request from granddaughter. Upon arrival, the patient was pleasantly confused. He was kind and welcoming, but disoriented. Dinner delivered during short conversation. Telephone call received from the patient's wife, Mrs. Waheed who wanted to check on her husband. The chaplain and Mrs. Kice talked briefly about the patient's current health status, their 60+ year marriage and the hardship that not seeing one another for the past two months has been. The chaplain gave the phone to the patient to talk with his wife, then ended the visit. Chaplain will also follow up with the patient's granddaughter.

## 2019-04-22 NOTE — ED Provider Notes (Signed)
[facility] Emergency Department Provider Note  ____________________________________________   First MD Initiated Contact with Patient 04/22/19 [phone]     (approximate)  I have reviewed the triage vital signs and the nursing notes.   HISTORY  Chief Complaint Altered Mental Status  EM caveat: Patient is confused, unable to provide history   HPI Randy [name] is a 85 y.o. male here for evaluation of fatigue  Reportedly had call for weakness, bilateral noticed facial weakness on one side and weakness in arm or leg on the opposite.  Call from his nursing home for increasing fatigue.  Patient reports he does not know why is here, he is not any pain.  He has not noticed any issues like weakness, but he also cannot recall.  No pain.  Patient cannot recall symptoms does not know why he is here today.  Past Medical History:  Diagnosis Date  . Anxiety   . Arthritis   . Blood transfusion    hx of autologus transfusion  . Cataract cortical, senile   . Chicken pox   . Clotting disorder (HCC)   . Complete heart block (HCC)   . Depression    unspecified  . DVT (deep venous thrombosis) (HCC) 09/2014   left leg  . GERD (gastroesophageal reflux disease)   . Hereditary and idiopathic neuropathy   . History of depression   . Hypertension   . MCI (mild cognitive impairment) 10/22/2013  . Memory deficit 03/24/2011  . Memory loss   . Movement disorder   . Neuromuscular disorder (HCC)    peripheral  neuropathy  . Neuropathy   . Osteoarthritis   . Other hammer toe(s) (acquired), unspecified foot   . Pacemaker   . Parkinson's disease (HCC)   . Parkinsonism (HCC) 10/22/2013  . Shortness of breath   . Spinal stenosis   . Synovitis and tenosynovitis, unspecified   . Third degree heart block (HCC)   . Tremor     Patient Active Problem List   Diagnosis Date Noted  . Memory deficit 01/30/2019  . General body deterioration 01/30/2019  . Palliative care  encounter 01/30/2019  . Major depression, recurrent, chronic (HCC) 11/25/2018  . Chronic anticoagulation 03/12/2018  . Sleep apnea-like behavior 07/17/2017  . GERD (gastroesophageal reflux disease) 02/16/2017  . Chronic allergic rhinitis 02/15/2017  . Vitamin B12 deficiency 02/15/2017  . Constipation, chronic 02/15/2017  . Chronic pain 02/15/2017  . NPH (normal pressure hydrocephalus) (HCC) 06/25/2015  . Chronic deep vein thrombosis (DVT) of proximal vein of lower extremity (HCC) 04/27/2015  . Nocturia more than twice per night 09/23/2014  . Spinal stenosis of lumbar region with radiculopathy 02/11/2014  . MCI (mild cognitive impairment) 10/22/2013  . Parkinson's disease (HCC) 10/07/2013  . Pacemaker-Medtronic 07/17/2012  . Hip pain 05/31/2012  . Osteoarthritis involving multiple joints on both sides of body 05/31/2012  . S/P hip replacement 05/29/2012  . S/P knee replacement 05/29/2012  . Complete heart block (HCC) 04/03/2012  . Essential hypertension 03/24/2011    Past Surgical History:  Procedure Laterality Date  . CATARACT EXTRACTION  06/2005  . COLONOSCOPY  06/13/2006   PH colon polyps (MUS) repeat 7/20  . CORRECTION HAMMER TOE    . defibrilator  04/04/2012   Dual Chamber  . INSERT / REPLACE / REMOVE PACEMAKER  04/04/2012  . JOINT REPLACEMENT Right    THA  . JOINT REPLACEMENT Bilateral    TKA  . KNEE ARTHROSCOPY Bilateral    Right x 2 and  Left  . KNEE SURGERY  [phone]  . LEAD REVISION N/A 04/05/2012   Procedure: LEAD REVISION;  Surgeon: Duke Salvia, MD;  Location: [facility] CATH LAB;  Service: Cardiovascular;  Laterality: N/A;  . PERMANENT PACEMAKER INSERTION N/A 04/04/2012   Procedure: PERMANENT PACEMAKER INSERTION;  Surgeon: Marinus Maw, MD;  Location: [facility] CATH LAB;  Service: Cardiovascular;  Laterality: N/A;  . REPLACEMENT TOTAL HIP W/  RESURFACING IMPLANTS    . REPLACEMENT TOTAL KNEE BILATERAL  1997  . ROTATOR CUFF REPAIR  06/2008  . ROTATOR CUFF REPAIR Left  2010  . SHOULDER ARTHROSCOPY Left 06/19/2008   WITH SUBACROMIAL DECOMPRESSION. DISTAL CLAVICLE EXCISION. ARTHROSCOPIC ROTATOR CUFF REPAIR.  Marland Kitchen TOTAL HIP ARTHROPLASTY  2009   right  . UMBILICAL HERNIA REPAIR  2001  . VASECTOMY      Prior to Admission medications   Medication Sig Start Date End Date Taking? Authorizing Provider  acetaminophen (TYLENOL) 325 MG tablet Take 650 mg by mouth 4 (four) times daily as needed for fever. Max of 3000 mg of Apap in 24 hrs   Yes [provider]  alum & mag hydroxide-simeth (MAALOX PLUS) 400-400-40 MG/5ML suspension Take 30 mLs by mouth every 4 (four) hours as needed for indigestion. 11/04/18  Yes [provider]  bisacodyl (BISCOLAX) 10 MG suppository Place 10 mg rectally 2 (two) times daily as needed for moderate constipation. Give until BM.   Yes [provider]  carbidopa-levodopa (SINEMET IR) 25-250 MG tablet Take 1 tablet by mouth 2 (two) times daily for 30 days. 04/15/19 05/15/19 Yes Medina-Vargas, Monina C, NP  cetirizine (ZYRTEC) 10 MG tablet Take 10 mg by mouth at bedtime.   Yes [provider]  desvenlafaxine (PRISTIQ) 50 MG 24 hr tablet Take 1 tablet (50 mg total) by mouth daily. 04/04/19  Yes Medina-Vargas, Monina C, NP  fluticasone (FLONASE) 50 MCG/ACT nasal spray Place 1 spray into both nostrils daily. 03/06/19  Yes Medina-Vargas, Monina C, NP  HYDROcodone-acetaminophen (NORCO/VICODIN) 5-325 MG tablet Take 1 tablet by mouth every 12 (twelve) hours as needed for moderate pain.   Yes [provider]  ipratropium (ATROVENT) 0.03 % nasal spray Place 2 sprays into both nostrils every 12 (twelve) hours.   Yes [provider]  ipratropium-albuterol (DUONEB) 0.5-2.5 (3) MG/3ML SOLN Take 3 mLs by nebulization every 6 (six) hours as needed (wheezing and shortness of breath with cough).   Yes [provider]  lisinopril (PRINIVIL,ZESTRIL) 40 MG tablet TAKE ONE TABLET BY MOUTH EVERY DAY 05/05/16  Yes  Duke Salvia, MD  LORazepam (ATIVAN) 0.5 MG tablet Take 0.5 mg by mouth daily. Hold for sedation   Yes [provider]  magnesium hydroxide (MILK OF MAGNESIA) 400 MG/5ML suspension Take 30 mLs by mouth every 4 (four) hours as needed. If constipation/ no BM for 2 days   Yes [provider]  Melatonin 3 MG TABS Take 1 tablet by mouth at bedtime. Should take 1 - 2 hours before sleep   Yes [provider]  ondansetron (ZOFRAN) 4 MG tablet Take 4 mg by mouth every 6 (six) hours as needed for nausea or vomiting.   Yes [provider]  pantoprazole (PROTONIX) 40 MG tablet Take 40 mg by mouth daily.   Yes [provider]  Rivaroxaban (XARELTO) 15 MG TABS tablet Take 15 mg by mouth daily with supper. for anticoagulation. Monitor for bleeding/bruising.   Yes [provider]  sennosides-docusate sodium (SENOKOT-S) 8.6-50 MG tablet Take 2 tablets  by mouth 2 (two) times daily.    Yes [provider]  sodium phosphate (FLEET) enema Place 1 enema rectally every three (3) days as needed. If constipation not relieved by milk of magnesia or bisacodyl suppository. Follow package directions   Yes [provider]  sorbitol 70 % solution Take 30 mLs by mouth every 2 (two) hours as needed. Constipation. Give PO Q 2 hours until pt has large BM. Give first dose now along with Bisacodyl suppository   Yes [provider]  tamsulosin (FLOMAX) 0.4 MG CAPS capsule Take 0.4 mg by mouth daily after supper. 30 mins after supper   Yes [provider]  zinc oxide 20 % ointment Apply 1 application topically as needed for irritation. Apply liberal amount to areas of skin irritation as needed   Yes [provider]    Allergies Citalopram; Codeine; Sertraline; Lodine [etodolac]; and Percocet [oxycodone-acetaminophen]  Family History  Problem Relation Age of Onset  . Hypertension Mother   . Heart disease Mother   . Hypertension Father    . Seizures Father        epilepsy  . Heart disease Father   . Prostate cancer Other   . Cancer Other   . Hypertension Other   . Asthma Other   . Hypertension Brother   . Breast cancer Daughter     Social History Social History   Tobacco Use  . Smoking status: Former Smoker    Packs/day: 1.00    Years: 25.00    Pack years: 25.00    Types: Cigarettes    Last attempt to quit: 06/24/1973    Years since quitting: 45.8  . Smokeless tobacco: Never Used  Substance Use Topics  . Alcohol use: Not Currently    Comment: occasional  . Drug use: No    Review of Systems  EM caveat   ____________________________________________   PHYSICAL EXAM:  VITAL SIGNS: ED Triage Vitals [04/22/19 0939]  Enc Vitals Group     BP 129/68     Pulse Rate (!) 59     Resp 18     Temp 98.7 F (37.1 C)     Temp Source Oral     SpO2 100 %     Weight 179 lb 9.6 oz (81.5 kg)     Height 5\' 10"  (1.778 m)     Head Circumference      Peak Flow      Pain Score      Pain Loc      Pain Edu?      Excl. in GC?     Constitutional: Alert and active to self but not place or situation.  Appears in no distress, generally fatigued Eyes: Conjunctivae are normal. Head: Atraumatic. Nose: No congestion/rhinnorhea. Mouth/Throat: Mucous membranes are moist. Neck: No stridor.  Cardiovascular: Normal rate, regular rhythm. Grossly normal heart sounds.  Good peripheral circulation. Respiratory: Normal respiratory effort.  No retractions. Lungs CTAB. Gastrointestinal: Soft and nontender. No distention. Musculoskeletal: No lower extremity tenderness nor edema.  Good use of upper extremities bilateral.  No pronator drift bilateral.  Some slight weakness in both lower legs but equal.  There is no facial droop.  There is no deficits. Neurologic:  Normal speech and language except very soft-spoken with Parkinson's-like features. No gross focal neurologic deficits are appreciated.  Skin:  Skin is warm, dry and  intact. No rash noted. Psychiatric: Mood and affect are normal. Speech and behavior are normal.  ____________________________________________   LABS (  all labs ordered are listed, but only abnormal results are displayed)  Labs Reviewed  CBC WITH DIFFERENTIAL/PLATELET - Abnormal; Notable for the following components:      Result Value   WBC 11.8 (*)    RBC 3.67 (*)    Hemoglobin 12.7 (*)    HCT 38.6 (*)    MCV 105.2 (*)    MCH 34.6 (*)    Platelets 121 (*)    Neutro Abs 9.3 (*)    All other components within normal limits  COMPREHENSIVE METABOLIC PANEL - Abnormal; Notable for the following components:   Glucose, Bld 116 (*)    Calcium 8.8 (*)    Total Protein 6.3 (*)    Total Bilirubin 2.0 (*)    All other components within normal limits  URINALYSIS, COMPLETE (UACMP) WITH MICROSCOPIC - Abnormal; Notable for the following components:   Color, Urine AMBER (*)    APPearance CLOUDY (*)    Hgb urine dipstick MODERATE (*)    Ketones, ur 5 (*)    Protein, ur 100 (*)    Nitrite POSITIVE (*)    Leukocytes,Ua MODERATE (*)    RBC / HPF >50 (*)    WBC, UA >50 (*)    Bacteria, UA MANY (*)    All other components within normal limits  PROTIME-INR - Abnormal; Notable for the following components:   Prothrombin Time 25.0 (*)    INR 2.3 (*)    All other components within normal limits  APTT - Abnormal; Notable for the following components:   aPTT 46 (*)    All other components within normal limits  SARS CORONAVIRUS 2 (HOSPITAL ORDER, PERFORMED IN [facility])  CULTURE, BLOOD (ROUTINE X 2)  CULTURE, BLOOD (ROUTINE X 2)  URINE CULTURE  LIPASE, BLOOD   ____________________________________________  EKG  Reviewed entered by me at 1112 Heart rate 80 QRS 200 QTc 520 Left bundle branch block due to pacing. ____________________________________________  RADIOLOGY  Dg Chest 2 View  Result Date: 04/22/2019 CLINICAL DATA:  Right sided facial droop and left arm weakness.  EXAM: CHEST - 2 VIEW COMPARISON:  10/15/2014 FINDINGS: Chronic dual lead pacemaker. Chronic cardiomegaly. Chronic aortic atherosclerosis. Slight worsening of bilateral lower lobe atelectasis. Patchy basilar pneumonia not excluded. Upper lobes remain clear. IMPRESSION: Slight worsening of patchy density at both lung bases that could be simple atelectasis or mild bibasilar pneumonia. Electronically Signed   By: Paulina Fusi M.D.   On: 04/22/2019 10:40   Ct Head Wo Contrast  Result Date: 04/22/2019 CLINICAL DATA:  RIGHT-sided facial droop and LEFT arm weakness. History of Parkinson's disease. Patient is anticoagulated. EXAM: CT HEAD WITHOUT CONTRAST TECHNIQUE: Contiguous axial images were obtained from the base of the skull through the vertex without intravenous contrast. COMPARISON:  CT head 11/14/2016. FINDINGS: Brain: No evidence of acute infarction, hemorrhage, hydrocephalus, extra-axial collection or mass lesion/mass effect. Generalized atrophy. Hypoattenuation of white matter, likely small vessel disease. Vascular: Calcification of the cavernous internal carotid arteries consistent with cerebrovascular atherosclerotic disease. No signs of intracranial large vessel occlusion. Skull: Calvarium intact. Sinuses/Orbits: No acute finding. Other: Compared with priors, similar appearance. IMPRESSION: Atrophy and small vessel disease. No acute intracranial findings. Electronically Signed   By: Elsie Stain M.D.   On: 04/22/2019 10:28    CT head negative for acute.  Chest x-ray questionable for atelectasis versus patchy bilateral lobe density. ____________________________________________   PROCEDURES  Procedure(s) performed: None  Procedures  Critical Care performed: No  ____________________________________________  INITIAL IMPRESSION / ASSESSMENT AND PLAN / ED COURSE  Pertinent labs & imaging results that were available during my care of the patient were reviewed by me and considered in my  medical decision making (see chart for details).   Patient is for altered mental status.  Increasing fatigue and weakness.  No report of bilateral symptoms including right-sided facial droop and left-sided weakness seems inconsistent with an acute stroke and at this time he has no deficits on my exam or by EMS stroke screen.  I doubt acute stroke, however he does appear fatigued increased weakness seems confused.  He is alert protecting airway well without hypoxia and his vital signs are quite reassuring.\  Evaluation consistent with urinary tract infection likely causing his altered mental status.  Also consideration for pneumonia is given, but I find in the setting with his obviously positive urinary tract infection, no hypoxia, no increased work of breathing, and clear lung sounds at pneumonia is unlikely to also be evident I suspect this is likely atelectasis.  Tyna Jaksch was evaluated in Emergency Department on 04/22/2019 for the symptoms described in the history of present illness. He was evaluated in the context of the global COVID-19 pandemic, which necessitated consideration that the patient might be at risk for infection with the SARS-CoV-2 virus that causes COVID-19. Institutional protocols and algorithms that pertain to the evaluation of patients at risk for COVID-19 are in a state of rapid change based on information released by regulatory bodies including the CDC and federal and state organizations. These policies and algorithms were followed during the patient's care in the ED.     COVID negative  ----------------------------------------- 2:55 PM on 04/22/2019 -----------------------------------------  Admission discussed with Dr. Elisabeth Pigeon, patient be admitted to the hospital service  ____________________________________________   FINAL CLINICAL IMPRESSION(S) / ED DIAGNOSES  Final diagnoses:  Lower urinary tract infection, acute  Disorientation        Note:   This document was prepared using Dragon voice recognition software and may include unintentional dictation errors       Sharyn Creamer, MD 04/22/19 1455

## 2019-04-22 NOTE — ED Triage Notes (Addendum)
Pt presents to ED via [facility] from [facility]. 911 call placed by staff for R-side facial droop and L arm weakness. EMS report their stroke screen was negative. No facial droop or focal weakness noted on arrival. EMS report foul odor of urine. On xarelto. Hx parkinson's, pacemaker. Pt is confused, able to answer some simple questions. Per SNF pt is more confused than usual. Denies pain at this time.

## 2019-04-22 NOTE — ED Notes (Signed)
Assumed care of patient patient follows commands, patient constantly speaking and moaning with blurred speech noted, denies pain or discomforts. BG 102. Vss, corona test Neg. Awaiting further plan of care. Safety maintained. Will monitor.

## 2019-04-22 NOTE — Progress Notes (Deleted)
[facility]  [facility] Room Number: 336 Place of Service:  SNF ([phone]) Provider:  Kenard GowerMedina-Vargas, Monina, DNP  Patient Care Team: Lauro RegulusAnderson, Marshall W, MD as PCP - General (Internal Medicine) Sharee HolsterGreen, Deborah S, NP as Nurse Practitioner (Geriatric Medicine)  Extended Emergency Contact Information Primary Emergency Contact: Marva PandaGilliam,Polly  [place] Home Phone: [phone] Mobile Phone: [phone] Relation: Spouse Secondary Emergency Contact: Gwynneth MunsonGilliam,Jeffery  [place] Mobile Phone: [phone] Relation: Son  Code Status:  DNR  Goals of care: Advanced Directive information Advanced Directives 03/25/2019  Does Patient Have a Medical Advance Directive? Yes  Type of Advance Directive Out of facility DNR (pink MOST or yellow form)  Does patient want to make changes to medical advance directive? No - Patient declined  Copy of Healthcare Power of Attorney in Chart? -  Would patient like information on creating a medical advance directive? -  Pre-existing out of facility DNR order (yellow form or pink MOST form) -     Chief Complaint  Patient presents with   Medical Management of Chronic Issues    Routine [facility] visit    HPI:  Pt is a 85 y.o. male seen today for medical management of chronic diseases.  He is a long-term care resident of [facility]. He has a PMH of anxiety, arthritis, DVT, hypertension, and depression.     Past Medical History:  Diagnosis Date   Anxiety    Arthritis    Blood transfusion    hx of autologus transfusion   Cataract cortical, senile    Chicken pox    Clotting disorder (HCC)    Complete heart block (HCC)    Depression    unspecified   DVT (deep venous thrombosis) (HCC) 09/2014   left leg   GERD (gastroesophageal reflux disease)    Hereditary and idiopathic neuropathy    History of depression    Hypertension    MCI (mild cognitive impairment) 10/22/2013   Memory deficit  03/24/2011   Memory loss    Movement disorder    Neuromuscular disorder (HCC)    peripheral  neuropathy   Neuropathy    Osteoarthritis    Other hammer toe(s) (acquired), unspecified foot    Pacemaker    Parkinson's disease (HCC)    Parkinsonism (HCC) 10/22/2013   Shortness of breath    Spinal stenosis    Synovitis and tenosynovitis, unspecified    Third degree heart block (HCC)    Tremor    Past Surgical History:  Procedure Laterality Date   CATARACT EXTRACTION  06/2005   COLONOSCOPY  06/13/2006   PH colon polyps (MUS) repeat 7/20   CORRECTION HAMMER TOE     defibrilator  04/04/2012   Dual Chamber   INSERT / REPLACE / REMOVE PACEMAKER  04/04/2012   JOINT REPLACEMENT Right    THA   JOINT REPLACEMENT Bilateral    TKA   KNEE ARTHROSCOPY Bilateral    Right x 2 and Left   KNEE SURGERY  [id number]   LEAD REVISION N/A 04/05/2012   Procedure: LEAD REVISION;  Surgeon: Duke SalviaSteven C Klein, MD;  [facility];  Service: Cardiovascular;  Laterality: N/A;   PERMANENT PACEMAKER INSERTION N/A 04/04/2012   Procedure: PERMANENT PACEMAKER INSERTION;  Surgeon: Marinus MawGregg W Taylor, MD;  [facility];  Service: Cardiovascular;  Laterality: N/A;   REPLACEMENT TOTAL HIP W/  RESURFACING IMPLANTS     REPLACEMENT TOTAL KNEE BILATERAL  1997   ROTATOR CUFF REPAIR  06/2008  ROTATOR CUFF REPAIR Left 2010   SHOULDER ARTHROSCOPY Left 06/19/2008   WITH SUBACROMIAL DECOMPRESSION. DISTAL CLAVICLE EXCISION. ARTHROSCOPIC ROTATOR CUFF REPAIR.   TOTAL HIP ARTHROPLASTY  2009   right   UMBILICAL HERNIA REPAIR  2001   VASECTOMY      Allergies  Allergen Reactions   Citalopram     Other reaction(s): Unknown   Codeine Nausea And Vomiting   Sertraline     Other reaction(s): Unknown Other reaction(s): Unknown   Lodine [Etodolac] Rash   Percocet [Oxycodone-Acetaminophen] Rash    Tolerates tylenol Patient is able to take this recently    Outpatient Encounter  Medications as of 04/22/2019  Medication Sig   acetaminophen (TYLENOL) 325 MG tablet Take 650 mg by mouth 4 (four) times daily as needed for fever. Max of 3000 mg of Apap in 24 hrs   alum & mag hydroxide-simeth (MAALOX PLUS) 400-400-40 MG/5ML suspension Take 30 mLs by mouth every 4 (four) hours as needed for indigestion.   bisacodyl (BISCOLAX) 10 MG suppository Place 10 mg rectally 2 (two) times daily as needed for moderate constipation. Give until BM.   carbidopa-levodopa (SINEMET IR) 25-250 MG tablet Take 1 tablet by mouth 2 (two) times daily for 30 days.   cetirizine (ZYRTEC) 10 MG tablet Take 10 mg by mouth at bedtime.   desvenlafaxine (PRISTIQ) 50 MG 24 hr tablet Take 1 tablet (50 mg total) by mouth daily.   fluticasone (FLONASE) 50 MCG/ACT nasal spray Place 1 spray into both nostrils daily.   HYDROcodone-acetaminophen (NORCO/VICODIN) 5-325 MG tablet Take 1 tablet by mouth every 12 (twelve) hours as needed for moderate pain.   ipratropium (ATROVENT) 0.03 % nasal spray Place 2 sprays into both nostrils every 12 (twelve) hours.   ipratropium-albuterol (DUONEB) 0.5-2.5 (3) MG/3ML SOLN Take 3 mLs by nebulization every 6 (six) hours as needed (wheezing and shortness of breath with cough).   lisinopril (PRINIVIL,ZESTRIL) 40 MG tablet TAKE ONE TABLET BY MOUTH EVERY DAY   LORazepam (ATIVAN) 0.5 MG tablet Take 0.5 mg by mouth daily. Hold for sedation   magnesium hydroxide (MILK OF MAGNESIA) 400 MG/5ML suspension Take 30 mLs by mouth every 4 (four) hours as needed. If constipation/ no BM for 2 days   Melatonin 3 MG TABS Take 1 tablet by mouth at bedtime. Should take 1 - 2 hours before sleep   NON FORMULARY Diet Type: Regular diet   ondansetron (ZOFRAN) 4 MG tablet Take 4 mg by mouth every 6 (six) hours as needed for nausea or vomiting.   OXYGEN Inhale 2 L into the lungs as needed. Check O2 sat prior to placing on patient and at least every 4 hours after applying. Notify MD if O2 sat drop  below baseline while receiving oxygen or no improvement in dyspnea   pantoprazole (PROTONIX) 40 MG tablet Take 40 mg by mouth daily.   Rivaroxaban (XARELTO) 15 MG TABS tablet Take 15 mg by mouth daily with supper. for anticoagulation. Monitor for bleeding/bruising.   sennosides-docusate sodium (SENOKOT-S) 8.6-50 MG tablet Take 2 tablets by mouth 2 (two) times daily.    Skin Protectants, Misc. (ENDIT EX) Apply 1 application topically as needed (Skin irritation).   sodium phosphate (FLEET) enema Place 1 enema rectally every three (3) days as needed. If constipation not relieved by milk of magnesia or bisacodyl suppository. Follow package directions   sorbitol 70 % solution Take 30 mLs by mouth every 2 (two) hours as needed. Constipation. Give PO Q 2 hours until pt has  large BM. Give first dose now along with Bisacodyl suppository   tamsulosin (FLOMAX) 0.4 MG CAPS capsule Take 0.4 mg by mouth daily after supper. 30 mins after supper   [DISCONTINUED] HYDROcodone-acetaminophen (NORCO/VICODIN) 5-325 MG tablet Take 1 tablet by mouth every 12 (twelve) hours. pain. hold for sedation. . Maximum of 3000 mg of Acetaminophen in 24 hours.   [DISCONTINUED] ketoconazole (NIZORAL) 2 % cream Apply 1 application topically 2 (two) times daily.   No facility-administered encounter medications on file as of 04/22/2019.     Review of Systems  GENERAL: No change in appetite, no fatigue, no weight changes, no fever, chills or weakness SKIN: Denies rash, itching, wounds, ulcer sores, or nail abnormalities EYES: Denies change in vision, dry eyes, eye pain, itching or discharge EARS: Denies change in hearing, ringing in ears, or earache NOSE: Denies nasal congestion or epistaxis MOUTH and THROAT: Denies oral discomfort, gingival pain or bleeding, pain from teeth or hoarseness   RESPIRATORY: no cough, SOB, DOE, wheezing, hemoptysis CARDIAC: No chest pain, edema or palpitations GI: No abdominal pain, diarrhea,  constipation, heart burn, nausea or vomiting GU: Denies dysuria, frequency, hematuria, incontinence, or discharge MUSCULOSKELETAL: Denies joint pain, muscle pain, back pain, restricted movement, or unusual weakness CIRCULATION: Denies claudication, edema of legs, varicosities, or cold extremities NEUROLOGICAL: Denies dizziness, syncope, numbness, or headache PSYCHIATRIC: Denies feelings of depression or anxiety. No report of hallucinations, insomnia, paranoia, or agitation ENDOCRINE: Denies polyphagia, polyuria, polydipsia, heat or cold intolerance HEME/LYMPH: Denies excessive bruising, petechia, enlarged lymph nodes, or bleeding problems IMMUNOLOGIC: Denies history of frequent infections, AIDS, or use of immunosuppressive agents   Immunization History  Administered Date(s) Administered   Influenza-Unspecified 10/20/2014, 09/10/2016, 09/02/2017, 10/12/2018   PPD Test 12/05/2015, 11/26/2016, 11/11/2018   Pneumococcal Polysaccharide-23 03/13/2014   Pneumococcal-Unspecified 10/20/2014   Pertinent  Health Maintenance Due  Topic Date Due   PNA vac Low Risk Adult (2 of 2 - PCV13) 10/21/2015   INFLUENZA VACCINE  07/13/2019   Fall Risk  10/30/2018  Falls in the past year? 0     Vitals:   04/22/19 0838  BP: 134/60  Pulse: 67  Resp: 18  Temp: (!) 97.4 F (36.3 C)  TempSrc: Oral  SpO2: 98%  Weight: 179 lb 9.6 oz (81.5 kg)  Height:  (1.778 m)   Body mass index is 25.77 kg/m.  Physical Exam  GENERAL APPEARANCE: Well nourished. In no acute distress. Normal body habitus SKIN:  Skin is warm and dry. There are no suspicious lesions or rash HEAD: Normal in size and contour. No evidence of trauma EYES: Lids open and close normally. No blepharitis, entropion or ectropion. PERRL. Conjunctivae are clear and sclerae are white. Lenses are without opacity EARS: Pinnae are normal. Patient hears normal voice tunes of the examiner MOUTH and THROAT: Lips are without lesions. Oral  mucosa is moist and without lesions. Tongue is normal in shape, size, and color and without lesions NECK: supple, trachea midline, no neck masses, no thyroid tenderness, no thyromegaly LYMPHATICS: No LAN in the neck, no supraclavicular LAN RESPIRATORY: Breathing is even & unlabored, BS CTAB CARDIAC: RRR, no murmur,no extra heart sounds, no edema GI: Abdomen soft, normal BS, no masses, no tenderness, no hepatomegaly, no splenomegaly MUSCULOSKELETAL: No deformities. Movement at each extremity is full and painless. Strength is 5/5 at each extremity. Back is without kyphosis or scoliosis CIRCULATION: Pedal pulses are 2+. There is no edema of the legs, ankles and feet NEUROLOGICAL: There is no tremor. Speech is  clear PSYCHIATRIC: Alert and oriented X 3. Affect and behavior are appropriate  Labs reviewed: Recent Labs    09/13/18 1425 12/27/18 0620  NA 141 139  K 4.4 4.8  CL 106 107  CO2 28 27  GLUCOSE 116* 101*  BUN 20 20  CREATININE 1.12 0.91  CALCIUM 9.0 8.8*   Recent Labs    09/13/18 1425 12/27/18 0620  AST 14* 14*  ALT 8 8  ALKPHOS 59 64  BILITOT 0.9 0.9  PROT 6.4* 6.2*  ALBUMIN 4.2 3.9   Recent Labs    08/24/18 1503 09/13/18 1425 12/27/18 0620  WBC 5.7 5.4 5.9  NEUTROABS 2.6 3.1 2.6  HGB 13.4 12.6* 12.8*  HCT 40.0 37.9* 40.4  MCV 103.0* 103.3* 107.4*  PLT 122* 147* 137*   Lab Results  Component Value Date   TSH 1.231 09/13/2018    Assessment/Plan ***   Family/ staff Communication: ***  Labs/tests ordered:  ***  Goals of care:   Long-term care.   Kenard Gower, DNP [facility] [phone] (Monday-Friday 8:00 a.m. - 5:00 p.m.) [phone] (after hours)

## 2019-04-22 NOTE — ED Notes (Signed)
Pt's spouse called Treavon Salters [phone]) with pt's permission and given update on likely admission for UTI and PNA.

## 2019-04-23 ENCOUNTER — Inpatient Hospital Stay: Payer: Medicare Other

## 2019-04-23 DIAGNOSIS — L8990 Pressure ulcer of unspecified site, unspecified stage: Secondary | ICD-10-CM

## 2019-04-23 LAB — IRON AND TIBC
Iron: 9 ug/dL — ABNORMAL LOW (ref 45–182)
Saturation Ratios: 5 % — ABNORMAL LOW (ref 17.9–39.5)
TIBC: 171 ug/dL — ABNORMAL LOW (ref 250–450)
UIBC: 162 ug/dL

## 2019-04-23 LAB — BLOOD CULTURE ID PANEL (REFLEXED)

## 2019-04-23 LAB — BASIC METABOLIC PANEL
Anion gap: 10 (ref 5–15)
BUN: 27 mg/dL — ABNORMAL HIGH (ref 8–23)
CO2: 25 mmol/L (ref 22–32)
Calcium: 8.3 mg/dL — ABNORMAL LOW (ref 8.9–10.3)
Chloride: 104 mmol/L (ref 98–111)
Creatinine, Ser: 1.04 mg/dL (ref 0.61–1.24)
GFR calc Af Amer: >60 mL/min (ref >60)
GFR calc non Af Amer: >60 mL/min (ref >60)
Glucose, Bld: 123 mg/dL — ABNORMAL HIGH (ref 70–99)
Potassium: 3.9 mmol/L (ref 3.5–5.1)
Sodium: 139 mmol/L (ref 135–145)

## 2019-04-23 LAB — CBC
HCT: 35.4 % — ABNORMAL LOW (ref 39.0–52.0)
Hemoglobin: 11.6 g/dL — ABNORMAL LOW (ref 13.0–17.0)
MCH: 34.2 pg — ABNORMAL HIGH (ref 26.0–34.0)
MCHC: 32.8 g/dL (ref 30.0–36.0)
MCV: 104.4 fL — ABNORMAL HIGH (ref 80.0–100.0)
Platelets: 111 10*3/uL — ABNORMAL LOW (ref 150–400)
RBC: 3.39 MIL/uL — ABNORMAL LOW (ref 4.22–5.81)
RDW: 13.2 % (ref 11.5–15.5)
WBC: 11.9 10*3/uL — ABNORMAL HIGH (ref 4.0–10.5)
nRBC: 0 % (ref 0.0–0.2)

## 2019-04-23 LAB — VITAMIN B12: Vitamin B-12: 329 pg/mL (ref 180–914)

## 2019-04-23 LAB — BILIRUBIN, FRACTIONATED(TOT/DIR/INDIR)
Bilirubin, Direct: 0.2 mg/dL (ref 0.0–0.2)
Indirect Bilirubin: 0.7 mg/dL (ref 0.3–0.9)
Total Bilirubin: 0.9 mg/dL (ref 0.3–1.2)

## 2019-04-23 LAB — FOLATE: Folate: 6.4 ng/mL (ref >5.9)

## 2019-04-23 LAB — FERRITIN: Ferritin: 346 ng/mL — ABNORMAL HIGH (ref 24–336)

## 2019-04-23 LAB — LACTATE DEHYDROGENASE: LDH: 120 U/L (ref 98–192)

## 2019-04-23 MED ORDER — ORAL CARE MOUTH RINSE
15.0000 mL | Freq: Two times a day (BID) | OROMUCOSAL | Status: DC
  Administered 2019-04-24 – 2019-04-25 (×3): 15 mL via OROMUCOSAL

## 2019-04-23 MED ORDER — SODIUM CHLORIDE 0.9 % IV SOLN
1.0000 g | Freq: Three times a day (TID) | INTRAVENOUS | Status: DC
  Administered 2019-04-23 – 2019-04-24 (×4): 1 g via INTRAVENOUS
  Filled 2019-04-23 (×6): qty 1

## 2019-04-23 MED ORDER — SODIUM CHLORIDE 0.9 % IV SOLN
INTRAVENOUS | Status: DC | PRN
  Administered 2019-04-23 (×3): 250 mL via INTRAVENOUS

## 2019-04-23 NOTE — Progress Notes (Addendum)
[employer] [facility] [place] [facility]   PATIENT NAME:Randy [name] Randy QuillWilliam [name]    MR#:  [id number]  DATE OF BIRTH:  [birth year]  SUBJECTIVE:   Chief Complaint  Patient presents with   Altered Mental Status    Patient up with PT this morning. Has episodes of intermittent confusion but he is easily re-oriented. He is very hard of hearing and asks the same questions multiple times. Patient denies any concerns, state " I just need to drink more fluid".  REVIEW OF SYSTEMS:  Review of Systems  Constitutional: Negative for chills, fever, malaise/fatigue and weight loss.  HENT: Positive for hearing loss. Negative for congestion and sore throat.   Eyes: Negative for blurred vision and double vision.  Respiratory: Negative for cough, shortness of breath and wheezing.   Cardiovascular: Negative for chest pain, palpitations, orthopnea and leg swelling.  Gastrointestinal: Negative for abdominal pain, diarrhea, nausea and vomiting.  Genitourinary: Positive for frequency and urgency. Negative for dysuria.  Musculoskeletal: Positive for back pain and joint pain. Negative for myalgias.  Skin: Negative for rash.  Neurological: Positive for tremors. Negative for dizziness, sensory change, speech change, focal weakness and headaches.  Psychiatric/Behavioral: Negative for depression.   DRUG ALLERGIES:   Allergies  Allergen Reactions   Citalopram     Other reaction(s): Unknown   Codeine Nausea And Vomiting   Sertraline     Other reaction(s): Unknown Other reaction(s): Unknown   Lodine [Etodolac] Rash   Percocet [Oxycodone-Acetaminophen] Rash    Tolerates tylenol Patient is able to take this recently   VITALS:  Blood pressure 130/70, pulse 91, temperature 98.1 F (36.7 C), resp. rate 18, height 5\' 10"  (1.778 m), weight 81.5 kg, SpO2 99 %. PHYSICAL EXAMINATION:   GENERAL:  85 y.o.-year-old patient lying in the bed with no acute distress.  EYES: Pupils equal, round, reactive to  light and accommodation. No scleral icterus. Extraocular muscles intact.  HEENT: Head atraumatic, normocephalic. Oropharynx and nasopharynx clear.  NECK:  Supple, no jugular venous distention. No thyroid enlargement, no tenderness.  LUNGS: Normal breath sounds bilaterally, no wheezing, rales,rhonchi or crepitation. No use of accessory muscles of respiration.  CARDIOVASCULAR: S1, S2 normal. No murmurs, rubs, or gallops.  ABDOMEN: Soft, nontender, nondistended. Bowel sounds present. No organomegaly or mass.  EXTREMITIES: No pedal edema, cyanosis, or clubbing.  NEUROLOGIC: Cranial nerves II through XII are intact. Muscle strength 5/5 in all extremities. Sensation intact. Gait not checked.  PSYCHIATRIC: The patient is alert and oriented x 3.  SKIN: No obvious rash, lesion, or ulcer.   DATA REVIEWED:  LABORATORY PANEL:  Male CBC Recent Labs  Lab 04/23/19 0459  WBC 11.9*  HGB 11.6*  HCT 35.4*  PLT 111*   ------------------------------------------------------------------------------------------------------------------ Chemistries  Recent Labs  Lab 04/22/19 0951 04/23/19 0459 04/23/19 1013  NA 140 139  --   K 4.1 3.9  --   CL 105 104  --   CO2 26 25  --   GLUCOSE 116* 123*  --   BUN 23 27*  --   CREATININE 0.96 1.04  --   CALCIUM 8.8* 8.3*  --   AST 19  --   --   ALT 9  --   --   ALKPHOS 61  --   --   BILITOT 2.0*  --  0.9   RADIOLOGY:  Dg Chest 2 View  Result Date: 04/22/2019 CLINICAL DATA:  Right sided facial droop and left arm weakness. EXAM: CHEST - 2 VIEW COMPARISON:  10/15/2014 FINDINGS: Chronic dual lead pacemaker. Chronic cardiomegaly. Chronic aortic atherosclerosis. Slight worsening of bilateral lower lobe atelectasis. Patchy basilar pneumonia not excluded. Upper lobes remain clear. IMPRESSION: Slight worsening of patchy density at both lung bases that could be simple atelectasis or mild bibasilar pneumonia. Electronically Signed   By: Paulina Fusi M.D.   On:  04/22/2019 10:40   Ct Head Wo Contrast  Result Date: 04/22/2019 CLINICAL DATA:  RIGHT-sided facial droop and LEFT arm weakness. History of Parkinson's disease. Patient is anticoagulated. EXAM: CT HEAD WITHOUT CONTRAST TECHNIQUE: Contiguous axial images were obtained from the base of the skull through the vertex without intravenous contrast. COMPARISON:  CT head 11/14/2016. FINDINGS: Brain: No evidence of acute infarction, hemorrhage, hydrocephalus, extra-axial collection or mass lesion/mass effect. Generalized atrophy. Hypoattenuation of white matter, likely small vessel disease. Vascular: Calcification of the cavernous internal carotid arteries consistent with cerebrovascular atherosclerotic disease. No signs of intracranial large vessel occlusion. Skull: Calvarium intact. Sinuses/Orbits: No acute finding. Other: Compared with priors, similar appearance. IMPRESSION: Atrophy and small vessel disease. No acute intracranial findings. Electronically Signed   By: Elsie Stain M.D.   On: 04/22/2019 10:28   ASSESSMENT AND PLAN:   85 y.o. male with multiple medical history including DVT on anticoagulation, depression and anxiety, hypertension, MCI,  , heart block status post pacemaker insertion, idiopathic neuropathy, spinal stenosis and GERD presenting with increased fatigue, confusion and possible right side facial droop and left arm weakness per staff which was not noted on arrival to the ED.  1. Altered mental status - Likely due to urinary tract infection noted on initial labs. - CT head was negative for acute intracranial abnormality - Mental status improving with intermittent episodes of confusion  2.  UTI - Patient was started on IV ceftriaxone pending urine culture blood cultures - Urine culture positive for E. Coli - Blood cultures came back positive from gram-negative bacteria and E. coli therefore patient was switched to meropenem  3.  History of DVT -continue Xarelto  4.  Hypertension -  Continue lisinopril  5.  Parkinson's disease - Continue carbidopa levodopa - PT eval  6.  Elevated bilirubin levels with normal LFTs - Check ultrasound of abdomen  - Check labs: Haptoglobin, LDH, bilifractionated  7.  Thrombocytopenia - Patient not actively bleeding. Will recheck in the am and if persistent he may need oncology consult tomorrow. Looks like Dr Merlene Pulling saw him sometimes back for macrocytic anemia as above - Ultrasound ordered as well.  8.  Macrocytic anemia -Check labs: Iron, TIBC, folate, ferritin, vitamin B12 -Patient is following with hematology oncology Dr. Merlene Pulling on outpatient  9.  DVT prophylaxis -patient on Xarelto  All the records are reviewed and case discussed with Care Management/Social Worker. Management plans discussed with the patient, family and they are in agreement.  CODE STATUS: DNR  TOTAL TIME TAKING CARE OF THIS PATIENT: 37 minutes.   More than 50% of the time was spent in counseling/coordination of care: YES  POSSIBLE D/C IN 1 DAYS, DEPENDING ON CLINICAL CONDITION.   on 04/23/2019 at 3:05 PM  This patient was staffed with Dr. Orpha Bur, [facility] who personally evaluated patient, reviewed documentation and agreed with assessment and plan of care as above.  Webb Silversmith, DNP, FNP-BC [facility] Nurse Practitioner   Between 7am to 6pm - Pager [phone]  After 6pm go to [email or web address] - Scientist, research (life sciences) [facility]  Office  [phone]  CC: Primary care physician; Lauro Regulus, MD  Note: This dictation was prepared with Dragon dictation along with smaller phrase technology. Any transcriptional errors that result from this process are unintentional.

## 2019-04-23 NOTE — Evaluation (Signed)
Physical Therapy Evaluation Patient Details Name: Randy JakschWilliam Richard [name] MRN: [id number] DOB: Aug 19, 1934 Today's Date: 04/23/2019   History of Present Illness   85 y.o. male with a known history of Parkinson's, anxiety, clotting disorder, heart block, DVT, peripheral neuropathy, osteoarthritis, status post pacemaker-sent from the nursing home with complaint of increasing fatigue.  Admitted with UTI.  Clinical Impression  Pt with some mild confusion, but able to participate relatively well with PT exam.  Initially it did not seem that he would be able to manage more than quick transfer bed to recliner, but actually was able to participate with gait training to/from door with shuffling/stooped/AD reliant gait.  Mild improvement with cuing, direct assist to manipulate walker with turns.  Pt reports he does not typically walk much more than this at baseline, will benefit from PT as he returns to ALF.   Follow Up Recommendations Home health PT    Equipment Recommendations  None recommended by PT    Recommendations for Other Services       Precautions / Restrictions Precautions Precautions: Fall Restrictions Weight Bearing Restrictions: No      Mobility  Bed Mobility Overal bed mobility: Needs Assistance Bed Mobility: Supine to Sit     Supine to sit: Mod assist     General bed mobility comments: Pt showed some effort with getting to EOB, but ultimately was able to do little to initiate movement.  Overall needed considerable assist in getting to sitting.  Transfers Overall transfer level: Needs assistance Equipment used: Rolling walker (2 wheeled) Transfers: Sit to/from Stand Sit to Stand: Min assist         General transfer comment: elevated bed ~3", cues to position U&LEs to aid getting to stand   Ambulation/Gait Ambulation/Gait assistance: Min assist Gait Distance (Feet): 35 Feet Assistive device: Rolling walker (2 wheeled)       General Gait Details: Pt unable to  attain fully upright standing (knees stay bent) but did surprisingly well with ambulation to/from door with shuffling, labored gait.  Pt did not have any LOBs but did need assist with managing walker during turns/changes of direction  Stairs            Wheelchair Mobility    Modified Rankin (Stroke Patients Only)       Balance Overall balance assessment: Needs assistance Sitting-balance support: Feet supported;Single extremity supported Sitting balance-Leahy Scale: Fair Sitting balance - Comments: Pt hunched and leaning back, but once assisted to EOB he did mantain static sitting    Standing balance support: Bilateral upper extremity supported Standing balance-Leahy Scale: Fair Standing balance comment: Highly reliant on UEs and knees lack TKE, pt did not have any buckling or LOBs but at a minimum close CGA with all standing acts                             Pertinent Vitals/Pain Pain Assessment: No/denies pain    Home Living Family/patient expects to be discharged to:: Skilled nursing facility                      Prior Function Level of Independence: Needs assistance   Gait / Transfers Assistance Needed: Pt has w/c, reports he does some minimal walking/transfers?           Hand Dominance        Extremity/Trunk Assessment   Upper Extremity Assessment Upper Extremity Assessment: Generalized weakness(limited AROM, grossly 3+/5 in available range)  Lower Extremity Assessment Lower Extremity Assessment: Generalized weakness(lacks TKE b/l, grossly 3+/5 in available range)       Communication   Communication: No difficulties  Cognition Arousal/Alertness: Awake/alert Behavior During Therapy: WFL for tasks assessed/performed Overall Cognitive Status: Difficult to assess                                 General Comments: Pt knows month and year, confused on location/situation - struggles to answer most basic background  questions      General Comments      Exercises     Assessment/Plan    PT Assessment Patient needs continued PT services  PT Problem List Decreased strength;Decreased range of motion;Decreased activity tolerance;Decreased balance;Decreased mobility;Decreased coordination;Decreased safety awareness;Decreased knowledge of use of DME;Decreased cognition;Cardiopulmonary status limiting activity       PT Treatment Interventions DME instruction;Stair training;Gait training;Functional mobility training;Therapeutic activities;Therapeutic exercise;Balance training;Cognitive remediation;Patient/family education    PT Goals (Current goals can be found in the Care Plan section)  Acute Rehab PT Goals Patient Stated Goal: go back to facility PT Goal Formulation: With patient Time For Goal Achievement: 05/07/19 Potential to Achieve Goals: Fair    Frequency Min 2X/week   Barriers to discharge        Co-evaluation               AM-PAC PT "6 Clicks" Mobility  Outcome Measure Help needed turning from your back to your side while in a flat bed without using bedrails?: A Lot Help needed moving from lying on your back to sitting on the side of a flat bed without using bedrails?: A Lot Help needed moving to and from a bed to a chair (including a wheelchair)?: A Little Help needed standing up from a chair using your arms (e.g., wheelchair or bedside chair)?: A Little Help needed to walk in hospital room?: A Little Help needed climbing 3-5 steps with a railing? : Total 6 Click Score: 14    End of Session Equipment Utilized During Treatment: Gait belt Activity Tolerance: Patient limited by fatigue Patient left: with chair alarm set;with call bell/phone within reach Nurse Communication: Mobility status PT Visit Diagnosis: Muscle weakness (generalized) (M62.81);Difficulty in walking, not elsewhere classified (R26.2)    Time: 1025-8527 PT Time Calculation (min) (ACUTE ONLY): 28  min   Charges:   PT Evaluation $PT Eval Low Complexity: 1 Low PT Treatments $Gait Training: 8-22 mins        Malachi Pro, DPT 04/23/2019, 10:26 AM

## 2019-04-23 NOTE — [place] FL2 (Signed)
[place] MEDICAID FL2 LEVEL OF CARE SCREENING TOOL     IDENTIFICATION  Patient Name: Randy [name] Birthdate: 1934/09/09 Sex: male Admission Date (Current Location): 04/22/2019  [facility] and [place] Number:  Chiropodist and Address:  [facility], [address] [place], [place] [address]      Provider Number: [id number]  Attending Physician Name and Address:  Campbell Stall, MD  Relative Name and Phone Number:       Current Level of Care: Hospital Recommended Level of Care: Skilled Nursing Facility Prior Approval Number:    Date Approved/Denied:   PASRR Number:    Discharge Plan: SNF    Current Diagnoses: Patient Active Problem List   Diagnosis Date Noted  . Pressure injury of skin 04/23/2019  . Acute lower UTI 04/22/2019  . UTI (urinary tract infection) 04/22/2019  . Memory deficit 01/30/2019  . General body deterioration 01/30/2019  . Palliative care encounter 01/30/2019  . Major depression, recurrent, chronic (HCC) 11/25/2018  . Chronic anticoagulation 03/12/2018  . Sleep apnea-like behavior 07/17/2017  . GERD (gastroesophageal reflux disease) 02/16/2017  . Chronic allergic rhinitis 02/15/2017  . Vitamin B12 deficiency 02/15/2017  . Constipation, chronic 02/15/2017  . Chronic pain 02/15/2017  . NPH (normal pressure hydrocephalus) (HCC) 06/25/2015  . Chronic deep vein thrombosis (DVT) of proximal vein of lower extremity (HCC) 04/27/2015  . Nocturia more than twice per night 09/23/2014  . Spinal stenosis of lumbar region with radiculopathy 02/11/2014  . MCI (mild cognitive impairment) 10/22/2013  . Parkinson's disease (HCC) 10/07/2013  . Pacemaker-Medtronic 07/17/2012  . Hip pain 05/31/2012  . Osteoarthritis involving multiple joints on both sides of body 05/31/2012  . S/P hip replacement 05/29/2012  . S/P knee replacement 05/29/2012  . Complete heart block (HCC) 04/03/2012  . Essential hypertension 03/24/2011     Orientation RESPIRATION BLADDER Height & Weight     Self, Place  Normal Incontinent Weight: 179 lb 9.6 oz (81.5 kg) Height:  5\' 10"  (177.8 cm)  BEHAVIORAL SYMPTOMS/MOOD NEUROLOGICAL BOWEL NUTRITION STATUS  (none) (none) Continent Diet(NPO to be advanced )  AMBULATORY STATUS COMMUNICATION OF NEEDS Skin   Extensive Assist Verbally Other (Comment)(Stage 1 right heel )                       Personal Care Assistance Level of Assistance  Bathing, Feeding, Dressing Bathing Assistance: Limited assistance Feeding assistance: Independent Dressing Assistance: Limited assistance     Functional Limitations Info  Sight, Hearing, Speech Sight Info: Adequate Hearing Info: Adequate Speech Info: Adequate    SPECIAL CARE FACTORS FREQUENCY                       Contractures Contractures Info: Not present    Additional Factors Info  Code Status, Allergies Code Status Info: DNR Allergies Info: Citalopram, Codeine, Sertraline, Lodine, Percocet            Current Medications (04/23/2019):  This is the current hospital active medication list Current Facility-Administered Medications  Medication Dose Route Frequency Provider Last Rate Last Dose  . 0.9 %  sodium chloride infusion   Intravenous PRN Altamese Dilling, MD   Stopped at 04/23/19 [phone]  . acetaminophen (TYLENOL) tablet 650 mg  650 mg Oral QID PRN Altamese Dilling, MD   650 mg at 04/22/19 1901  . carbidopa-levodopa (SINEMET IR) 25-250 MG per tablet immediate release 1 tablet  1 tablet Oral BID Altamese Dilling, MD  1 tablet at 04/23/19 1020  . docusate sodium (COLACE) capsule 100 mg  100 mg Oral BID PRN Altamese DillingVachhani, Vaibhavkumar, MD      . fluticasone (FLONASE) 50 MCG/ACT nasal spray 1 spray  1 spray Each Nare Daily Altamese DillingVachhani, Vaibhavkumar, MD   1 spray at 04/23/19 1024  . ipratropium (ATROVENT) 0.03 % nasal spray 2 spray  2 spray Each Nare Q12H Altamese DillingVachhani, Vaibhavkumar, MD   2 spray at 04/23/19 1023  .  ipratropium-albuterol (DUONEB) 0.5-2.5 (3) MG/3ML nebulizer solution 3 mL  3 mL Nebulization Q6H PRN Altamese DillingVachhani, Vaibhavkumar, MD      . lisinopril (ZESTRIL) tablet 40 mg  40 mg Oral Daily Altamese DillingVachhani, Vaibhavkumar, MD      . Melatonin TABS 5 mg  5 mg Oral QHS Altamese DillingVachhani, Vaibhavkumar, MD   5 mg at 04/22/19 2012  . meropenem (MERREM) 1 g in sodium chloride 0.9 % 100 mL IVPB  1 g Intravenous Q8H Hallaji, Mardene SpeakSheema M, RPH   Stopped at 04/23/19 0531  . ondansetron (ZOFRAN) tablet 4 mg  4 mg Oral Q6H PRN Altamese DillingVachhani, Vaibhavkumar, MD      . pantoprazole (PROTONIX) EC tablet 40 mg  40 mg Oral Daily Altamese DillingVachhani, Vaibhavkumar, MD   40 mg at 04/23/19 1020  . Rivaroxaban (XARELTO) tablet 15 mg  15 mg Oral Q supper Altamese DillingVachhani, Vaibhavkumar, MD   15 mg at 04/22/19 2012  . senna-docusate (Senokot-S) tablet 2 tablet  2 tablet Oral BID Altamese DillingVachhani, Vaibhavkumar, MD   2 tablet at 04/23/19 1020  . tamsulosin (FLOMAX) capsule 0.4 mg  0.4 mg Oral QPC supper Altamese DillingVachhani, Vaibhavkumar, MD   0.4 mg at 04/22/19 1823     Discharge Medications: Please see discharge summary for a list of discharge medications.  Relevant Imaging Results:  Relevant Lab Results:   Additional Information    Minervia Osso  Rinaldo RatelGarrison, [address] Archer RdCSWA

## 2019-04-23 NOTE — TOC Initial Note (Signed)
Transition of Care [facility]) - Initial/Assessment Note    Patient Details  Name: Randy [name] MRN: [id number] Date of Birth: July 09, 1934  Transition of [facility]) CM/SW Contact:    Ruthe Mannan, LCSWA Phone Number: 04/23/2019, 2:49 PM  Clinical Narrative:  [facility] team following for discharge planning. CSW noted in chart review that patient is from [facility]. CSW contacted patient's wife Randy [name] [phone]. Wife states that patient lives at [facility] in [place] ([facility]) and has been there for 3 years. Per wife, [place] and patient moved to the [place] 5 years ago and patient moved to the SNF part of the facility 3 years ago. Wife continues to reside in Independent cottage at [place]. Wife states that patient should return to SNF at discharge. CSW spoke with Randy [name] at [facility] and confirmed that patient is from SNF and can return when medically stable. CSW will continue to follow for discharge planning.                   Expected Discharge Plan: Skilled Nursing Facility Barriers to Discharge: Continued Medical Work up   Patient Goals and CMS Choice Patient states their goals for this hospitalization and ongoing recovery are:: return to SNF  CMS Medicare.gov Compare Post Acute Care list provided to:: Patient Represenative (must comment)(Wife ) Choice offered to / list presented to : Spouse  Expected Discharge Plan and Services Expected Discharge Plan: Skilled Nursing Facility     Post Acute Care Choice: Skilled Nursing Facility Living arrangements for the past 2 months: Skilled Nursing Facility                                      Prior Living Arrangements/Services Living arrangements for the past 2 months: Skilled Nursing Facility Lives with:: Facility Resident Patient language and need for interpreter reviewed:: Yes Do you feel safe going back to the place where you live?: Yes      Need for Family Participation in Patient Care: Yes  (Comment) Care giver support system in place?: Yes (comment)   Criminal Activity/Legal Involvement Pertinent to Current Situation/Hospitalization: No - Comment as needed  Activities of Daily Living      Permission Sought/Granted Permission sought to share information with : Case Manager, Magazine features editor, Family Supports Permission granted to share information with : Yes, Verbal Permission Granted              Emotional Assessment Appearance:: Appears stated age   Affect (typically observed): Accepting, Pleasant Orientation: : Oriented to Self, Oriented to Place Alcohol / Substance Use: Not Applicable Psych Involvement: No (comment)  Admission diagnosis:  Disorientation [R41.0] Lower urinary tract infection, acute [N39.0] Patient Active Problem List   Diagnosis Date Noted  . Pressure injury of skin 04/23/2019  . Acute lower UTI 04/22/2019  . UTI (urinary tract infection) 04/22/2019  . Memory deficit 01/30/2019  . General body deterioration 01/30/2019  . Palliative care encounter 01/30/2019  . Major depression, recurrent, chronic (HCC) 11/25/2018  . Chronic anticoagulation 03/12/2018  . Sleep apnea-like behavior 07/17/2017  . GERD (gastroesophageal reflux disease) 02/16/2017  . Chronic allergic rhinitis 02/15/2017  . Vitamin B12 deficiency 02/15/2017  . Constipation, chronic 02/15/2017  . Chronic pain 02/15/2017  . NPH (normal pressure hydrocephalus) (HCC) 06/25/2015  . Chronic deep vein thrombosis (DVT) of proximal vein of lower extremity (HCC) 04/27/2015  . Nocturia more than twice per night  09/23/2014  . Spinal stenosis of lumbar region with radiculopathy 02/11/2014  . MCI (mild cognitive impairment) 10/22/2013  . Parkinson's disease (HCC) 10/07/2013  . Pacemaker-Medtronic 07/17/2012  . Hip pain 05/31/2012  . Osteoarthritis involving multiple joints on both sides of body 05/31/2012  . S/P hip replacement 05/29/2012  . S/P knee replacement  05/29/2012  . Complete heart block (HCC) 04/03/2012  . Essential hypertension 03/24/2011   PCP:  Lauro RegulusAnderson, Marshall W, MD Pharmacy:   [facility] [place] [address] [place] [address] Phone: [phone] Fax: [phone]  [facility] [address] [place], [place] - [address] [place] [address] Phone: [phone] Fax: [phone]  [facility] [place] - [address] [place] [address] Phone: [phone] Fax: [phone]     Social Determinants of Health (SDOH) Interventions    Readmission Risk Interventions No flowsheet data found.

## 2019-04-23 NOTE — Consult Note (Signed)
Pharmacy Antibiotic Note  Randy [name] is a 85 y.o. male admitted on 04/22/2019 with UTI. BCx results showing GNR and BCID showing E.Coli.  Pharmacy has been consulted for meropenem dosing.  Plan: Start meropenem 1g IV every 8 hours   Height: 5\' 10"  (177.8 cm) Weight: 179 lb 9.6 oz (81.5 kg) IBW/kg (Calculated) : 73  Temp (24hrs), Avg:98.9 F (37.2 C), Min:97.4 F (36.3 C), Max:102 F (38.9 C)  Recent Labs  Lab 04/22/19 0951  WBC 11.8*  CREATININE 0.96    Estimated Creatinine Clearance: 60.2 mL/min (by C-G formula based on SCr of 0.96 mg/dL).    Allergies  Allergen Reactions  . Citalopram     Other reaction(s): Unknown  . Codeine Nausea And Vomiting  . Sertraline     Other reaction(s): Unknown Other reaction(s): Unknown  . Lodine [Etodolac] Rash  . Percocet [Oxycodone-Acetaminophen] Rash    Tolerates tylenol Patient is able to take this recently    Antimicrobials this admission: 5/11 ceftriaxone  >> x1 5/12 meropenem >>   Microbiology results: 5/11 BCx: GNR, BCID: E. Coli 5/11 UCx: pending  Thank you for allowing pharmacy to be a part of this patient's care.  Gardner Candle, PharmD, BCPS Clinical Pharmacist 04/23/2019 3:40 AM

## 2019-04-24 ENCOUNTER — Inpatient Hospital Stay: Payer: Medicare Other

## 2019-04-24 DIAGNOSIS — N390 Urinary tract infection, site not specified: Principal | ICD-10-CM

## 2019-04-24 DIAGNOSIS — R7881 Bacteremia: Secondary | ICD-10-CM

## 2019-04-24 DIAGNOSIS — G20 Parkinson's disease: Secondary | ICD-10-CM

## 2019-04-24 DIAGNOSIS — Z96641 Presence of right artificial hip joint: Secondary | ICD-10-CM

## 2019-04-24 DIAGNOSIS — Z96653 Presence of artificial knee joint, bilateral: Secondary | ICD-10-CM

## 2019-04-24 DIAGNOSIS — Z885 Allergy status to narcotic agent status: Secondary | ICD-10-CM

## 2019-04-24 DIAGNOSIS — N3944 Nocturnal enuresis: Secondary | ICD-10-CM

## 2019-04-24 DIAGNOSIS — Z87891 Personal history of nicotine dependence: Secondary | ICD-10-CM

## 2019-04-24 DIAGNOSIS — Z950 Presence of cardiac pacemaker: Secondary | ICD-10-CM

## 2019-04-24 DIAGNOSIS — Z7901 Long term (current) use of anticoagulants: Secondary | ICD-10-CM

## 2019-04-24 DIAGNOSIS — B9620 Unspecified Escherichia coli [E. coli] as the cause of diseases classified elsewhere: Secondary | ICD-10-CM

## 2019-04-24 DIAGNOSIS — Z888 Allergy status to other drugs, medicaments and biological substances status: Secondary | ICD-10-CM

## 2019-04-24 DIAGNOSIS — Z86718 Personal history of other venous thrombosis and embolism: Secondary | ICD-10-CM

## 2019-04-24 LAB — CBC WITH DIFFERENTIAL/PLATELET
Abs Immature Granulocytes: 0.07 10*3/uL (ref 0.00–0.07)
Basophils Absolute: 0 10*3/uL (ref 0.0–0.1)
Basophils Relative: 0 %
Eosinophils Absolute: 0.1 10*3/uL (ref 0.0–0.5)
Eosinophils Relative: 1 %
HCT: 37 % — ABNORMAL LOW (ref 39.0–52.0)
Hemoglobin: 12.1 g/dL — ABNORMAL LOW (ref 13.0–17.0)
Immature Granulocytes: 1 %
Lymphocytes Relative: 19 %
Lymphs Abs: 1.8 10*3/uL (ref 0.7–4.0)
MCH: 34.8 pg — ABNORMAL HIGH (ref 26.0–34.0)
MCHC: 32.7 g/dL (ref 30.0–36.0)
MCV: 106.3 fL — ABNORMAL HIGH (ref 80.0–100.0)
Monocytes Absolute: 0.5 10*3/uL (ref 0.1–1.0)
Monocytes Relative: 5 %
Neutro Abs: 7.2 10*3/uL (ref 1.7–7.7)
Neutrophils Relative %: 74 %
Platelets: 119 10*3/uL — ABNORMAL LOW (ref 150–400)
RBC: 3.48 MIL/uL — ABNORMAL LOW (ref 4.22–5.81)
RDW: 13.2 % (ref 11.5–15.5)
WBC: 9.6 10*3/uL (ref 4.0–10.5)
nRBC: 0 % (ref 0.0–0.2)

## 2019-04-24 LAB — COMPREHENSIVE METABOLIC PANEL
ALT: 6 U/L (ref 0–44)
AST: 15 U/L (ref 15–41)
Albumin: 3.4 g/dL — ABNORMAL LOW (ref 3.5–5.0)
Alkaline Phosphatase: 52 U/L (ref 38–126)
Anion gap: 9 (ref 5–15)
BUN: 30 mg/dL — ABNORMAL HIGH (ref 8–23)
CO2: 25 mmol/L (ref 22–32)
Calcium: 8.6 mg/dL — ABNORMAL LOW (ref 8.9–10.3)
Chloride: 107 mmol/L (ref 98–111)
Creatinine, Ser: 1.18 mg/dL (ref 0.61–1.24)
GFR calc Af Amer: >60 mL/min (ref >60)
GFR calc non Af Amer: 57 mL/min — ABNORMAL LOW (ref >60)
Glucose, Bld: 103 mg/dL — ABNORMAL HIGH (ref 70–99)
Potassium: 3.7 mmol/L (ref 3.5–5.1)
Sodium: 141 mmol/L (ref 135–145)
Total Bilirubin: 0.9 mg/dL (ref 0.3–1.2)
Total Protein: 6.1 g/dL — ABNORMAL LOW (ref 6.5–8.1)

## 2019-04-24 LAB — HAPTOGLOBIN: Haptoglobin: 219 mg/dL (ref 38–329)

## 2019-04-24 MED ORDER — SODIUM CHLORIDE 0.9 % IV SOLN
1.0000 g | INTRAVENOUS | Status: DC
  Filled 2019-04-24: qty 10

## 2019-04-24 MED ORDER — MORPHINE SULFATE (PF) 2 MG/ML IV SOLN
1.0000 mg | INTRAVENOUS | Status: DC | PRN
  Administered 2019-04-24: 06:00:00 1 mg via INTRAVENOUS
  Filled 2019-04-24: qty 1

## 2019-04-24 MED ORDER — SODIUM CHLORIDE 0.9 % IV SOLN
2.0000 g | INTRAVENOUS | Status: DC
  Administered 2019-04-24 – 2019-04-25 (×2): 2 g via INTRAVENOUS
  Filled 2019-04-24: qty 2
  Filled 2019-04-24: qty 20
  Filled 2019-04-24: qty 2

## 2019-04-24 NOTE — Progress Notes (Signed)
Physical Therapy Treatment Patient Details Name: Randy JakschWilliam Richard Rosano MRN: [id number] DOB: 10-Aug-1934 Today's Date: 04/24/2019    History of Present Illness  85 y.o. male with a known history of Parkinson's, anxiety, clotting disorder, heart block, DVT, peripheral neuropathy, osteoarthritis, status post pacemaker-sent from the nursing home with complaint of increasing fatigue.  Admitted with UTI.    PT Comments    Pt with regression from yesterday's PT session. Patient unable to stand with TW following maxA from PT and max cuing for setup/hand placement, and ant hip translation. Patient with heavy posterior lean causing RW to tilt backward, making transfer unsafe and requiring multiple controlled lowering back to EOB. PT attempted STS trials with physical assist only to increase tactile feedback at post hips and safety which patient is unable to complete d/t posterior lean and weight on heels, and is unable to correct this dispute max attempts, LE blocking, and VC/TC. PT able to max +1 assist from EOB to BSC in squat pivot transfer d/t urgency, but patient is a true max +2 squat pivot for caregiver safety, which was utilized back to chair from [facility]. Patient is subsequently a max +2 for STS for cleaning d/t fatigue. Would benefit from skilled PT to address above deficits and promote optimal return to PLOF   Follow Up Recommendations        Equipment Recommendations       Recommendations for Other Services       Precautions / Restrictions Restrictions Weight Bearing Restrictions: No    Mobility  Bed Mobility Overal bed mobility: Needs Assistance Bed Mobility: Supine to Sit     Supine to sit: Mod assist     General bed mobility comments: Effort EOB with assit of b/l LE and significant assist with trunk as patient has strong posterior lean  Transfers Overall transfer level: Needs assistance Equipment used: Rolling walker (2 wheeled) Transfers: Sit to/from Wells FargoStand;Squat Pivot  Transfers Sit to Stand: Max assist;+2 physical assistance   Squat pivot transfers: +2 physical assistance;Max assist     General transfer comment: Attempts of STS with RW with little success d/t strong posterior lean. Attempted without RW with PT TC and VC patient for proper stand. PT able to maxA patient to [facility] +[facility] d/t urgency of needing to use [facility], called second therapist for safety for return transfer to chair. Max +2 STS for bottom cleaning  Ambulation/Gait                 Stairs             Wheelchair Mobility    Modified Rankin (Stroke Patients Only)       Balance Overall balance assessment: Needs assistance Sitting-balance support: Feet supported;Bilateral upper extremity supported Sitting balance-Leahy Scale: Fair Sitting balance - Comments: Pt hunched and leaning back, but once assisted to EOB he did mantain static sitting    Standing balance support: Bilateral upper extremity supported Standing balance-Leahy Scale: Zero                              Cognition                                              Exercises Other Exercises Other Exercises: Bed mobility: modA with patient able to put good effort through LE put ultimately required  assist. Patient with modA and cuing needed for turnknegotiation d/t heavy posterior lean. Once sitting EOB able to remain sitting with UE support, post lean continues able to be momentarily corrected by cuing Other Exercises: STS: attempted multiple timeswith RW with maxA and heavy cuing for setup with hand placement  and for anterior hip translation. Patient unable to comply with cuing and continues to lean back with RW tipping with hip and his weight on his heels, needing PT to control lower to sit. Attempted multiple trials without RW with +1 physical assist with consistent TC and VC for anterior lean without success. +2 max to stand with toilet cleaning Other Exercises: Squat pivot: max +2  BSC<>chair/bed d/t consistent posterior lean despite cuing.     General Comments        Pertinent Vitals/Pain Pain Assessment: No/denies pain    Home Living                      Prior Function            PT Goals (current goals can now be found in the care plan section) Acute Rehab PT Goals Patient Stated Goal: go back to facility PT Goal Formulation: With patient Time For Goal Achievement: 05/07/19 Potential to Achieve Goals: Fair Progress towards PT goals: Not progressing toward goals - comment(regression)    Frequency           PT Plan      Co-evaluation              AM-PAC PT "6 Clicks" Mobility   Outcome Measure                   End of Session               Time: 3159-4585 PT Time Calculation (min) (ACUTE ONLY): 38 min  Charges:  $Therapeutic Activity: 38-52 mins                     Staci Acosta PT, DPT   Staci Acosta 04/24/2019, 10:37 AM

## 2019-04-24 NOTE — Progress Notes (Signed)
Please note, patient is currently followed by outpatient Palliative at [facility] unit. CSW Ruthe Mannan made aware. Dayna Barker BSN, RN, [facility] [place] [phone]

## 2019-04-24 NOTE — Progress Notes (Signed)
   04/24/19 1900  Clinical Encounter Type  Visited With Patient  Visit Type Follow-up;Spiritual support  Referral From Family;Chaplain  Consult/Referral To Chaplain  Spiritual Encounters  Spiritual Needs Prayer;Emotional  Chaplain visit patient and he was lying in bed. Chaplain offered emotional support. Patient was sharing his thoughts with Chaplain. Patient thanked Orthoptist for visiting and Chaplain said a prayer of comfort and a peaceful rest.

## 2019-04-24 NOTE — Plan of Care (Signed)

## 2019-04-24 NOTE — Consult Note (Signed)
Randy [name] nAME: Randy [name]  DOB: 02/25/1934  MRN: [id number]  Date/Time: 04/24/2019 9:44 AM  REQUESTING PROVIDER: Dr. Nancy Marus Subjective:  REASON FOR CONSULT: E. coli bacteremia ?Patient is a poor historian.  Chart reviewed Randy [name] is a 85 y.o. male with a history of Parkinson's disease, gait abnormality, status post right hip arthroplasty, status post B/l total knee replacement, pacemaker, chronic DVT presented to the ED [facility] after staff noted right-sided facial droop and left arm weakness.  But when EMS arrived they did not find any weakness but did report foul odor of urine. In the ED he was found to have a temperature of 102, blood pressure of 134/68.  The culture and urine culture was sent and he was started on ceftriaxone which was then changed to meropenem when E. coli was reported in the  blood.  I am asked to see the patient for the same. Patient is sitting in the chair.  He knows he is in the hospital.  He is not sure why he is in the hospital   Medical history Parkinsonism Parkinson's disease Heart block DVT GERD Peripheral neuropathy Depression Mild cognitive impairment Spinal stenosis Hypertension  Past surgical history Hernia repair Vasectomy Cataract surgery Bilateral knee replacement Right hip replacement Left shoulder arthroscopy Correction of hammertoe Pacemaker Social history Lives at [facility] Former smoker  Family History  Problem Relation Age of Onset  . Hypertension Mother   . Heart disease Mother   . Hypertension Father   . Seizures Father        epilepsy  . Heart disease Father   . Prostate cancer Other   . Cancer Other   . Hypertension Other   . Asthma Other   . Hypertension Brother   . Breast cancer Daughter    Allergies  Allergen Reactions  . Citalopram     Other reaction(s): Unknown  . Codeine Nausea And Vomiting  . Sertraline     Other reaction(s): Unknown Other reaction(s): Unknown  . Lodine  [Etodolac] Rash  . Percocet [Oxycodone-Acetaminophen] Rash    Tolerates tylenol Patient is able to take this recently    ? Current Facility-Administered Medications  Medication Dose Route Frequency Provider Last Rate Last Dose  . 0.9 %  sodium chloride infusion   Intravenous PRN Altamese Dilling, MD 10 mL/hr at 04/23/19 2157 250 mL at 04/23/19 2157  . acetaminophen (TYLENOL) tablet 650 mg  650 mg Oral QID PRN Altamese Dilling, MD   650 mg at 04/22/19 1901  . carbidopa-levodopa (SINEMET IR) 25-250 MG per tablet immediate release 1 tablet  1 tablet Oral BID Altamese Dilling, MD   1 tablet at 04/23/19 2143  . cefTRIAXone (ROCEPHIN) 2 g in sodium chloride 0.9 % 100 mL IVPB  2 g Intravenous Q24H Eltha Tingley, MD      . docusate sodium (COLACE) capsule 100 mg  100 mg Oral BID PRN Altamese Dilling, MD      . fluticasone (FLONASE) 50 MCG/ACT nasal spray 1 spray  1 spray Each Nare Daily Altamese Dilling, MD   1 spray at 04/23/19 1024  . ipratropium (ATROVENT) 0.03 % nasal spray 2 spray  2 spray Each Nare Q12H Altamese Dilling, MD   2 spray at 04/24/19 0058  . ipratropium-albuterol (DUONEB) 0.5-2.5 (3) MG/3ML nebulizer solution 3 mL  3 mL Nebulization Q6H PRN Altamese Dilling, MD      . lisinopril (ZESTRIL) tablet 40 mg  40 mg Oral Daily Altamese Dilling, MD      .  MEDLINE mouth rinse  15 mL Mouth Rinse BID Mayo, Allyn Kenner, MD      . Melatonin TABS 5 mg  5 mg Oral QHS Altamese Dilling, MD   5 mg at 04/23/19 2143  . morphine 2 MG/ML injection 1-2 mg  1-2 mg Intravenous Q4H PRN Mansy, Jan A, MD   1 mg at 04/24/19 0538  . ondansetron (ZOFRAN) tablet 4 mg  4 mg Oral Q6H PRN Altamese Dilling, MD      . pantoprazole (PROTONIX) EC tablet 40 mg  40 mg Oral Daily Altamese Dilling, MD   40 mg at 04/23/19 1020  . Rivaroxaban (XARELTO) tablet 15 mg  15 mg Oral Q supper Altamese Dilling, MD   15 mg at 04/23/19 1734  . senna-docusate  (Senokot-S) tablet 2 tablet  2 tablet Oral BID Altamese Dilling, MD   2 tablet at 04/23/19 2143  . tamsulosin (FLOMAX) capsule 0.4 mg  0.4 mg Oral QPC supper Altamese Dilling, MD   0.4 mg at 04/23/19 1734     Abtx:  Anti-infectives (From admission, onward)   Start     Dose/Rate Route Frequency Ordered Stop   04/24/19 1000  cefTRIAXone (ROCEPHIN) 1 g in sodium chloride 0.9 % 100 mL IVPB  Status:  Discontinued     1 g 200 mL/hr over 30 Minutes Intravenous Every 24 hours 04/24/19 0938 04/24/19 0940   04/24/19 1000  cefTRIAXone (ROCEPHIN) 2 g in sodium chloride 0.9 % 100 mL IVPB     2 g 200 mL/hr over 30 Minutes Intravenous Every 24 hours 04/24/19 0940     04/23/19 1000  cefTRIAXone (ROCEPHIN) 1 g in sodium chloride 0.9 % 100 mL IVPB  Status:  Discontinued     1 g 200 mL/hr over 30 Minutes Intravenous Every 24 hours 04/22/19 1608 04/23/19 0337   04/23/19 0500  meropenem (MERREM) 1 g in sodium chloride 0.9 % 100 mL IVPB  Status:  Discontinued     1 g 200 mL/hr over 30 Minutes Intravenous Every 8 hours 04/23/19 0337 04/24/19 0938   04/22/19 1300  cefTRIAXone (ROCEPHIN) 1 g in sodium chloride 0.9 % 100 mL IVPB     1 g 200 mL/hr over 30 Minutes Intravenous  Once 04/22/19 1245 04/22/19 1324      REVIEW OF SYSTEMS:  Unable to get a clear review of system from the patient He denies fever, cough or shortness of breath He does say he may have urinary incontinence at nighttime Objective:  VITALS:  BP 139/68 (BP Location: Left Arm)   Pulse 79   Temp 98.6 F (37 C) (Oral)   Resp 18   Ht  (1.778 m)   Wt 81.5 kg   SpO2 99%   BMI 25.77 kg/m  PHYSICAL EXAM:  General: Sitting in chair, awake, slow to comprehend and  respond.  Knows he is in the hospital.  Responds to some questions appropriately.  .  Head: Tilted towards one side, drooling of saliva from the mouth  eyes: Conjunctivae clear, anicteric sclerae.  ENT Nares normal. No drainage or sinus tenderness. Opens his  mouth  cannot examine completely Neck: Supple, symmetrical, no adenopathy, thyroid: non tender no carotid bruit and no JVD. Back: No CVA tenderness. Lungs: B/l air entry Heart: irregular pacemake site okay. Abdomen: Soft,  Extremities:  Minimal ankle edema Skin: not examined completely Lymph: Cervical, supraclavicular normal. Neurologic: did not examine in detail Intentional tremors seen in the upper extremities Pertinent Labs Lab Results CBC  Component Value Date/Time   WBC 9.6 04/24/2019 0522   RBC 3.48 (L) 04/24/2019 0522   HGB 12.1 (L) 04/24/2019 0522   HGB 12.9 (L) 03/17/2015 1409   HCT 37.0 (L) 04/24/2019 0522   HCT 38.8 (L) 03/17/2015 1409   PLT 119 (L) 04/24/2019 0522   PLT 139 (L) 03/17/2015 1409   MCV 106.3 (H) 04/24/2019 0522   MCV 97 03/17/2015 1409   MCH 34.8 (H) 04/24/2019 0522   MCHC 32.7 04/24/2019 0522   RDW 13.2 04/24/2019 0522   RDW 13.5 03/17/2015 1409   LYMPHSABS 1.8 04/24/2019 0522   LYMPHSABS 1.4 03/17/2015 1409   MONOABS 0.5 04/24/2019 0522   MONOABS 0.4 03/17/2015 1409   EOSABS 0.1 04/24/2019 0522   EOSABS 0.4 03/17/2015 1409   BASOSABS 0.0 04/24/2019 0522   BASOSABS 0.1 03/17/2015 1409    CMP Latest Ref Rng & Units 04/24/2019 04/23/2019 04/22/2019  Glucose 70 - 99 mg/dL 536(R) 443(X) 540(G)  BUN 8 - 23 mg/dL 86(P) 61(P) 23  Creatinine 0.61 - 1.24 mg/dL 5.09 3.26 7.12  Sodium 135 - 145 mmol/L 141 139 140  Potassium 3.5 - 5.1 mmol/L 3.7 3.9 4.1  Chloride 98 - 111 mmol/L 107 104 105  CO2 22 - 32 mmol/L 25 25 26   Calcium 8.9 - 10.3 mg/dL 4.5(Y) 8.3(L) 8.8(L)  Total Protein 6.5 - 8.1 g/dL 6.1(L) - 6.3(L)  Total Bilirubin 0.3 - 1.2 mg/dL 0.9 0.9 2.0(H)  Alkaline Phos 38 - 126 U/L 52 - 61  AST 15 - 41 U/L 15 - 19  ALT 0 - 44 U/L 6 - 9      Microbiology: Recent Results (from the past 240 hour(s))  Urine Culture     Status: Abnormal (Preliminary result)   Collection Time: 04/22/19 11:15 AM  Result Value Ref Range Status   Specimen  Description   Final    URINE, CLEAN CATCH Performed at [facility], [address], [place], [place] [address]    Special Requests   Final    Normal Performed at [facility], [address], [place], [place] [address]    Culture (A)  Final    >=100,000 COLONIES/mL ESCHERICHIA COLI REPEATING SUSCEPTIBILITIES Performed at [facility], [address], [place], [place] [address]    Report Status PENDING  Incomplete   Organism ID, Bacteria ESCHERICHIA COLI (A)  Final      Susceptibility   Escherichia coli - MIC*    AMPICILLIN <=2 SENSITIVE Sensitive     CEFAZOLIN <=4 SENSITIVE Sensitive     CEFTRIAXONE <=1 SENSITIVE Sensitive     CIPROFLOXACIN 0.5 SENSITIVE Sensitive     GENTAMICIN <=1 SENSITIVE Sensitive     TRIMETH/SULFA <=20 SENSITIVE Sensitive     AMPICILLIN/SULBACTAM <=2 SENSITIVE Sensitive     PIP/TAZO <=4 SENSITIVE Sensitive     * >=100,000 COLONIES/mL ESCHERICHIA COLI  SARS Coronavirus 2 [facility] order, Performed in [facility] hospital lab)     Status: None   Collection Time: 04/22/19 12:55 PM  Result Value Ref Range Status   SARS Coronavirus 2 NEGATIVE NEGATIVE Final    Comment: (NOTE) If result is NEGATIVE SARS-CoV-2 target nucleic acids are NOT DETECTED. The SARS-CoV-2 RNA is generally detectable in upper and lower  respiratory specimens during the acute phase of infection. The lowest  concentration of SARS-CoV-2 viral copies this assay can detect is 250  copies / mL. A negative result does not preclude SARS-CoV-2 infection  and should not be used  as the sole basis for treatment or other  patient management decisions.  A negative result may occur with  improper specimen collection / handling, submission of specimen other  than nasopharyngeal swab, presence of viral mutation(s) within the  areas targeted by this assay, and inadequate number of viral copies  (<250 copies / mL). A negative result must be combined with clinical   observations, patient history, and epidemiological information. If result is POSITIVE SARS-CoV-2 target nucleic acids are DETECTED. The SARS-CoV-2 RNA is generally detectable in upper and lower  respiratory specimens dur ing the acute phase of infection.  Positive  results are indicative of active infection with SARS-CoV-2.  Clinical  correlation with patient history and other diagnostic information is  necessary to determine patient infection status.  Positive results do  not rule out bacterial infection or co-infection with other viruses. If result is PRESUMPTIVE POSTIVE SARS-CoV-2 nucleic acids MAY BE PRESENT.   A presumptive positive result was obtained on the submitted specimen  and confirmed on repeat testing.  While 2019 novel coronavirus  (SARS-CoV-2) nucleic acids may be present in the submitted sample  additional confirmatory testing may be necessary for epidemiological  and / or clinical management purposes  to differentiate between  SARS-CoV-2 and other Sarbecovirus currently known to infect humans.  If clinically indicated additional testing with an alternate test  methodology [phone]) is advised. The SARS-CoV-2 RNA is generally  detectable in upper and lower respiratory sp ecimens during the acute  phase of infection. The expected result is Negative. Fact Sheet for Patients:  BoilerBrush.com.cy Fact Sheet for Healthcare Providers: https://pope.com/ This test is not yet approved or cleared by the [place] FDA and has been authorized for detection and/or diagnosis of SARS-CoV-2 by FDA under an Emergency Use Authorization (EUA).  This EUA will remain in effect (meaning this test can be used) for the duration of the COVID-19 declaration under Section 564(b)(1) of the Act, 21 U.S.C. section 360bbb-3(b)(1), unless the authorization is terminated or revoked sooner. Performed at [facility], [address]  [place], [place] [address]   Culture, blood (Routine X 2) w Reflex to ID Panel     Status: Abnormal (Preliminary result)   Collection Time: 04/22/19 12:55 PM  Result Value Ref Range Status   Specimen Description   Final    BLOOD LEFT ANTECUBITAL Performed at [facility], [address] [place], [place] [address]    Special Requests   Final    BOTTLES DRAWN AEROBIC AND ANAEROBIC Blood Culture results may not be optimal due to an excessive volume of blood received in culture bottles Performed at [facility], [address], [place], [place] [address]    Culture  Setup Time   Final    GRAM NEGATIVE RODS IN BOTH AEROBIC AND ANAEROBIC BOTTLES CRITICAL RESULT CALLED TO, READ BACK BY AND VERIFIED WITH: [facility] HALLAJI AT 5621 ON 04/23/2019 JJB Performed at [facility], [address], [place], [place] [address]    Culture ESCHERICHIA COLI (A)  Final   Report Status PENDING  Incomplete  Blood Culture ID Panel (Reflexed)     Status: Abnormal   Collection Time: 04/22/19 12:55 PM  Result Value Ref Range Status   Enterococcus species NOT DETECTED NOT DETECTED Final   Listeria monocytogenes NOT DETECTED NOT DETECTED Final   Staphylococcus species NOT DETECTED NOT DETECTED Final   Staphylococcus aureus (BCID) NOT DETECTED NOT DETECTED Final   Streptococcus species NOT DETECTED NOT DETECTED Final  Streptococcus agalactiae NOT DETECTED NOT DETECTED Final   Streptococcus pneumoniae NOT DETECTED NOT DETECTED Final   Streptococcus pyogenes NOT DETECTED NOT DETECTED Final   Acinetobacter baumannii NOT DETECTED NOT DETECTED Final   Enterobacteriaceae species DETECTED (A) NOT DETECTED Final    Comment: Enterobacteriaceae represent a large family of gram-negative bacteria, not a single organism. CRITICAL RESULT CALLED TO, READ BACK BY AND VERIFIED WITH: SHEEMA HALLAJI AT 0307 ON 04/23/2019 JJB    Enterobacter cloacae complex NOT DETECTED NOT DETECTED Final   Escherichia coli  DETECTED (A) NOT DETECTED Final    Comment: CRITICAL RESULT CALLED TO, READ BACK BY AND VERIFIED WITH: SHEEMA HALLAJI AT 0307 ON 04/23/2019 JJB    Klebsiella oxytoca NOT DETECTED NOT DETECTED Final   Klebsiella pneumoniae NOT DETECTED NOT DETECTED Final   Proteus species NOT DETECTED NOT DETECTED Final   Serratia marcescens NOT DETECTED NOT DETECTED Final   Carbapenem resistance NOT DETECTED NOT DETECTED Final   Haemophilus influenzae NOT DETECTED NOT DETECTED Final   Neisseria meningitidis NOT DETECTED NOT DETECTED Final   Pseudomonas aeruginosa NOT DETECTED NOT DETECTED Final   Candida albicans NOT DETECTED NOT DETECTED Final   Candida glabrata NOT DETECTED NOT DETECTED Final   Candida krusei NOT DETECTED NOT DETECTED Final   Candida parapsilosis NOT DETECTED NOT DETECTED Final   Candida tropicalis NOT DETECTED NOT DETECTED Final    Comment: Performed at [facility], [address] [place], [place] [address]  Culture, blood (Routine X 2) w Reflex to ID Panel     Status: None (Preliminary result)   Collection Time: 04/22/19 12:56 PM  Result Value Ref Range Status   Specimen Description BLOOD BLOOD LEFT HAND  Final   Special Requests   Final    BOTTLES DRAWN AEROBIC AND ANAEROBIC Blood Culture results may not be optimal due to an excessive volume of blood received in culture bottles   Culture   Final    NO GROWTH 2 DAYS Performed at [facility], [address] [place], [place] [address]    Report Status PENDING  Incomplete  MRSA PCR Screening     Status: None   Collection Time: 04/22/19  6:22 PM  Result Value Ref Range Status   MRSA by PCR NEGATIVE NEGATIVE Final    Comment:        The GeneXpert MRSA Assay (FDA approved for NASAL specimens only), is one component of a comprehensive MRSA colonization surveillance program. It is not intended to diagnose MRSA infection nor to guide or monitor treatment for MRSA infections. Performed at [facility], [address] [place], [place] [address]     IMAGING RESULTS:  I have personally reviewed the films No infiltrate ? Impression/Recommendation 85 y.o. male with a history of Parkinson's disease, gait abnormality, status post right hip arthroplasty, status post B/l total knee replacement, pacemaker, chronic DVT presented to the ED [facility] after staff noted right-sided facial droop and left arm weakness.  But when EMS arrived they did not find any weakness but did report foul odor of urine.  E.coli bacteremia with e.coli UTI:  r/o incomplete bladder emptying- need post void bladder scan and also ultrasound to look for any hydronephrosis and bladder wall and prostate As urine E.coli is highly sensitive expect the blood to be the same and hence change meropenem to ceftriaxone Will give Iv antibiotic for a 3-5 days before switching to oral-  ? BPH on tamsulosin   ?Parkinson's disease on carbidopa/levodopa ?  H/o DVT on Xarelto   ___________________________________________________ Discussed the management with the requesting provider  Note:  This document was prepared using Dragon voice recognition software and may include unintentional dictation errors.

## 2019-04-24 NOTE — Progress Notes (Signed)
   04/24/19 1100  Clinical Encounter Type  Visited With Patient  Visit Type Follow-up;Spiritual support  Referral From Family;Chaplain  Consult/Referral To Chaplain  Spiritual Encounters  Spiritual Needs Emotional  Stress Factors  Family Stress Factors Other (Comment)  Chaplain visit patient to check on him. He was sitting in chair and Chaplain allowed patient to talk with her. Nurse came in to give patient medicine and patient's wife called and talk to patient. Chaplain will follow up later.

## 2019-04-24 NOTE — Progress Notes (Addendum)
[employer] [facility] [place] at [facility]   PATIENT NAME: Randy QuillWilliam [name]    [name]#:  [id number]  DATE OF BIRTH:  07/17/1934  SUBJECTIVE:   Patient states he is feeling little bit better this morning.  He still endorses some weakness.  He denies any chest pain, abdominal pain, shortness of breath.  He denies any issues with urination.  He denies any dysuria or urinary urgency.  Per RN, patient has urinary incontinence at baseline.  REVIEW OF SYSTEMS:  Review of Systems  Constitutional: Negative for chills, fever, malaise/fatigue and weight loss.  HENT: Positive for hearing loss. Negative for congestion and sore throat.   Eyes: Negative for blurred vision and double vision.  Respiratory: Negative for cough, shortness of breath and wheezing.   Cardiovascular: Negative for chest pain, palpitations, orthopnea and leg swelling.  Gastrointestinal: Negative for abdominal pain, diarrhea, nausea and vomiting.  Genitourinary: Positive for frequency and urgency. Negative for dysuria.  Musculoskeletal: Positive for back pain and joint pain. Negative for myalgias.  Skin: Negative for rash.  Neurological: Positive for tremors. Negative for dizziness, sensory change, speech change, focal weakness and headaches.  Psychiatric/Behavioral: Negative for depression.   DRUG ALLERGIES:   Allergies  Allergen Reactions   Citalopram     Other reaction(s): Unknown   Codeine Nausea And Vomiting   Sertraline     Other reaction(s): Unknown Other reaction(s): Unknown   Lodine [Etodolac] Rash   Percocet [Oxycodone-Acetaminophen] Rash    Tolerates tylenol Patient is able to take this recently   VITALS:  Blood pressure 139/68, pulse 79, temperature 98.6 F (37 C), temperature source Oral, resp. rate 18, height 5\' 10"  (1.778 m), weight 81.5 kg, SpO2 99 %. PHYSICAL EXAMINATION:   GENERAL:  85 y.o.-year-old patient lying in the bed with no acute distress.  EYES: Pupils equal, round, reactive  to light and accommodation. No scleral icterus. Extraocular muscles intact.  HEENT: Head atraumatic, normocephalic. Oropharynx and nasopharynx clear.  NECK:  Supple, no jugular venous distention. No thyroid enlargement, no tenderness.  LUNGS: Normal breath sounds bilaterally, no wheezing, rales,rhonchi or crepitation. No use of accessory muscles of respiration.  CARDIOVASCULAR: RRR, S1, S2 normal. No murmurs, rubs, or gallops.  ABDOMEN: Soft, nontender, nondistended. Bowel sounds present. No organomegaly or mass.  EXTREMITIES: No pedal edema, cyanosis, or clubbing.  NEUROLOGIC: Cranial nerves II through XII are intact. +global weakness. Sensation intact. Gait not checked.  PSYCHIATRIC: The patient is alert and oriented x 3.  SKIN: No obvious rash, lesion, or ulcer.   DATA REVIEWED:  LABORATORY PANEL:  Male CBC Recent Labs  Lab 04/24/19 0522  WBC 9.6  HGB 12.1*  HCT 37.0*  PLT 119*   ------------------------------------------------------------------------------------------------------------------ Chemistries  Recent Labs  Lab 04/24/19 0522  NA 141  K 3.7  CL 107  CO2 25  GLUCOSE 103*  BUN 30*  CREATININE 1.18  CALCIUM 8.6*  AST 15  ALT 6  ALKPHOS 52  BILITOT 0.9   RADIOLOGY:  [place] Abdomen Limited Ruq  Result Date: 04/23/2019 CLINICAL DATA:  Hyperbilirubinemia. EXAM: ULTRASOUND ABDOMEN LIMITED RIGHT UPPER QUADRANT COMPARISON:  CT chest, abdomen and pelvis 10/16/2014. FINDINGS: Gallbladder: No gallstones or wall thickening visualized. No sonographic Murphy sign noted by sonographer. Common bile duct: Diameter: 0.4 cm Liver: The left hepatic lobe is obscured by overlying bowel gas. No focal lesion identified. Within normal limits in parenchymal echogenicity. Portal vein is patent on color Doppler imaging with normal direction of blood flow towards the liver. IMPRESSION: Negative  for biliary ductal dilatation. No abnormality is identified. The left hepatic lobe is obscured by  overlying bowel gas. Electronically Signed   By: Drusilla Kanner M.D.   On: 04/23/2019 19:37   ASSESSMENT AND PLAN:   E. Coli bacteremia and UTI- no signs of sepsis this admission -Urine cultures growing >100,000 CFU pansensitive E. Coli  -Blood cultures growing E. Coli, awaiting susceptibilities -Change from meropenem to ceftriaxone -Check PVR and renal US to rule out urinary obstruction as a cause of his UTI -ID consulted  Metabolic encephalopathy- improving. Likely due to above. -CT head was negative for acute intracranial abnormality -Monitor  History of DVT -Continue home xarelto  Hypertension - Continue lisinopril  Parkinson's disease -Continue carbidopa levodopa -PT recommending home health, but patient will return to SNF on discharge  Hyperbilirubinemia- resolved. -RUQ [place] negative   Chronic thrombocytopenia- platelets close to baseline -Monitor  Macrocytic anemia- hemoglobin stable and at baseline. -Anemia panel unremarkable  DVT prophylaxis- xarelto  Plan for likely discharge to SNF tomorrow.  All the records are reviewed and case discussed with Care Management/Social Worker. Management plans discussed with the patient, family and they are in agreement.  CODE STATUS: DNR  TOTAL TIME TAKING CARE OF THIS PATIENT: 35 minutes.   More than 50% of the time was spent in counseling/coordination of care: YES  POSSIBLE D/C tomorrow, DEPENDING ON CLINICAL CONDITION.  Between 7am to 6pm - Pager - [phone]  After 6pm go to [email or web address] - Scientist, research (life sciences) [facility]  Office  [phone]  CC: Primary care physician; Lauro Regulus, MD  Note: This dictation was prepared with Dragon dictation along with smaller phrase technology. Any transcriptional errors that result from this process are unintentional.

## 2019-04-25 ENCOUNTER — Encounter: Payer: Medicare Other | Admitting: *Deleted

## 2019-04-25 LAB — BASIC METABOLIC PANEL
Anion gap: 8 (ref 5–15)
BUN: 27 mg/dL — ABNORMAL HIGH (ref 8–23)
CO2: 26 mmol/L (ref 22–32)
Calcium: 8.3 mg/dL — ABNORMAL LOW (ref 8.9–10.3)
Chloride: 107 mmol/L (ref 98–111)
Creatinine, Ser: 0.95 mg/dL (ref 0.61–1.24)
GFR calc Af Amer: >60 mL/min (ref >60)
GFR calc non Af Amer: >60 mL/min (ref >60)
Glucose, Bld: 110 mg/dL — ABNORMAL HIGH (ref 70–99)
Potassium: 3.5 mmol/L (ref 3.5–5.1)
Sodium: 141 mmol/L (ref 135–145)

## 2019-04-25 LAB — CBC
HCT: 34.8 % — ABNORMAL LOW (ref 39.0–52.0)
Hemoglobin: 11.4 g/dL — ABNORMAL LOW (ref 13.0–17.0)
MCH: 34 pg (ref 26.0–34.0)
MCHC: 32.8 g/dL (ref 30.0–36.0)
MCV: 103.9 fL — ABNORMAL HIGH (ref 80.0–100.0)
Platelets: 130 10*3/uL — ABNORMAL LOW (ref 150–400)
RBC: 3.35 MIL/uL — ABNORMAL LOW (ref 4.22–5.81)
RDW: 12.9 % (ref 11.5–15.5)
WBC: 5.5 10*3/uL (ref 4.0–10.5)
nRBC: 0 % (ref 0.0–0.2)

## 2019-04-25 LAB — CULTURE, BLOOD (ROUTINE X 2)

## 2019-04-25 LAB — URINE CULTURE
Culture: >=100000 — AB
Special Requests: NORMAL

## 2019-04-25 MED ORDER — HYDROCODONE-ACETAMINOPHEN 5-325 MG PO TABS: 1.0000 | ORAL_TABLET | Freq: Two times a day (BID) | ORAL | 0 refills | Status: DC | PRN

## 2019-04-25 MED ORDER — AMOXICILLIN-POT CLAVULANATE 875-125 MG PO TABS: 1.0000 | ORAL_TABLET | Freq: Two times a day (BID) | ORAL | 0 refills | Status: AC

## 2019-04-25 MED ORDER — LORAZEPAM 0.5 MG PO TABS: 0.5000 mg | ORAL_TABLET | Freq: Every day | ORAL | 0 refills | Status: DC

## 2019-04-25 NOTE — Discharge Summary (Signed)
[employer] - [place] at [facility]   PATIENT NAME: Randy [name]    MR#:  [id number]  DATE OF BIRTH:  June 27, 1934  DATE OF ADMISSION:  04/22/2019   ADMITTING PHYSICIAN: Altamese Dilling, MD  DATE OF DISCHARGE:  04/25/19  PRIMARY CARE PHYSICIAN: Lauro Regulus, MD   ADMISSION DIAGNOSIS:   Disorientation [R41.0] Lower urinary tract infection, acute [N39.0]  DISCHARGE DIAGNOSIS:   Principal Problem:   Acute lower UTI Active Problems:   UTI (urinary tract infection)   Pressure injury of skin   SECONDARY DIAGNOSIS:   Past Medical History:  Diagnosis Date   Anxiety    Arthritis    Blood transfusion    hx of autologus transfusion   Cataract cortical, senile    Chicken pox    Clotting disorder (HCC)    Complete heart block (HCC)    Depression    unspecified   DVT (deep venous thrombosis) (HCC) 09/2014   left leg   GERD (gastroesophageal reflux disease)    Hereditary and idiopathic neuropathy    History of depression    Hypertension    MCI (mild cognitive impairment) 10/22/2013   Memory deficit 03/24/2011   Memory loss    Movement disorder    Neuromuscular disorder (HCC)    peripheral  neuropathy   Neuropathy    Osteoarthritis    Other hammer toe(s) (acquired), unspecified foot    Pacemaker    Parkinson's disease (HCC)    Parkinsonism (HCC) 10/22/2013   Shortness of breath    Spinal stenosis    Synovitis and tenosynovitis, unspecified    Third degree heart block (HCC)    Tremor     HOSPITAL COURSE:   85 y.o. male with multiple medical history including DVT on anticoagulation, depression and anxiety, hypertension, heart block status post pacemaker, idiopathic neuropathy, spinal stenosis and GERD presenting with increased fatigue, confusion.  1.  E. coli bacteremia and UTI-no sepsis on admission -Blood and urine cultures growing E. coli.  Pansensitive, received Rocephin in the hospital. -ID has  been consulted.  Possible concern for retaining residual urine due to benign prostatic hypertrophy. -Post void residual noted to be around 200 mL.  High risk for recurrent UTIs. -Urology consultation recommended as outpatient -In and out catheterizations as needed at the nursing home.  Patient is confused at baseline, unable to do self catheterizations. -Will be discharged on 7 more days of Augmentin per ID recommendations  2.  Parkinson's disease-continue Sinemet at home  3.  History of DVT-continue Xarelto  4.  Hypertension-on lisinopril  5.  Acute metabolic encephalopathy-improving.  CT of the head is negative for any acute findings.  Patient will be discharged back to [facility] today. -will continue to follow-up with palliative services after discharge   DISCHARGE CONDITIONS:   Guarded  CONSULTS OBTAINED:   Infectious disease consultation by Dr. Rivka Safer  DRUG ALLERGIES:   Allergies  Allergen Reactions   Citalopram     Other reaction(s): Unknown   Codeine Nausea And Vomiting   Sertraline     Other reaction(s): Unknown Other reaction(s): Unknown   Lodine [Etodolac] Rash   Percocet [Oxycodone-Acetaminophen] Rash    Tolerates tylenol Patient is able to take this recently   DISCHARGE MEDICATIONS:   Allergies as of 04/25/2019      Reactions   Citalopram    Other reaction(s): Unknown   Codeine Nausea And Vomiting   Sertraline    Other reaction(s): Unknown Other reaction(s): Unknown  Lodine [etodolac] Rash   Percocet [oxycodone-acetaminophen] Rash   Tolerates tylenol Patient is able to take this recently      Medication List    TAKE these medications   acetaminophen 325 MG tablet Commonly known as:  TYLENOL Take 650 mg by mouth 4 (four) times daily as needed for fever. Max of 3000 mg of Apap in 24 hrs   alum & mag hydroxide-simeth 400-400-40 MG/5ML suspension Commonly known as:  MAALOX PLUS Take 30 mLs by mouth every 4 (four)  hours as needed for indigestion.   amoxicillin-clavulanate 875-125 MG tablet Commonly known as:  Augmentin Take 1 tablet by mouth every 12 (twelve) hours for 7 days. Start taking on:  Apr 26, 2019   Biscolax 10 MG suppository Generic drug:  bisacodyl Place 10 mg rectally 2 (two) times daily as needed for moderate constipation. Give until BM.   carbidopa-levodopa 25-250 MG tablet Commonly known as:  SINEMET IR Take 1 tablet by mouth 2 (two) times daily for 30 days.   cetirizine 10 MG tablet Commonly known as:  ZYRTEC Take 10 mg by mouth at bedtime.   desvenlafaxine 50 MG 24 hr tablet Commonly known as:  PRISTIQ Take 1 tablet (50 mg total) by mouth daily.   fluticasone 50 MCG/ACT nasal spray Commonly known as:  Flonase Place 1 spray into both nostrils daily.   HYDROcodone-acetaminophen 5-325 MG tablet Commonly known as:  NORCO/VICODIN Take 1 tablet by mouth every 12 (twelve) hours as needed for moderate pain.   ipratropium 0.03 % nasal spray Commonly known as:  ATROVENT Place 2 sprays into both nostrils every 12 (twelve) hours.   ipratropium-albuterol 0.5-2.5 (3) MG/3ML Soln Commonly known as:  DUONEB Take 3 mLs by nebulization every 6 (six) hours as needed (wheezing and shortness of breath with cough).   lisinopril 40 MG tablet Commonly known as:  ZESTRIL TAKE ONE TABLET BY MOUTH EVERY DAY   LORazepam 0.5 MG tablet Commonly known as:  ATIVAN Take 1 tablet (0.5 mg total) by mouth daily. Hold for sedation   magnesium hydroxide 400 MG/5ML suspension Commonly known as:  MILK OF MAGNESIA Take 30 mLs by mouth every 4 (four) hours as needed. If constipation/ no BM for 2 days   Melatonin 3 MG Tabs Take 1 tablet by mouth at bedtime. Should take 1 - 2 hours before sleep   ondansetron 4 MG tablet Commonly known as:  ZOFRAN Take 4 mg by mouth every 6 (six) hours as needed for nausea or vomiting.   pantoprazole 40 MG tablet Commonly known as:  PROTONIX Take 40 mg by  mouth daily.   Rivaroxaban 15 MG Tabs tablet Commonly known as:  XARELTO Take 15 mg by mouth daily with supper. for anticoagulation. Monitor for bleeding/bruising.   sennosides-docusate sodium 8.6-50 MG tablet Commonly known as:  SENOKOT-S Take 2 tablets by mouth 2 (two) times daily.   sodium phosphate enema Place 1 enema rectally every three (3) days as needed. If constipation not relieved by milk of magnesia or bisacodyl suppository. Follow package directions   sorbitol 70 % solution Take 30 mLs by mouth every 2 (two) hours as needed. Constipation. Give PO Q 2 hours until pt has large BM. Give first dose now along with Bisacodyl suppository   tamsulosin 0.4 MG Caps capsule Commonly known as:  FLOMAX Take 0.4 mg by mouth daily after supper. 30 mins after supper   zinc oxide 20 % ointment Apply 1 application topically as needed for irritation. Apply liberal  amount to areas of skin irritation as needed        DISCHARGE INSTRUCTIONS:   1. PCP f/u in 1 week 2. Urology f/u in 2 weeks  DIET:   Cardiac diet  ACTIVITY:   Activity as tolerated  OXYGEN:   Home Oxygen: No.  Oxygen Delivery: room air  DISCHARGE LOCATION:   nursing home   If you experience worsening of your admission symptoms, develop shortness of breath, life threatening emergency, suicidal or homicidal thoughts you must seek medical attention immediately by calling 911 or calling your MD immediately  if symptoms less severe.  You Must read complete instructions/literature along with all the possible adverse reactions/side effects for all the Medicines you take and that have been prescribed to you. Take any new Medicines after you have completely understood and accpet all the possible adverse reactions/side effects.   Please note  You were cared for by a hospitalist during your hospital stay. If you have any questions about your discharge medications or the care you received while you were in the hospital  after you are discharged, you can call the unit and asked to speak with the hospitalist on call if the hospitalist that took care of you is not available. Once you are discharged, your primary care physician will handle any further medical issues. Please note that NO REFILLS for any discharge medications will be authorized once you are discharged, as it is imperative that you return to your primary care physician (or establish a relationship with a primary care physician if you do not have one) for your aftercare needs so that they can reassess your need for medications and monitor your lab values.    On the day of Discharge:  VITAL SIGNS:   Blood pressure 140/80, pulse 72, temperature 98.9 F (37.2 C), resp. rate 18, height  (1.778 m), weight 81.5 kg, SpO2 98 %.  PHYSICAL EXAMINATION:    GENERAL:  85 y.o.-year-old patient lying in the bed with no acute distress.  EYES: Pupils equal, round, reactive to light and accommodation. No scleral icterus. Extraocular muscles intact.  HEENT: Head atraumatic, normocephalic. Oropharynx and nasopharynx clear.  NECK:  Supple, no jugular venous distention. No thyroid enlargement, no tenderness.  LUNGS: Normal breath sounds bilaterally, no wheezing, rales,rhonchi or crepitation. No use of accessory muscles of respiration. Decreased bibasilar breath sounds CARDIOVASCULAR: S1, S2 normal. No  rubs, or gallops. 2/6 systolic murmur present ABDOMEN: Soft, non-tender, non-distended. Bowel sounds present. No organomegaly or mass.  EXTREMITIES: No pedal edema, cyanosis, or clubbing.  NEUROLOGIC: Cranial nerves II through XII are intact. Muscle strength equal in all extremities- much weaker in lower extremities. Global weakness noted.. Sensation intact. Gait not checked.  PSYCHIATRIC: The patient is alert and oriented to self  SKIN: No obvious rash, lesion, or ulcer.   DATA REVIEW:   CBC Recent Labs  Lab 04/25/19 0459  WBC 5.5  HGB 11.4*  HCT 34.8*    PLT 130*    Chemistries  Recent Labs  Lab 04/24/19 0522 04/25/19 0459  NA 141 141  K 3.7 3.5  CL 107 107  CO2 25 26  GLUCOSE 103* 110*  BUN 30* 27*  CREATININE 1.18 0.95  CALCIUM 8.6* 8.3*  AST 15  --   ALT 6  --   ALKPHOS 52  --   BILITOT 0.9  --      Microbiology Results  Results for orders placed or performed during the hospital encounter of 04/22/19  Urine Culture  Status: Abnormal   Collection Time: 04/22/19 11:15 AM  Result Value Ref Range Status   Specimen Description   Final    URINE, CLEAN CATCH Performed at [facility], [address] [place], [place] [address]    Special Requests   Final    Normal Performed at [facility], [address] [place], [place] [address]    Culture >=100,000 COLONIES/mL ESCHERICHIA COLI (A)  Final   Report Status 04/25/2019 FINAL  Final   Organism ID, Bacteria ESCHERICHIA COLI (A)  Final      Susceptibility   Escherichia coli - MIC*    AMPICILLIN <=2 SENSITIVE Sensitive     CEFAZOLIN <=4 SENSITIVE Sensitive     CEFTRIAXONE <=1 SENSITIVE Sensitive     CIPROFLOXACIN 0.5 SENSITIVE Sensitive     GENTAMICIN <=1 SENSITIVE Sensitive     TRIMETH/SULFA <=20 SENSITIVE Sensitive     AMPICILLIN/SULBACTAM <=2 SENSITIVE Sensitive     PIP/TAZO <=4 SENSITIVE Sensitive     CEFEPIME SENSITIVE Sensitive     CEFTAZIDIME SENSITIVE Sensitive     IMIPENEM SENSITIVE Sensitive     NITROFURANTOIN SENSITIVE Sensitive     * >=100,000 COLONIES/mL ESCHERICHIA COLI  SARS Coronavirus 2 [facility] order, Performed in [facility] hospital lab)     Status: None   Collection Time: 04/22/19 12:55 PM  Result Value Ref Range Status   SARS Coronavirus 2 NEGATIVE NEGATIVE Final    Comment: (NOTE) If result is NEGATIVE SARS-CoV-2 target nucleic acids are NOT DETECTED. The SARS-CoV-2 RNA is generally detectable in upper and lower  respiratory specimens during the acute phase of infection. The lowest  concentration of SARS-CoV-2  viral copies this assay can detect is 250  copies / mL. A negative result does not preclude SARS-CoV-2 infection  and should not be used as the sole basis for treatment or other  patient management decisions.  A negative result may occur with  improper specimen collection / handling, submission of specimen other  than nasopharyngeal swab, presence of viral mutation(s) within the  areas targeted by this assay, and inadequate number of viral copies  (<250 copies / mL). A negative result must be combined with clinical  observations, patient history, and epidemiological information. If result is POSITIVE SARS-CoV-2 target nucleic acids are DETECTED. The SARS-CoV-2 RNA is generally detectable in upper and lower  respiratory specimens dur ing the acute phase of infection.  Positive  results are indicative of active infection with SARS-CoV-2.  Clinical  correlation with patient history and other diagnostic information is  necessary to determine patient infection status.  Positive results do  not rule out bacterial infection or co-infection with other viruses. If result is PRESUMPTIVE POSTIVE SARS-CoV-2 nucleic acids MAY BE PRESENT.   A presumptive positive result was obtained on the submitted specimen  and confirmed on repeat testing.  While 2019 novel coronavirus  (SARS-CoV-2) nucleic acids may be present in the submitted sample  additional confirmatory testing may be necessary for epidemiological  and / or clinical management purposes  to differentiate between  SARS-CoV-2 and other Sarbecovirus currently known to infect humans.  If clinically indicated additional testing with an alternate test  methodology [phone]) is advised. The SARS-CoV-2 RNA is generally  detectable in upper and lower respiratory sp ecimens during the acute  phase of infection. The expected result is Negative. Fact Sheet for Patients:  BoilerBrush.com.cy Fact Sheet for Healthcare  Providers: https://pope.com/ This test is not yet approved or cleared by the [place] FDA  and has been authorized for detection and/or diagnosis of SARS-CoV-2 by FDA under an Emergency Use Authorization (EUA).  This EUA will remain in effect (meaning this test can be used) for the duration of the COVID-19 declaration under Section 564(b)(1) of the Act, 21 U.S.C. section 360bbb-3(b)(1), unless the authorization is terminated or revoked sooner. Performed at [facility], [address] [place], [place] [address]   Culture, blood (Routine X 2) w Reflex to ID Panel     Status: Abnormal   Collection Time: 04/22/19 12:55 PM  Result Value Ref Range Status   Specimen Description   Final    BLOOD LEFT ANTECUBITAL Performed at [facility], [address] [place], [place] [address]    Special Requests   Final    BOTTLES DRAWN AEROBIC AND ANAEROBIC Blood Culture results may not be optimal due to an excessive volume of blood received in culture bottles Performed at [facility], [address], [place], [place] [address]    Culture  Setup Time   Final    GRAM NEGATIVE RODS IN BOTH AEROBIC AND ANAEROBIC BOTTLES CRITICAL RESULT CALLED TO, READ BACK BY AND VERIFIED WITH: [facility] HALLAJI AT 9147 ON 04/23/2019 JJB Performed at [facility], [address] [place], [place] [address]    Culture ESCHERICHIA COLI (A)  Final   Report Status 04/25/2019 FINAL  Final   Organism ID, Bacteria ESCHERICHIA COLI  Final      Susceptibility   Escherichia coli - MIC*    AMPICILLIN <=2 SENSITIVE Sensitive     CEFAZOLIN <=4 SENSITIVE Sensitive     CEFEPIME <=1 SENSITIVE Sensitive     CEFTAZIDIME <=1 SENSITIVE Sensitive     CEFTRIAXONE <=1 SENSITIVE Sensitive     CIPROFLOXACIN <=0.25 SENSITIVE Sensitive     GENTAMICIN <=1 SENSITIVE Sensitive     IMIPENEM <=0.25 SENSITIVE Sensitive     TRIMETH/SULFA <=20 SENSITIVE Sensitive      AMPICILLIN/SULBACTAM <=2 SENSITIVE Sensitive     PIP/TAZO <=4 SENSITIVE Sensitive     Extended ESBL NEGATIVE Sensitive     * ESCHERICHIA COLI  Blood Culture ID Panel (Reflexed)     Status: Abnormal   Collection Time: 04/22/19 12:55 PM  Result Value Ref Range Status   Enterococcus species NOT DETECTED NOT DETECTED Final   Listeria monocytogenes NOT DETECTED NOT DETECTED Final   Staphylococcus species NOT DETECTED NOT DETECTED Final   Staphylococcus aureus (BCID) NOT DETECTED NOT DETECTED Final   Streptococcus species NOT DETECTED NOT DETECTED Final   Streptococcus agalactiae NOT DETECTED NOT DETECTED Final   Streptococcus pneumoniae NOT DETECTED NOT DETECTED Final   Streptococcus pyogenes NOT DETECTED NOT DETECTED Final   Acinetobacter baumannii NOT DETECTED NOT DETECTED Final   Enterobacteriaceae species DETECTED (A) NOT DETECTED Final    Comment: Enterobacteriaceae represent a large family of gram-negative bacteria, not a single organism. CRITICAL RESULT CALLED TO, READ BACK BY AND VERIFIED WITH: SHEEMA HALLAJI AT 0307 ON 04/23/2019 JJB    Enterobacter cloacae complex NOT DETECTED NOT DETECTED Final   Escherichia coli DETECTED (A) NOT DETECTED Final    Comment: CRITICAL RESULT CALLED TO, READ BACK BY AND VERIFIED WITH: SHEEMA HALLAJI AT 0307 ON 04/23/2019 JJB    Klebsiella oxytoca NOT DETECTED NOT DETECTED Final   Klebsiella pneumoniae NOT DETECTED NOT DETECTED Final   Proteus species NOT DETECTED NOT DETECTED Final   Serratia marcescens NOT DETECTED NOT DETECTED Final   Carbapenem resistance NOT DETECTED NOT DETECTED Final  Haemophilus influenzae NOT DETECTED NOT DETECTED Final   Neisseria meningitidis NOT DETECTED NOT DETECTED Final   Pseudomonas aeruginosa NOT DETECTED NOT DETECTED Final   Candida albicans NOT DETECTED NOT DETECTED Final   Candida glabrata NOT DETECTED NOT DETECTED Final   Candida krusei NOT DETECTED NOT DETECTED Final   Candida parapsilosis NOT DETECTED NOT  DETECTED Final   Candida tropicalis NOT DETECTED NOT DETECTED Final    Comment: Performed at [facility], [address] [place], [place] [address]  Culture, blood (Routine X 2) w Reflex to ID Panel     Status: None (Preliminary result)   Collection Time: 04/22/19 12:56 PM  Result Value Ref Range Status   Specimen Description BLOOD BLOOD LEFT HAND  Final   Special Requests   Final    BOTTLES DRAWN AEROBIC AND ANAEROBIC Blood Culture results may not be optimal due to an excessive volume of blood received in culture bottles   Culture   Final    NO GROWTH 3 DAYS Performed at [facility], [address] [place], [place] [address]    Report Status PENDING  Incomplete  MRSA PCR Screening     Status: None   Collection Time: 04/22/19  6:22 PM  Result Value Ref Range Status   MRSA by PCR NEGATIVE NEGATIVE Final    Comment:        The GeneXpert MRSA Assay (FDA approved for NASAL specimens only), is one component of a comprehensive MRSA colonization surveillance program. It is not intended to diagnose MRSA infection nor to guide or monitor treatment for MRSA infections. Performed at [facility], [address] [place], [place] [address]     RADIOLOGY:  [place] Renal  Result Date: 04/24/2019 CLINICAL DATA:  UTI EXAM: RENAL / URINARY TRACT ULTRASOUND COMPLETE COMPARISON:  CT abdomen pelvis 10/16/2014 FINDINGS: Right Kidney: Renal measurements: 11.2 x 6.0 x 5.2 cm = volume: 193 mL . Echogenicity within normal limits. No mass or hydronephrosis visualized. Left Kidney: Renal measurements: 12.0 x 4.9 x 6.2 cm = volume: 192 mL. 3.7 cm left lower pole cyst. Echogenicity within normal limits. No mass or hydronephrosis visualized. Bladder: Normal bladder.  Enlarged prostate measuring 5.4 x 4.2 x 5.0 cm. IMPRESSION: Enlarged prostate. Negative for hydronephrosis. Left lower pole renal cyst. Electronically Signed   By: Marlan Palauharles  Clark M.D.   On: 04/24/2019 18:33      Management plans discussed with the patient, family and they are in agreement.  CODE STATUS:     Code Status Orders  (From admission, onward)         Start     Ordered   04/22/19 1628  Do not attempt resuscitation (DNR)  Continuous    Question Answer Comment  In the event of cardiac or respiratory ARREST Do not call a code blue   In the event of cardiac or respiratory ARREST Do not perform Intubation, CPR, defibrillation or ACLS   In the event of cardiac or respiratory ARREST Use medication by any route, position, wound care, and other measures to relive pain and suffering. May use oxygen, suction and manual treatment of airway obstruction as needed for comfort.      04/22/19 1627        Code Status History    This patient has a current code status but no historical code status.    Advance Directive Documentation     Most Recent Value  Type of Advance Directive  Out of facility DNR (pink  MOST or yellow form)  Pre-existing out of facility DNR order (yellow form or pink MOST form)  Yellow form placed in chart (order not valid for inpatient use)  "MOST" Form in Place?  --      TOTAL TIME TAKING CARE OF THIS PATIENT: 38 minutes.    Enid Baas M.D on 04/25/2019 at 10:30 AM  Between 7am to 6pm - Pager - [phone]  After 6pm go to [email or web address] - Scientist, research (life sciences) [facility]  Office  [phone]  CC: Primary care physician; Lauro Regulus, MD   Note: This dictation was prepared with Dragon dictation along with smaller phrase technology. Any transcriptional errors that result from this process are unintentional.

## 2019-04-25 NOTE — Progress Notes (Signed)
Patient voided 100 ml into urinal, post void bladder scan showed 198 ml left in bladder

## 2019-04-25 NOTE — Care Management Important Message (Signed)
Important Message  Patient Details  Name: Randy [name] MRN: [id number] Date of Birth: Oct 06, 1934   Medicare Important Message Given:  Yes    Randy [name] 04/25/2019, 10:51 AM

## 2019-04-25 NOTE — Progress Notes (Signed)
Discharge instructions in packet. Report given to [facility]. IV removed. Patient in stable condition. EMS called for transport.

## 2019-04-25 NOTE — TOC Transition Note (Signed)
Transition of Care [facility]) - CM/SW Discharge Note   Patient Details  Name: Randy [name] MRN: [id number] Date of Birth: 1933/12/31  [facility] CM/SW Contact:  Ruthe Mannan, LCSWA Phone Number: 04/25/2019, 11:09 AM   Clinical Narrative:   Patient is medically ready for discharge today back to [facility]. CSW notified patient's wife Randy [name] of discharge today. Wife in agreement. CSW also notified Randy [name] at [place] of discharge today. Patient will be transported by EMS. RN to call report and call for transport.     Final next level of care: Skilled Nursing Facility Barriers to Discharge: No Barriers Identified   Patient Goals and CMS Choice Patient states their goals for this hospitalization and ongoing recovery are:: return to SNF  CMS Medicare.gov Compare Post Acute Care list provided to:: Patient Represenative (must comment)(wife ) Choice offered to / list presented to : Spouse  Discharge Placement   Existing PASRR number confirmed : 04/23/19          Patient chooses bed at: [facility] Patient to be transferred to facility by: EMS Name of family member notified: Randy [name]- wife  Patient and family notified of of transfer: 04/25/19  Discharge Plan and Services     Post Acute Care Choice: Skilled Nursing Facility                               Social Determinants of Health (SDOH) Interventions     Readmission Risk Interventions No flowsheet data found.

## 2019-04-26 ENCOUNTER — Telehealth: Payer: Self-pay

## 2019-04-26 NOTE — Telephone Encounter (Signed)
Left message for patient to remind of missed remote transmission.  

## 2019-04-27 LAB — CULTURE, BLOOD (ROUTINE X 2): Culture: NO GROWTH

## 2019-04-29 ENCOUNTER — Encounter: Payer: Self-pay | Admitting: Adult Health

## 2019-04-29 ENCOUNTER — Non-Acute Institutional Stay (SKILLED_NURSING_FACILITY): Payer: Medicare Other | Admitting: Adult Health

## 2019-04-29 ENCOUNTER — Inpatient Hospital Stay: Payer: Medicare Other | Admitting: Oncology

## 2019-04-29 ENCOUNTER — Inpatient Hospital Stay: Payer: Medicare Other

## 2019-04-29 DIAGNOSIS — N400 Enlarged prostate without lower urinary tract symptoms: Secondary | ICD-10-CM

## 2019-04-29 DIAGNOSIS — J3089 Other allergic rhinitis: Secondary | ICD-10-CM

## 2019-04-29 DIAGNOSIS — I10 Essential (primary) hypertension: Secondary | ICD-10-CM

## 2019-04-29 DIAGNOSIS — N390 Urinary tract infection, site not specified: Secondary | ICD-10-CM | POA: Diagnosis not present

## 2019-04-29 DIAGNOSIS — G8929 Other chronic pain: Secondary | ICD-10-CM

## 2019-04-29 DIAGNOSIS — Z86718 Personal history of other venous thrombosis and embolism: Secondary | ICD-10-CM | POA: Diagnosis not present

## 2019-04-29 DIAGNOSIS — F419 Anxiety disorder, unspecified: Secondary | ICD-10-CM

## 2019-04-29 DIAGNOSIS — G20 Parkinson's disease: Secondary | ICD-10-CM

## 2019-04-29 DIAGNOSIS — F339 Major depressive disorder, recurrent, unspecified: Secondary | ICD-10-CM

## 2019-04-29 DIAGNOSIS — G4709 Other insomnia: Secondary | ICD-10-CM

## 2019-04-29 NOTE — Progress Notes (Signed)
[facility]:  [facility] Room Number: 336-P Place of Service:  SNF (31) Provider:  Kenard Gower, DNP, FNP-BC  Patient Care Team: Lauro Regulus, MD as PCP - General (Internal Medicine) Sharee Holster, NP as Nurse Practitioner (Geriatric Medicine)  Extended Emergency Contact Information Primary Emergency Contact: Marva Panda [place] of [place] Home Phone: [phone] Mobile Phone: [phone] Relation: Spouse Secondary Emergency Contact: Gwynneth Munson [place] of [place] Mobile Phone: [phone] Relation: Son  Code Status:  DNR  Goals of care: Advanced Directive information Advanced Directives 04/29/2019  Does Patient Have a Medical Advance Directive? Yes  Type of Advance Directive Out of facility DNR (pink MOST or yellow form)  Does patient want to make changes to medical advance directive? No - Patient declined  Copy of Healthcare Power of Attorney in Chart? -  Would patient like information on creating a medical advance directive? -  Pre-existing out of facility DNR order (yellow form or pink MOST form) Yellow form placed in chart (order not valid for inpatient use)     Chief Complaint  Patient presents with  . Medical Management of Chronic Issues    Routine TVAB visit    HPI:  Pt is a 85 y.o. male seen today for medical management of chronic diseases.  He has PMH of anxiety, arthritis, DVT, hypertension and depression. He was recently re-admitted to TVAB from being hospitalized for E. Coli bacteremia. He was started on Rocephin and ID was consulted. There was a possible concern for retaining residual urine due to BPH, post void residual around 200 ml.  He complains 50. In and out catheterization was ordered as needed and was discharged on Augmentin.   Past Medical History:  Diagnosis Date  . Anxiety   . Arthritis   . Blood transfusion    hx of autologus transfusion  . Cataract cortical, senile   .  Chicken pox   . Clotting disorder (HCC)   . Complete heart block (HCC)   . Depression    unspecified  . DVT (deep venous thrombosis) (HCC) 09/2014   left leg  . GERD (gastroesophageal reflux disease)   . Hereditary and idiopathic neuropathy   . History of depression   . Hypertension   . MCI (mild cognitive impairment) 10/22/2013  . Memory deficit 03/24/2011  . Memory loss   . Movement disorder   . Neuromuscular disorder (HCC)    peripheral  neuropathy  . Neuropathy   . Osteoarthritis   . Other hammer toe(s) (acquired), unspecified foot   . Pacemaker   . Parkinson's disease (HCC)   . Parkinsonism (HCC) 10/22/2013  . Shortness of breath   . Spinal stenosis   . Synovitis and tenosynovitis, unspecified   . Third degree heart block (HCC)   . Tremor    Past Surgical History:  Procedure Laterality Date  . CATARACT EXTRACTION  06/2005  . COLONOSCOPY  06/13/2006   PH colon polyps (MUS) repeat 7/20  . CORRECTION HAMMER TOE    . defibrilator  04/04/2012   Dual Chamber  . INSERT / REPLACE / REMOVE PACEMAKER  04/04/2012  . JOINT REPLACEMENT Right    THA  . JOINT REPLACEMENT Bilateral    TKA  . KNEE ARTHROSCOPY Bilateral    Right x 2 and Left  . KNEE SURGERY  [phone]  . LEAD REVISION N/A 04/05/2012   Procedure: LEAD REVISION;  Surgeon: Duke Salvia, MD;  Location: [facility] CATH LAB;  Service: Cardiovascular;  Laterality:  N/A;  . PERMANENT PACEMAKER INSERTION N/A 04/04/2012   Procedure: PERMANENT PACEMAKER INSERTION;  Surgeon: Marinus Maw, MD;  Location: [facility] CATH LAB;  Service: Cardiovascular;  Laterality: N/A;  . REPLACEMENT TOTAL HIP W/  RESURFACING IMPLANTS    . REPLACEMENT TOTAL KNEE BILATERAL  1997  . ROTATOR CUFF REPAIR  06/2008  . ROTATOR CUFF REPAIR Left 2010  . SHOULDER ARTHROSCOPY Left 06/19/2008   WITH SUBACROMIAL DECOMPRESSION. DISTAL CLAVICLE EXCISION. ARTHROSCOPIC ROTATOR CUFF REPAIR.  Marland Kitchen TOTAL HIP ARTHROPLASTY  2009   right  . UMBILICAL HERNIA REPAIR  2001  .  VASECTOMY      Allergies  Allergen Reactions  . Citalopram     Other reaction(s): Unknown  . Codeine Nausea And Vomiting  . Sertraline     Other reaction(s): Unknown Other reaction(s): Unknown  . Lodine [Etodolac] Rash  . Percocet [Oxycodone-Acetaminophen] Rash    Tolerates tylenol Patient is able to take this recently    Outpatient Encounter Medications as of 04/29/2019  Medication Sig  . acetaminophen (TYLENOL) 325 MG tablet Take 650 mg by mouth 4 (four) times daily as needed for fever. Max of 3000 mg of Apap in 24 hrs  . alum & mag hydroxide-simeth (MAALOX PLUS) 400-400-40 MG/5ML suspension Take 30 mLs by mouth every 4 (four) hours as needed for indigestion.  Marland Kitchen amoxicillin-clavulanate (AUGMENTIN) 875-125 MG tablet Take 1 tablet by mouth every 12 (twelve) hours for 7 days.  . bisacodyl (BISCOLAX) 10 MG suppository Place 10 mg rectally 2 (two) times daily as needed for moderate constipation. Give until BM.  . carbidopa-levodopa (SINEMET IR) 25-250 MG tablet Take 1 tablet by mouth 2 (two) times daily for 30 days.  . cetirizine (ZYRTEC) 10 MG tablet Take 10 mg by mouth at bedtime.  Marland Kitchen desvenlafaxine (PRISTIQ) 50 MG 24 hr tablet Take 1 tablet (50 mg total) by mouth daily.  . fluticasone (FLONASE) 50 MCG/ACT nasal spray Place 1 spray into both nostrils daily.  Marland Kitchen HYDROcodone-acetaminophen (NORCO/VICODIN) 5-325 MG tablet Take 1 tablet by mouth every 12 (twelve) hours as needed for moderate pain.  Marland Kitchen ipratropium (ATROVENT) 0.03 % nasal spray Place 2 sprays into both nostrils every 12 (twelve) hours.  Marland Kitchen ipratropium-albuterol (DUONEB) 0.5-2.5 (3) MG/3ML SOLN Take 3 mLs by nebulization every 6 (six) hours as needed (wheezing and shortness of breath with cough).  Marland Kitchen lisinopril (PRINIVIL,ZESTRIL) 40 MG tablet TAKE ONE TABLET BY MOUTH EVERY DAY  . LORazepam (ATIVAN) 0.5 MG tablet Take 1 tablet (0.5 mg total) by mouth daily. Hold for sedation  . magnesium hydroxide (MILK OF MAGNESIA) 400 MG/5ML  suspension Take 30 mLs by mouth every 4 (four) hours as needed. If constipation/ no BM for 2 days  . Melatonin 3 MG TABS Take 1 tablet by mouth at bedtime. Should take 1 - 2 hours before sleep  . ondansetron (ZOFRAN) 4 MG tablet Take 4 mg by mouth every 6 (six) hours as needed for nausea or vomiting.  . OXYGEN Inhale 2 L into the lungs as needed (Dyspnea).  . pantoprazole (PROTONIX) 40 MG tablet Take 40 mg by mouth daily.  . Rivaroxaban (XARELTO) 15 MG TABS tablet Take 15 mg by mouth daily with supper. for anticoagulation. Monitor for bleeding/bruising.  . sennosides-docusate sodium (SENOKOT-S) 8.6-50 MG tablet Take 2 tablets by mouth 2 (two) times daily.   . sodium phosphate (FLEET) enema Place 1 enema rectally every three (3) days as needed. If constipation not relieved by milk of magnesia or bisacodyl suppository. Follow package  directions  . sorbitol 70 % solution Take 30 mLs by mouth every 2 (two) hours as needed. Constipation. Give PO Q 2 hours until pt has large BM. Give first dose now along with Bisacodyl suppository  . tamsulosin (FLOMAX) 0.4 MG CAPS capsule Take 0.4 mg by mouth daily after supper. 30 mins after supper  . zinc oxide 20 % ointment Apply 1 application topically as needed for irritation. Apply liberal amount to areas of skin irritation as needed   No facility-administered encounter medications on file as of 04/29/2019.     Review of Systems  GENERAL: No change in appetite, no fatigue, no fever, chills or weakness NOSE: Denies nasal congestion or epistaxis MOUTH and THROAT: Denies oral discomfort, gingival pain or bleeding RESPIRATORY: no cough, SOB, DOE, wheezing, hemoptysis CARDIAC: No chest pain, edema or palpitations GI: No abdominal pain, diarrhea, constipation, heart burn, nausea or vomiting GU: Denies dysuria, frequency, hematuria, incontinence, or discharge NEUROLOGICAL: Denies dizziness, syncope, numbness, or headache PSYCHIATRIC: Denies feelings of depression  or anxiety. No report of hallucinations, insomnia, paranoia, or agitation    Immunization History  Administered Date(s) Administered  . Influenza-Unspecified 10/20/2014, 09/10/2016, 09/02/2017, 10/12/2018  . PPD Test 12/05/2015, 11/26/2016, 11/11/2018  . Pneumococcal Polysaccharide-23 03/13/2014  . Pneumococcal-Unspecified 10/20/2014   Pertinent  Health Maintenance Due  Topic Date Due  . PNA vac Low Risk Adult (2 of 2 - PCV13) 10/21/2015  . INFLUENZA VACCINE  07/13/2019   Fall Risk  10/30/2018  Falls in the past year? 0     Vitals:   04/29/19 1443  BP: (!) 145/89  Pulse: 75  Resp: 16  Temp: 98.1 F (36.7 C)  TempSrc: Oral  SpO2: 98%  Weight: 175 lb 4.8 oz (79.5 kg)  Height: 5\' 10"  (1.778 m)   Body mass index is 25.15 kg/m.  Physical Exam  GENERAL APPEARANCE: Well nourished. In no acute distress. Normal body habitus SKIN:  Skin is warm and dry.  MOUTH and THROAT: Lips are without lesions. Oral mucosa is moist and without lesions. RESPIRATORY: Breathing is even & unlabored, BS CTAB CARDIAC: RRR, no murmur,no extra heart sounds, BLE trace edema, left chest pacemaker GI: Abdomen soft, normal BS, no masses, no tenderness NEUROLOGICAL: Speech is clear. Alert and oriented X 3. PSYCHIATRIC:  Affect and behavior are appropriate   Labs reviewed: Recent Labs    04/23/19 0459 04/24/19 0522 04/25/19 0459  NA 139 141 141  K 3.9 3.7 3.5  CL 104 107 107  CO2 25 25 26   GLUCOSE 123* 103* 110*  BUN 27* 30* 27*  CREATININE 1.04 1.18 0.95  CALCIUM 8.3* 8.6* 8.3*   Recent Labs    12/27/18 0620 04/22/19 0951 04/23/19 1013 04/24/19 0522  AST 14* 19  --  15  ALT 8 9  --  6  ALKPHOS 64 61  --  52  BILITOT 0.9 2.0* 0.9 0.9  PROT 6.2* 6.3*  --  6.1*  ALBUMIN 3.9 4.0  --  3.4*   Recent Labs    12/27/18 0620 04/22/19 0951 04/23/19 0459 04/24/19 0522 04/25/19 0459  WBC 5.9 11.8* 11.9* 9.6 5.5  NEUTROABS 2.6 9.3*  --  7.2  --   HGB 12.8* 12.7* 11.6* 12.1* 11.4*   HCT 40.4 38.6* 35.4* 37.0* 34.8*  MCV 107.4* 105.2* 104.4* 106.3* 103.9*  PLT 137* 121* 111* 119* 130*   Lab Results  Component Value Date   TSH 1.231 09/13/2018     Significant Diagnostic Results in last 30 days:  Dg Chest 2 View  Result Date: 04/22/2019 CLINICAL DATA:  Right sided facial droop and left arm weakness. EXAM: CHEST - 2 VIEW COMPARISON:  10/15/2014 FINDINGS: Chronic dual lead pacemaker. Chronic cardiomegaly. Chronic aortic atherosclerosis. Slight worsening of bilateral lower lobe atelectasis. Patchy basilar pneumonia not excluded. Upper lobes remain clear. IMPRESSION: Slight worsening of patchy density at both lung bases that could be simple atelectasis or mild bibasilar pneumonia. Electronically Signed   By: Paulina Fusi M.D.   On: 04/22/2019 10:40   Ct Head Wo Contrast  Result Date: 04/22/2019 CLINICAL DATA:  RIGHT-sided facial droop and LEFT arm weakness. History of Parkinson's disease. Patient is anticoagulated. EXAM: CT HEAD WITHOUT CONTRAST TECHNIQUE: Contiguous axial images were obtained from the base of the skull through the vertex without intravenous contrast. COMPARISON:  CT head 11/14/2016. FINDINGS: Brain: No evidence of acute infarction, hemorrhage, hydrocephalus, extra-axial collection or mass lesion/mass effect. Generalized atrophy. Hypoattenuation of white matter, likely small vessel disease. Vascular: Calcification of the cavernous internal carotid arteries consistent with cerebrovascular atherosclerotic disease. No signs of intracranial large vessel occlusion. Skull: Calvarium intact. Sinuses/Orbits: No acute finding. Other: Compared with priors, similar appearance. IMPRESSION: Atrophy and small vessel disease. No acute intracranial findings. Electronically Signed   By: Elsie Stain M.D.   On: 04/22/2019 10:28   US Renal  Result Date: 04/24/2019 CLINICAL DATA:  UTI EXAM: RENAL / URINARY TRACT ULTRASOUND COMPLETE COMPARISON:  CT abdomen pelvis 10/16/2014  FINDINGS: Right Kidney: Renal measurements: 11.2 x 6.0 x 5.2 cm = volume: 193 mL . Echogenicity within normal limits. No mass or hydronephrosis visualized. Left Kidney: Renal measurements: 12.0 x 4.9 x 6.2 cm = volume: 192 mL. 3.7 cm left lower pole cyst. Echogenicity within normal limits. No mass or hydronephrosis visualized. Bladder: Normal bladder.  Enlarged prostate measuring 5.4 x 4.2 x 5.0 cm. IMPRESSION: Enlarged prostate. Negative for hydronephrosis. Left lower pole renal cyst. Electronically Signed   By: Marlan Palau M.D.   On: 04/24/2019 18:33   US Abdomen Limited Ruq  Result Date: 04/23/2019 CLINICAL DATA:  Hyperbilirubinemia. EXAM: ULTRASOUND ABDOMEN LIMITED RIGHT UPPER QUADRANT COMPARISON:  CT chest, abdomen and pelvis 10/16/2014. FINDINGS: Gallbladder: No gallstones or wall thickening visualized. No sonographic Murphy sign noted by sonographer. Common bile duct: Diameter: 0.4 cm Liver: The left hepatic lobe is obscured by overlying bowel gas. No focal lesion identified. Within normal limits in parenchymal echogenicity. Portal vein is patent on color Doppler imaging with normal direction of blood flow towards the liver. IMPRESSION: Negative for biliary ductal dilatation. No abnormality is identified. The left hepatic lobe is obscured by overlying bowel gas. Electronically Signed   By: Drusilla Kanner M.D.   On: 04/23/2019 19:37    Assessment/Plan  1. Urinary tract infection without hematuria, site unspecified -Blood and urine cultures grew E. coli, was started on Rocephin and discharged on Augmentin, follow- up with urology  2. Benign prostatic hyperplasia, unspecified whether lower urinary tract symptoms present -Continue tamsulosin ER 0.4 mg 1 capsule daily, follow-up with urology  3. Essential hypertension -Well-controlled, continue lisinopril 40 mg 1 tab daily  4. History of DVT of lower extremity -Continue Xarelto 15 mg 1 tab daily  5. Allergic rhinitis due to other allergic  trigger, unspecified seasonality -Stable, continue city disease 10 mg 1 tab at bedtime, fluticasone 50 mcg/ACT 1 spray to each nostril daily  6. Major depression, recurrent, chronic (HCC) -Stable, continue Pristiq ER 50 mg 1 tab daily  7. Parkinson's disease (HCC) -Continue carbidopa-levodopa 25-250  mg 1 tab twice a day  8. Other insomnia -Continue melatonin 3 mg 1 tab at bedtime  9.  Anxiety -Mood is stable, continue Ativan 0.5 mg 1 tab daily  10.  Chronic pain - has chronic pain on his knees, lower back and neck, will continue Norco 5-325 mg 1 tab every 12 hours as needed   Family/ staff Communication: Discussed plan of care with resident.  Labs/tests ordered: None  Goals of care:   Long-term care   Kenard Gower, DNP, FNP-BC [facility] [phone] (Monday-Friday 8:00 a.m. - 5:00 p.m.) [phone] (after hours)

## 2019-05-01 ENCOUNTER — Encounter: Payer: Self-pay | Admitting: Cardiology

## 2019-05-13 ENCOUNTER — Other Ambulatory Visit: Payer: Self-pay | Admitting: Adult Health

## 2019-05-13 ENCOUNTER — Encounter
Admission: RE | Admit: 2019-05-13 | Discharge: 2019-05-13 | Disposition: A | Discharge status: Home / Self Care | Payer: Medicare Other | Source: Ambulatory Visit | Attending: Internal Medicine | Admitting: Internal Medicine

## 2019-05-13 ENCOUNTER — Other Ambulatory Visit: Payer: Self-pay

## 2019-05-13 MED ORDER — HYDROCODONE-ACETAMINOPHEN 5-325 MG PO TABS: 1.0000 | ORAL_TABLET | Freq: Two times a day (BID) | ORAL | 0 refills | Status: AC | PRN

## 2019-05-13 MED ORDER — HYDROCODONE-ACETAMINOPHEN 5-325 MG PO TABS: 1.0000 | ORAL_TABLET | Freq: Two times a day (BID) | ORAL | 0 refills | Status: DC | PRN

## 2019-05-13 MED ORDER — LORAZEPAM 0.5 MG PO TABS: 0.5000 mg | ORAL_TABLET | Freq: Every day | ORAL | 0 refills | Status: AC

## 2019-05-13 MED ORDER — LORAZEPAM 0.5 MG PO TABS: 0.5000 mg | ORAL_TABLET | Freq: Every day | ORAL | 0 refills | Status: DC

## 2019-05-15 ENCOUNTER — Other Ambulatory Visit: Payer: Self-pay

## 2019-05-15 ENCOUNTER — Non-Acute Institutional Stay: Payer: Medicare Other | Admitting: Student

## 2019-05-15 VITALS — BP 140/90 | HR 72 | Resp 20

## 2019-05-15 DIAGNOSIS — Z515 Encounter for palliative care: Secondary | ICD-10-CM

## 2019-05-15 NOTE — Progress Notes (Signed)
Therapist, nutritional Palliative Care Consult Note Telephone: [phone]  Fax: [phone]  PATIENT NAME: Randy [name] DOB: June 23, 1934 MRN: [id number]  PRIMARY CARE PROVIDER:   Lauro Regulus, MD  REFERRING PROVIDER:  Lauro Regulus, MD [address] [facility] [place], [place] [address]  RESPONSIBLE PARTY: Wife, Randy [name] [phone]   ASSESSMENT: Patient is sitting up to recliner; he had just finished eating lunch. NP explains reason for visit. We discussed ongoing goals of care. Discussed with wife via phone. We discussed ongoing goals of care and symptom management. Randy [name] states that patient will sometimes call her 8 times in a day, still hallucinates. She discussed his sinemet being discontinued as she was told by two different neurologist in the past that he does not have parkinson's and does not feel that the medication has helped. She is given opportunity to ask questions. She is appreciative of update. She is encouraged to call as needs or questions arise. Palliative Medicine will continue to follow and provide support.       RECOMMENDATIONS and PLAN:  1. Code status: DNR. 2. Medical goals of therapy: continue physical therapy as directed. Palliative Medicine will make recommendations as needed.   3. Symptom management: Pain-continue hydrocodone-acetaminophen 5-325mg  every 12 hours prn. 4. Discharge Planning: Randy [name] will continue to reside at [facility] [place] unit.  I spent 15 minutes providing this consultation,  from 12:30pm to 12:45pm. More than 50% of the time in this consultation was spent coordinating communication.   HISTORY OF PRESENT ILLNESS:  Randy [name] is a 85 y.o.  male with multiple medical problems including dementia, major depressive disorder, spinal stenosis, radiculopathy, essential hypertension, Parkinson's disease vs. Parkinsonian symptoms, peripheral  neuropathy, anxiety, right total hip arthroplasty, left shoulder arthroplasty,left rotator cuff repair, total knee replacement, hammertoes correction, cataract extraction, vasectomy. Palliative Care was asked to help address goals of care; Randy [name] is seen for follow up visit today. He states he has been doing okay since returning from hospital. He is receiving physical therapy. He states he walked with therapy today. He requires assistance with adl's. He states that his pain has been managed well. He denies shortness of breath. 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 Facility cna states that patient has been drooling more; patient states he has noticed it at times. Staff reports confusion and hallucinations at times. New orders received today to discontinue sinemet per neurologist. He reports sleeping well at night. No recent falls reported.   CODE STATUS: DNR  PPS: 40% HOSPICE ELIGIBILITY/DIAGNOSIS: TBD  PAST MEDICAL HISTORY:  Past Medical History:  Diagnosis Date   Anxiety    Arthritis    Blood transfusion    hx of autologus transfusion   Cataract cortical, senile    Chicken pox    Clotting disorder (HCC)    Complete heart block (HCC)    Depression    unspecified   DVT (deep venous thrombosis) (HCC) 09/2014   left leg   GERD (gastroesophageal reflux disease)    Hereditary and idiopathic neuropathy    History of depression    Hypertension    MCI (mild cognitive impairment) 10/22/2013   Memory deficit 03/24/2011   Memory loss    Movement disorder    Neuromuscular disorder (HCC)    peripheral  neuropathy   Neuropathy    Osteoarthritis    Other hammer toe(s) (acquired), unspecified foot    Pacemaker  Parkinson's disease (HCC)    Parkinsonism (HCC) 10/22/2013   Shortness of breath    Spinal stenosis    Synovitis and tenosynovitis, unspecified    Third degree heart block (HCC)    Tremor     SOCIAL HX:  Social History    Tobacco Use   Smoking status: Former Smoker    Packs/day: 1.00    Years: 25.00    Pack years: 25.00    Types: Cigarettes    Last attempt to quit: 06/24/1973    Years since quitting: 45.9   Smokeless tobacco: Never Used  Substance Use Topics   Alcohol use: Not Currently    Comment: occasional    ALLERGIES:  Allergies  Allergen Reactions   Citalopram     Other reaction(s): Unknown   Codeine Nausea And Vomiting   Sertraline     Other reaction(s): Unknown Other reaction(s): Unknown   Lodine [Etodolac] Rash   Percocet [Oxycodone-Acetaminophen] Rash    Tolerates tylenol Patient is able to take this recently     PERTINENT MEDICATIONS:  Outpatient Encounter Medications as of 05/15/2019  Medication Sig   acetaminophen (TYLENOL) 325 MG tablet Take 650 mg by mouth 4 (four) times daily as needed for fever. Max of 3000 mg of Apap in 24 hrs   alum & mag hydroxide-simeth (MAALOX PLUS) 400-400-40 MG/5ML suspension Take 30 mLs by mouth every 4 (four) hours as needed for indigestion.   bisacodyl (BISCOLAX) 10 MG suppository Place 10 mg rectally 2 (two) times daily as needed for moderate constipation. Give until BM.   carbidopa-levodopa (SINEMET IR) 25-250 MG tablet Take 1 tablet by mouth 2 (two) times daily for 30 days.   cetirizine (ZYRTEC) 10 MG tablet Take 10 mg by mouth at bedtime.   desvenlafaxine (PRISTIQ) 50 MG 24 hr tablet Take 1 tablet (50 mg total) by mouth daily.   fluticasone (FLONASE) 50 MCG/ACT nasal spray Place 1 spray into both nostrils daily.   HYDROcodone-acetaminophen (NORCO/VICODIN) 5-325 MG tablet Take 1 tablet by mouth every 12 (twelve) hours as needed for moderate pain.   ipratropium (ATROVENT) 0.03 % nasal spray Place 2 sprays into both nostrils every 12 (twelve) hours.   ipratropium-albuterol (DUONEB) 0.5-2.5 (3) MG/3ML SOLN Take 3 mLs by nebulization every 6 (six) hours as needed (wheezing and shortness of breath with cough).   lisinopril  (PRINIVIL,ZESTRIL) 40 MG tablet TAKE ONE TABLET BY MOUTH EVERY DAY   LORazepam (ATIVAN) 0.5 MG tablet Take 1 tablet (0.5 mg total) by mouth daily. Hold for sedation   magnesium hydroxide (MILK OF MAGNESIA) 400 MG/5ML suspension Take 30 mLs by mouth every 4 (four) hours as needed. If constipation/ no BM for 2 days   Melatonin 3 MG TABS Take 1 tablet by mouth at bedtime. Should take 1 - 2 hours before sleep   ondansetron (ZOFRAN) 4 MG tablet Take 4 mg by mouth every 6 (six) hours as needed for nausea or vomiting.   OXYGEN Inhale 2 L into the lungs as needed (Dyspnea).   pantoprazole (PROTONIX) 40 MG tablet Take 40 mg by mouth daily.   Rivaroxaban (XARELTO) 15 MG TABS tablet Take 15 mg by mouth daily with supper. for anticoagulation. Monitor for bleeding/bruising.   sennosides-docusate sodium (SENOKOT-S) 8.6-50 MG tablet Take 2 tablets by mouth 2 (two) times daily.    sodium phosphate (FLEET) enema Place 1 enema rectally every three (3) days as needed. If constipation not relieved by milk of magnesia or bisacodyl suppository. Follow package directions  sorbitol 70 % solution Take 30 mLs by mouth every 2 (two) hours as needed. Constipation. Give PO Q 2 hours until pt has large BM. Give first dose now along with Bisacodyl suppository   tamsulosin (FLOMAX) 0.4 MG CAPS capsule Take 0.4 mg by mouth daily after supper. 30 mins after supper   zinc oxide 20 % ointment Apply 1 application topically as needed for irritation. Apply liberal amount to areas of skin irritation as needed   No facility-administered encounter medications on file as of 05/15/2019.     PHYSICAL EXAM:   General: NAD Cardiovascular: regular rate and rhythm Pulmonary: clear ant fields Abdomen: soft, nontender, + bowel sounds GU: no suprapubic tenderness Extremities: no edema, no joint deformities Skin: no rashes Neurological: Weakness but otherwise nonfocal  Luella Cook, NP

## 2019-05-27 ENCOUNTER — Encounter: Payer: Self-pay | Admitting: Adult Health

## 2019-05-27 ENCOUNTER — Non-Acute Institutional Stay (SKILLED_NURSING_FACILITY): Payer: Medicare Other | Admitting: Adult Health

## 2019-05-27 DIAGNOSIS — G20 Parkinson's disease: Secondary | ICD-10-CM

## 2019-05-27 DIAGNOSIS — N400 Enlarged prostate without lower urinary tract symptoms: Secondary | ICD-10-CM | POA: Diagnosis not present

## 2019-05-27 DIAGNOSIS — F419 Anxiety disorder, unspecified: Secondary | ICD-10-CM

## 2019-05-27 DIAGNOSIS — I10 Essential (primary) hypertension: Secondary | ICD-10-CM | POA: Diagnosis not present

## 2019-05-27 DIAGNOSIS — I825Y2 Chronic embolism and thrombosis of unspecified deep veins of left proximal lower extremity: Secondary | ICD-10-CM

## 2019-05-27 DIAGNOSIS — J309 Allergic rhinitis, unspecified: Secondary | ICD-10-CM | POA: Diagnosis not present

## 2019-05-27 DIAGNOSIS — G8929 Other chronic pain: Secondary | ICD-10-CM

## 2019-05-27 DIAGNOSIS — G20A1 Parkinson's disease without dyskinesia, without mention of fluctuations: Secondary | ICD-10-CM

## 2019-05-27 DIAGNOSIS — F339 Major depressive disorder, recurrent, unspecified: Secondary | ICD-10-CM

## 2019-05-27 NOTE — Progress Notes (Signed)
Location:  [facility] Room Number: 336 Place of Service:  SNF [phone]) Provider:  Kenard GowerMedina-Vargas, Monina, DNP, FNP-BC  Patient Care Team: Lauro RegulusAnderson, Marshall W, MD as PCP - General (Internal Medicine) Sharee HolsterGreen, Deborah S, NP as Nurse Practitioner (Geriatric Medicine)  Extended Emergency Contact Information Primary Emergency Contact: Marva PandaGilliam,Polly  [place] Home Phone: [phone] Mobile Phone: [phone] Relation: Spouse Secondary Emergency Contact: Gwynneth MunsonGilliam,Jeffery  [place] Mobile Phone: [phone] Relation: Son  Code Status:  DNR  Goals of care: Advanced Directive information Advanced Directives 05/27/2019  Does Patient Have a Medical Advance Directive? Yes  Type of Advance Directive Out of facility DNR (pink MOST or yellow form)  Does patient want to make changes to medical advance directive? No - Patient declined  Copy of Healthcare Power of Attorney in Chart? -  Would patient like information on creating a medical advance directive? -  Pre-existing out of facility DNR order (yellow form or pink MOST form) Yellow form placed in chart (order not valid for inpatient use)     Chief Complaint  Patient presents with  . Medical Management of Chronic Issues    Routine TVAB visit    HPI:  Pt is an 85 y.o. male seen today for medical management of chronic diseases.  He has a PMH of anxiety, arthritis, DVT, hypertension, and depression.He was seen in his room today. He told me that he no longer takes Sinemet. The neurologist, Dr Sherryll BurgerShah, discontinued Sinemet on 05/15/19. BPs  120/58, 130/64, 126/83. He takes Lisinopril for hypertension.   Past Medical History:  Diagnosis Date  . Anxiety   . Arthritis   . Blood transfusion    hx of autologus transfusion  . Cataract cortical, senile   . Chicken pox   . Clotting disorder (HCC)   . Complete heart block (HCC)   . Depression    unspecified  . DVT (deep venous thrombosis)  (HCC) 09/2014   left leg  . GERD (gastroesophageal reflux disease)   . Hereditary and idiopathic neuropathy   . History of depression   . Hypertension   . MCI (mild cognitive impairment) 10/22/2013  . Memory deficit 03/24/2011  . Memory loss   . Movement disorder   . Neuromuscular disorder (HCC)    peripheral  neuropathy  . Neuropathy   . Osteoarthritis   . Other hammer toe(s) (acquired), unspecified foot   . Pacemaker   . Parkinson's disease (HCC)   . Parkinsonism (HCC) 10/22/2013  . Shortness of breath   . Spinal stenosis   . Synovitis and tenosynovitis, unspecified   . Third degree heart block (HCC)   . Tremor    Past Surgical History:  Procedure Laterality Date  . CATARACT EXTRACTION  06/2005  . COLONOSCOPY  06/13/2006   PH colon polyps (MUS) repeat 7/20  . CORRECTION HAMMER TOE    . defibrilator  04/04/2012   Dual Chamber  . INSERT / REPLACE / REMOVE PACEMAKER  04/04/2012  . JOINT REPLACEMENT Right    THA  . JOINT REPLACEMENT Bilateral    TKA  . KNEE ARTHROSCOPY Bilateral    Right x 2 and Left  . KNEE SURGERY  [phone],1996  . LEAD REVISION N/A 04/05/2012   Procedure: LEAD REVISION;  Surgeon: Duke SalviaSteven C Klein, MD;  [facility];  Service: Cardiovascular;  Laterality: N/A;  . PERMANENT PACEMAKER INSERTION N/A 04/04/2012   Procedure: PERMANENT PACEMAKER INSERTION;  Surgeon: Marinus MawGregg W Taylor, MD;  [facility];  Service: Cardiovascular;  Laterality: N/A;  . REPLACEMENT TOTAL HIP W/  RESURFACING IMPLANTS    . REPLACEMENT TOTAL KNEE BILATERAL  1997  . ROTATOR CUFF REPAIR  06/2008  . ROTATOR CUFF REPAIR Left 2010  . SHOULDER ARTHROSCOPY Left 06/19/2008   WITH SUBACROMIAL DECOMPRESSION. DISTAL CLAVICLE EXCISION. ARTHROSCOPIC ROTATOR CUFF REPAIR.  Marland Kitchen. TOTAL HIP ARTHROPLASTY  2009   right  . UMBILICAL HERNIA REPAIR  2001  . VASECTOMY      Allergies  Allergen Reactions  . Citalopram     Other reaction(s): Unknown  . Codeine Nausea And Vomiting  .  Sertraline     Other reaction(s): Unknown Other reaction(s): Unknown  . Lodine [Etodolac] Rash  . Percocet [Oxycodone-Acetaminophen] Rash    Tolerates tylenol Patient is able to take this recently    Outpatient Encounter Medications as of 05/27/2019  Medication Sig  . acetaminophen (TYLENOL) 325 MG tablet Take 650 mg by mouth 4 (four) times daily as needed for fever. Max of 3000 mg of Apap in 24 hrs  . alum & mag hydroxide-simeth (MAALOX PLUS) 400-400-40 MG/5ML suspension Take 30 mLs by mouth every 4 (four) hours as needed for indigestion.  . bisacodyl (BISCOLAX) 10 MG suppository Place 10 mg rectally 2 (two) times daily as needed for moderate constipation. Give until BM.  . cetirizine (ZYRTEC) 10 MG tablet Take 10 mg by mouth at bedtime.  Marland Kitchen. desvenlafaxine (PRISTIQ) 50 MG 24 hr tablet Take 1 tablet (50 mg total) by mouth daily.  . fluticasone (FLONASE) 50 MCG/ACT nasal spray Place 1 spray into both nostrils daily.  Marland Kitchen. HYDROcodone-acetaminophen (NORCO/VICODIN) 5-325 MG tablet Take 1 tablet by mouth every 12 (twelve) hours as needed for moderate pain.  Marland Kitchen. ipratropium (ATROVENT) 0.03 % nasal spray Place 2 sprays into both nostrils every 12 (twelve) hours.  Marland Kitchen. ipratropium-albuterol (DUONEB) 0.5-2.5 (3) MG/3ML SOLN Take 3 mLs by nebulization every 6 (six) hours as needed (wheezing and shortness of breath with cough).  Marland Kitchen. lisinopril (PRINIVIL,ZESTRIL) 40 MG tablet TAKE ONE TABLET BY MOUTH EVERY DAY  . LORazepam (ATIVAN) 0.5 MG tablet Take 1 tablet (0.5 mg total) by mouth daily. Hold for sedation  . magnesium hydroxide (MILK OF MAGNESIA) 400 MG/5ML suspension Take 30 mLs by mouth every 4 (four) hours as needed. If constipation/ no BM for 2 days  . Melatonin 3 MG TABS Take 1 tablet by mouth at bedtime. Should take 1 - 2 hours before sleep  . ondansetron (ZOFRAN) 4 MG tablet Take 4 mg by mouth every 6 (six) hours as needed for nausea or vomiting.  . OXYGEN Inhale 2 L into the lungs as needed (Dyspnea).   . pantoprazole (PROTONIX) 40 MG tablet Take 40 mg by mouth daily.  . Rivaroxaban (XARELTO) 15 MG TABS tablet Take 15 mg by mouth daily with supper. for anticoagulation. Monitor for bleeding/bruising.  . sennosides-docusate sodium (SENOKOT-S) 8.6-50 MG tablet Take 2 tablets by mouth 2 (two) times daily.   . sodium phosphate (FLEET) enema Place 1 enema rectally every three (3) days as needed. If constipation not relieved by milk of magnesia or bisacodyl suppository. Follow package directions  . sorbitol 70 % solution Take 30 mLs by mouth every 2 (two) hours as needed. Constipation. Give PO Q 2 hours until pt has large BM. Give first dose now along with Bisacodyl suppository  . tamsulosin (FLOMAX) 0.4 MG CAPS capsule Take 0.4 mg by mouth daily after supper. 30 mins after supper  . zinc oxide 20 %  ointment Apply 1 application topically as needed for irritation. Apply liberal amount to areas of skin irritation as needed  . carbidopa-levodopa (SINEMET IR) 25-250 MG tablet Take 1 tablet by mouth 2 (two) times daily for 30 days.   No facility-administered encounter medications on file as of 05/27/2019.     Review of Systems  GENERAL: No fever or chills  MOUTH and THROAT: Denies oral discomfort, gingival pain or bleeding RESPIRATORY: no cough, SOB, DOE, wheezing, hemoptysis CARDIAC: No chest pain, edema or palpitations GI: No abdominal pain, diarrhea, constipation, heart burn, nausea or vomiting GU: Denies dysuria, frequency, hematuria, incontinence, or discharge NEUROLOGICAL: Denies dizziness, syncope, numbness, or headache PSYCHIATRIC: Denies feelings of depression or anxiety. No report of hallucinations, insomnia, paranoia, or agitation .  Immunization History  Administered Date(s) Administered  . Influenza-Unspecified 10/20/2014, 09/10/2016, 09/02/2017, 10/12/2018  . PPD Test 12/05/2015, 11/26/2016, 11/11/2018  . Pneumococcal Polysaccharide-23 03/13/2014  . Pneumococcal-Unspecified 10/20/2014    Pertinent  Health Maintenance Due  Topic Date Due  . PNA vac Low Risk Adult (2 of 2 - PCV13) 05/26/2020 (Originally 10/21/2015)  . INFLUENZA VACCINE  07/13/2019   Fall Risk  10/30/2018  Falls in the past year? 0     Vitals:   05/27/19 2037  BP: (!) 135/40  Pulse: 64  Resp: 20  Temp: 97.7 F (36.5 C)  TempSrc: Oral  SpO2: 98%  Weight: 162 lb 12.8 oz (73.8 kg)  Height: 5\' 10"  (1.778 m)   Body mass index is 23.36 kg/m.  Physical Exam  GENERAL APPEARANCE: Well nourished. In no acute distress. Normal body habitus SKIN:  Skin is warm and dry.  MOUTH and THROAT: Lips are without lesions. Oral mucosa is moist and without lesions. Tongue is normal in shape, size, and color and without lesions RESPIRATORY: Breathing is even & unlabored, BS CTAB CARDIAC: RRR, no murmur,no extra heart sounds, no edema, left chest pacemaker GI: Abdomen soft, normal BS, no masses, no tenderness EXTREMITIES: Able to move X 4 extremities NEUROLOGICAL: There is no tremor. Speech is clear PSYCHIATRIC:  Affect and behavior are appropriate   Labs reviewed: Recent Labs    04/23/19 0459 04/24/19 0522 04/25/19 0459  NA 139 141 141  K 3.9 3.7 3.5  CL 104 107 107  CO2 25 25 26   GLUCOSE 123* 103* 110*  BUN 27* 30* 27*  CREATININE 1.04 1.18 0.95  CALCIUM 8.3* 8.6* 8.3*   Recent Labs    12/27/18 0620 04/22/19 0951 04/23/19 1013 04/24/19 0522  AST 14* 19  --  15  ALT 8 9  --  6  ALKPHOS 64 61  --  52  BILITOT 0.9 2.0* 0.9 0.9  PROT 6.2* 6.3*  --  6.1*  ALBUMIN 3.9 4.0  --  3.4*   Recent Labs    12/27/18 0620 04/22/19 0951 04/23/19 0459 04/24/19 0522 04/25/19 0459  WBC 5.9 11.8* 11.9* 9.6 5.5  NEUTROABS 2.6 9.3*  --  7.2  --   HGB 12.8* 12.7* 11.6* 12.1* 11.4*  HCT 40.4 38.6* 35.4* 37.0* 34.8*  MCV 107.4* 105.2* 104.4* 106.3* 103.9*  PLT 137* 121* 111* 119* 130*   Lab Results  Component Value Date   TSH 1.231 09/13/2018    Assessment/Plan  1. Chronic deep vein thrombosis  (DVT) of proximal vein of left lower extremity (HCC)  -Continue Xarelto 15 mg 1 tab daily  2. Chronic allergic rhinitis -Stable, continue cetirizine 10 mg 1 tab at bedtime and fluticasone 50 mcg/ACT 1 spray to each  nostril daily  3. Essential hypertension -Well-controlled, continue lisinopril 40 mg 1 tab daily  4. Benign prostatic hyperplasia without lower urinary tract symptoms -Denies urinary retention, continue tamsulosin 0.4 mg 1 capsule daily  5. Major depression, recurrent, chronic (HCC) -Mood this is stable, continue Pristiq ER 50 mg 1 tab daily  6. Other chronic pain -Denies pain today, took PRN Norco 6/14 days, continue Norco 5-325 mg 1 tab every 12 hours PRN  7. Parkinson's disease ([place]) -Sinemet was discontinued by neurologist, Dr. Manuella Ghazi  8. Anxiety -Mood stable, continue Ativan 0.5 mg 1 tab daily   Family/ staff Communication:  Discussed plan of care with resident.  Labs/tests ordered:  None  Goals of care:  Long-term care.   Durenda Age, DNP, FNP-BC [facility] [phone] (Monday-Friday 8:00 a.m. - 5:00 p.m.) [phone] (after hours)

## 2019-05-28 DIAGNOSIS — F419 Anxiety disorder, unspecified: Secondary | ICD-10-CM | POA: Insufficient documentation

## 2019-05-28 DIAGNOSIS — N400 Enlarged prostate without lower urinary tract symptoms: Secondary | ICD-10-CM | POA: Insufficient documentation

## 2019-05-30 ENCOUNTER — Other Ambulatory Visit
Admission: RE | Admit: 2019-05-30 | Discharge: 2019-05-30 | Disposition: A | Discharge status: Home / Self Care | Payer: Medicare Other | Source: Ambulatory Visit | Attending: Internal Medicine | Admitting: Internal Medicine

## 2019-05-30 DIAGNOSIS — R410 Disorientation, unspecified: Secondary | ICD-10-CM | POA: Insufficient documentation

## 2019-05-30 DIAGNOSIS — R443 Hallucinations, unspecified: Secondary | ICD-10-CM | POA: Insufficient documentation

## 2019-05-30 LAB — URINALYSIS, COMPLETE (UACMP) WITH MICROSCOPIC
Bacteria, UA: NONE SEEN
Bilirubin Urine: NEGATIVE
Glucose, UA: NEGATIVE mg/dL
Hgb urine dipstick: NEGATIVE
Ketones, ur: NEGATIVE mg/dL
Leukocytes,Ua: NEGATIVE
Nitrite: NEGATIVE
Protein, ur: NEGATIVE mg/dL
Specific Gravity, Urine: 1.013 (ref 1.005–1.030)
Squamous Epithelial / HPF: NONE SEEN (ref 0–5)
pH: 7 (ref 5.0–8.0)

## 2019-05-31 LAB — URINE CULTURE: Culture: <10000 — AB

## 2019-06-12 ENCOUNTER — Encounter
Admission: RE | Admit: 2019-06-12 | Discharge: 2019-06-12 | Disposition: A | Discharge status: Home / Self Care | Payer: Medicare Other | Source: Ambulatory Visit | Attending: Internal Medicine | Admitting: Internal Medicine

## 2019-07-13 ENCOUNTER — Encounter
Admission: RE | Admit: 2019-07-13 | Discharge: 2019-07-13 | Disposition: A | Discharge status: Home / Self Care | Payer: Medicare Other | Source: Ambulatory Visit | Attending: Internal Medicine | Admitting: Internal Medicine

## 2019-08-13 ENCOUNTER — Encounter
Admission: RE | Admit: 2019-08-13 | Discharge: 2019-08-13 | Disposition: A | Discharge status: Home / Self Care | Payer: Medicare Other | Source: Ambulatory Visit | Attending: Internal Medicine | Admitting: Internal Medicine

## 2019-09-12 ENCOUNTER — Encounter
Admission: RE | Admit: 2019-09-12 | Discharge: 2019-09-12 | Disposition: A | Discharge status: Home / Self Care | Payer: Medicare Other | Source: Ambulatory Visit | Attending: Internal Medicine | Admitting: Internal Medicine

## 2019-09-12 ENCOUNTER — Telehealth: Payer: Self-pay | Admitting: Adult Health Nurse Practitioner

## 2019-09-12 NOTE — Telephone Encounter (Signed)
Spoke with DON and SW at facility about patient's current status warranting further palliative services.  Patient had been stable until about a month ago. Treated for aspiration pneumonia in September this year and is now on aspiration precautions.  Will evaluate prior to any changes in palliative services. Amy K. Olena Heckle NP

## 2019-10-13 DEATH — deceased

## 2020-12-22 IMAGING — CR CHEST - 2 VIEW
2 series · 2 of 2 positions shown · non-contrast
Comparison: 10/15/2014

CLINICAL DATA: Right sided facial droop and left arm weakness.

EXAM:
CHEST - 2 VIEW

[chest lat]
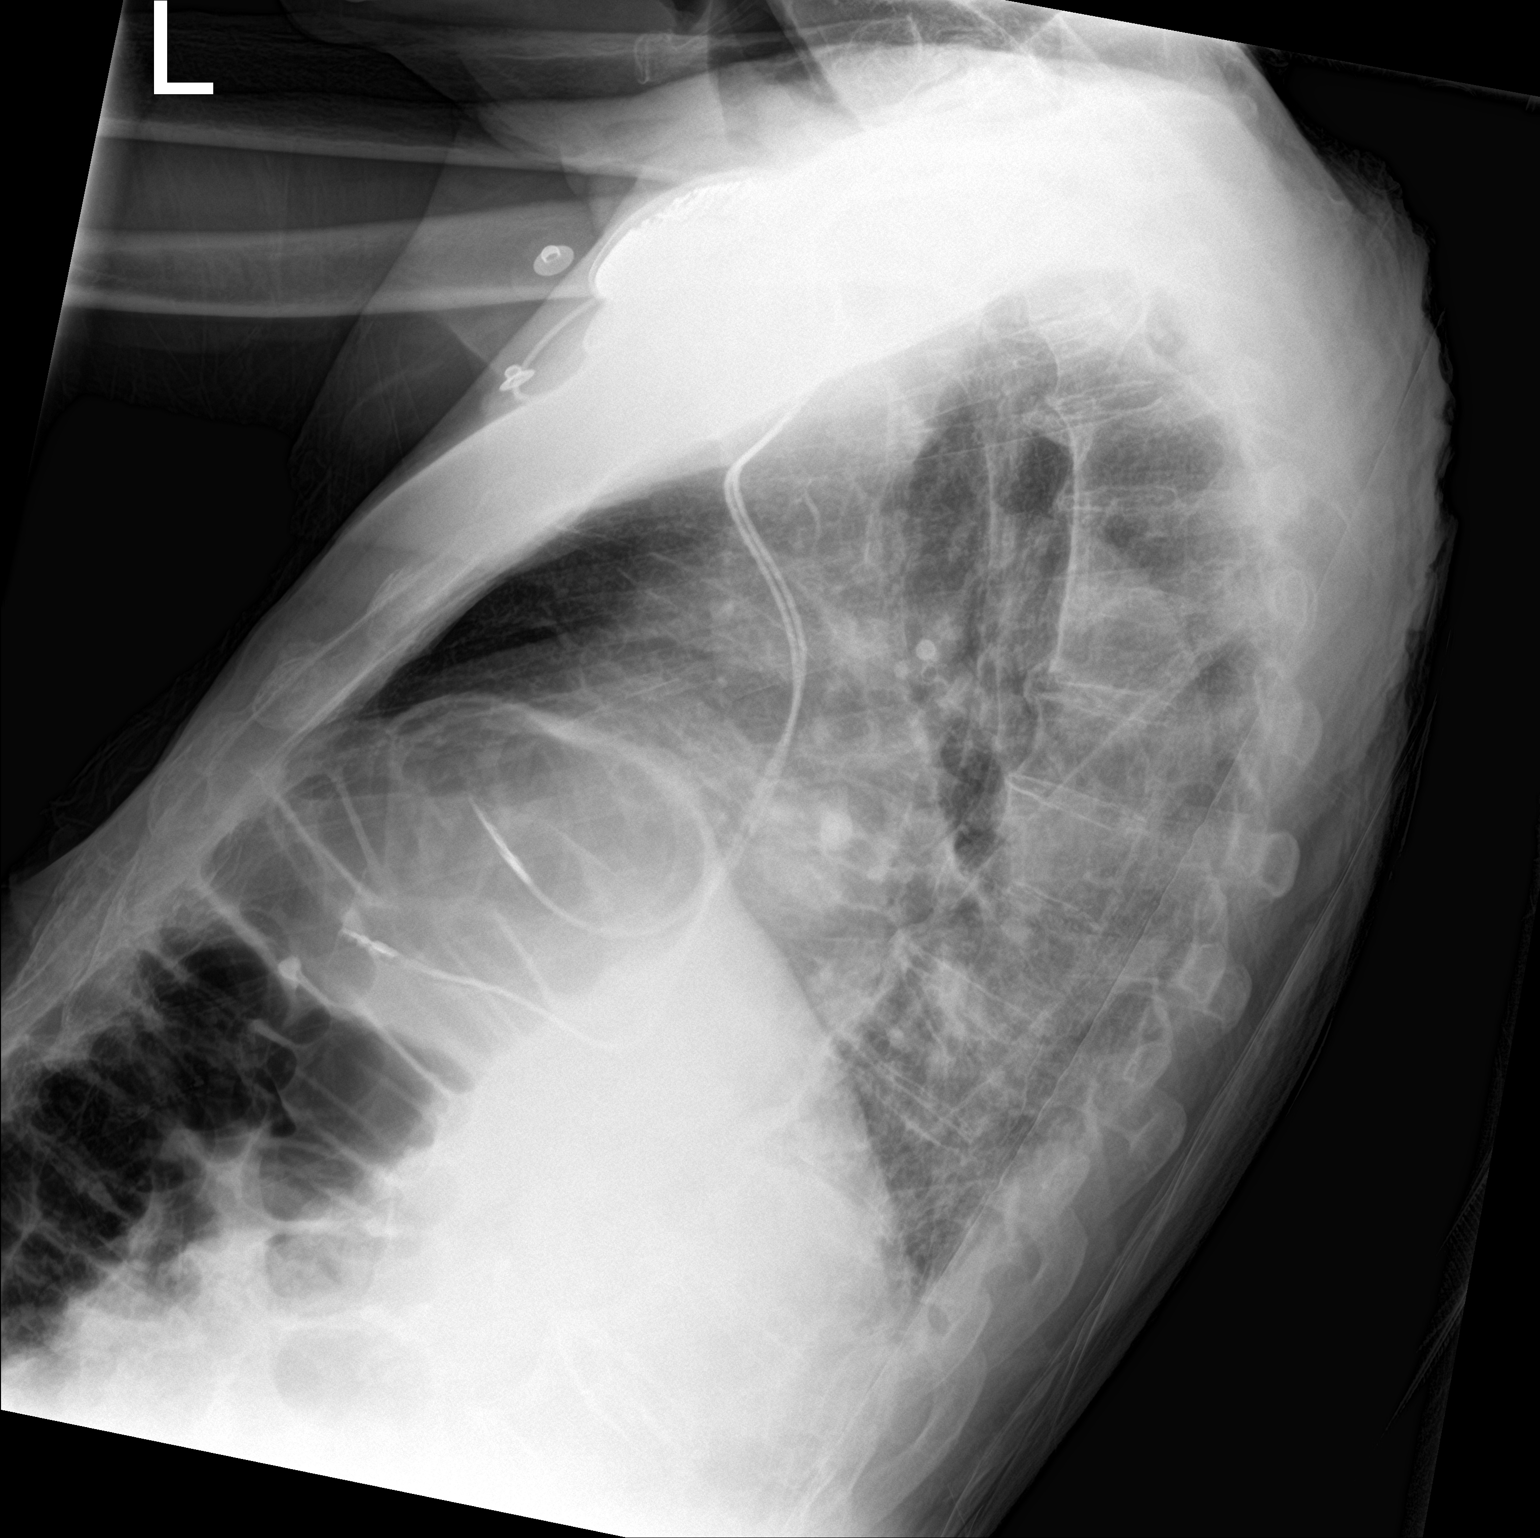

[chest ap]
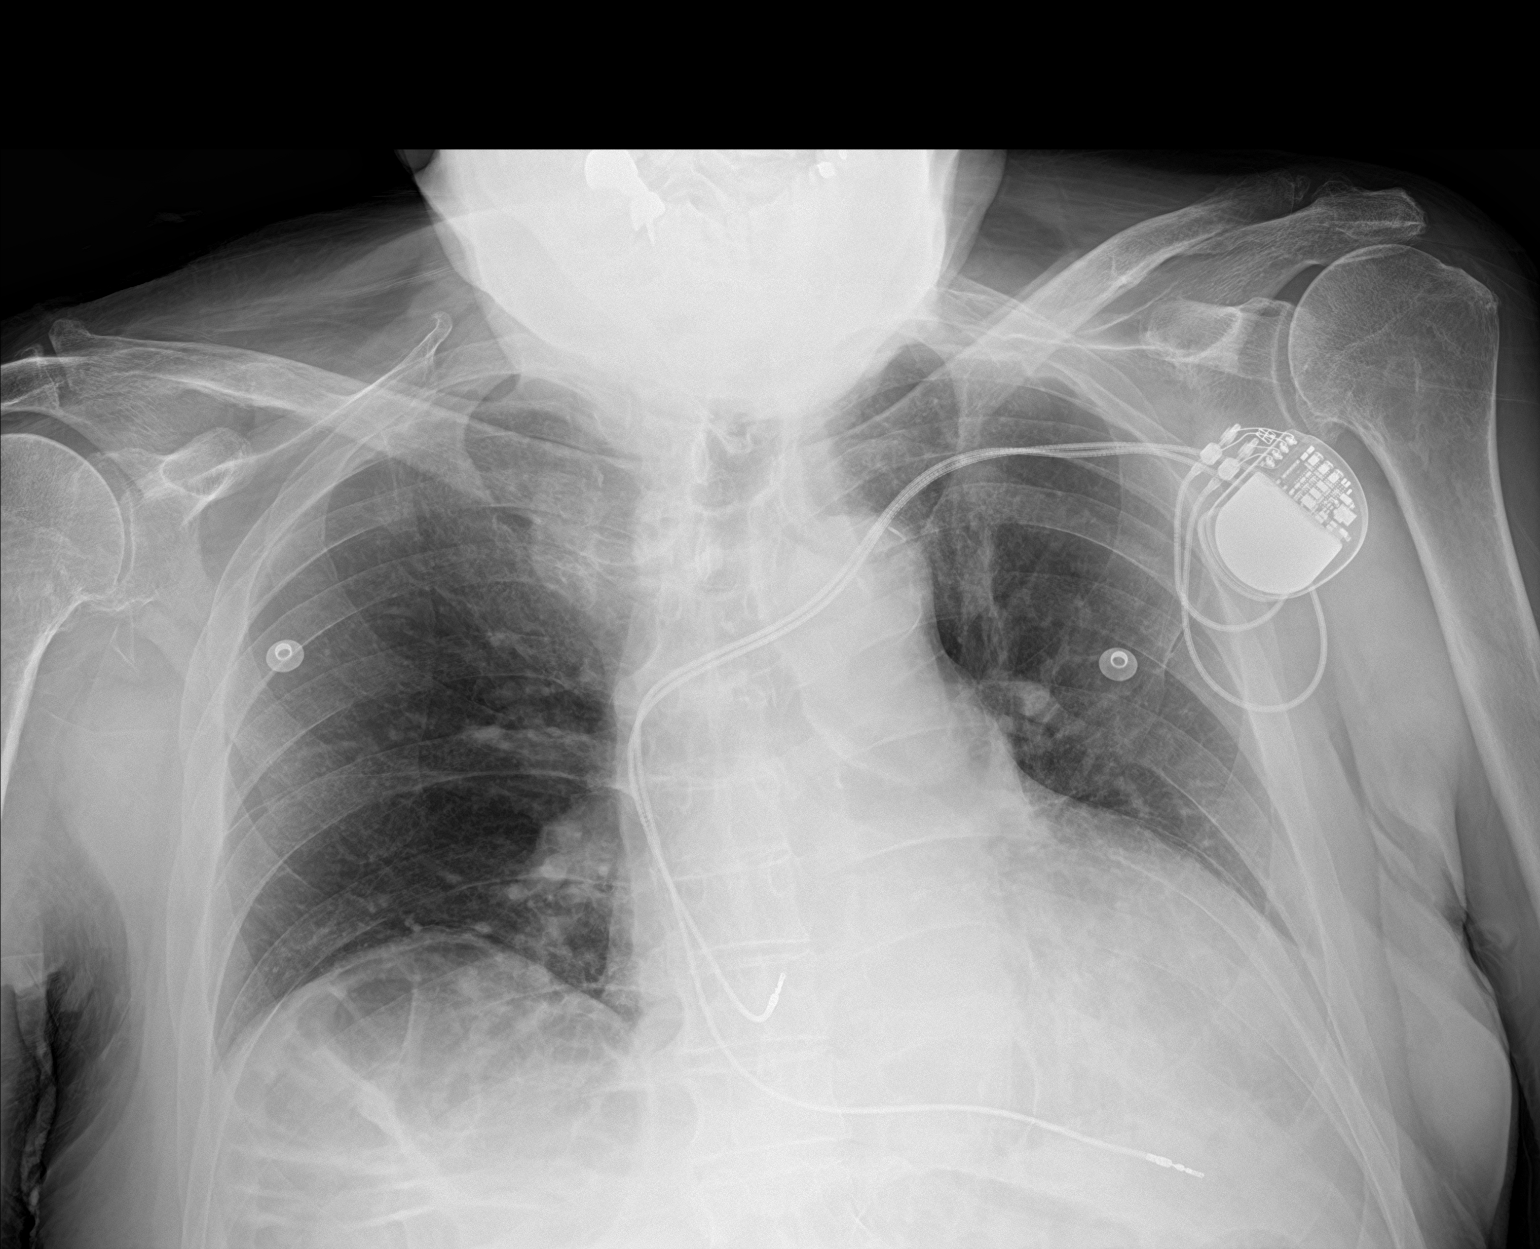

[2 of 2 positions shown; findings below may reference images not displayed]

FINDINGS: Chronic dual lead pacemaker. Chronic cardiomegaly. Chronic aortic
atherosclerosis. Slight worsening of bilateral lower lobe
atelectasis. Patchy basilar pneumonia not excluded. Upper lobes
remain clear.
IMPRESSION: Slight worsening of patchy density at both lung bases that could be
simple atelectasis or mild bibasilar pneumonia.
